# Patient Record
Sex: Male | Born: 1940
Health system: Southern US, Community
[De-identification: ages and names within clinical notes are randomized; demographics above are authoritative.]

## PROBLEM LIST (undated history)

## (undated) DIAGNOSIS — E1129 Type 2 diabetes mellitus with other diabetic kidney complication: Secondary | ICD-10-CM

## (undated) DIAGNOSIS — G629 Polyneuropathy, unspecified: Secondary | ICD-10-CM

## (undated) DIAGNOSIS — N184 Chronic kidney disease, stage 4 (severe): Secondary | ICD-10-CM

## (undated) DIAGNOSIS — Z9989 Dependence on other enabling machines and devices: Secondary | ICD-10-CM

## (undated) DIAGNOSIS — IMO0002 Reserved for concepts with insufficient information to code with codable children: Secondary | ICD-10-CM

## (undated) DIAGNOSIS — R0600 Dyspnea, unspecified: Secondary | ICD-10-CM

## (undated) DIAGNOSIS — M1990 Unspecified osteoarthritis, unspecified site: Secondary | ICD-10-CM

## (undated) DIAGNOSIS — G4733 Obstructive sleep apnea (adult) (pediatric): Secondary | ICD-10-CM

## (undated) DIAGNOSIS — T8859XA Other complications of anesthesia, initial encounter: Secondary | ICD-10-CM

## (undated) DIAGNOSIS — E669 Obesity, unspecified: Secondary | ICD-10-CM

## (undated) DIAGNOSIS — K5900 Constipation, unspecified: Secondary | ICD-10-CM

## (undated) DIAGNOSIS — E039 Hypothyroidism, unspecified: Secondary | ICD-10-CM

## (undated) DIAGNOSIS — M797 Fibromyalgia: Secondary | ICD-10-CM

## (undated) DIAGNOSIS — I82409 Acute embolism and thrombosis of unspecified deep veins of unspecified lower extremity: Secondary | ICD-10-CM

## (undated) DIAGNOSIS — I10 Essential (primary) hypertension: Secondary | ICD-10-CM

## (undated) DIAGNOSIS — T4145XA Adverse effect of unspecified anesthetic, initial encounter: Secondary | ICD-10-CM

## (undated) DIAGNOSIS — E1165 Type 2 diabetes mellitus with hyperglycemia: Secondary | ICD-10-CM

## (undated) DIAGNOSIS — R5382 Chronic fatigue, unspecified: Secondary | ICD-10-CM

## (undated) HISTORY — PX: THYROIDECTOMY, PARTIAL: SHX18

## (undated) HISTORY — PX: LUMBAR FUSION: SHX111

## (undated) HISTORY — PX: ROTATOR CUFF REPAIR: SHX139

## (undated) HISTORY — PX: CERVICAL LAMINECTOMY: SHX94

---

## 2011-09-24 DIAGNOSIS — E039 Hypothyroidism, unspecified: Secondary | ICD-10-CM | POA: Diagnosis not present

## 2011-09-24 DIAGNOSIS — E1149 Type 2 diabetes mellitus with other diabetic neurological complication: Secondary | ICD-10-CM | POA: Diagnosis not present

## 2011-09-24 DIAGNOSIS — N184 Chronic kidney disease, stage 4 (severe): Secondary | ICD-10-CM | POA: Diagnosis not present

## 2011-09-24 DIAGNOSIS — Z9641 Presence of insulin pump (external) (internal): Secondary | ICD-10-CM | POA: Diagnosis not present

## 2011-10-24 DIAGNOSIS — N184 Chronic kidney disease, stage 4 (severe): Secondary | ICD-10-CM | POA: Diagnosis not present

## 2011-11-19 DIAGNOSIS — N189 Chronic kidney disease, unspecified: Secondary | ICD-10-CM | POA: Diagnosis not present

## 2011-11-19 DIAGNOSIS — Z5181 Encounter for therapeutic drug level monitoring: Secondary | ICD-10-CM | POA: Diagnosis not present

## 2011-11-19 DIAGNOSIS — E119 Type 2 diabetes mellitus without complications: Secondary | ICD-10-CM | POA: Diagnosis not present

## 2011-11-19 DIAGNOSIS — I10 Essential (primary) hypertension: Secondary | ICD-10-CM | POA: Diagnosis not present

## 2011-11-19 DIAGNOSIS — Z79899 Other long term (current) drug therapy: Secondary | ICD-10-CM | POA: Diagnosis not present

## 2011-12-08 DIAGNOSIS — G4733 Obstructive sleep apnea (adult) (pediatric): Secondary | ICD-10-CM | POA: Diagnosis not present

## 2011-12-24 DIAGNOSIS — I10 Essential (primary) hypertension: Secondary | ICD-10-CM | POA: Diagnosis not present

## 2011-12-24 DIAGNOSIS — IMO0001 Reserved for inherently not codable concepts without codable children: Secondary | ICD-10-CM | POA: Diagnosis not present

## 2011-12-24 DIAGNOSIS — E782 Mixed hyperlipidemia: Secondary | ICD-10-CM | POA: Diagnosis not present

## 2012-01-21 DIAGNOSIS — E1149 Type 2 diabetes mellitus with other diabetic neurological complication: Secondary | ICD-10-CM | POA: Diagnosis not present

## 2012-01-21 DIAGNOSIS — L608 Other nail disorders: Secondary | ICD-10-CM | POA: Diagnosis not present

## 2012-01-23 DIAGNOSIS — N184 Chronic kidney disease, stage 4 (severe): Secondary | ICD-10-CM | POA: Diagnosis not present

## 2012-01-23 DIAGNOSIS — I129 Hypertensive chronic kidney disease with stage 1 through stage 4 chronic kidney disease, or unspecified chronic kidney disease: Secondary | ICD-10-CM | POA: Diagnosis not present

## 2012-01-23 DIAGNOSIS — E119 Type 2 diabetes mellitus without complications: Secondary | ICD-10-CM | POA: Diagnosis not present

## 2012-03-11 DIAGNOSIS — G4733 Obstructive sleep apnea (adult) (pediatric): Secondary | ICD-10-CM | POA: Diagnosis not present

## 2012-03-11 DIAGNOSIS — I10 Essential (primary) hypertension: Secondary | ICD-10-CM | POA: Diagnosis not present

## 2012-03-11 DIAGNOSIS — N189 Chronic kidney disease, unspecified: Secondary | ICD-10-CM | POA: Diagnosis not present

## 2012-03-24 DIAGNOSIS — E1129 Type 2 diabetes mellitus with other diabetic kidney complication: Secondary | ICD-10-CM | POA: Diagnosis not present

## 2012-03-24 DIAGNOSIS — E1165 Type 2 diabetes mellitus with hyperglycemia: Secondary | ICD-10-CM | POA: Diagnosis not present

## 2012-03-24 DIAGNOSIS — Z9641 Presence of insulin pump (external) (internal): Secondary | ICD-10-CM | POA: Diagnosis not present

## 2012-03-24 DIAGNOSIS — E039 Hypothyroidism, unspecified: Secondary | ICD-10-CM | POA: Diagnosis not present

## 2012-03-24 DIAGNOSIS — N184 Chronic kidney disease, stage 4 (severe): Secondary | ICD-10-CM | POA: Diagnosis not present

## 2012-04-09 DIAGNOSIS — H40009 Preglaucoma, unspecified, unspecified eye: Secondary | ICD-10-CM | POA: Diagnosis not present

## 2012-04-15 DIAGNOSIS — M545 Low back pain, unspecified: Secondary | ICD-10-CM | POA: Diagnosis not present

## 2012-04-15 DIAGNOSIS — M79609 Pain in unspecified limb: Secondary | ICD-10-CM | POA: Diagnosis not present

## 2012-04-15 DIAGNOSIS — M5137 Other intervertebral disc degeneration, lumbosacral region: Secondary | ICD-10-CM | POA: Diagnosis not present

## 2012-04-15 DIAGNOSIS — M48061 Spinal stenosis, lumbar region without neurogenic claudication: Secondary | ICD-10-CM | POA: Diagnosis not present

## 2012-04-15 DIAGNOSIS — M47817 Spondylosis without myelopathy or radiculopathy, lumbosacral region: Secondary | ICD-10-CM | POA: Diagnosis not present

## 2012-05-26 DIAGNOSIS — M545 Low back pain, unspecified: Secondary | ICD-10-CM | POA: Diagnosis not present

## 2012-05-26 DIAGNOSIS — E119 Type 2 diabetes mellitus without complications: Secondary | ICD-10-CM | POA: Diagnosis not present

## 2012-05-26 DIAGNOSIS — N189 Chronic kidney disease, unspecified: Secondary | ICD-10-CM | POA: Diagnosis not present

## 2012-05-26 DIAGNOSIS — J309 Allergic rhinitis, unspecified: Secondary | ICD-10-CM | POA: Diagnosis not present

## 2012-06-24 DIAGNOSIS — E1129 Type 2 diabetes mellitus with other diabetic kidney complication: Secondary | ICD-10-CM | POA: Diagnosis not present

## 2012-06-24 DIAGNOSIS — E1165 Type 2 diabetes mellitus with hyperglycemia: Secondary | ICD-10-CM | POA: Diagnosis not present

## 2012-06-24 DIAGNOSIS — E039 Hypothyroidism, unspecified: Secondary | ICD-10-CM | POA: Diagnosis not present

## 2012-06-24 DIAGNOSIS — E782 Mixed hyperlipidemia: Secondary | ICD-10-CM | POA: Diagnosis not present

## 2012-06-24 DIAGNOSIS — I10 Essential (primary) hypertension: Secondary | ICD-10-CM | POA: Diagnosis not present

## 2012-06-30 DIAGNOSIS — N184 Chronic kidney disease, stage 4 (severe): Secondary | ICD-10-CM | POA: Diagnosis not present

## 2012-07-07 DIAGNOSIS — N184 Chronic kidney disease, stage 4 (severe): Secondary | ICD-10-CM | POA: Diagnosis not present

## 2012-07-28 DIAGNOSIS — E1149 Type 2 diabetes mellitus with other diabetic neurological complication: Secondary | ICD-10-CM | POA: Diagnosis not present

## 2012-07-28 DIAGNOSIS — L608 Other nail disorders: Secondary | ICD-10-CM | POA: Diagnosis not present

## 2012-08-05 DIAGNOSIS — Z23 Encounter for immunization: Secondary | ICD-10-CM | POA: Diagnosis not present

## 2012-08-20 DIAGNOSIS — N184 Chronic kidney disease, stage 4 (severe): Secondary | ICD-10-CM | POA: Diagnosis not present

## 2012-08-27 DIAGNOSIS — E119 Type 2 diabetes mellitus without complications: Secondary | ICD-10-CM | POA: Diagnosis not present

## 2012-08-27 DIAGNOSIS — N184 Chronic kidney disease, stage 4 (severe): Secondary | ICD-10-CM | POA: Diagnosis not present

## 2012-08-27 DIAGNOSIS — I129 Hypertensive chronic kidney disease with stage 1 through stage 4 chronic kidney disease, or unspecified chronic kidney disease: Secondary | ICD-10-CM | POA: Diagnosis not present

## 2012-09-08 DIAGNOSIS — Z23 Encounter for immunization: Secondary | ICD-10-CM | POA: Diagnosis not present

## 2012-09-25 DIAGNOSIS — E782 Mixed hyperlipidemia: Secondary | ICD-10-CM | POA: Diagnosis not present

## 2012-09-25 DIAGNOSIS — E15 Nondiabetic hypoglycemic coma: Secondary | ICD-10-CM | POA: Diagnosis not present

## 2012-09-25 DIAGNOSIS — E1129 Type 2 diabetes mellitus with other diabetic kidney complication: Secondary | ICD-10-CM | POA: Diagnosis not present

## 2012-09-25 DIAGNOSIS — E1149 Type 2 diabetes mellitus with other diabetic neurological complication: Secondary | ICD-10-CM | POA: Diagnosis not present

## 2012-09-25 DIAGNOSIS — N184 Chronic kidney disease, stage 4 (severe): Secondary | ICD-10-CM | POA: Diagnosis not present

## 2012-09-25 DIAGNOSIS — I10 Essential (primary) hypertension: Secondary | ICD-10-CM | POA: Diagnosis not present

## 2012-09-25 DIAGNOSIS — E1165 Type 2 diabetes mellitus with hyperglycemia: Secondary | ICD-10-CM | POA: Diagnosis not present

## 2012-09-25 DIAGNOSIS — E039 Hypothyroidism, unspecified: Secondary | ICD-10-CM | POA: Diagnosis not present

## 2012-10-10 DIAGNOSIS — H2510 Age-related nuclear cataract, unspecified eye: Secondary | ICD-10-CM | POA: Diagnosis not present

## 2012-10-10 DIAGNOSIS — H18599 Other hereditary corneal dystrophies, unspecified eye: Secondary | ICD-10-CM | POA: Diagnosis not present

## 2012-10-10 DIAGNOSIS — H40009 Preglaucoma, unspecified, unspecified eye: Secondary | ICD-10-CM | POA: Diagnosis not present

## 2012-10-10 DIAGNOSIS — E109 Type 1 diabetes mellitus without complications: Secondary | ICD-10-CM | POA: Diagnosis not present

## 2012-11-17 DIAGNOSIS — N184 Chronic kidney disease, stage 4 (severe): Secondary | ICD-10-CM | POA: Diagnosis not present

## 2012-11-25 DIAGNOSIS — G4700 Insomnia, unspecified: Secondary | ICD-10-CM | POA: Diagnosis not present

## 2012-11-25 DIAGNOSIS — M545 Low back pain, unspecified: Secondary | ICD-10-CM | POA: Diagnosis not present

## 2012-11-25 DIAGNOSIS — E119 Type 2 diabetes mellitus without complications: Secondary | ICD-10-CM | POA: Diagnosis not present

## 2012-11-26 DIAGNOSIS — I129 Hypertensive chronic kidney disease with stage 1 through stage 4 chronic kidney disease, or unspecified chronic kidney disease: Secondary | ICD-10-CM | POA: Diagnosis not present

## 2012-11-26 DIAGNOSIS — N184 Chronic kidney disease, stage 4 (severe): Secondary | ICD-10-CM | POA: Diagnosis not present

## 2012-11-26 DIAGNOSIS — E119 Type 2 diabetes mellitus without complications: Secondary | ICD-10-CM | POA: Diagnosis not present

## 2012-12-25 DIAGNOSIS — E1149 Type 2 diabetes mellitus with other diabetic neurological complication: Secondary | ICD-10-CM | POA: Diagnosis not present

## 2012-12-25 DIAGNOSIS — N184 Chronic kidney disease, stage 4 (severe): Secondary | ICD-10-CM | POA: Diagnosis not present

## 2012-12-25 DIAGNOSIS — E039 Hypothyroidism, unspecified: Secondary | ICD-10-CM | POA: Diagnosis not present

## 2013-01-26 DIAGNOSIS — E1149 Type 2 diabetes mellitus with other diabetic neurological complication: Secondary | ICD-10-CM | POA: Diagnosis not present

## 2013-01-26 DIAGNOSIS — L608 Other nail disorders: Secondary | ICD-10-CM | POA: Diagnosis not present

## 2013-02-02 DIAGNOSIS — Z23 Encounter for immunization: Secondary | ICD-10-CM | POA: Diagnosis not present

## 2013-03-04 DIAGNOSIS — N184 Chronic kidney disease, stage 4 (severe): Secondary | ICD-10-CM | POA: Diagnosis not present

## 2013-03-11 DIAGNOSIS — N184 Chronic kidney disease, stage 4 (severe): Secondary | ICD-10-CM | POA: Diagnosis not present

## 2013-03-27 DIAGNOSIS — N052 Unspecified nephritic syndrome with diffuse membranous glomerulonephritis: Secondary | ICD-10-CM | POA: Diagnosis not present

## 2013-03-27 DIAGNOSIS — E15 Nondiabetic hypoglycemic coma: Secondary | ICD-10-CM | POA: Diagnosis not present

## 2013-03-27 DIAGNOSIS — Z9641 Presence of insulin pump (external) (internal): Secondary | ICD-10-CM | POA: Diagnosis not present

## 2013-03-27 DIAGNOSIS — N183 Chronic kidney disease, stage 3 unspecified: Secondary | ICD-10-CM | POA: Diagnosis not present

## 2013-03-27 DIAGNOSIS — E1165 Type 2 diabetes mellitus with hyperglycemia: Secondary | ICD-10-CM | POA: Diagnosis not present

## 2013-03-27 DIAGNOSIS — E109 Type 1 diabetes mellitus without complications: Secondary | ICD-10-CM | POA: Diagnosis not present

## 2013-03-27 DIAGNOSIS — E1129 Type 2 diabetes mellitus with other diabetic kidney complication: Secondary | ICD-10-CM | POA: Diagnosis not present

## 2013-04-17 DIAGNOSIS — H2510 Age-related nuclear cataract, unspecified eye: Secondary | ICD-10-CM | POA: Diagnosis not present

## 2013-04-17 DIAGNOSIS — H18599 Other hereditary corneal dystrophies, unspecified eye: Secondary | ICD-10-CM | POA: Diagnosis not present

## 2013-04-17 DIAGNOSIS — E109 Type 1 diabetes mellitus without complications: Secondary | ICD-10-CM | POA: Diagnosis not present

## 2013-04-17 DIAGNOSIS — H40009 Preglaucoma, unspecified, unspecified eye: Secondary | ICD-10-CM | POA: Diagnosis not present

## 2013-05-14 DIAGNOSIS — N189 Chronic kidney disease, unspecified: Secondary | ICD-10-CM | POA: Diagnosis not present

## 2013-05-14 DIAGNOSIS — E119 Type 2 diabetes mellitus without complications: Secondary | ICD-10-CM | POA: Diagnosis not present

## 2013-05-14 DIAGNOSIS — M479 Spondylosis, unspecified: Secondary | ICD-10-CM | POA: Diagnosis not present

## 2013-05-14 DIAGNOSIS — G4733 Obstructive sleep apnea (adult) (pediatric): Secondary | ICD-10-CM | POA: Diagnosis not present

## 2013-05-28 DIAGNOSIS — R5383 Other fatigue: Secondary | ICD-10-CM | POA: Diagnosis not present

## 2013-05-28 DIAGNOSIS — M479 Spondylosis, unspecified: Secondary | ICD-10-CM | POA: Diagnosis not present

## 2013-05-28 DIAGNOSIS — G8929 Other chronic pain: Secondary | ICD-10-CM | POA: Diagnosis not present

## 2013-05-28 DIAGNOSIS — R5381 Other malaise: Secondary | ICD-10-CM | POA: Diagnosis not present

## 2013-07-02 DIAGNOSIS — E1165 Type 2 diabetes mellitus with hyperglycemia: Secondary | ICD-10-CM | POA: Diagnosis not present

## 2013-07-02 DIAGNOSIS — N184 Chronic kidney disease, stage 4 (severe): Secondary | ICD-10-CM | POA: Diagnosis not present

## 2013-07-02 DIAGNOSIS — E039 Hypothyroidism, unspecified: Secondary | ICD-10-CM | POA: Diagnosis not present

## 2013-07-02 DIAGNOSIS — Z9641 Presence of insulin pump (external) (internal): Secondary | ICD-10-CM | POA: Diagnosis not present

## 2013-07-02 DIAGNOSIS — E1129 Type 2 diabetes mellitus with other diabetic kidney complication: Secondary | ICD-10-CM | POA: Diagnosis not present

## 2013-07-14 DIAGNOSIS — G8929 Other chronic pain: Secondary | ICD-10-CM | POA: Diagnosis not present

## 2013-07-14 DIAGNOSIS — Z79899 Other long term (current) drug therapy: Secondary | ICD-10-CM | POA: Diagnosis not present

## 2013-07-14 DIAGNOSIS — Z23 Encounter for immunization: Secondary | ICD-10-CM | POA: Diagnosis not present

## 2013-08-10 DIAGNOSIS — L608 Other nail disorders: Secondary | ICD-10-CM | POA: Diagnosis not present

## 2013-08-10 DIAGNOSIS — E1149 Type 2 diabetes mellitus with other diabetic neurological complication: Secondary | ICD-10-CM | POA: Diagnosis not present

## 2013-08-24 DIAGNOSIS — M479 Spondylosis, unspecified: Secondary | ICD-10-CM | POA: Diagnosis not present

## 2013-08-24 DIAGNOSIS — G8929 Other chronic pain: Secondary | ICD-10-CM | POA: Diagnosis not present

## 2013-09-18 DIAGNOSIS — N184 Chronic kidney disease, stage 4 (severe): Secondary | ICD-10-CM | POA: Diagnosis not present

## 2013-09-23 DIAGNOSIS — N184 Chronic kidney disease, stage 4 (severe): Secondary | ICD-10-CM | POA: Diagnosis not present

## 2013-09-23 DIAGNOSIS — E119 Type 2 diabetes mellitus without complications: Secondary | ICD-10-CM | POA: Diagnosis not present

## 2013-09-23 DIAGNOSIS — I129 Hypertensive chronic kidney disease with stage 1 through stage 4 chronic kidney disease, or unspecified chronic kidney disease: Secondary | ICD-10-CM | POA: Diagnosis not present

## 2013-10-01 DIAGNOSIS — I10 Essential (primary) hypertension: Secondary | ICD-10-CM | POA: Diagnosis not present

## 2013-10-01 DIAGNOSIS — N184 Chronic kidney disease, stage 4 (severe): Secondary | ICD-10-CM | POA: Diagnosis not present

## 2013-10-01 DIAGNOSIS — E1165 Type 2 diabetes mellitus with hyperglycemia: Secondary | ICD-10-CM | POA: Diagnosis not present

## 2013-10-01 DIAGNOSIS — E039 Hypothyroidism, unspecified: Secondary | ICD-10-CM | POA: Diagnosis not present

## 2013-10-01 DIAGNOSIS — E782 Mixed hyperlipidemia: Secondary | ICD-10-CM | POA: Diagnosis not present

## 2013-10-01 DIAGNOSIS — Z9641 Presence of insulin pump (external) (internal): Secondary | ICD-10-CM | POA: Diagnosis not present

## 2013-10-01 DIAGNOSIS — E1129 Type 2 diabetes mellitus with other diabetic kidney complication: Secondary | ICD-10-CM | POA: Diagnosis not present

## 2013-10-29 DIAGNOSIS — E039 Hypothyroidism, unspecified: Secondary | ICD-10-CM | POA: Diagnosis not present

## 2013-10-29 DIAGNOSIS — N183 Chronic kidney disease, stage 3 unspecified: Secondary | ICD-10-CM | POA: Diagnosis not present

## 2013-10-29 DIAGNOSIS — M5030 Other cervical disc degeneration, unspecified cervical region: Secondary | ICD-10-CM | POA: Diagnosis not present

## 2013-10-29 DIAGNOSIS — R5382 Chronic fatigue, unspecified: Secondary | ICD-10-CM | POA: Diagnosis not present

## 2013-10-29 DIAGNOSIS — M51379 Other intervertebral disc degeneration, lumbosacral region without mention of lumbar back pain or lower extremity pain: Secondary | ICD-10-CM | POA: Diagnosis not present

## 2013-10-29 DIAGNOSIS — G4733 Obstructive sleep apnea (adult) (pediatric): Secondary | ICD-10-CM | POA: Diagnosis not present

## 2013-10-29 DIAGNOSIS — G9332 Myalgic encephalomyelitis/chronic fatigue syndrome: Secondary | ICD-10-CM | POA: Diagnosis not present

## 2013-10-29 DIAGNOSIS — M5137 Other intervertebral disc degeneration, lumbosacral region: Secondary | ICD-10-CM | POA: Diagnosis not present

## 2013-10-29 DIAGNOSIS — I10 Essential (primary) hypertension: Secondary | ICD-10-CM | POA: Diagnosis not present

## 2013-10-29 DIAGNOSIS — E1129 Type 2 diabetes mellitus with other diabetic kidney complication: Secondary | ICD-10-CM | POA: Diagnosis not present

## 2014-01-11 DIAGNOSIS — I10 Essential (primary) hypertension: Secondary | ICD-10-CM | POA: Diagnosis not present

## 2014-01-11 DIAGNOSIS — R5381 Other malaise: Secondary | ICD-10-CM | POA: Diagnosis not present

## 2014-01-11 DIAGNOSIS — E1129 Type 2 diabetes mellitus with other diabetic kidney complication: Secondary | ICD-10-CM | POA: Diagnosis not present

## 2014-01-11 DIAGNOSIS — E039 Hypothyroidism, unspecified: Secondary | ICD-10-CM | POA: Diagnosis not present

## 2014-01-11 DIAGNOSIS — E1165 Type 2 diabetes mellitus with hyperglycemia: Secondary | ICD-10-CM | POA: Diagnosis not present

## 2014-01-11 DIAGNOSIS — R5383 Other fatigue: Secondary | ICD-10-CM | POA: Diagnosis not present

## 2014-03-25 DIAGNOSIS — N184 Chronic kidney disease, stage 4 (severe): Secondary | ICD-10-CM | POA: Diagnosis not present

## 2014-03-29 DIAGNOSIS — E1149 Type 2 diabetes mellitus with other diabetic neurological complication: Secondary | ICD-10-CM | POA: Diagnosis not present

## 2014-03-29 DIAGNOSIS — L608 Other nail disorders: Secondary | ICD-10-CM | POA: Diagnosis not present

## 2014-03-31 DIAGNOSIS — N2581 Secondary hyperparathyroidism of renal origin: Secondary | ICD-10-CM | POA: Diagnosis not present

## 2014-03-31 DIAGNOSIS — N390 Urinary tract infection, site not specified: Secondary | ICD-10-CM | POA: Diagnosis not present

## 2014-03-31 DIAGNOSIS — N184 Chronic kidney disease, stage 4 (severe): Secondary | ICD-10-CM | POA: Diagnosis not present

## 2014-03-31 DIAGNOSIS — E119 Type 2 diabetes mellitus without complications: Secondary | ICD-10-CM | POA: Diagnosis not present

## 2014-03-31 DIAGNOSIS — I129 Hypertensive chronic kidney disease with stage 1 through stage 4 chronic kidney disease, or unspecified chronic kidney disease: Secondary | ICD-10-CM | POA: Diagnosis not present

## 2014-04-12 DIAGNOSIS — E1129 Type 2 diabetes mellitus with other diabetic kidney complication: Secondary | ICD-10-CM | POA: Diagnosis not present

## 2014-04-12 DIAGNOSIS — E1165 Type 2 diabetes mellitus with hyperglycemia: Secondary | ICD-10-CM | POA: Diagnosis not present

## 2014-04-12 DIAGNOSIS — E039 Hypothyroidism, unspecified: Secondary | ICD-10-CM | POA: Diagnosis not present

## 2014-04-12 DIAGNOSIS — I10 Essential (primary) hypertension: Secondary | ICD-10-CM | POA: Diagnosis not present

## 2014-04-13 ENCOUNTER — Inpatient Hospital Stay (HOSPITAL_COMMUNITY): Payer: Medicare Other

## 2014-04-13 ENCOUNTER — Inpatient Hospital Stay (HOSPITAL_COMMUNITY)
Admission: EM | Admit: 2014-04-13 | Discharge: 2014-04-16 | DRG: 176 | Disposition: A | Discharge status: Home / Self Care | Payer: Medicare Other | Attending: Internal Medicine | Admitting: Internal Medicine

## 2014-04-13 ENCOUNTER — Emergency Department (HOSPITAL_COMMUNITY): Payer: Medicare Other

## 2014-04-13 ENCOUNTER — Encounter (HOSPITAL_COMMUNITY): Payer: Self-pay | Admitting: Emergency Medicine

## 2014-04-13 DIAGNOSIS — R7989 Other specified abnormal findings of blood chemistry: Secondary | ICD-10-CM

## 2014-04-13 DIAGNOSIS — R0902 Hypoxemia: Secondary | ICD-10-CM

## 2014-04-13 DIAGNOSIS — N184 Chronic kidney disease, stage 4 (severe): Secondary | ICD-10-CM | POA: Diagnosis not present

## 2014-04-13 DIAGNOSIS — E1129 Type 2 diabetes mellitus with other diabetic kidney complication: Secondary | ICD-10-CM | POA: Diagnosis present

## 2014-04-13 DIAGNOSIS — I824Z9 Acute embolism and thrombosis of unspecified deep veins of unspecified distal lower extremity: Secondary | ICD-10-CM | POA: Diagnosis not present

## 2014-04-13 DIAGNOSIS — I369 Nonrheumatic tricuspid valve disorder, unspecified: Secondary | ICD-10-CM | POA: Diagnosis not present

## 2014-04-13 DIAGNOSIS — I129 Hypertensive chronic kidney disease with stage 1 through stage 4 chronic kidney disease, or unspecified chronic kidney disease: Secondary | ICD-10-CM | POA: Diagnosis present

## 2014-04-13 DIAGNOSIS — IMO0002 Reserved for concepts with insufficient information to code with codable children: Secondary | ICD-10-CM

## 2014-04-13 DIAGNOSIS — I2699 Other pulmonary embolism without acute cor pulmonale: Secondary | ICD-10-CM | POA: Diagnosis not present

## 2014-04-13 DIAGNOSIS — G4733 Obstructive sleep apnea (adult) (pediatric): Secondary | ICD-10-CM | POA: Diagnosis present

## 2014-04-13 DIAGNOSIS — Z794 Long term (current) use of insulin: Secondary | ICD-10-CM | POA: Diagnosis not present

## 2014-04-13 DIAGNOSIS — R0602 Shortness of breath: Secondary | ICD-10-CM

## 2014-04-13 DIAGNOSIS — Z87891 Personal history of nicotine dependence: Secondary | ICD-10-CM | POA: Diagnosis not present

## 2014-04-13 DIAGNOSIS — Z9989 Dependence on other enabling machines and devices: Secondary | ICD-10-CM

## 2014-04-13 DIAGNOSIS — E1165 Type 2 diabetes mellitus with hyperglycemia: Secondary | ICD-10-CM | POA: Diagnosis present

## 2014-04-13 DIAGNOSIS — N179 Acute kidney failure, unspecified: Secondary | ICD-10-CM

## 2014-04-13 DIAGNOSIS — R5381 Other malaise: Secondary | ICD-10-CM | POA: Diagnosis present

## 2014-04-13 DIAGNOSIS — E118 Type 2 diabetes mellitus with unspecified complications: Secondary | ICD-10-CM | POA: Diagnosis present

## 2014-04-13 DIAGNOSIS — R5383 Other fatigue: Secondary | ICD-10-CM

## 2014-04-13 DIAGNOSIS — I82441 Acute embolism and thrombosis of right tibial vein: Secondary | ICD-10-CM

## 2014-04-13 DIAGNOSIS — R791 Abnormal coagulation profile: Secondary | ICD-10-CM | POA: Diagnosis not present

## 2014-04-13 DIAGNOSIS — Z6841 Body Mass Index (BMI) 40.0 and over, adult: Secondary | ICD-10-CM

## 2014-04-13 DIAGNOSIS — R918 Other nonspecific abnormal finding of lung field: Secondary | ICD-10-CM | POA: Diagnosis not present

## 2014-04-13 DIAGNOSIS — N390 Urinary tract infection, site not specified: Secondary | ICD-10-CM | POA: Diagnosis not present

## 2014-04-13 DIAGNOSIS — I10 Essential (primary) hypertension: Secondary | ICD-10-CM | POA: Diagnosis present

## 2014-04-13 HISTORY — DX: Obesity, unspecified: E66.9

## 2014-04-13 HISTORY — DX: Type 2 diabetes mellitus with hyperglycemia: E11.65

## 2014-04-13 HISTORY — DX: Chronic fatigue, unspecified: R53.82

## 2014-04-13 HISTORY — DX: Type 2 diabetes mellitus with other diabetic kidney complication: E11.29

## 2014-04-13 HISTORY — DX: Obstructive sleep apnea (adult) (pediatric): G47.33

## 2014-04-13 HISTORY — DX: Essential (primary) hypertension: I10

## 2014-04-13 HISTORY — DX: Chronic kidney disease, stage 4 (severe): N18.4

## 2014-04-13 HISTORY — DX: Reserved for concepts with insufficient information to code with codable children: IMO0002

## 2014-04-13 HISTORY — DX: Dependence on other enabling machines and devices: Z99.89

## 2014-04-13 HISTORY — DX: Other pulmonary embolism without acute cor pulmonale: I26.99

## 2014-04-13 HISTORY — DX: Hypoxemia: R09.02

## 2014-04-13 HISTORY — DX: Acute embolism and thrombosis of right tibial vein: I82.441

## 2014-04-13 LAB — GLUCOSE, CAPILLARY
Glucose-Capillary: 195 mg/dL — ABNORMAL HIGH (ref 70–99)
Glucose-Capillary: 211 mg/dL — ABNORMAL HIGH (ref 70–99)

## 2014-04-13 LAB — PRO B NATRIURETIC PEPTIDE: Pro B Natriuretic peptide (BNP): 11284 pg/mL — ABNORMAL HIGH (ref 0–125)

## 2014-04-13 LAB — BASIC METABOLIC PANEL
Anion gap: 16 — ABNORMAL HIGH (ref 5–15)
BUN: 39 mg/dL — ABNORMAL HIGH (ref 6–23)
CO2: 21 mEq/L (ref 19–32)
Calcium: 9.3 mg/dL (ref 8.4–10.5)
Chloride: 104 mEq/L (ref 96–112)
Creatinine, Ser: 2.58 mg/dL — ABNORMAL HIGH (ref 0.50–1.35)
GFR calc Af Amer: 27 mL/min — ABNORMAL LOW (ref >90)
GFR calc non Af Amer: 23 mL/min — ABNORMAL LOW (ref >90)
Glucose, Bld: 275 mg/dL — ABNORMAL HIGH (ref 70–99)
Potassium: 4.9 mEq/L (ref 3.7–5.3)
Sodium: 141 mEq/L (ref 137–147)

## 2014-04-13 LAB — CBC
HCT: 46.3 % (ref 39.0–52.0)
Hemoglobin: 15.6 g/dL (ref 13.0–17.0)
MCH: 29.6 pg (ref 26.0–34.0)
MCHC: 33.7 g/dL (ref 30.0–36.0)
MCV: 87.9 fL (ref 78.0–100.0)
Platelets: 154 10*3/uL (ref 150–400)
RBC: 5.27 MIL/uL (ref 4.22–5.81)
RDW: 14.4 % (ref 11.5–15.5)
WBC: 14.7 10*3/uL — ABNORMAL HIGH (ref 4.0–10.5)

## 2014-04-13 LAB — I-STAT TROPONIN, ED: Troponin i, poc: 0.05 ng/mL (ref 0.00–0.08)

## 2014-04-13 LAB — D-DIMER, QUANTITATIVE: D-Dimer, Quant: 11.61 ug/mL-FEU — ABNORMAL HIGH (ref 0.00–0.48)

## 2014-04-13 MED ORDER — DONEPEZIL HCL 5 MG PO TABS
5.0000 mg | ORAL_TABLET | Freq: Every day | ORAL | Status: DC
  Filled 2014-04-13: qty 1

## 2014-04-13 MED ORDER — TECHNETIUM TC 99M DIETHYLENETRIAME-PENTAACETIC ACID: 40.0000 | Freq: Once | INTRAVENOUS | Status: AC | PRN

## 2014-04-13 MED ORDER — HEPARIN (PORCINE) IN NACL 100-0.45 UNIT/ML-% IJ SOLN
1800.0000 [IU]/h | INTRAMUSCULAR | Status: DC
  Administered 2014-04-13 – 2014-04-14 (×2): 1800 [IU]/h via INTRAVENOUS
  Filled 2014-04-13 (×4): qty 250

## 2014-04-13 MED ORDER — HYDROCODONE-ACETAMINOPHEN 5-325 MG PO TABS
1.0000 | ORAL_TABLET | Freq: Four times a day (QID) | ORAL | Status: DC
  Administered 2014-04-13 – 2014-04-16 (×11): 1 via ORAL
  Filled 2014-04-13 (×11): qty 1

## 2014-04-13 MED ORDER — AZELASTINE-FLUTICASONE 137-50 MCG/ACT NA SUSP: 1.0000 | Freq: Two times a day (BID) | NASAL | Status: DC

## 2014-04-13 MED ORDER — FUROSEMIDE 10 MG/ML IJ SOLN
40.0000 mg | Freq: Once | INTRAMUSCULAR | Status: AC
  Administered 2014-04-13: 40 mg via INTRAVENOUS
  Filled 2014-04-13: qty 4

## 2014-04-13 MED ORDER — ACETAMINOPHEN 325 MG PO TABS: 650.0000 mg | ORAL_TABLET | Freq: Four times a day (QID) | ORAL | Status: DC | PRN

## 2014-04-13 MED ORDER — INSULIN PUMP
Freq: Three times a day (TID) | SUBCUTANEOUS | Status: DC
  Administered 2014-04-14: 1 via SUBCUTANEOUS
  Administered 2014-04-14: 7.5 via SUBCUTANEOUS
  Administered 2014-04-14: 5 via SUBCUTANEOUS
  Administered 2014-04-15 (×4): via SUBCUTANEOUS
  Administered 2014-04-16: 6 via SUBCUTANEOUS
  Administered 2014-04-16: 03:00:00 via SUBCUTANEOUS
  Filled 2014-04-13: qty 1

## 2014-04-13 MED ORDER — LEVOTHYROXINE SODIUM 200 MCG PO TABS
200.0000 ug | ORAL_TABLET | Freq: Every day | ORAL | Status: DC
  Administered 2014-04-14 – 2014-04-16 (×3): 200 ug via ORAL
  Filled 2014-04-13 (×4): qty 1

## 2014-04-13 MED ORDER — CLONAZEPAM 0.5 MG PO TABS: 2.0000 mg | ORAL_TABLET | Freq: Every day | ORAL | Status: DC

## 2014-04-13 MED ORDER — AZELASTINE HCL 0.1 % NA SOLN
1.0000 | Freq: Two times a day (BID) | NASAL | Status: DC
  Administered 2014-04-13 – 2014-04-16 (×6): 1 via NASAL
  Filled 2014-04-13: qty 30

## 2014-04-13 MED ORDER — LISINOPRIL 10 MG PO TABS
10.0000 mg | ORAL_TABLET | Freq: Every day | ORAL | Status: DC
  Administered 2014-04-14: 10 mg via ORAL
  Filled 2014-04-13: qty 1

## 2014-04-13 MED ORDER — TECHNETIUM TO 99M ALBUMIN AGGREGATED
6.0000 | Freq: Once | INTRAVENOUS | Status: AC | PRN
  Administered 2014-04-13: 6 via INTRAVENOUS

## 2014-04-13 MED ORDER — ATENOLOL 50 MG PO TABS
50.0000 mg | ORAL_TABLET | Freq: Two times a day (BID) | ORAL | Status: DC
  Administered 2014-04-13 – 2014-04-16 (×5): 50 mg via ORAL
  Filled 2014-04-13 (×7): qty 1

## 2014-04-13 MED ORDER — HYDROCHLOROTHIAZIDE 25 MG PO TABS
25.0000 mg | ORAL_TABLET | Freq: Every day | ORAL | Status: DC
  Administered 2014-04-14: 25 mg via ORAL
  Filled 2014-04-13: qty 1

## 2014-04-13 MED ORDER — HEPARIN BOLUS VIA INFUSION
5000.0000 [IU] | Freq: Once | INTRAVENOUS | Status: AC
  Administered 2014-04-13: 5000 [IU] via INTRAVENOUS
  Filled 2014-04-13: qty 5000

## 2014-04-13 MED ORDER — FLUTICASONE PROPIONATE 50 MCG/ACT NA SUSP
1.0000 | Freq: Two times a day (BID) | NASAL | Status: DC
  Administered 2014-04-13 – 2014-04-16 (×6): 1 via NASAL
  Filled 2014-04-13: qty 16

## 2014-04-13 MED ORDER — ONDANSETRON HCL 4 MG/2ML IJ SOLN: 4.0000 mg | Freq: Four times a day (QID) | INTRAMUSCULAR | Status: DC | PRN

## 2014-04-13 MED ORDER — SODIUM CHLORIDE 0.9 % IJ SOLN
3.0000 mL | Freq: Two times a day (BID) | INTRAMUSCULAR | Status: DC
  Administered 2014-04-13 – 2014-04-16 (×6): 3 mL via INTRAVENOUS

## 2014-04-13 MED ORDER — FAMOTIDINE 40 MG PO TABS
40.0000 mg | ORAL_TABLET | Freq: Every day | ORAL | Status: DC
  Administered 2014-04-14 – 2014-04-16 (×3): 40 mg via ORAL
  Filled 2014-04-13 (×3): qty 1

## 2014-04-13 MED ORDER — ONDANSETRON HCL 4 MG PO TABS: 4.0000 mg | ORAL_TABLET | Freq: Four times a day (QID) | ORAL | Status: DC | PRN

## 2014-04-13 MED ORDER — ALBUTEROL SULFATE (2.5 MG/3ML) 0.083% IN NEBU: 2.5000 mg | INHALATION_SOLUTION | RESPIRATORY_TRACT | Status: DC | PRN

## 2014-04-13 MED ORDER — ACETAMINOPHEN 650 MG RE SUPP: 650.0000 mg | Freq: Four times a day (QID) | RECTAL | Status: DC | PRN

## 2014-04-13 NOTE — ED Notes (Signed)
Heparin verified with Lanice Schwab., RN. Admitting physician at bedside.

## 2014-04-13 NOTE — ED Notes (Signed)
Attempted report. Family sent up to unit with pt belongings.

## 2014-04-13 NOTE — H&P (Signed)
History and Physical  Daniel Ochoa [id number] DOB: 02-04-1941 DOA: 04/13/2014  Referring physician: Jamse Mead, PA-C PCP: Antony Blackbird, MD  Outpatient Specialists:  1. Endocrinologist: Dr. Francetta Found in [place]. 2. Nephrologist: ? Dr. Lemar Livings in [place].  Chief Complaint: SOB  HPI: Daniel Ochoa is a 73 y.o. male with h/o DM2 with renal complications/on insulin pump, HTN, OSA on nightly CPAP, morbid obesity, stage IV chronic kidney disease, presents to the ED with complaints of worsening dyspnea. He was in his usual state of health until a week and a half ago when he started noticing dyspnea on ambulating across the room and back. This has not been associated with chest pain, cough, fever, chills, palpitations, dizziness, lightheadedness or palpitations. He denies leg edema or asymmetric swelling or pain of his legs. Since then his symptoms have progressively gotten worse where he gets dyspneic after taking a couple of steps. His symptoms are relieved with rest. He denies orthopnea or PND. He used to be able to go to the mailbox and his garage without difficulty which is now significantly limited. He last traveled to [place] and back in April 2015. He states that he has gained about 10 pounds in the last 3-4 months due to dietary indiscretion. He was seen by his PCP aware oxygen saturation was apparently 88-89% on room air. Patient was sent to the ED for further evaluation. In the ED, glucose 275, BUN 39, creatinine 2.58 (patient and his wife indicate that his baseline creatinine is 2.54), WBC 14.7, pro BNP 11284, positive d-dimer, chest x-ray without acute cardio pulmonary disease. Bilateral lower extremity DVT is positive for DVT noted in right posterior tibial and popliteal veins. Due to high index of suspicion for pulmonary embolism, he has been started on IV heparin infusion pending VQ scan which has been ordered. Patient denies history of congestive heart failure or COPD.  Hospitalist admission requested.   Review of Systems: All systems reviewed and apart from history of presenting illness, are negative.  Past Medical History  Diagnosis Date  . Obesity   . DM (diabetes mellitus), type 2, uncontrolled, with renal complications   . Chronic fatigue   . Essential hypertension   . OSA on CPAP   . Chronic kidney disease, stage IV (severe)    Past Surgical History  Procedure Laterality Date  . Back surgery      X4  . Right shoulder surgery Right   . Insulin pump placement Right    Social History:  reports that he has quit smoking. He does not have any smokeless tobacco history on file. He reports that he does not drink alcohol or use illicit drugs. Married. Uses cane as needed.  Allergies  Allergen Reactions  . Sulfa Antibiotics Rash    Family History  Problem Relation Age of Onset  . Cancer Father    Multiple family members demised from cancer.  Prior to Admission medications   Medication Sig Start Date End Date Taking? Authorizing Provider  atenolol (TENORMIN) 50 MG tablet Take 50 mg by mouth 2 (two) times daily.  01/31/14  Yes Historical Provider, MD  clonazePAM (KLONOPIN) 2 MG tablet Take 2 mg by mouth daily.  02/18/14  Yes Historical Provider, MD  donepezil (ARICEPT) 5 MG tablet Take 5 mg by mouth daily.  01/13/14  Yes Historical Provider, MD  DYMISTA 137-50 MCG/ACT SUSP Place 1 spray into both nostrils 2 (two) times daily.  01/13/14  Yes Historical Provider, MD  famotidine (PEPCID) 40 MG tablet  Take 40 mg by mouth daily.  03/17/14  Yes Historical Provider, MD  hydrochlorothiazide (HYDRODIURIL) 25 MG tablet Take 25 mg by mouth daily.  02/26/14  Yes Historical Provider, MD  HYDROcodone-acetaminophen (NORCO/VICODIN) 5-325 MG per tablet Take 1 tablet by mouth every 6 (six) hours.  03/17/14  Yes Historical Provider, MD  Insulin Human (INSULIN PUMP) SOLN Inject 1 each into the skin continuous. Humulin R   Yes Historical Provider, MD  lisinopril  (PRINIVIL,ZESTRIL) 10 MG tablet Take 10 mg by mouth daily.  01/13/14  Yes Historical Provider, MD  SYNTHROID 200 MCG tablet Take 200 mcg by mouth daily before breakfast.  01/12/14  Yes Historical Provider, MD  ciprofloxacin (CIPRO) 500 MG tablet Take 500 mg by mouth 2 (two) times daily. 7 day course (7/17-7/23) 04/02/14   Historical Provider, MD   Physical Exam: Filed Vitals:   04/13/14 1452 04/13/14 1601 04/13/14 1601 04/13/14 1602  BP: 144/76 127/88 120/89   Pulse:      Temp: 97.6 F (36.4 C)     TempSrc: Oral     Resp: 23     Weight:    147.419 kg (325 lb)  SpO2: 91%      pulse: 77 per minute.   General exam: Moderately built and morbidly obese male patient, lying comfortably propped up on the gurney in no obvious distress.  Head, eyes and ENT: Nontraumatic and normocephalic. Pupils equally reacting to light and accommodation. Oral mucosa moist.  Neck: Supple. No JVD, carotid bruit or thyromegaly.  Lymphatics: No lymphadenopathy.  Respiratory system: Clear to auscultation. No increased work of breathing.  Cardiovascular system: S1 and S2 heard, RRR. No JVD, murmurs, gallops, clicks. Trace bilateral ankle edema.  Gastrointestinal system: Abdomen is obese/nondistended, soft and nontender. Normal bowel sounds heard. No organomegaly or masses appreciated.  Central nervous system: Alert and oriented. No focal neurological deficits.  Extremities: Symmetric 5 x 5 power. Peripheral pulses symmetrically felt. Insulin pump over right upper arm. Surgical scar over right shoulder.  Skin: No rashes or acute findings.  Musculoskeletal system: Negative exam.  Psychiatry: Pleasant and cooperative.   Labs on Admission:  Basic Metabolic Panel:  Recent Labs Lab 04/13/14 1233  NA 141  K 4.9  CL 104  CO2 21  GLUCOSE 275*  BUN 39*  CREATININE 2.58*  CALCIUM 9.3   Liver Function Tests: No results found for this basename: AST, ALT, ALKPHOS, BILITOT, PROT, ALBUMIN,  in the last 168  hours No results found for this basename: LIPASE, AMYLASE,  in the last 168 hours No results found for this basename: AMMONIA,  in the last 168 hours CBC:  Recent Labs Lab 04/13/14 1233  WBC 14.7*  HGB 15.6  HCT 46.3  MCV 87.9  PLT 154   Cardiac Enzymes: No results found for this basename: CKTOTAL, CKMB, CKMBINDEX, TROPONINI,  in the last 168 hours  BNP (last 3 results)  Recent Labs  04/13/14 1233  PROBNP 11284.0*   CBG: No results found for this basename: GLUCAP,  in the last 168 hours  Radiological Exams on Admission: Dg Chest 2 View  04/13/2014   CLINICAL DATA:  Shortness of breath  EXAM: CHEST  2 VIEW  COMPARISON:  None.  FINDINGS: The cardiac and mediastinal silhouettes are stable in size and contour, and remain within normal limits.  The lungs are mildly hypoinflated. No airspace consolidation, pleural effusion, or pulmonary edema is identified. There is no pneumothorax.  No acute osseous abnormality identified. Surgical clips overlie the region  of the right thyroid lobe.  IMPRESSION: No active cardiopulmonary disease.   Electronically Signed   By: Jeannine Boga M.D.   On: 04/13/2014 13:41    EKG: Independently reviewed. Normal sinus rhythm, normal axis. Diffuse mild ST depression, most prominent in inferior leads. No prior EKGs to compare.  Assessment/Plan Principal Problem:   PE (pulmonary embolism) Active Problems:   Hypoxia   DM (diabetes mellitus), type 2, uncontrolled, with renal complications   Essential hypertension   OSA on CPAP   Chronic kidney disease, stage IV (severe)   Acute deep vein thrombosis (DVT) of tibial vein of right lower extremity   1. Dyspnea/mild hypoxia/suspicious for pulmonary embolism: Right lower extremity positive for DVT. Admit to telemetry. Followup VQ scan. Continue IV heparin per pharmacy. Will obtain 2-D echo to evaluate LV and RV function-strain. Reevaluate in a.m. to decide choice of oral anticoagulants based on followup  BMP-Coumadin versus NOAC/Eliquis. Hemodynamically stable. Will need anticoagulation for at least 6 months. Other DD-acute diastolic CHF, OSA/OHS. Trial of Lasix 40 mg IV x1 2. Right lower extremity DVT: Gives history of recent long-distance travel in April. No personal or family history of VTE. Not physically very active. Management as per problem #1. 3. Uncontrolled type II DM/IDDM: Continue insulin pump. Diabetes coordinator consulted. If his CBGs continue to be persistently significantly elevated, patient may have to come off insulin pump and treat with maintenance subcutaneous insulin, mealtime NovoLog and SSI. Follows up with an endocrinologist in [place]. 4. Hypertension: Controlled. Continue home dose of atenolol and lisinopril. 5. OSA: Continue nightly CPAP. Counseled regarding weight loss and diet. 6. Stage IV chronic kidney disease: Possibly from diabetic nephropathy. Patient and spouse state that baseline creatinine is 2.54 and apparently has been trending down. Follows up with a nephrologist in [place]. 7. Morbid obesity  Code Status: Full  Family Communication: Discussed with spouse at bedside.  Disposition Plan: Home when medically stable.   Time spent: 56 minutes  HONGALGI,ANAND, MD, FACP, FHM. [facility] Pager [phone]  If 7PM-7AM, please contact night-coverage [email or web address] Password [facility] 04/13/2014, 5:08 PM

## 2014-04-13 NOTE — ED Notes (Signed)
Pt has insulin pump in place. Pt placed on 2l o2 related to spo2 88 on ra. Speaking in complete sentences at this time.

## 2014-04-13 NOTE — Progress Notes (Signed)
Bilateral lower extremity venous duplex completed.  Right:  DVT noted posterior tibial and popliteal veins.  No evidence of superficial thrombosis.  No Baker's cyst.  Left:  No evidence of DVT, superficial thrombosis, or Baker's cyst.  Technically difficult study due to the patient's body habitus.

## 2014-04-13 NOTE — Progress Notes (Signed)
ANTICOAGULATION CONSULT NOTE - Initial Consult  Pharmacy Consult for heparin Indication: r/o PE, DVT  Allergies  Allergen Reactions  . Sulfa Antibiotics Rash    Patient Measurements: Weight: 325 lb (147.419 kg) Heparin Dosing Weight: 114kg  Vital Signs: Temp: 97.6 F (36.4 C) (07/28 1452) Temp src: Oral (07/28 1452) BP: 120/89 mmHg (07/28 1601) Pulse Rate: 92 (07/28 1430)  Labs:  Recent Labs  04/13/14 1233  HGB 15.6  HCT 46.3  PLT 154  CREATININE 2.58*    CrCl is unknown because there is no height on file for the current visit.   Medical History: Past Medical History  Diagnosis Date  . Obesity   . Diabetes mellitus without complication   . Chronic fatigue     Medications:  Infusions:  . heparin    . heparin      Assessment: 67 yom presented to the ED with SOB. D-dimer is elevated and found to have a DVT in RLE. Planning VQ scan for r/o PE. He is not on any anticoagulation PTA. Baseline H/H and plts are WNL. Pts states height/weight = 73in/325lbs.   Goal of Therapy:  Heparin level 0.3-0.7 units/ml Monitor platelets by anticoagulation protocol: Yes   Plan:  1. Heparin bolus 5000 units IV x 1 2. Heparin gtt 1800 units/hr 3. Check an 8 hour heparin level 4. Daily heparin level and CBC 5. F/u plans for oral AC  Daniel Ochoa, Rande Lawman 04/13/2014,4:15 PM

## 2014-04-13 NOTE — Progress Notes (Signed)
Pt arrived to room 3e18.  Pt VS stable, tele placed, pt SOB with minimal exertion.  Pt positive DVT in right leg, pt kept in bed.  Pt has no complaints, will continue to monitor.

## 2014-04-13 NOTE — ED Notes (Signed)
Pt reports having sob x 1 week, gets worse with exertion. Denies swelling to extremities. Denies cp. Pt thinks its a reaction to his meds. Airway intact, ekg done at triage.

## 2014-04-13 NOTE — ED Provider Notes (Signed)
CSN: [id number]     Arrival date & time 04/13/14  1200 History   First MD Initiated Contact with Patient 04/13/14 1342     Chief Complaint  Patient presents with  . Shortness of Breath     (Consider location/radiation/quality/duration/timing/severity/associated sxs/prior Treatment) The history is provided by the patient. No language interpreter was used.  Daniel Ochoa is a 73 y/o M with PMHx of DM, chronic fatigue and obesity presenting to the ED with increased shortness of breath that has been ongoing for the past week with worsening symptoms over the past couple of days. Patient reported that at first the shortness of breath occurred with exacerbation - reported that now the shortness of breath happens more quickly with motion and has been occuring while at rest. Patient reported that he has been diaphoretic more so with activity - going to get his coffee and bring it back into the living room - reported that when he does this once simple task he has to sit down and catch his breath for a couple of minutes. Patient reported that he was seen and assessed by his attending physician earlier today and recommended to come into the ED today to be further assessed. Patient reported that while in the office his pulse ox was approximately 88-89% on room air - denied use of oxygen therapy. Patient reported that he has noticed a 10 pound weight gain over the course of less than 3 months. Stated that he uses a CPAP at night secondary to sleep apnea. Denied chest pain, weakness, numbness, tingling, leg swelling, wheezing, abdominal pain, nausea, vomiting, diarrhea, fever, chills, cough, nasal congestion, long traveling, hemoptysis. PCP Dr. Chapman Fitch  Past Medical History  Diagnosis Date  . Obesity   . DM (diabetes mellitus), type 2, uncontrolled, with renal complications   . Chronic fatigue   . Essential hypertension   . OSA on CPAP   . Chronic kidney disease, stage IV (severe)    Past Surgical History   Procedure Laterality Date  . Back surgery      X4  . Right shoulder surgery Right   . Insulin pump placement Right    Family History  Problem Relation Age of Onset  . Cancer Father    History  Substance Use Topics  . Smoking status: Former Research scientist (life sciences)  . Smokeless tobacco: Not on file  . Alcohol Use: No    Review of Systems  Constitutional: Positive for diaphoresis. Negative for fever and chills.  HENT: Negative for congestion.   Respiratory: Positive for shortness of breath. Negative for cough, chest tightness and wheezing.   Cardiovascular: Negative for chest pain and leg swelling.  Gastrointestinal: Negative for nausea, vomiting, abdominal pain and diarrhea.  Neurological: Negative for dizziness, weakness and numbness.      Allergies  Sulfa antibiotics  Home Medications   Prior to Admission medications   Medication Sig Start Date End Date Taking? Authorizing Provider  atenolol (TENORMIN) 50 MG tablet Take 50 mg by mouth 2 (two) times daily.  01/31/14  Yes Historical Provider, MD  clonazePAM (KLONOPIN) 2 MG tablet Take 2 mg by mouth daily.  02/18/14  Yes Historical Provider, MD  donepezil (ARICEPT) 5 MG tablet Take 5 mg by mouth daily.  01/13/14  Yes Historical Provider, MD  DYMISTA 137-50 MCG/ACT SUSP Place 1 spray into both nostrils 2 (two) times daily.  01/13/14  Yes Historical Provider, MD  famotidine (PEPCID) 40 MG tablet Take 40 mg by mouth daily.  03/17/14  Yes Historical  Provider, MD  hydrochlorothiazide (HYDRODIURIL) 25 MG tablet Take 25 mg by mouth daily.  02/26/14  Yes Historical Provider, MD  HYDROcodone-acetaminophen (NORCO/VICODIN) 5-325 MG per tablet Take 1 tablet by mouth every 6 (six) hours.  03/17/14  Yes Historical Provider, MD  Insulin Human (INSULIN PUMP) SOLN Inject 1 each into the skin continuous. Humulin R   Yes Historical Provider, MD  lisinopril (PRINIVIL,ZESTRIL) 10 MG tablet Take 10 mg by mouth daily.  01/13/14  Yes Historical Provider, MD  SYNTHROID 200  MCG tablet Take 200 mcg by mouth daily before breakfast.  01/12/14  Yes Historical Provider, MD  ciprofloxacin (CIPRO) 500 MG tablet Take 500 mg by mouth 2 (two) times daily. 7 day course (7/17-7/23) 04/02/14   Historical Provider, MD   BP 120/89  Pulse 77  Temp(Src) 97.6 F (36.4 C) (Oral)  Resp 16  Wt 325 lb (147.419 kg)  SpO2 93% Physical Exam  Nursing note and vitals reviewed. Constitutional: He is oriented to person, place, and time. He appears well-developed and well-nourished. No distress.  HENT:  Head: Normocephalic and atraumatic.  Mouth/Throat: Oropharynx is clear and moist. No oropharyngeal exudate.  Eyes: Conjunctivae and EOM are normal. Pupils are equal, round, and reactive to light. Right eye exhibits no discharge. Left eye exhibits no discharge.  Neck: Normal range of motion. Neck supple. No tracheal deviation present.  Cardiovascular: Normal rate, regular rhythm and normal heart sounds.   Cap refill < 3 seconds Negative swelling or pitting edema noted to the lower extremities bilaterally   Pulmonary/Chest: Effort normal. No respiratory distress. He has no wheezes. He has no rales. He exhibits no tenderness.  Patient is able to speak in full sentences without difficulty Negative use of accessory muscles Negative stridor Decreased breath sounds bilaterally to upper and lower loops-good lung expansion noted  Abdominal: Soft. Bowel sounds are normal. He exhibits no distension. There is no tenderness. There is no rebound and no guarding.  Negative abdominal distention Bowel sounds normoactive in all 4 quadrants Abdomen soft upon palpation Negative fluid wave  Musculoskeletal: Normal range of motion.  Full ROM to upper and lower extremities without difficulty noted, negative ataxia noted.  Lymphadenopathy:    He has no cervical adenopathy.  Neurological: He is alert and oriented to person, place, and time. No cranial nerve deficit. He exhibits normal muscle tone.  Coordination normal.  Skin: Skin is warm and dry. No rash noted. He is not diaphoretic. No erythema.  Psychiatric: He has a normal mood and affect. His behavior is normal. Thought content normal.    ED Course  Procedures (including critical care time)  4:11 PM This provider spoke with Dr. Algis Liming, [facility] - discussed case, history, labs, imaging in great detail. Patient to be admitted to Telemetry as in patient. Dr. Lavella Lemons agreed to Heparin to be started on patient. Recommended VQ scan to be performed.   Results for orders placed during the hospital encounter of 04/13/14  CBC      Result Value Ref Range   WBC 14.7 (*) 4.0 - 10.5 K/uL   RBC 5.27  4.22 - 5.81 MIL/uL   Hemoglobin 15.6  13.0 - 17.0 g/dL   HCT 46.3  39.0 - 52.0 %   MCV 87.9  78.0 - 100.0 fL   MCH 29.6  26.0 - 34.0 pg   MCHC 33.7  30.0 - 36.0 g/dL   RDW 14.4  11.5 - 15.5 %   Platelets 154  150 - 400 K/uL  BASIC  METABOLIC PANEL      Result Value Ref Range   Sodium 141  137 - 147 mEq/L   Potassium 4.9  3.7 - 5.3 mEq/L   Chloride 104  96 - 112 mEq/L   CO2 21  19 - 32 mEq/L   Glucose, Bld 275 (*) 70 - 99 mg/dL   BUN 39 (*) 6 - 23 mg/dL   Creatinine, Ser 2.58 (*) 0.50 - 1.35 mg/dL   Calcium 9.3  8.4 - 10.5 mg/dL   GFR calc non Af Amer 23 (*) >90 mL/min   GFR calc Af Amer 27 (*) >90 mL/min   Anion gap 16 (*) 5 - 15  PRO B NATRIURETIC PEPTIDE      Result Value Ref Range   Pro B Natriuretic peptide (BNP) 11284.0 (*) 0 - 125 pg/mL  D-DIMER, QUANTITATIVE      Result Value Ref Range   D-Dimer, Quant 11.61 (*) 0.00 - 0.48 ug/mL-FEU  I-STAT TROPOININ, ED      Result Value Ref Range   Troponin i, poc 0.05  0.00 - 0.08 ng/mL   Comment 3             Labs Review Labs Reviewed  CBC - Abnormal; Notable for the following:    WBC 14.7 (*)    All other components within normal limits  BASIC METABOLIC PANEL - Abnormal; Notable for the following:    Glucose, Bld 275 (*)    BUN 39 (*)    Creatinine, Ser 2.58 (*)     GFR calc non Af Amer 23 (*)    GFR calc Af Amer 27 (*)    Anion gap 16 (*)    All other components within normal limits  PRO B NATRIURETIC PEPTIDE - Abnormal; Notable for the following:    Pro B Natriuretic peptide (BNP) 11284.0 (*)    All other components within normal limits  D-DIMER, QUANTITATIVE - Abnormal; Notable for the following:    D-Dimer, Quant 11.61 (*)    All other components within normal limits  URINALYSIS, ROUTINE W REFLEX MICROSCOPIC  HEPARIN LEVEL (UNFRACTIONATED)  CBC  I-STAT TROPOININ, ED    Imaging Review Dg Chest 2 View  04/13/2014   CLINICAL DATA:  Shortness of breath  EXAM: CHEST  2 VIEW  COMPARISON:  None.  FINDINGS: The cardiac and mediastinal silhouettes are stable in size and contour, and remain within normal limits.  The lungs are mildly hypoinflated. No airspace consolidation, pleural effusion, or pulmonary edema is identified. There is no pneumothorax.  No acute osseous abnormality identified. Surgical clips overlie the region of the right thyroid lobe.  IMPRESSION: No active cardiopulmonary disease.   Electronically Signed   By: Jeannine Boga M.D.   On: 04/13/2014 13:41     EKG Interpretation   Date/Time:  Tuesday April 13 2014 12:07:14 EDT Ventricular Rate:  96 PR Interval:  168 QRS Duration: 90 QT Interval:  378 QTC Calculation: 477 R Axis:   12 Text Interpretation:  Normal sinus rhythm Nonspecific ST abnormality  Abnormal ECG No significant change was found Confirmed by CAMPOS  MD,  Lennette Bihari ([id number]) on 04/13/2014 2:41:17 PM      MDM   Final diagnoses:  Shortness of breath  Elevated d-dimer  Hypoxia  Acute renal failure, unspecified acute renal failure type   Medications  heparin ADULT infusion 100 units/mL (25000 units/250 mL) (1,800 Units/hr Intravenous New Bag/Given 04/13/14 1631)  insulin pump (not administered)  heparin bolus via infusion 5,000 Units (  0 Units Intravenous Stopped 04/13/14 1704)    Filed Vitals:   04/13/14  1600 04/13/14 1601 04/13/14 1601 04/13/14 1602  BP: 120/89 127/88 120/89   Pulse: 77     Temp:      TempSrc:      Resp: 16     Weight:    325 lb (147.419 kg)  SpO2: 93%      EKG noted normal sinus rhythm with a heart rate of 96 beats per minute-nonspecific ST abnormalities noted-no significant change was found. Troponin negative elevation. D-dimer 11.61. BMP noted elevation of 11,284.0. CBC noted mildly elevated white blood cell count of 14.7. BMP noted elevated BUN of 39, creatinine 2.58-appears to be in acute renal failure. Glucose of 275 with an anion gap of 16.0 mEq per liter. X-ray negative for acute cardiopulmonary disease - negative widened mediastinum. Doppler of bilateral lower extremities identified blood clot x 2 of the right lower extremity - acute posterior tibial and popliteal veins.  Discussed case with attending physician who recommended patient to be started on Heparin and for patient to be admitted to Medicine secondary to high suspicion of PE. Discussed case with attending physician, Dr. Marylene Buerger - doubt aortic dissection. Patient started on IV heparin as per pharmacy consult while in the ED setting. Patient placed on oxygen therapy while in the ED setting, 2 L/min - pulse ox 91%.  Blood pressures on bilateral arms equal and no widened mediastinum on chest xray - doubt dissection. High suspicion of PE secondary to elevated d-dimer, hypoxia, and acute DVT of the RLE in the popliteal and tibial regions. Patient placed on oxygen therapy. Started on IV heparin in the ED setting. VQ scan ordered. Elevated BUN and Creatinine - Creatinine elevated at 2.58 - cannot rule out possible beginnings of acute renal failure no creatinine to compare to. BNP elevated, can be possible beginnings of CHF, patient will most likely need echo to be performed. Elevated BNP can be due to elevated creatinine. Patient to be admitted to the hospital to Telemetry services. Patient agreed to plan of admission.  Patient stable for transfer.   Jamse Mead, PA-C 04/13/14 1750

## 2014-04-13 NOTE — Progress Notes (Signed)
Pt does not wish to wear CPAP/Bipap tonight. Wife and pt stated he has one in the car but he feels he is okay tonight without one. RT advised that pt wear CPAP but pt still decided that he did not want to wear tonight. Pt encouraged to call RT if pt changes mind and needs a hospital machine. No distress noted at this time.

## 2014-04-14 DIAGNOSIS — I369 Nonrheumatic tricuspid valve disorder, unspecified: Secondary | ICD-10-CM

## 2014-04-14 DIAGNOSIS — R0602 Shortness of breath: Secondary | ICD-10-CM

## 2014-04-14 LAB — GLUCOSE, CAPILLARY
Glucose-Capillary: 103 mg/dL — ABNORMAL HIGH (ref 70–99)
Glucose-Capillary: 99 mg/dL (ref 70–99)
Glucose-Capillary: 102 mg/dL — ABNORMAL HIGH (ref 70–99)
Glucose-Capillary: 54 mg/dL — ABNORMAL LOW (ref 70–99)
Glucose-Capillary: 71 mg/dL (ref 70–99)
Glucose-Capillary: 94 mg/dL (ref 70–99)

## 2014-04-14 LAB — PROTIME-INR
INR: 1.13 (ref 0.00–1.49)
Prothrombin Time: 14.5 seconds (ref 11.6–15.2)

## 2014-04-14 LAB — URINALYSIS, ROUTINE W REFLEX MICROSCOPIC
Bilirubin Urine: NEGATIVE
Glucose, UA: NEGATIVE mg/dL
Hgb urine dipstick: NEGATIVE
Ketones, ur: NEGATIVE mg/dL
Leukocytes, UA: NEGATIVE
Nitrite: NEGATIVE
Protein, ur: 100 mg/dL — AB
Specific Gravity, Urine: 1.018 (ref 1.005–1.030)
Urobilinogen, UA: 0.2 mg/dL (ref 0.0–1.0)
pH: 5 (ref 5.0–8.0)

## 2014-04-14 LAB — BASIC METABOLIC PANEL
Anion gap: 20 — ABNORMAL HIGH (ref 5–15)
BUN: 43 mg/dL — ABNORMAL HIGH (ref 6–23)
CO2: 20 mEq/L (ref 19–32)
Calcium: 9.1 mg/dL (ref 8.4–10.5)
Chloride: 104 mEq/L (ref 96–112)
Creatinine, Ser: 2.94 mg/dL — ABNORMAL HIGH (ref 0.50–1.35)
GFR calc Af Amer: 23 mL/min — ABNORMAL LOW (ref >90)
GFR calc non Af Amer: 20 mL/min — ABNORMAL LOW (ref >90)
Glucose, Bld: 110 mg/dL — ABNORMAL HIGH (ref 70–99)
Potassium: 4.5 mEq/L (ref 3.7–5.3)
Sodium: 144 mEq/L (ref 137–147)

## 2014-04-14 LAB — CBC
HCT: 44.8 % (ref 39.0–52.0)
Hemoglobin: 15 g/dL (ref 13.0–17.0)
MCH: 29.6 pg (ref 26.0–34.0)
MCHC: 33.5 g/dL (ref 30.0–36.0)
MCV: 88.4 fL (ref 78.0–100.0)
Platelets: 182 10*3/uL (ref 150–400)
RBC: 5.07 MIL/uL (ref 4.22–5.81)
RDW: 14.6 % (ref 11.5–15.5)
WBC: 14.8 10*3/uL — ABNORMAL HIGH (ref 4.0–10.5)

## 2014-04-14 LAB — HEPARIN LEVEL (UNFRACTIONATED)
Heparin Unfractionated: 0.38 IU/mL (ref 0.30–0.70)
Heparin Unfractionated: 0.43 IU/mL (ref 0.30–0.70)

## 2014-04-14 LAB — HEMOGLOBIN A1C
Hgb A1c MFr Bld: 7.1 % — ABNORMAL HIGH (ref <5.7)
Mean Plasma Glucose: 157 mg/dL — ABNORMAL HIGH (ref <117)

## 2014-04-14 LAB — TSH: TSH: 2.14 u[IU]/mL (ref 0.350–4.500)

## 2014-04-14 LAB — URINE MICROSCOPIC-ADD ON

## 2014-04-14 MED ORDER — CLONAZEPAM 0.5 MG PO TABS
2.0000 mg | ORAL_TABLET | Freq: Every day | ORAL | Status: DC
  Administered 2014-04-14 – 2014-04-15 (×2): 2 mg via ORAL
  Filled 2014-04-14 (×2): qty 4

## 2014-04-14 MED ORDER — DONEPEZIL HCL 5 MG PO TABS
5.0000 mg | ORAL_TABLET | Freq: Every day | ORAL | Status: DC
  Administered 2014-04-14 – 2014-04-15 (×2): 5 mg via ORAL
  Filled 2014-04-14 (×3): qty 1

## 2014-04-14 MED ORDER — WARFARIN SODIUM 7.5 MG PO TABS
7.5000 mg | ORAL_TABLET | Freq: Once | ORAL | Status: AC
  Administered 2014-04-14: 7.5 mg via ORAL
  Filled 2014-04-14: qty 1

## 2014-04-14 MED ORDER — SODIUM CHLORIDE 0.9 % IV SOLN
INTRAVENOUS | Status: AC
  Administered 2014-04-14 – 2014-04-15 (×2): via INTRAVENOUS

## 2014-04-14 MED ORDER — COUMADIN BOOK
Freq: Once | Status: AC
  Administered 2014-04-14: 1
  Filled 2014-04-14: qty 1

## 2014-04-14 MED ORDER — WARFARIN VIDEO: Freq: Once | Status: DC

## 2014-04-14 MED ORDER — WARFARIN - PHARMACIST DOSING INPATIENT: Freq: Every day | Status: DC

## 2014-04-14 MED ORDER — ENOXAPARIN SODIUM 150 MG/ML ~~LOC~~ SOLN
140.0000 mg | Freq: Two times a day (BID) | SUBCUTANEOUS | Status: DC
  Administered 2014-04-14 – 2014-04-16 (×5): 140 mg via SUBCUTANEOUS
  Filled 2014-04-14 (×6): qty 1

## 2014-04-14 NOTE — Progress Notes (Signed)
Inpatient Diabetes Program Recommendations  AACE/ADA: New Consensus Statement on Inpatient Glycemic Control (2013)  Target Ranges:  Prepandial:   less than 140 mg/dL      Peak postprandial:   less than 180 mg/dL (1-2 hours)      Critically ill patients:  140 - 180 mg/dL   Reason for Visit: Diabetes Consult  Diabetes history: DM2 Outpatient Diabetes medications: Insulin Pump (OmniPod) Current orders for Inpatient glycemic control: Insulin Pump   73 y.o. male with h/o DM2 with renal complications/on insulin pump, HTN, OSA on nightly CPAP, morbid obesity, stage IV chronic kidney disease, presents to the ED with complaints of worsening dyspnea, positive for PE. Has Omnipod insulin pump for past 4 1/2 years. Recently moved to [place] from [place].   Pump settings:  Basal - 12 MN - 0800 - 0.35 units 0800 - 12 noon - .045 units 12 noon - 12 MN - 0.4 units  CF - 60 CHO ratio: 1:8  Blood sugars doing well. Will follow while inpatient. Documentation for Insulin Pump in DocFlowsheets.  To f/u with endo in [place]. Thank you. Lorenda Peck, RD, LDN, CDE Inpatient Diabetes Coordinator [phone]

## 2014-04-14 NOTE — Progress Notes (Signed)
[facility] PROGRESS NOTE  Daniel Ochoa [id number] DOB: [birth year] DOA: 04/13/2014  PCP: Antony Blackbird, MD  Brief HPI: 73yo male presented with shortness of breath. Was noted to be mildly hypoxic. Was found to have LE DVT and VQ scan was intermediate probability for PE.  Past medical history:  Past Medical History  Diagnosis Date  . Obesity   . DM (diabetes mellitus), type 2, uncontrolled, with renal complications   . Chronic fatigue   . Essential hypertension   . OSA on CPAP   . Chronic kidney disease, stage IV (severe)    Consultants:  None  Procedures:  LE Venous Dopplers: Right LE Acute DVT  2D ECHO: Pending  Antibiotics: None  Subjective: Patient states breathing is better. Denies chest pain. Hasn't ambulated yet.  Objective: Vital Signs  Filed Vitals:   04/13/14 2113 04/14/14 0204 04/14/14 0536 04/14/14 0949  BP: 113/71 125/66 119/65 122/91  Pulse: 72 62 59 53  Temp: 97.6 F (36.4 C) 97.6 F (36.4 C) 97.3 F (36.3 C)   TempSrc: Oral Oral Oral   Resp: 18 18 18    Height:      Weight:   142.475 kg (314 lb 1.6 oz)   SpO2: 97% 97% 97%     Intake/Output Summary (Last 24 hours) at 04/14/14 1143 Last data filed at 04/14/14 0834  Gross per 24 hour  Intake    240 ml  Output    600 ml  Net   -360 ml   Filed Weights   04/13/14 1602 04/13/14 1815 04/14/14 0536  Weight: 147.419 kg (325 lb) 141.295 kg (311 lb 8 oz) 142.475 kg (314 lb 1.6 oz)   General appearance: alert, cooperative, no distress and moderately obese Head: Normocephalic, without obvious abnormality, atraumatic Neck: no adenopathy, no carotid bruit, no JVD, supple, symmetrical, trachea midline and thyroid not enlarged, symmetric, no tenderness/mass/nodules Resp: clear to auscultation bilaterally Cardio: regular rate and rhythm, S1, S2 normal, no murmur, click, rub or gallop GI: soft, non-tender; bowel sounds normal; no masses,  no organomegaly Neurologic: No focal deficits  Lab  Results:  Basic Metabolic Panel:  Recent Labs Lab 04/13/14 1233 04/14/14 0115  NA 141 144  K 4.9 4.5  CL 104 104  CO2 21 20  GLUCOSE 275* 110*  BUN 39* 43*  CREATININE 2.58* 2.94*  CALCIUM 9.3 9.1   CBC:  Recent Labs Lab 04/13/14 1233 04/14/14 0115  WBC 14.7* 14.8*  HGB 15.6 15.0  HCT 46.3 44.8  MCV 87.9 88.4  PLT 154 182   BNP (last 3 results)  Recent Labs  04/13/14 1233  PROBNP 11284.0*   CBG:  Recent Labs Lab 04/13/14 1828 04/13/14 2100 04/14/14 0201 04/14/14 0655  GLUCAP 195* 211* 103* 99     Studies/Results: Dg Chest 2 View  04/13/2014   CLINICAL DATA:  Shortness of breath  EXAM: CHEST  2 VIEW  COMPARISON:  None.  FINDINGS: The cardiac and mediastinal silhouettes are stable in size and contour, and remain within normal limits.  The lungs are mildly hypoinflated. No airspace consolidation, pleural effusion, or pulmonary edema is identified. There is no pneumothorax.  No acute osseous abnormality identified. Surgical clips overlie the region of the right thyroid lobe.  IMPRESSION: No active cardiopulmonary disease.   Electronically Signed   By: Jeannine Boga M.D.   On: 04/13/2014 13:41   Nm Pulmonary Perf And Vent  04/13/2014   CLINICAL DATA:  Evaluate for pulmonary embolism.  EXAM: NUCLEAR MEDICINE VENTILATION -  PERFUSION LUNG SCAN  TECHNIQUE: Ventilation images were obtained in multiple projections using inhaled aerosol technetium 99 M DTPA. Perfusion images were obtained in multiple projections after intravenous injection of Tc-42m MAA.  RADIOPHARMACEUTICALS:  Forty mCi Tc-60m DTPA aerosol and 6 mCi Tc-26m MAA  COMPARISON:  Chest radiograph 04/13/2014  FINDINGS: Ventilation: No focal ventilation defect.  Perfusion: Single moderate perfusion defect within the left upper lung. Possible small defect within the right upper lung. No additional defects are identified.  IMPRESSION: Intermediate for pulmonary embolism.  Critical Value/emergent results were  called by telephone at the time of interpretation on 04/13/2014 at 6:21 pm to Dr. Algis Liming, who verbally acknowledged these results.   Electronically Signed   By: Lovey Newcomer M.D.   On: 04/13/2014 18:25    Medications:  Scheduled: . atenolol  50 mg Oral BID  . azelastine  1 spray Each Nare BID  . clonazePAM  2 mg Oral QHS  . donepezil  5 mg Oral QHS  . enoxaparin (LOVENOX) injection  140 mg Subcutaneous Q12H  . famotidine  40 mg Oral Daily  . fluticasone  1 spray Each Nare BID  . HYDROcodone-acetaminophen  1 tablet Oral QID  . insulin pump   Subcutaneous TID AC, HS, 0200  . levothyroxine  200 mcg Oral QAC breakfast  . sodium chloride  3 mL Intravenous Q12H  . warfarin  7.5 mg Oral ONCE-1800  . warfarin   Does not apply Once  . Warfarin - Pharmacist Dosing Inpatient   Does not apply q1800   Continuous: . sodium chloride     [id number], acetaminophen, albuterol, ondansetron (ZOFRAN) IV, ondansetron  Assessment/Plan:  Principal Problem:   PE (pulmonary embolism) Active Problems:   Hypoxia   DM (diabetes mellitus), type 2, uncontrolled, with renal complications   Essential hypertension   OSA on CPAP   Chronic kidney disease, stage IV (severe)   Acute deep vein thrombosis (DVT) of tibial vein of right lower extremity   Pulmonary embolism    Dyspnea Likely due to Acute PE Considering DVT his presentation is most likely due to acute PE. Change to Lovenox. Start warfarin. Recent long distance travel likely contributed. Also patient not very active. ECHO report is pending.  Right lower extremity positive for DVT Same as above. Change to Lovenox/Warfarin.   Uncontrolled type II DM Continue insulin pump. Diabetes coordinator consulted. Follows up with an endocrinologist in [place].   Hypertension Controlled. Continue home dose of atenolol and lisinopril.   OSA  Continue nightly CPAP. Counseled regarding weight loss and diet.   Stage IV chronic kidney  disease Possibly from diabetic nephropathy. Patient and spouse state that baseline creatinine is 2.54. Creatinine noted to be up today. Probably due to Lasix given yesterday. Will hydrate and re-evaluate in AM. Follows up with a nephrologist in [place].    Code Status: Full Code  DVT Prophylaxis: On full anticoagulation    Family Communication: Discussed with patient and his wife  Disposition Plan: Possible DC in AM.    LOS: 1 day   [facility] Pager [phone] 04/14/2014, 11:43 AM  If 8PM-8AM, please contact night-coverage at [email or web address], password [email or web address]   Disclaimer: This note was dictated with voice recognition software. Similar sounding words can inadvertently be transcribed and may not be corrected upon review.

## 2014-04-14 NOTE — Progress Notes (Signed)
Pt refused BIPAP. RT will continue to monitor.  

## 2014-04-14 NOTE — Progress Notes (Signed)
[place] for heparin Indication: DVT  Allergies  Allergen Reactions  . Sulfa Antibiotics Rash    Patient Measurements: Height: 6\' 1"  (185.4 cm) Weight: 311 lb 8 oz (141.295 kg) IBW/kg (Calculated) : 79.9 Heparin Dosing Weight: 114kg  Vital Signs: Temp: 97.6 F (36.4 C) (07/29 0204) Temp src: Oral (07/29 0204) BP: 125/66 mmHg (07/29 0204) Pulse Rate: 62 (07/29 0204)  Labs:  Recent Labs  04/13/14 1233 04/14/14 0115  HGB 15.6 15.0  HCT 46.3 44.8  PLT 154 182  HEPARINUNFRC  --  0.38  CREATININE 2.58* 2.94*    Estimated Creatinine Clearance: 34.1 ml/min (by C-G formula based on Cr of 2.94).  Assessment: 73 yo male with DVT for heparin  Goal of Therapy:  Heparin level 0.3-0.7 units/ml Monitor platelets by anticoagulation protocol: Yes   Plan:  Continue Heparin at current rate  Check heparin level in 6 hours to verify   Caryl Pina 04/14/2014,3:00 AM

## 2014-04-14 NOTE — Progress Notes (Signed)
ANTICOAGULATION CONSULT NOTE - Initial Consult  Pharmacy Consult for Lovenox/Coumadin Indication: pulmonary embolus, DVT  Allergies  Allergen Reactions  . Sulfa Antibiotics Rash    Patient Measurements: Height: 6\' 1"  (185.4 cm) Weight: 314 lb 1.6 oz (142.475 kg) (BEDSCALE) IBW/kg (Calculated) : 79.9 Heparin Dosing Weight:    Vital Signs: Temp: 97.3 F (36.3 C) (07/29 0536) Temp src: Oral (07/29 0536) BP: 122/91 mmHg (07/29 0949) Pulse Rate: 53 (07/29 0949)  Labs:  Recent Labs  04/13/14 1233 04/14/14 0115 04/14/14 1000  HGB 15.6 15.0  --   HCT 46.3 44.8  --   PLT 154 182  --   HEPARINUNFRC  --  0.38 0.43  CREATININE 2.58* 2.94*  --     Estimated Creatinine Clearance: 34.2 ml/min (by C-G formula based on Cr of 2.94).   Medical History: Past Medical History  Diagnosis Date  . Obesity   . DM (diabetes mellitus), type 2, uncontrolled, with renal complications   . Chronic fatigue   . Essential hypertension   . OSA on CPAP   . Chronic kidney disease, stage IV (severe)     Admit Complaint: Dyspnea 73 y/o M presented 7/28 with worsening SOB and elevated ddimer 11.61. Last long trip to [place] in 4/15.   Anticoagulation: Heparin>>LMWH/Coumadin for DVT/PE. +DVT on dopplers, and VQ schan intermediate. Patient to be transitioned from heparin to LMWH/Coumadin in anticipation of discharge. CBC stable.  Cardiovascular: HTN. 122/91, HR 53 on atenolol,  Endocrinology: Synthroid, DM on home insulin pump, CBG 99-211 Gastrointestinal / Nutrition: morbid obesity Body mass index is 41.45 kg/(m^2). Neurology: Aricept, Klonopin Nephrology: CKD4, CrCl 34 Pulmonary: OSA, 97% on 2L Hematology / Oncology: CBC WNL PTA Medication Issues: HCTZ, lisinopril Best Practices: anticoag, Pepcid   Goal of Therapy:  Anti-Xa level 0.6-1 units/ml 4hrs after LMWH dose given Monitor platelets by anticoagulation protocol: Yes   Plan:  Stop IV heparin In 1 hr start Lovenox 140mg  SQ  BID Coumadin 7.5mg  po x 1 tonight. Coumadin educational book/video CBC every 72h while on LMWH, Daily INR   Myeasha Ballowe S. Alford Highland, PharmD, BCPS Clinical Staff Pharmacist Pager [phone]  [place], [place] 04/14/2014,11:42 AM

## 2014-04-14 NOTE — Progress Notes (Signed)
Echocardiogram 2D Echocardiogram has been performed.  Daniel Ochoa 04/14/2014, 8:53 AM

## 2014-04-14 NOTE — Progress Notes (Signed)
pts heparin gtt dc'd, pt on lovenox and coumadin, education given, pt stable

## 2014-04-14 NOTE — Care Management Note (Addendum)
  Page 2 of 2   04/16/2014     11:55:19 AM CARE MANAGEMENT NOTE 04/16/2014  Patient:  Daniel Ochoa,Daniel Ochoa   Account Number:  [id number]  Date Initiated:  04/14/2014  Documentation initiated by:  Mariann Laster  Subjective/Objective Assessment:   Hypoxia and PE     Action/Plan:   CM to follow for disposition needs   Anticipated DC Date:  04/17/2014   Anticipated DC Plan:  [place]  CM consult      PAC Choice  DURABLE MEDICAL EQUIPMENT   Choice offered to / List presented to:  C-1 Patient   DME arranged  [facility]      DME agency  [place].        Status of service:  Completed, signed off Medicare Important Message given?  YES (If response is "NO", the following Medicare IM given date fields will be blank) Date Medicare IM given:  04/16/2014 Medicare IM given by:  Britley Gashi Date Additional Medicare IM given:   Additional Medicare IM given by:    Discharge Disposition:  HOME/SELF CARE  Per UR Regulation:  Reviewed for med. necessity/level of care/duration of stay  If discussed at Long Length of Stay Meetings, dates discussed:    Comments:  Jakobie Henslee RN, BSN, MSHL, CCM  Nurse - Case Manager,  ([facility])  [phone]  04/16/2014 Initial IM 04/13/14 given by admissions ADM:  Hypoxia and PE Social:  From home with wife.  Moved to [place] from [place], [place] and has been transitioning all medical care to [place]. Home DME:  Kasandra Knudsen, walker Dispositon Plan: Home / [place] at this time. Home with home Lovenox; Nurse working with patient and wife on teaching injections. PCP f/u post discharge and PCP will make referral to Nephrologist in local area. Home Oxygen:  O2 sats RA 88% but no qualifying diagnosis. CM notified O2/DME provider who will discuss options for cost coverage for O2. Home / Self Care at this time.

## 2014-04-15 DIAGNOSIS — I2699 Other pulmonary embolism without acute cor pulmonale: Secondary | ICD-10-CM

## 2014-04-15 HISTORY — DX: Other pulmonary embolism without acute cor pulmonale: I26.99

## 2014-04-15 LAB — BASIC METABOLIC PANEL
Anion gap: 20 — ABNORMAL HIGH (ref 5–15)
Anion gap: 16 — ABNORMAL HIGH (ref 5–15)
BUN: 52 mg/dL — ABNORMAL HIGH (ref 6–23)
BUN: 55 mg/dL — ABNORMAL HIGH (ref 6–23)
CO2: 16 mEq/L — ABNORMAL LOW (ref 19–32)
CO2: 25 mEq/L (ref 19–32)
Calcium: 8.4 mg/dL (ref 8.4–10.5)
Calcium: 8.5 mg/dL (ref 8.4–10.5)
Chloride: 104 mEq/L (ref 96–112)
Chloride: 103 mEq/L (ref 96–112)
Creatinine, Ser: 2.86 mg/dL — ABNORMAL HIGH (ref 0.50–1.35)
Creatinine, Ser: 2.88 mg/dL — ABNORMAL HIGH (ref 0.50–1.35)
GFR calc Af Amer: 24 mL/min — ABNORMAL LOW (ref >90)
GFR calc Af Amer: 24 mL/min — ABNORMAL LOW (ref >90)
GFR calc non Af Amer: 21 mL/min — ABNORMAL LOW (ref >90)
GFR calc non Af Amer: 21 mL/min — ABNORMAL LOW (ref >90)
Glucose, Bld: 124 mg/dL — ABNORMAL HIGH (ref 70–99)
Glucose, Bld: 70 mg/dL (ref 70–99)
Potassium: 5.6 mEq/L — ABNORMAL HIGH (ref 3.7–5.3)
Potassium: 4.4 mEq/L (ref 3.7–5.3)
Sodium: 140 mEq/L (ref 137–147)
Sodium: 144 mEq/L (ref 137–147)

## 2014-04-15 LAB — PROTIME-INR
INR: 1.2 (ref 0.00–1.49)
Prothrombin Time: 15.2 seconds (ref 11.6–15.2)

## 2014-04-15 LAB — GLUCOSE, CAPILLARY
Glucose-Capillary: 116 mg/dL — ABNORMAL HIGH (ref 70–99)
Glucose-Capillary: 143 mg/dL — ABNORMAL HIGH (ref 70–99)
Glucose-Capillary: 102 mg/dL — ABNORMAL HIGH (ref 70–99)

## 2014-04-15 MED ORDER — SODIUM BICARBONATE 650 MG PO TABS
650.0000 mg | ORAL_TABLET | Freq: Three times a day (TID) | ORAL | Status: DC
  Administered 2014-04-15 – 2014-04-16 (×3): 650 mg via ORAL
  Filled 2014-04-15 (×5): qty 1

## 2014-04-15 MED ORDER — WARFARIN SODIUM 7.5 MG PO TABS
7.5000 mg | ORAL_TABLET | Freq: Once | ORAL | Status: AC
  Administered 2014-04-15: 7.5 mg via ORAL
  Filled 2014-04-15: qty 1

## 2014-04-15 MED ORDER — SODIUM BICARBONATE 8.4 % IV SOLN
50.0000 meq | Freq: Once | INTRAVENOUS | Status: DC
  Filled 2014-04-15: qty 50

## 2014-04-15 MED ORDER — SODIUM POLYSTYRENE SULFONATE 15 GM/60ML PO SUSP
15.0000 g | ORAL | Status: AC
  Administered 2014-04-15: 15 g via ORAL
  Filled 2014-04-15: qty 60

## 2014-04-15 MED ORDER — SODIUM POLYSTYRENE SULFONATE 15 GM/60ML PO SUSP
15.0000 g | Freq: Once | ORAL | Status: DC
  Filled 2014-04-15: qty 60

## 2014-04-15 NOTE — Progress Notes (Signed)
[facility] PROGRESS NOTE  Daniel Ochoa [id number] DOB: 1941-07-26 DOA: 04/13/2014  PCP: Antony Blackbird, MD  Brief HPI: 73yo male presented with shortness of breath. Was noted to be mildly hypoxic. Was found to have LE DVT and VQ scan was intermediate probability for PE.  Past medical history:  Past Medical History  Diagnosis Date  . Obesity   . DM (diabetes mellitus), type 2, uncontrolled, with renal complications   . Chronic fatigue   . Essential hypertension   . OSA on CPAP   . Chronic kidney disease, stage IV (severe)    Consultants:  None  Procedures:  LE Venous Dopplers: Right LE Acute DVT  2D ECHO: Study Conclusions - Left ventricle: The cavity size was normal. Wall thickness was increased in a pattern of moderate LVH. Systolic function was normal. The estimated ejection fraction was in the range of 55% to 60%. Wall motion was normal; there were no regional wall motion abnormalities. Doppler parameters are consistent with abnormal left ventricular relaxation (grade 1 diastolic dysfunction). - Tricuspid valve: There was moderate regurgitation. - Pulmonary arteries: PA peak pressure: 89 mm Hg (S).  Antibiotics: None  Subjective: Patient got short of breath overnight and some this morning. Mainly with exertion. Now better. Denies chest pain.   Objective: Vital Signs  Filed Vitals:   04/14/14 1425 04/14/14 1804 04/14/14 2027 04/14/14 2237  BP: 100/58 118/71 110/67 116/56  Pulse:  62 57 53  Temp:  97.3 F (36.3 C) 97.4 F (36.3 C)   TempSrc:  Axillary Oral   Resp:  18 17   Height:      Weight:      SpO2:  90% 99%     Intake/Output Summary (Last 24 hours) at 04/15/14 1115 Last data filed at 04/14/14 2202  Gross per 24 hour  Intake    540 ml  Output    750 ml  Net   -210 ml   Filed Weights   04/13/14 1602 04/13/14 1815 04/14/14 0536  Weight: 147.419 kg (325 lb) 141.295 kg (311 lb 8 oz) 142.475 kg (314 lb 1.6 oz)   General appearance: alert,  cooperative, no distress and moderately obese Resp: clear to auscultation bilaterally Cardio: regular rate and rhythm, S1, S2 normal, no murmur, click, rub or gallop. No edema. GI: soft, non-tender; bowel sounds normal; no masses,  no organomegaly Neurologic: No focal deficits  Lab Results:  Basic Metabolic Panel:  Recent Labs Lab 04/13/14 1233 04/14/14 0115 04/15/14 0605  NA 141 144 140  K 4.9 4.5 5.6*  CL 104 104 104  CO2 21 20 16*  GLUCOSE 275* 110* 124*  BUN 39* 43* 52*  CREATININE 2.58* 2.94* 2.86*  CALCIUM 9.3 9.1 8.4   CBC:  Recent Labs Lab 04/13/14 1233 04/14/14 0115  WBC 14.7* 14.8*  HGB 15.6 15.0  HCT 46.3 44.8  MCV 87.9 88.4  PLT 154 182   BNP (last 3 results)  Recent Labs  04/13/14 1233  PROBNP 11284.0*   CBG:  Recent Labs Lab 04/14/14 2155 04/14/14 2217 04/14/14 2322 04/15/14 0204 04/15/14 1057  GLUCAP 54* 71 94 116* 143*     Studies/Results: Dg Chest 2 View  04/13/2014   CLINICAL DATA:  Shortness of breath  EXAM: CHEST  2 VIEW  COMPARISON:  None.  FINDINGS: The cardiac and mediastinal silhouettes are stable in size and contour, and remain within normal limits.  The lungs are mildly hypoinflated. No airspace consolidation, pleural effusion, or pulmonary edema is identified.  There is no pneumothorax.  No acute osseous abnormality identified. Surgical clips overlie the region of the right thyroid lobe.  IMPRESSION: No active cardiopulmonary disease.   Electronically Signed   By: Jeannine Boga M.D.   On: 04/13/2014 13:41   Nm Pulmonary Perf And Vent  04/13/2014   CLINICAL DATA:  Evaluate for pulmonary embolism.  EXAM: NUCLEAR MEDICINE VENTILATION - PERFUSION LUNG SCAN  TECHNIQUE: Ventilation images were obtained in multiple projections using inhaled aerosol technetium 99 M DTPA. Perfusion images were obtained in multiple projections after intravenous injection of Tc-70m MAA.  RADIOPHARMACEUTICALS:  Forty mCi Tc-79m DTPA aerosol and 6 mCi  Tc-33m MAA  COMPARISON:  Chest radiograph 04/13/2014  FINDINGS: Ventilation: No focal ventilation defect.  Perfusion: Single moderate perfusion defect within the left upper lung. Possible small defect within the right upper lung. No additional defects are identified.  IMPRESSION: Intermediate for pulmonary embolism.  Critical Value/emergent results were called by telephone at the time of interpretation on 04/13/2014 at 6:21 pm to Dr. Algis Liming, who verbally acknowledged these results.   Electronically Signed   By: Lovey Newcomer M.D.   On: 04/13/2014 18:25    Medications:  Scheduled: . atenolol  50 mg Oral BID  . azelastine  1 spray Each Nare BID  . clonazePAM  2 mg Oral QHS  . donepezil  5 mg Oral QHS  . enoxaparin (LOVENOX) injection  140 mg Subcutaneous Q12H  . famotidine  40 mg Oral Daily  . fluticasone  1 spray Each Nare BID  . HYDROcodone-acetaminophen  1 tablet Oral QID  . insulin pump   Subcutaneous TID AC, HS, 0200  . levothyroxine  200 mcg Oral QAC breakfast  . sodium bicarbonate  50 mEq Intravenous Once  . sodium bicarbonate  650 mg Oral TID  . sodium chloride  3 mL Intravenous Q12H  . sodium polystyrene  15 g Oral Once  . warfarin  7.5 mg Oral ONCE-1800  . warfarin   Does not apply Once  . Warfarin - Pharmacist Dosing Inpatient   Does not apply q1800   Continuous: . sodium chloride 75 mL/hr at 04/15/14 0401   [id number], acetaminophen, albuterol, ondansetron (ZOFRAN) IV, ondansetron  Assessment/Plan:  Principal Problem:   PE (pulmonary embolism) Active Problems:   Hypoxia   DM (diabetes mellitus), type 2, uncontrolled, with renal complications   Essential hypertension   OSA on CPAP   Chronic kidney disease, stage IV (severe)   Acute deep vein thrombosis (DVT) of tibial vein of right lower extremity   Pulmonary embolism    Dyspnea Likely due to Acute PE Considering DVT his presentation is most likely due to acute PE. Continue Lovenox/warfarin bridge. Recent  long distance travel likely contributed. Also patient not very active. ECHO report reviewed. Check RA sats with ambulation. May need home O2. PT/OT consult as well.  Right lower extremity positive for DVT Same as above. On Lovenox/Warfarin.   Uncontrolled type II DM Hypoglycemic episode overnight. Ok this AM. Continue insulin pump. Diabetes coordinator consulted. Follows up with an endocrinologist in [place].   Hypertension Controlled. Continue home dose of atenolol and lisinopril.   OSA  Continue nightly CPAP. Counseled regarding weight loss and diet.   Stage IV chronic kidney disease Possibly from diabetic nephropathy. Patient and spouse state that baseline creatinine is 2.54. Creatinine is better now. Bicarb noted to be low as well with hyperkalemia. Though sample could be hemolysed. Will give kayexalate. Will give Sod Bicarb. Elevation in creatinine probably due to  Lasix given on admission. Follows up with a nephrologist in [place].   Code Status: Full Code  DVT Prophylaxis: On full anticoagulation    Family Communication: Discussed with patient and his wife  Disposition Plan: Anticipate discharge in AM.    LOS: 2 days   [facility] Pager [phone] 04/15/2014, 11:15 AM  If 8PM-8AM, please contact night-coverage at [email or web address], password [email or web address]   Disclaimer: This note was dictated with voice recognition software. Similar sounding words can inadvertently be transcribed and may not be corrected upon review.

## 2014-04-15 NOTE — Evaluation (Signed)
Physical Therapy Evaluation Patient Details Name: Daniel Ochoa MRN: [id number] DOB: 05/01/1941 Today's Date: 04/15/2014   History of Present Illness  Per MD, 73yo male presented with shortness of breath. Was noted to be mildly hypoxic. Was found to have LE DVT and VQ scan was intermediate probability for PE  Clinical Impression  Patient demonstrates deficits in mobility as indicated below. Patient will need continued skilled PT to address deficits and maximize function. Will see as indicated and progress activity as tolerated. Educated patient and spouse on energy conservation techniques and breathing techniques for improvement of oxygenation levels with activity.    Follow Up Recommendations Home health PT;Supervision/Assistance - 24 hour (pending progress)    Equipment Recommendations  None recommended by PT    Recommendations for Other Services       Precautions / Restrictions Precautions Precautions: Fall Restrictions Weight Bearing Restrictions: No      Mobility  Bed Mobility Overal bed mobility: Modified Independent             General bed mobility comments: increased time to perform, decreased endurance SOB while coming to EOB, patient educated on techniques for mobility to decreased UE use and limit fatigue with transitional movements  Transfers Overall transfer level: Modified independent Equipment used: Rolling walker (2 wheeled)             General transfer comment: VCs for safe hand placement  Ambulation/Gait Ambulation/Gait assistance: Min guard;Min assist Ambulation Distance (Feet): 50 Feet Assistive device:  (use of hand rail on right) Gait Pattern/deviations: Step-through pattern;Decreased stride length;Antalgic;Trendelenburg;Trunk flexed Gait velocity: decreased Gait velocity interpretation: <1.8 ft/sec, indicative of risk for recurrent falls General Gait Details: significantly SOB with increased WOB and patient reporting hot flash with flush  skin tone.  Patient stoped for rest breaks x4 during short distance ambulation, SpO2 monitored, desaturated on room air to 88% with sympotms, when returning to room, supplemental oxygen reapplied took 2 minutes 40 seconds to rebound above 90% and total of 4 mins to rebound >96%  Stairs            Wheelchair Mobility    Modified Rankin (Stroke Patients Only)       Balance                                             Pertinent Vitals/Pain SpO2 desaturated with minimal ambulation to 88% with extended time to rebound. 3 liters supplemental oxygen reapplied took 2 minutes 40 seconds to rebound above 90% and total of 4 mins to rebound >96%    Home Living Family/patient expects to be discharged to:: Private residence Living Arrangements: Spouse/significant other Available Help at Discharge: Family Type of Home: House Home Access: Level entry     Home Layout: One level Home Equipment: Environmental consultant - 2 wheels;Cane - single point      Prior Function Level of Independence: Independent with assistive device(s)               Hand Dominance   Dominant Hand: Right    Extremity/Trunk Assessment   Upper Extremity Assessment: Generalized weakness           Lower Extremity Assessment: RLE deficits/detail RLE Deficits / Details: painful RLE history of thrombis    Cervical / Trunk Assessment:  (increased body habitus)  Communication   Communication: HOH (wears glasses all the time)  Cognition Arousal/Alertness: Awake/alert Behavior  During Therapy: WFL for tasks assessed/performed Overall Cognitive Status: Within Functional Limits for tasks assessed                      General Comments General comments (skin integrity, edema, etc.): spoke with patient and spouse at length regarding energy conservation techniques, pursed lip breathing as limited UE use. Addressed concerns for mobility and expectations for increased mobility.     Exercises         Assessment/Plan    PT Assessment Patient needs continued PT services  PT Diagnosis Difficulty walking;Abnormality of gait;Generalized weakness   PT Problem List Decreased strength;Decreased activity tolerance;Decreased balance;Decreased mobility;Cardiopulmonary status limiting activity  PT Treatment Interventions DME instruction;Gait training;Functional mobility training;Therapeutic activities;Therapeutic exercise;Balance training;Patient/family education   PT Goals (Current goals can be found in the Care Plan section) Acute Rehab PT Goals Patient Stated Goal: to be able to breathe PT Goal Formulation: With patient Time For Goal Achievement: 04/29/14 Potential to Achieve Goals: Good    Frequency Min 3X/week   Barriers to discharge        Co-evaluation               End of Session Equipment Utilized During Treatment: Gait belt;Oxygen Activity Tolerance: Patient limited by fatigue;Other (comment) (decreased SpO2) Patient left: in bed;with call bell/phone within reach;with family/visitor present Nurse Communication: Mobility status         Time: [id number] PT Time Calculation (min): 25 min   Charges:   PT Evaluation $Initial PT Evaluation Tier I: 1 Procedure PT Treatments $Gait Training: 8-22 mins $Self Care/Home Management: 8-22   PT G CodesDuncan Dull 04/15/2014, 3:20 PM Alben Deeds, [place] DPT  [phone]

## 2014-04-15 NOTE — Progress Notes (Signed)
ANTICOAGULATION CONSULT NOTE - Follow Up Consult  Pharmacy Consult for Warfarin Indication: DVT  Allergies  Allergen Reactions  . Sulfa Antibiotics Rash    Patient Measurements: Height: 6\' 1"  (185.4 cm) Weight: 314 lb 1.6 oz (142.475 kg) (BEDSCALE) IBW/kg (Calculated) : 79.9  Vital Signs: Temp: 97.4 F (36.3 C) (07/29 2027) Temp src: Oral (07/29 2027) BP: 116/56 mmHg (07/29 2237) Pulse Rate: 53 (07/29 2237)  Labs:  Recent Labs  04/13/14 1233 04/14/14 0115 04/14/14 1000 04/15/14 0605  HGB 15.6 15.0  --   --   HCT 46.3 44.8  --   --   PLT 154 182  --   --   LABPROT  --   --  14.5 15.2  INR  --   --  1.13 1.20  HEPARINUNFRC  --  0.38 0.43  --   CREATININE 2.58* 2.94*  --  2.86*    Estimated Creatinine Clearance: 35.2 ml/min (by C-G formula based on Cr of 2.86).    Assessment: 23 yom being bridged to warfarin with Lovenox for DVT.  Patient is currently receiving Lovenox 140 mg SQ BID and received 1 dose of warfarin 7.5 mg.  Today his INR is subtherapeutic at 1.20 but is trending up from his baseline 1.13. Expect overlap of warfarin and Lovenox for at least 5 days and until INR > 2.  Goal of Therapy:  INR 2-3 Anti-Xa level 0.6-1 units/ml 4hrs after LMWH dose given Monitor platelets by anticoagulation protocol: Yes   Plan:  1) Continue Lovenox 140mg  SQ BID 2) Give Coumadin 7.5mg  po x 1 tonight. 3) CBC every 72h while on LMWH, Daily INR  Theron Arista, PharmD Clinical Pharmacist - Resident Pager: [phone] 7/30/20158:21 AM

## 2014-04-15 NOTE — Progress Notes (Signed)
SATURATION QUALIFICATIONS: (This note is used to comply with regulatory documentation for home oxygen)  Patient Saturations on Room Air at Rest = 95%  Patient Saturations on Room Air while Ambulating = 88%  Patient Saturations on 3 Liters of oxygen while Ambulating = 92%  Please briefly explain why patient needs home oxygen:desaturated on room air to 88% with sympotms, when returning to room, supplemental oxygen 3 liters reapplied took 2 minutes 40 seconds to rebound above 90% and total of 4 mins to rebound >96%   Alben Deeds, PT DPT  [phone]

## 2014-04-15 NOTE — Progress Notes (Signed)
RN walked with Pt into hallway while monitoring SATs. Pt baseline SAT was 97 with 2L of O2. When nasal canula removed, Pt dSat to 92. When Pt began to stand and walk SATs rose to 96-100. SAT maintained at 100 for about 35ft. Pt became short of breath while walking and started to walk back to the room. Pt SATs were 93 by the time he enter the room and 92 once he sat back down on his bed. O2 applied.

## 2014-04-16 LAB — PROTIME-INR
INR: 1.26 (ref 0.00–1.49)
Prothrombin Time: 15.8 seconds — ABNORMAL HIGH (ref 11.6–15.2)

## 2014-04-16 LAB — BASIC METABOLIC PANEL
Anion gap: 14 (ref 5–15)
BUN: 61 mg/dL — ABNORMAL HIGH (ref 6–23)
CO2: 20 mEq/L (ref 19–32)
Calcium: 7.7 mg/dL — ABNORMAL LOW (ref 8.4–10.5)
Chloride: 106 mEq/L (ref 96–112)
Creatinine, Ser: 2.88 mg/dL — ABNORMAL HIGH (ref 0.50–1.35)
GFR calc Af Amer: 24 mL/min — ABNORMAL LOW (ref >90)
GFR calc non Af Amer: 21 mL/min — ABNORMAL LOW (ref >90)
Glucose, Bld: 89 mg/dL (ref 70–99)
Potassium: 4.3 mEq/L (ref 3.7–5.3)
Sodium: 140 mEq/L (ref 137–147)

## 2014-04-16 LAB — GLUCOSE, CAPILLARY
Glucose-Capillary: 99 mg/dL (ref 70–99)
Glucose-Capillary: 88 mg/dL (ref 70–99)
Glucose-Capillary: 119 mg/dL — ABNORMAL HIGH (ref 70–99)

## 2014-04-16 MED ORDER — ENOXAPARIN SODIUM 150 MG/ML ~~LOC~~ SOLN: 140.0000 mg | Freq: Two times a day (BID) | SUBCUTANEOUS | Status: DC

## 2014-04-16 MED ORDER — WARFARIN SODIUM 10 MG PO TABS
10.0000 mg | ORAL_TABLET | Freq: Once | ORAL | Status: DC
  Filled 2014-04-16: qty 1

## 2014-04-16 MED ORDER — WARFARIN SODIUM 2.5 MG PO TABS: ORAL_TABLET | ORAL | Status: DC

## 2014-04-16 MED ORDER — SODIUM BICARBONATE 650 MG PO TABS: 650.0000 mg | ORAL_TABLET | Freq: Two times a day (BID) | ORAL | Status: DC

## 2014-04-16 NOTE — Progress Notes (Signed)
Pt  States that he does not currently have a kidney doctor. Secretary unable to make appointment for follow up appointment due to patient no longer having previous doctor.  Paged and spoke with Dr. Maryland Pink to see if he wanted to refer him to a kidney doctor, and Maryland Pink stated that him primary physician will take care of that.

## 2014-04-16 NOTE — Progress Notes (Signed)
Discharge teaching completed with patient and wife. Oxygen was ordered for patient to take home, and has arrived. Understanding verbalized.

## 2014-04-16 NOTE — Discharge Summary (Signed)
[facility]  Physician Discharge Summary   Patient ID: Daniel Ochoa MRN: [id number] DOB/AGE: Dec 03, 1940 73 y.o.  Admit date: 04/13/2014 Discharge date: 04/16/2014  PCP: Antony Blackbird, MD  DISCHARGE DIAGNOSES:  Principal Problem:   Acute pulmonary embolism Active Problems:   Hypoxia   DM (diabetes mellitus), type 2, uncontrolled, with renal complications   Essential hypertension   OSA on CPAP   Chronic kidney disease, stage IV (severe)   Acute deep vein thrombosis (DVT) of tibial vein of right lower extremity   Pulmonary embolism   RECOMMENDATIONS FOR OUTPATIENT FOLLOW UP: 1. Needs referral to GI. See below 2. Needs PT/INR on 8/3 3. Needs lovenox bridge with warfarin for 5 days and with one day overlap after INR is therapeutic. Today is day 3 of lovenox.  4. Might benefit from referral to nephrology as well. 5. Being sent home with Home health and home Oxygen  DISCHARGE CONDITION: fair  Diet recommendation: Mod Carb  Filed Weights   04/13/14 1815 04/14/14 0536 04/16/14 0557  Weight: 141.295 kg (311 lb 8 oz) 142.475 kg (314 lb 1.6 oz) 143.836 kg (317 lb 1.6 oz)    INITIAL HISTORY: 73yo male presented with shortness of breath. Was noted to be mildly hypoxic. Was found to have LE DVT and VQ scan was intermediate probability for PE.  Consultations:  None  Procedures:  LE Venous Dopplers: Right LE Acute DVT   2D ECHO: Study Conclusions - Left ventricle: The cavity size was normal. Wall thickness was increased in a pattern of moderate LVH. Systolic function was normal. The estimated ejection fraction was in the range of 55% to 60%. Wall motion was normal; there were no regional wall motion abnormalities. Doppler parameters are consistent with abnormal left ventricular relaxation (grade 1 diastolic dysfunction). - Tricuspid valve: There was moderate regurgitation. - Pulmonary arteries: PA peak pressure: 89 mm Hg (S).   HOSPITAL COURSE:   Dyspnea Likely due to  Acute PE  Considering presence of acute DVT, his presentation is most likely due to acute PE. VQ scan was intermediate probability. Continue Lovenox/warfarin bridge. Recent long distance travel likely contributed. Also patient not very active. ECHO report reviewed. Patient hypoxic with exertion. Will need home oxygen.   Right lower extremity positive for DVT  Same as above. On Lovenox/Warfarin. PCP to follow as OP. INR on 8/3. Wife has made appointment.  Uncontrolled type II DM  On insulin pump which should be continued. Follows up with an endocrinologist in [place].   Hypertension  Stable. Continue home dose of atenolol and lisinopril.   OSA  Continue nightly CPAP. Counseled regarding weight loss and diet.   Stage IV chronic kidney disease  Possibly from diabetic nephropathy. Patient and spouse state that baseline creatinine is 2.54. Creatinine is better now. Bicarb was noted to be low as well with hyperkalemia on 7/30. Though sample could be hemolysed. He was given kayexalate and Sod Bicarb. Labs much better today. Needs OP follow up for same. Apparently no longer sees a nephrologist. PCP will need to arrange.  Blood on Tissue Paper When he wiped himself after a BM yesterday small amount of blood was noted. Stool was yellow. No black stool. No further recurrence. He has never had a colonoscopy. I discussed this situation with Dr. Leroy Kennedy with GI considering he will be on anticoagulation. He recommends OP follow up with PCP and consider referral to GI at that time. No indication for inpatient colonoscopy. Patient has been told to watch for significant bleeding and  if it occurs, to seek attention immediately. Wife and patient agree with this plan.  Patient is stable for discharge.   PERTINENT LABS:  The results of significant diagnostics from this hospitalization (including imaging, microbiology, ancillary and laboratory) are listed below for reference.     Labs: Basic Metabolic  Panel:  Recent Labs Lab 04/13/14 1233 04/14/14 0115 04/15/14 0605 04/15/14 1640 04/16/14 0355  NA 141 144 140 144 140  K 4.9 4.5 5.6* 4.4 4.3  CL 104 104 104 103 106  CO2 21 20 16* 25 20  GLUCOSE 275* 110* 124* 70 89  BUN 39* 43* 52* 55* 61*  CREATININE 2.58* 2.94* 2.86* 2.88* 2.88*  CALCIUM 9.3 9.1 8.4 8.5 7.7*   CBC:  Recent Labs Lab 04/13/14 1233 04/14/14 0115  WBC 14.7* 14.8*  HGB 15.6 15.0  HCT 46.3 44.8  MCV 87.9 88.4  PLT 154 182   BNP: BNP (last 3 results)  Recent Labs  04/13/14 1233  PROBNP 11284.0*   CBG:  Recent Labs Lab 04/15/14 0204 04/15/14 1057 04/15/14 1600 04/15/14 2156 04/16/14 0621  GLUCAP 116* 143* 102* 99 88     IMAGING STUDIES Dg Chest 2 View  04/13/2014   CLINICAL DATA:  Shortness of breath  EXAM: CHEST  2 VIEW  COMPARISON:  None.  FINDINGS: The cardiac and mediastinal silhouettes are stable in size and contour, and remain within normal limits.  The lungs are mildly hypoinflated. No airspace consolidation, pleural effusion, or pulmonary edema is identified. There is no pneumothorax.  No acute osseous abnormality identified. Surgical clips overlie the region of the right thyroid lobe.  IMPRESSION: No active cardiopulmonary disease.   Electronically Signed   By: Jeannine Boga M.D.   On: 04/13/2014 13:41   Nm Pulmonary Perf And Vent  04/13/2014   CLINICAL DATA:  Evaluate for pulmonary embolism.  EXAM: NUCLEAR MEDICINE VENTILATION - PERFUSION LUNG SCAN  TECHNIQUE: Ventilation images were obtained in multiple projections using inhaled aerosol technetium 99 M DTPA. Perfusion images were obtained in multiple projections after intravenous injection of Tc-35m MAA.  RADIOPHARMACEUTICALS:  Forty mCi Tc-26m DTPA aerosol and 6 mCi Tc-21m MAA  COMPARISON:  Chest radiograph 04/13/2014  FINDINGS: Ventilation: No focal ventilation defect.  Perfusion: Single moderate perfusion defect within the left upper lung. Possible small defect within the right  upper lung. No additional defects are identified.  IMPRESSION: Intermediate for pulmonary embolism.  Critical Value/emergent results were called by telephone at the time of interpretation on 04/13/2014 at 6:21 pm to Dr. Algis Liming, who verbally acknowledged these results.   Electronically Signed   By: Lovey Newcomer M.D.   On: 04/13/2014 18:25    DISCHARGE EXAMINATION: Filed Vitals:   04/14/14 2237 04/15/14 1636 04/15/14 2100 04/16/14 0557  BP: 116/56 124/58 112/57 119/63  Pulse: 53 57 58 60  Temp:  97.5 F (36.4 C) 97.4 F (36.3 C) 97.4 F (36.3 C)  TempSrc:  Oral Oral Oral  Resp:  18 18 18   Height:      Weight:    143.836 kg (317 lb 1.6 oz)  SpO2:  98% 97% 95%   General appearance: alert, cooperative, appears stated age, no distress and morbidly obese Resp: decreased air entry at bases. no crackles. no wheezing. Cardio: regular rate and rhythm, S1, S2 normal, no murmur, click, rub or gallop GI: soft, non-tender; bowel sounds normal; no masses,  no organomegaly Neurologic: No focal deficits  DISPOSITION: Home with home health  Discharge Instructions   Call MD  for:  difficulty breathing, headache or visual disturbances    Complete by:  As directed      Call MD for:  persistant dizziness or light-headedness    Complete by:  As directed      Call MD for:  persistant nausea and vomiting    Complete by:  As directed      Call MD for:  severe uncontrolled pain    Complete by:  As directed      Call MD for:    Complete by:  As directed   Significant bleeding     Diet Carb Modified    Complete by:  As directed      Discharge instructions    Complete by:  As directed   Follow up with you PCP on 8/3 as we discussed. Have your INR checked to adjust warfarin dose. You need a referral to a GI doctor for colonoscopy at some point. If you see large amounts of blood in your stool, this may need to happen earlier. Please discuss this with your PCP. Watch for significant bleeding or signs of  bleeding as instructed.     Increase activity slowly    Complete by:  As directed            ALLERGIES:  Allergies  Allergen Reactions  . Sulfa Antibiotics Rash    Current Discharge Medication List    START taking these medications   Details  enoxaparin (LOVENOX) 150 MG/ML injection Inject 0.93 mLs (140 mg total) into the skin every 12 (twelve) hours. For 4 more days. Qty: 8 Syringe, Refills: 0    sodium bicarbonate 650 MG tablet Take 1 tablet (650 mg total) by mouth 2 (two) times daily. Qty: 60 tablet, Refills: 1    warfarin (COUMADIN) 2.5 MG tablet Take 10mg  (4 tablets) on 7/31 and 8/1. Then take 7.5mg  (3 tablets) on 8/2 and then as per your doctor's instructions on 8/3. Qty: 60 tablet, Refills: 2      CONTINUE these medications which have NOT CHANGED   Details  atenolol (TENORMIN) 50 MG tablet Take 50 mg by mouth 2 (two) times daily.     clonazePAM (KLONOPIN) 2 MG tablet Take 2 mg by mouth daily.     donepezil (ARICEPT) 5 MG tablet Take 5 mg by mouth daily.     DYMISTA 137-50 MCG/ACT SUSP Place 1 spray into both nostrils 2 (two) times daily.     famotidine (PEPCID) 40 MG tablet Take 40 mg by mouth daily.     hydrochlorothiazide (HYDRODIURIL) 25 MG tablet Take 25 mg by mouth daily.     HYDROcodone-acetaminophen (NORCO/VICODIN) 5-325 MG per tablet Take 1 tablet by mouth every 6 (six) hours.     Insulin Human (INSULIN PUMP) SOLN Inject 1 each into the skin continuous. Humulin R    lisinopril (PRINIVIL,ZESTRIL) 10 MG tablet Take 10 mg by mouth daily.     SYNTHROID 200 MCG tablet Take 200 mcg by mouth daily before breakfast.       STOP taking these medications     ciprofloxacin (CIPRO) 500 MG tablet        Follow-up Information   Follow up with FULP, CAMMIE, MD. (on 04/19/14 as scheduled for post hospitalization follow up and PT/INR check. Also discuss referral to GI doctor.)    Specialty:  Family Medicine   Contact information:   [id number] [place] [id number]       Follow up with Kidney Doctor. (follow  up with your kidney doctor in 2 weeks)       TOTAL DISCHARGE TIME: 35 mins  [facility]  [facility] Pager [phone]  04/16/2014, 10:34 AM  Disclaimer: This note was dictated with voice recognition software. Similar sounding words can inadvertently be transcribed and may not be corrected upon review.

## 2014-04-16 NOTE — Progress Notes (Signed)
SATURATION QUALIFICATIONS: (This note is used to comply with regulatory documentation for home oxygen)  Patient Saturations on Room Air at Rest = 93%  Patient Saturations on Room Air while Ambulating = 84%  Patient Saturations on 3.5 Liters of oxygen while Ambulating = 96%  Please briefly explain why patient needs home oxygen:

## 2014-04-16 NOTE — Progress Notes (Signed)
ANTICOAGULATION CONSULT NOTE - Follow Up Consult  Pharmacy Consult for warfarin Indication: DVT  Allergies  Allergen Reactions  . Sulfa Antibiotics Rash    Patient Measurements: Height: 6\' 1"  (185.4 cm) Weight: 317 lb 1.6 oz (143.836 kg) (scale c) IBW/kg (Calculated) : 79.9  Vital Signs: Temp: 97.4 F (36.3 C) (07/31 0557) Temp src: Oral (07/31 0557) BP: 119/63 mmHg (07/31 0557) Pulse Rate: 60 (07/31 0557)  Labs:  Recent Labs  04/13/14 1233 04/14/14 0115 04/14/14 1000 04/15/14 0605 04/15/14 1640 04/16/14 0355  HGB 15.6 15.0  --   --   --   --   HCT 46.3 44.8  --   --   --   --   PLT 154 182  --   --   --   --   LABPROT  --   --  14.5 15.2  --  15.8*  INR  --   --  1.13 1.20  --  1.26  HEPARINUNFRC  --  0.38 0.43  --   --   --   CREATININE 2.58* 2.94*  --  2.86* 2.88* 2.88*    Estimated Creatinine Clearance: 35.1 ml/min (by C-G formula based on Cr of 2.88).   Assessment: 72 yom being bridged to warfarin with Lovenox for DVT(Day 3). Patient is currently receiving Lovenox 140 mg SQ BID and received warfarin 7.5 mg X 2 days. His INR continues to remain subtherapeutic at 1.13>>1.20>>1.26.  Since his INR does not appear to be trending up significantly, will give 10 mg of warfarin once tonight. Expect overlap of warfarin and Lovenox for at least 5 days and until INR > 2.  Goal of Therapy:  INR 2-3    Plan:  1) Continue Lovenox 140mg  SQ BID 2) Coumadin 10 mg po x 1 tonight. 3) CBC every 72h while on LMWH, Daily INR  Theron Arista, PharmD Clinical Pharmacist - Resident Pager: [phone] 7/31/20159:34 AM

## 2014-04-16 NOTE — Discharge Instructions (Signed)
Information on my medicine - Coumadin   (Warfarin)  This medication education was reviewed with me or my healthcare representative as part of my discharge preparation.  The pharmacist that spoke with me during my hospital stay was:  McCallister, Maud Deed, [facility]  Why was Coumadin prescribed for you? Coumadin was prescribed for you because you have a blood clot or a medical condition that can cause an increased risk of forming blood clots. Blood clots can cause serious health problems by blocking the flow of blood to the heart, lung, or brain. Coumadin can prevent harmful blood clots from forming. As a reminder your indication for Coumadin is:   Deep Vein Thrombosis Treatment  What test will check on my response to Coumadin? While on Coumadin (warfarin) you will need to have an INR test regularly to ensure that your dose is keeping you in the desired range. The INR (international normalized ratio) number is calculated from the result of the laboratory test called prothrombin time (PT).  If an INR APPOINTMENT HAS NOT ALREADY BEEN MADE FOR YOU please schedule an appointment to have this lab work done by your health care provider within 7 days. Your INR goal is usually a number between:  2 to 3 or your provider may give you a more narrow range like 2-2.5.  Ask your health care provider during an office visit what your goal INR is.  What  do you need to  know  About  COUMADIN? Take Coumadin (warfarin) exactly as prescribed by your healthcare provider about the same time each day.  DO NOT stop taking without talking to the doctor who prescribed the medication.  Stopping without other blood clot prevention medication to take the place of Coumadin may increase your risk of developing a new clot or stroke.  Get refills before you run out.  What do you do if you miss a dose? If you miss a dose, take it as soon as you remember on the same day then continue your regularly scheduled regimen the next day.  Do not  take two doses of Coumadin at the same time.  Important Safety Information A possible side effect of Coumadin (Warfarin) is an increased risk of bleeding. You should call your healthcare provider right away if you experience any of the following:   Bleeding from an injury or your nose that does not stop.   Unusual colored urine (red or dark brown) or unusual colored stools (red or black).   Unusual bruising for unknown reasons.   A serious fall or if you hit your head (even if there is no bleeding).  Some foods or medicines interact with Coumadin (warfarin) and might alter your response to warfarin. To help avoid this:   Eat a balanced diet, maintaining a consistent amount of Vitamin K.   Notify your provider about major diet changes you plan to make.   Avoid alcohol or limit your intake to 1 drink for women and 2 drinks for men per day. (1 drink is 5 oz. wine, 12 oz. beer, or 1.5 oz. liquor.)  Make sure that ANY health care provider who prescribes medication for you knows that you are taking Coumadin (warfarin).  Also make sure the healthcare provider who is monitoring your Coumadin knows when you have started a new medication including herbals and non-prescription products.  Coumadin (Warfarin)  Major Drug Interactions  Increased Warfarin Effect Decreased Warfarin Effect  Alcohol (large quantities) Antibiotics (esp. Septra/Bactrim, Flagyl, Cipro) Amiodarone (Cordarone) Aspirin (ASA) Cimetidine (  Tagamet) Megestrol (Megace) NSAIDs (ibuprofen, naproxen, etc.) Piroxicam (Feldene) Propafenone (Rythmol SR) Propranolol (Inderal) Isoniazid (INH) Posaconazole (Noxafil) Barbiturates (Phenobarbital) Carbamazepine (Tegretol) Chlordiazepoxide (Librium) Cholestyramine (Questran) Griseofulvin Oral Contraceptives Rifampin Sucralfate (Carafate) Vitamin K   Coumadin (Warfarin) Major Herbal Interactions  Increased Warfarin Effect Decreased Warfarin Effect  Garlic Ginseng Ginkgo biloba  Coenzyme Q10 Green tea St. Johns wort    Coumadin (Warfarin) FOOD Interactions  Eat a consistent number of servings per week of foods HIGH in Vitamin K (1 serving =  cup)  Collards (cooked, or boiled & drained) Kale (cooked, or boiled & drained) Mustard greens (cooked, or boiled & drained) Parsley *serving size only =  cup Spinach (cooked, or boiled & drained) Swiss chard (cooked, or boiled & drained) Turnip greens (cooked, or boiled & drained)  Eat a consistent number of servings per week of foods MEDIUM-HIGH in Vitamin K (1 serving = 1 cup)  Asparagus (cooked, or boiled & drained) Broccoli (cooked, boiled & drained, or raw & chopped) Brussel sprouts (cooked, or boiled & drained) *serving size only =  cup Lettuce, raw (green leaf, endive, romaine) Spinach, raw Turnip greens, raw & chopped   These websites have more information on Coumadin (warfarin):  FailFactory.se; VeganReport.com.au;  Pulmonary Embolism A pulmonary (lung) embolism (PE) is a blood clot that has traveled to the lung and results in a blockage of blood flow in the affected lung. Most clots come from deep veins in the legs or pelvis. PE is a dangerous and potentially life-threatening condition that can be treated if identified. CAUSES Blood clots form in a vein for different reasons. Usually several things cause blood clots. They include:  The flow of blood slows down.  The inside of the vein is damaged in some way.  The person has a condition that makes the blood clot more easily. RISK FACTORS Some people are more likely than others to develop PE. Risk factors include:   Smoking.  Being overweight (obese).  Sitting or lying still for a long time. This includes long-distance travel, paralysis, or recovery from an illness or surgery. Other factors that increase risk are:   Older age, especially over 12 years of age.  Having a family history of blood clots or if you have already had  a blood clot.  Having major or lengthy surgery. This is especially true for surgery on the hip, knee, or belly (abdomen). Hip surgery is particularly high risk.  Having a long, thin tube (catheter) placed inside a vein during a medical procedure.  Breaking a hip or leg.  Having cancer or cancer treatment.  Medicines containing the male hormone estrogen. This includes birth control pills and hormone replacement therapy.  Other circulation or heart problems.  Pregnancy and childbirth.  Hormone changes make the blood clot more easily during pregnancy.  The fetus puts pressure on the veins of the pelvis.  There is a risk of injury to veins during delivery or a caesarean delivery. The risk is highest just after childbirth.  PREVENTION   Exercise the legs regularly. Take a brisk 30 minute walk every day.  Maintain a weight that is appropriate for your height.  Avoid sitting or lying in bed for long periods of time without moving your legs.  Women, particularly those over the age of 19 years, should consider the risks and benefits of taking estrogen medicines, including birth control pills.  Do not smoke, especially if you take estrogen medicines.  Long-distance travel can increase your risk. You should exercise your  legs by walking or pumping the muscles every hour.  Many of the risk factors above relate to situations that exist with hospitalization, either for illness, injury, or elective surgery. Prevention may include medical and nonmedical measures.   Your health care provider will assess you for the need for venous thromboembolism prevention when you are admitted to the hospital. If you are having surgery, your surgeon will assess you the day of or day after surgery.  SYMPTOMS  The symptoms of a PE usually start suddenly and include:  Shortness of breath.  Coughing.  Coughing up blood or blood-tinged mucus.  Chest pain. Pain is often worse with deep  breaths.  Rapid heartbeat. DIAGNOSIS  If a PE is suspected, your health care provider will take a medical history and perform a physical exam. Other tests that may be required include:  Blood tests, such as studies of the clotting properties of your blood.  Imaging tests, such as ultrasound, CT, MRI, and other tests to see if you have clots in your legs or lungs.  An electrocardiogram. This can look for heart strain from blood clots in the lungs. TREATMENT   The most common treatment for a PE is blood thinning (anticoagulant) medicine, which reduces the blood's tendency to clot. Anticoagulants can stop new blood clots from forming and old clots from growing. They cannot dissolve existing clots. Your body does this by itself over time. Anticoagulants can be given by mouth, through an intravenous (IV) tube, or by injection. Your health care provider will determine the best program for you.  Less commonly, clot-dissolving medicines (thrombolytics) are used to dissolve a PE. They carry a high risk of bleeding, so they are used mainly in severe cases.  Very rarely, a blood clot in the leg needs to be removed surgically.  If you are unable to take anticoagulants, your health care provider may arrange for you to have a filter placed in a main vein in your abdomen. This filter prevents clots from traveling to your lungs. HOME CARE INSTRUCTIONS   Take all medicines as directed by your health care provider.  Learn as much as you can about DVT.  Wear a medical alert bracelet or carry a medical alert card.  Ask your health care provider how soon you can go back to normal activities. It is important to stay active to prevent blood clots. If you are on anticoagulant medicine, avoid contact sports.  It is very important to exercise. This is especially important while traveling, sitting, or standing for long periods of time. Exercise your legs by walking or by tightening and relaxing your leg muscles  regularly. Take frequent walks.  You may need to wear compression stockings. These are tight elastic stockings that apply pressure to the lower legs. This pressure can help keep the blood in the legs from clotting. Taking Warfarin Warfarin is a daily medicine that is taken by mouth. Your health care provider will advise you on the length of treatment (usually 3-6 months, sometimes lifelong). If you take warfarin:  Understand how to take warfarin and foods that can affect how warfarin works in Veterinary surgeon.  Too much and too little warfarin are both dangerous. Too much warfarin increases the risk of bleeding. Too little warfarin continues to allow the risk for blood clots. Warfarin and Regular Blood Testing While taking warfarin, you will need to have regular blood tests to measure your blood clotting time. These blood tests usually include both the prothrombin time (PT) and international  normalized ratio (INR) tests. The PT and INR results allow your health care provider to adjust your dose of warfarin. It is very important that you have your PT and INR tested as often as directed by your health care provider.  Warfarin and Your Diet Avoid major changes in your diet, or notify your health care provider before changing your diet. Arrange a visit with a registered dietitian to answer your questions. Many foods, especially foods high in vitamin K, can interfere with warfarin and affect the PT and INR results. You should eat a consistent amount of foods high in vitamin K. Foods high in vitamin K include:   Spinach, kale, broccoli, cabbage, collard and turnip greens, Brussels sprouts, peas, cauliflower, seaweed, and parsley.  Beef and pork liver.  Green tea.  Soybean oil. Warfarin with Other Medicines Many medicines can interfere with warfarin and affect the PT and INR results. You must:  Tell your health care provider about any and all medicines, vitamins, and supplements you take, including aspirin  and other over-the-counter anti-inflammatory medicines. Be especially cautious with aspirin and anti-inflammatory medicines. Ask your health care provider before taking these.  Do not take or discontinue any prescribed or over-the-counter medicine except on the advice of your health care provider or pharmacist. Warfarin Side Effects Warfarin can have side effects, such as easy bruising and difficulty stopping bleeding. Ask your health care provider or pharmacist about other side effects of warfarin. You will need to:  Hold pressure over cuts for longer than usual.  Notify your dentist and other health care providers that you are taking warfarin before you undergo any procedures where bleeding may occur. Warfarin with Alcohol and Tobacco   Drinking alcohol frequently can increase the effect of warfarin, leading to excess bleeding. It is best to avoid alcoholic drinks or consume only very small amounts while taking warfarin. Notify your health care provider if you change your alcohol intake.  Do not use any tobacco products including cigarettes, chewing tobacco, or electronic cigarettes. If you smoke, quit. Ask your health care provider for help with quitting smoking. Alternative Medicines to Warfarin: Factor Xa Inhibitor Medicines  These blood thinning medicines are taken by mouth, usually for several weeks or longer. It is important to take the medicine every single day, at the same time each day.  There are no regular blood tests required when using these medicines.  There are fewer food and drug interactions than with warfarin.  The side effects of this class of medicine is similar to that of warfarin, including excessive bruising or bleeding. Ask your health care provider or pharmacist about other potential side effects. SEEK MEDICAL CARE IF:   You notice a rapid heartbeat.  You feel weaker or more tired than usual.  You feel faint.  You notice increased bruising.  Your symptoms  are not getting better in the time expected.  You are having side effects of medicine. SEEK IMMEDIATE MEDICAL CARE IF:   You have chest pain.  You have trouble breathing.  You have new or increased swelling or pain in one leg.  You cough up blood.  You notice blood in vomit, in a bowel movement, or in urine.  You have a fever. Symptoms of PE may represent a serious problem that is an emergency. Do not wait to see if the symptoms will go away. Get medical help right away. Call your local emergency services (911 in the [place]). Do not drive yourself to the hospital. Document Released:  08/31/2000 Document Revised: 01/18/2014 Document Reviewed: 09/14/2013 [facility] Patient Information 2015 [place], [place]. This information is not intended to replace advice given to you by your health care provider. Make sure you discuss any questions you have with your health care provider.   Deep Vein Thrombosis A deep vein thrombosis (DVT) is a blood clot that develops in the deep, larger veins of the leg, arm, or pelvis. These are more dangerous than clots that might form in veins near the surface of the body. A DVT can lead to serious and even life-threatening complications if the clot breaks off and travels in the bloodstream to the lungs.  A DVT can damage the valves in your leg veins so that instead of flowing upward, the blood pools in the lower leg. This is called post-thrombotic syndrome, and it can result in pain, swelling, discoloration, and sores on the leg. CAUSES Usually, several things contribute to the formation of blood clots. Contributing factors include:  The flow of blood slows down.  The inside of the vein is damaged in some way.  You have a condition that makes blood clot more easily. RISK FACTORS Some people are more likely than others to develop blood clots. Risk factors include:   Smoking.  Being overweight (obese).  Sitting or lying still for a long time. This  includes long-distance travel, paralysis, or recovery from an illness or surgery. Other factors that increase risk are:   Older age, especially over 55 years of age.  Having a family history of blood clots or if you have already had a blot clot.  Having major or lengthy surgery. This is especially true for surgery on the hip, knee, or belly (abdomen). Hip surgery is particularly high risk.  Having a long, thin tube (catheter) placed inside a vein during a medical procedure.  Breaking a hip or leg.  Having cancer or cancer treatment.  Pregnancy and childbirth.  Hormone changes make the blood clot more easily during pregnancy.  The fetus puts pressure on the veins of the pelvis.  There is a risk of injury to veins during delivery or a caesarean delivery. The risk is highest just after childbirth.  Medicines containing the male hormone estrogen. This includes birth control pills and hormone replacement therapy.  Other circulation or heart problems.  SIGNS AND SYMPTOMS When a clot forms, it can either partially or totally block the blood flow in that vein. Symptoms of a DVT can include:  Swelling of the leg or arm, especially if one side is much worse.  Warmth and redness of the leg or arm, especially if one side is much worse.  Pain in an arm or leg. If the clot is in the leg, symptoms may be more noticeable or worse when standing or walking. The symptoms of a DVT that has traveled to the lungs (pulmonary embolism, PE) usually start suddenly and include:  Shortness of breath.  Coughing.  Coughing up blood or blood-tinged mucus.  Chest pain. The chest pain is often worse with deep breaths.  Rapid heartbeat. Anyone with these symptoms should get emergency medical treatment right away. Do not wait to see if the symptoms will go away. Call your local emergency services (911 in the [place] if you have these symptoms. Do not drive yourself to the hospital. DIAGNOSIS If a DVT is  suspected, your health care provider will take a full medical history and perform a physical exam. Tests that also may be required include:  Blood tests, including studies  of the clotting properties of the blood.  Ultrasound to see if you have clots in your legs or lungs.  X-rays to show the flow of blood when dye is injected into the veins (venogram).  Studies of your lungs if you have any chest symptoms. PREVENTION  Exercise the legs regularly. Take a brisk 30-minute walk every day.  Maintain a weight that is appropriate for your height.  Avoid sitting or lying in bed for long periods of time without moving your legs.  Women, particularly those over the age of 60 years, should consider the risks and benefits of taking estrogen medicines, including birth control pills.  Do not smoke, especially if you take estrogen medicines.  Long-distance travel can increase your risk of DVT. You should exercise your legs by walking or pumping the muscles every hour.  Many of the risk factors above relate to situations that exist with hospitalization, either for illness, injury, or elective surgery. Prevention may include medical and nonmedical measures.  Your health care provider will assess you for the need for venous thromboembolism prevention when you are admitted to the hospital. If you are having surgery, your surgeon will assess you the day of or day after surgery. TREATMENT Once identified, a DVT can be treated. It can also be prevented in some circumstances. Once you have had a DVT, you may be at increased risk for a DVT in the future. The most common treatment for DVT is blood-thinning (anticoagulant) medicine, which reduces the blood's tendency to clot. Anticoagulants can stop new blood clots from forming and stop old clots from growing. They cannot dissolve existing clots. Your body does this by itself over time. Anticoagulants can be given by mouth, through an IV tube, or by injection. Your  health care provider will determine the best program for you. Other medicines or treatments that may be used are:  Heparin or related medicines (low molecular weight heparin) are often the first treatment for a blood clot. They act quickly. However, they cannot be taken orally and must be given either in shot form or by IV tube.  Heparin can cause a fall in a component of blood that stops bleeding and forms blood clots (platelets). You will be monitored with blood tests to be sure this does not occur.  Warfarin is an anticoagulant that can be swallowed. It takes a few days to start working, so usually heparin or related medicines are used in combination. Once warfarin is working, heparin is usually stopped.  Factor Xa inhibitor medicines, such as rivaroxaban and apixaban, also reduce blood clotting. These medicines are taken orally and can often be used without heparin or related medicines.  Less commonly, clot dissolving drugs (thrombolytics) are used to dissolve a DVT. They carry a high risk of bleeding, so they are used mainly in severe cases where your life or a part of your body is threatened.  Very rarely, a blood clot in the leg needs to be removed surgically.  If you are unable to take anticoagulants, your health care provider may arrange for you to have a filter placed in a main vein in your abdomen. This filter prevents clots from traveling to your lungs. HOME CARE INSTRUCTIONS  Take all medicines as directed by your health care provider.  Learn as much as you can about DVT.  Wear a medical alert bracelet or carry a medical alert card.  Ask your health care provider how soon you can go back to normal activities. It is important  to stay active to prevent blood clots. If you are on anticoagulant medicine, avoid contact sports.  It is very important to exercise. This is especially important while traveling, sitting, or standing for long periods of time. Exercise your legs by walking  or by tightening and relaxing your leg muscles regularly. Take frequent walks.  You may need to wear compression stockings. These are tight elastic stockings that apply pressure to the lower legs. This pressure can help keep the blood in the legs from clotting. Taking Warfarin Warfarin is a daily medicine that is taken by mouth. Your health care provider will advise you on the length of treatment (usually 3-6 months, sometimes lifelong). If you take warfarin:  Understand how to take warfarin and foods that can affect how warfarin works in Veterinary surgeon.  Too much and too little warfarin are both dangerous. Too much warfarin increases the risk of bleeding. Too little warfarin continues to allow the risk for blood clots. Warfarin and Regular Blood Testing While taking warfarin, you will need to have regular blood tests to measure your blood clotting time. These blood tests usually include both the prothrombin time (PT) and international normalized ratio (INR) tests. The PT and INR results allow your health care provider to adjust your dose of warfarin. It is very important that you have your PT and INR tested as often as directed by your health care provider.  Warfarin and Your Diet Avoid major changes in your diet, or notify your health care provider before changing your diet. Arrange a visit with a registered dietitian to answer your questions. Many foods, especially foods high in vitamin K, can interfere with warfarin and affect the PT and INR results. You should eat a consistent amount of foods high in vitamin K. Foods high in vitamin K include:   Spinach, kale, broccoli, cabbage, collard and turnip greens, Brussels sprouts, peas, cauliflower, seaweed, and parsley.  Beef and pork liver.  Green tea.  Soybean oil. Warfarin with Other Medicines Many medicines can interfere with warfarin and affect the PT and INR results. You must:  Tell your health care provider about any and all medicines,  vitamins, and supplements you take, including aspirin and other over-the-counter anti-inflammatory medicines. Be especially cautious with aspirin and anti-inflammatory medicines. Ask your health care provider before taking these.  Do not take or discontinue any prescribed or over-the-counter medicine except on the advice of your health care provider or pharmacist. Warfarin Side Effects Warfarin can have side effects, such as easy bruising and difficulty stopping bleeding. Ask your health care provider or pharmacist about other side effects of warfarin. You will need to:  Hold pressure over cuts for longer than usual.  Notify your dentist and other health care providers that you are taking warfarin before you undergo any procedures where bleeding may occur. Warfarin with Alcohol and Tobacco   Drinking alcohol frequently can increase the effect of warfarin, leading to excess bleeding. It is best to avoid alcoholic drinks or to consume only very small amounts while taking warfarin. Notify your health care provider if you change your alcohol intake.   Do not use any tobacco products including cigarettes, chewing tobacco, or electronic cigarettes. If you smoke, quit. Ask your health care provider for help with quitting smoking. Alternative Medicines to Warfarin: Factor Xa Inhibitor Medicines  These blood-thinning medicines are taken by mouth, usually for several weeks or longer. It is important to take the medicine every single day at the same time each day.  There are no regular blood tests required when using these medicines.  There are fewer food and drug interactions than with warfarin.  The side effects of this class of medicine are similar to those of warfarin, including excessive bruising or bleeding. Ask your health care provider or pharmacist about other potential side effects. SEEK MEDICAL CARE IF:  You notice a rapid heartbeat.  You feel weaker or more tired than usual.  You feel  faint.  You notice increased bruising.  You feel your symptoms are not getting better in the time expected.  You believe you are having side effects of medicine. SEEK IMMEDIATE MEDICAL CARE IF:  You have chest pain.  You have trouble breathing.  You have new or increased swelling or pain in one leg.  You cough up blood.  You notice blood in vomit, in a bowel movement, or in urine. MAKE SURE YOU:  Understand these instructions.  Will watch your condition.  Will get help right away if you are not doing well or get worse. Document Released: 09/03/2005 Document Revised: 01/18/2014 Document Reviewed: 05/11/2013 [facility] Patient Information 2015 [place], [place]. This information is not intended to replace advice given to you by your health care provider. Make sure you discuss any questions you have with your health care provider.

## 2014-04-16 NOTE — Progress Notes (Signed)
Occupational Therapy Evaluation Patient Details Name: Daniel Ochoa MRN: [id number] DOB: 07-28-1941 Today's Date: 04/16/2014    History of Present Illness Daniel Ochoa is a 73 y.o. Male admitted 04/13/14 with shortness of breath. Pt was found to be midly hypoxic upon admission and presented with LE DVT. VQ scan was intermeidate probability for PE. Pt has demonstrated desat to mid 80's during ambulation on room air.    Clinical Impression   PTA pt lived at home with his wife and was independent with use of SPC for ADLs and functional mobility. Pt also reports that he has a reacher and sock aid from previous back surgery, which he uses. Pt moves well, however easily becomes fatigued and SOB with wheezing noticed during mobility. Educated pt on proper use of [place] and breathing through nose deeply and slowly for increased SPO2. Educated pt on fall prevention and ways to increase safety with ADLs through energy conservation, including sitting for dressing and bathing and taking rest breaks around the house. Pt would benefit from [facility] for environmental modifications and increased endurance. No further acute OT needs.     Follow Up Recommendations  Home health OT;Supervision/Assistance - 24 hour    Equipment Recommendations  Other (comment) (TBD by HHOT)       Precautions / Restrictions Precautions Precautions: Fall Restrictions Weight Bearing Restrictions: No      Mobility Bed Mobility Overal bed mobility: Modified Independent             General bed mobility comments: Pt demonstrated his technique that he uses at home with [facility] elevated and no use of bed rails. Pt rcoked in bed before being able to move legs off bed and support trunk and became SOB with slight wheezing noticed. Pt encouraged to breathe deeply through nose. Sitting EOB following supine>sit SPO2 was 91% and returned to 96% following deep breathing through his nose.   Transfers Overall transfer level: Modified  independent Equipment used: None             General transfer comment: Pt able to sit<>stand from EOB x5 to increase endurance and practice deep breathing. Pt able to scoot to EOB and push up to stand without VC's.     Balance Overall balance assessment: No apparent balance deficits (not formally assessed)                                          ADL Overall ADL's : Needs assistance/impaired Eating/Feeding: Independent;Sitting   Grooming: Standing;Supervision/safety   Upper Body Bathing: Sitting;Modified independent   Lower Body Bathing: Supervison/ safety;Sit to/from stand   Upper Body Dressing : Modified independent;Sitting   Lower Body Dressing: Supervision/safety;Sit to/from stand   Toilet Transfer: Modified Independent;Ambulation [facility])   Toileting- Clothing Manipulation and Hygiene: Modified independent;Sit to/from stand   Tub/ Banker: Walk-in shower;Modified independent;Ambulation [facility])   Functional mobility during ADLs: Modified independent (SPC) General ADL Comments: Educated pt and wife on energy conservation techniques and fall prevention strategies. Discussed sitting EOB following bed mobility for deep breathing exercises prior to ambulation as pt SPO2 decreased following bed mobility. Pt moves well and uses SPC. Pt reports that he has a sock aid and reacher to assist with LB ADLs, although he typically reaches down to his feet for bathing and dressing. Encouraged pt to use AE for increased safety. Reinforced pt to breathe deeply through his nose during movement to  increase flow from [place] rather than through his mouth.      Vision  Pt wears glasses at all times.  Pt reports no change from baseline.  No apparent visual deficits.                Perception Perception Perception Tested?: No   Praxis Praxis Praxis tested?: Within functional limits    Pertinent Vitals/Pain No c/o pain. Following bed mobility SPO2 dropped to 90%  with [place] on 3.5 L oxygen. Returned to 96% following deep breathing sitting EOB.       Hand Dominance Right   Extremity/Trunk Assessment Upper Extremity Assessment Upper Extremity Assessment: Generalized weakness;RUE deficits/detail RUE Deficits / Details: premorbid: pt has hx of injured rotator cuff and has AROM to about 90 degrees (full PROM).    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)   Communication Communication Communication: HOH   Cognition Arousal/Alertness: Awake/alert Behavior During Therapy: WFL for tasks assessed/performed Overall Cognitive Status: Within Functional Limits for tasks assessed                                Home Living Family/patient expects to be discharged to:: Private residence Living Arrangements: Spouse/significant other Available Help at Discharge: Family;Available 24 hours/day (wife has back problems) Type of Home: House Home Access: Level entry     Home Layout: One level     Bathroom Shower/Tub: Walk-in shower;Door   [employer] Toilet: Standard     Home Equipment: Environmental consultant - 2 wheels;Cane - single point;Shower seat - built in;Grab bars - tub/shower;Hand held shower head          Prior Functioning/Environment Level of Independence: Independent with assistive device(s)        Comments: pt used SPC for safety with ambulation.                               End of Session Equipment Utilized During Treatment: Gait belt  Activity Tolerance: Patient tolerated treatment well Patient left: with nursing/sitter in room;with family/visitor present;with call bell/phone within reach;Other (comment) (sitting EOB)   Time: [id number] OT Time Calculation (min): 35 min Charges:  OT General Charges $OT Visit: 1 Procedure OT Evaluation $Initial OT Evaluation  Tier I: 1 Procedure OT Treatments $Self Care/Home Management : 8-22 mins $Therapeutic Activity: 8-22 mins  Juluis Rainier [id number] 04/16/2014, 11:12 AM

## 2014-04-19 DIAGNOSIS — I2699 Other pulmonary embolism without acute cor pulmonale: Secondary | ICD-10-CM | POA: Diagnosis not present

## 2014-04-19 DIAGNOSIS — Z7901 Long term (current) use of anticoagulants: Secondary | ICD-10-CM | POA: Diagnosis not present

## 2014-04-19 NOTE — ED Provider Notes (Signed)
Medical screening examination/treatment/procedure(s) were conducted as a shared visit with non-physician practitioner(s) and myself.  I personally evaluated the patient during the encounter.   EKG Interpretation   Date/Time:  Tuesday April 13 2014 12:07:14 EDT Ventricular Rate:  96 PR Interval:  168 QRS Duration: 90 QT Interval:  378 QTC Calculation: 477 R Axis:   12 Text Interpretation:  Normal sinus rhythm Nonspecific ST abnormality  Abnormal ECG No significant change was found Confirmed by Ravynn Hogate  MD,  Lennette Bihari ([id number]) on 04/13/2014 2:41:17 PM      dvt with likely PE given clinical hx. Unable to perform CTA secondary to renal insufficiency. Initiate heparin. VQ scan pending  CRITICAL CARE Performed by: Hoy Morn Total critical care time: 32 Critical care time was exclusive of separately billable procedures and treating other patients. Critical care was necessary to treat or prevent imminent or life-threatening deterioration. Critical care was time spent personally by me on the following activities: development of treatment plan with patient and/or surrogate as well as nursing, discussions with consultants, evaluation of patient's response to treatment, examination of patient, obtaining history from patient or surrogate, ordering and performing treatments and interventions, ordering and review of laboratory studies, ordering and review of radiographic studies, pulse oximetry and re-evaluation of patient's condition.  1. Shortness of breath   2. Elevated d-dimer   3. Hypoxia   4. Acute renal failure, unspecified acute renal failure type   5. PE (pulmonary embolism)   6. Acute deep vein thrombosis (DVT) of tibial vein of right lower extremity   7. Chronic kidney disease, stage IV (severe)   8. DM (diabetes mellitus), type 2, uncontrolled, with renal complications   9. Acute pulmonary embolism       Hoy Morn, MD 04/19/14 2312

## 2014-04-23 DIAGNOSIS — I2699 Other pulmonary embolism without acute cor pulmonale: Secondary | ICD-10-CM | POA: Diagnosis not present

## 2014-04-23 DIAGNOSIS — Z7901 Long term (current) use of anticoagulants: Secondary | ICD-10-CM | POA: Diagnosis not present

## 2014-04-27 DIAGNOSIS — T81718A Complication of other artery following a procedure, not elsewhere classified, initial encounter: Secondary | ICD-10-CM | POA: Diagnosis not present

## 2014-04-27 DIAGNOSIS — Z7901 Long term (current) use of anticoagulants: Secondary | ICD-10-CM | POA: Diagnosis not present

## 2014-04-27 DIAGNOSIS — I2699 Other pulmonary embolism without acute cor pulmonale: Secondary | ICD-10-CM | POA: Diagnosis not present

## 2014-04-29 DIAGNOSIS — S60229A Contusion of unspecified hand, initial encounter: Secondary | ICD-10-CM | POA: Diagnosis not present

## 2014-04-29 DIAGNOSIS — T148XXA Other injury of unspecified body region, initial encounter: Secondary | ICD-10-CM | POA: Diagnosis not present

## 2014-04-29 DIAGNOSIS — S51809A Unspecified open wound of unspecified forearm, initial encounter: Secondary | ICD-10-CM | POA: Diagnosis not present

## 2014-05-04 DIAGNOSIS — E1129 Type 2 diabetes mellitus with other diabetic kidney complication: Secondary | ICD-10-CM | POA: Diagnosis not present

## 2014-05-04 DIAGNOSIS — I2699 Other pulmonary embolism without acute cor pulmonale: Secondary | ICD-10-CM | POA: Diagnosis not present

## 2014-05-04 DIAGNOSIS — R0902 Hypoxemia: Secondary | ICD-10-CM | POA: Diagnosis not present

## 2014-05-04 DIAGNOSIS — G4733 Obstructive sleep apnea (adult) (pediatric): Secondary | ICD-10-CM | POA: Diagnosis not present

## 2014-05-04 DIAGNOSIS — N184 Chronic kidney disease, stage 4 (severe): Secondary | ICD-10-CM | POA: Diagnosis not present

## 2014-05-04 DIAGNOSIS — Z7901 Long term (current) use of anticoagulants: Secondary | ICD-10-CM | POA: Diagnosis not present

## 2014-05-04 DIAGNOSIS — I82409 Acute embolism and thrombosis of unspecified deep veins of unspecified lower extremity: Secondary | ICD-10-CM | POA: Diagnosis not present

## 2014-05-04 DIAGNOSIS — I519 Heart disease, unspecified: Secondary | ICD-10-CM | POA: Diagnosis not present

## 2014-05-05 DIAGNOSIS — E1142 Type 2 diabetes mellitus with diabetic polyneuropathy: Secondary | ICD-10-CM | POA: Diagnosis not present

## 2014-05-05 DIAGNOSIS — E039 Hypothyroidism, unspecified: Secondary | ICD-10-CM | POA: Diagnosis not present

## 2014-05-05 DIAGNOSIS — I2699 Other pulmonary embolism without acute cor pulmonale: Secondary | ICD-10-CM | POA: Diagnosis not present

## 2014-05-05 DIAGNOSIS — I10 Essential (primary) hypertension: Secondary | ICD-10-CM | POA: Diagnosis not present

## 2014-05-05 DIAGNOSIS — E1149 Type 2 diabetes mellitus with other diabetic neurological complication: Secondary | ICD-10-CM | POA: Diagnosis not present

## 2014-05-17 ENCOUNTER — Other Ambulatory Visit: Payer: Self-pay | Admitting: Family Medicine

## 2014-05-17 ENCOUNTER — Ambulatory Visit
Admission: RE | Admit: 2014-05-17 | Discharge: 2014-05-17 | Disposition: A | Discharge status: Home / Self Care | Payer: Medicare Other | Source: Ambulatory Visit | Attending: Family Medicine | Admitting: Family Medicine

## 2014-05-17 DIAGNOSIS — M545 Low back pain, unspecified: Secondary | ICD-10-CM | POA: Diagnosis not present

## 2014-05-17 DIAGNOSIS — M549 Dorsalgia, unspecified: Secondary | ICD-10-CM

## 2014-05-17 DIAGNOSIS — IMO0002 Reserved for concepts with insufficient information to code with codable children: Secondary | ICD-10-CM | POA: Diagnosis not present

## 2014-05-17 DIAGNOSIS — M546 Pain in thoracic spine: Secondary | ICD-10-CM | POA: Diagnosis not present

## 2014-05-21 DIAGNOSIS — S22009A Unspecified fracture of unspecified thoracic vertebra, initial encounter for closed fracture: Secondary | ICD-10-CM | POA: Diagnosis not present

## 2014-05-25 DIAGNOSIS — I2699 Other pulmonary embolism without acute cor pulmonale: Secondary | ICD-10-CM | POA: Diagnosis not present

## 2014-05-25 DIAGNOSIS — Z7901 Long term (current) use of anticoagulants: Secondary | ICD-10-CM | POA: Diagnosis not present

## 2014-06-08 DIAGNOSIS — B354 Tinea corporis: Secondary | ICD-10-CM | POA: Diagnosis not present

## 2014-06-08 DIAGNOSIS — I2699 Other pulmonary embolism without acute cor pulmonale: Secondary | ICD-10-CM | POA: Diagnosis not present

## 2014-06-08 DIAGNOSIS — I82409 Acute embolism and thrombosis of unspecified deep veins of unspecified lower extremity: Secondary | ICD-10-CM | POA: Diagnosis not present

## 2014-06-08 DIAGNOSIS — E1129 Type 2 diabetes mellitus with other diabetic kidney complication: Secondary | ICD-10-CM | POA: Diagnosis not present

## 2014-06-08 DIAGNOSIS — Z7901 Long term (current) use of anticoagulants: Secondary | ICD-10-CM | POA: Diagnosis not present

## 2014-06-08 DIAGNOSIS — M545 Low back pain, unspecified: Secondary | ICD-10-CM | POA: Diagnosis not present

## 2014-06-08 DIAGNOSIS — IMO0002 Reserved for concepts with insufficient information to code with codable children: Secondary | ICD-10-CM | POA: Diagnosis not present

## 2014-06-08 DIAGNOSIS — I519 Heart disease, unspecified: Secondary | ICD-10-CM | POA: Diagnosis not present

## 2014-06-21 DIAGNOSIS — I272 Other secondary pulmonary hypertension: Secondary | ICD-10-CM | POA: Diagnosis not present

## 2014-06-21 DIAGNOSIS — I2699 Other pulmonary embolism without acute cor pulmonale: Secondary | ICD-10-CM | POA: Diagnosis not present

## 2014-06-21 DIAGNOSIS — Z86711 Personal history of pulmonary embolism: Secondary | ICD-10-CM | POA: Diagnosis not present

## 2014-06-21 DIAGNOSIS — R0609 Other forms of dyspnea: Secondary | ICD-10-CM | POA: Diagnosis not present

## 2014-06-22 DIAGNOSIS — N184 Chronic kidney disease, stage 4 (severe): Secondary | ICD-10-CM | POA: Diagnosis not present

## 2014-06-22 DIAGNOSIS — Z7901 Long term (current) use of anticoagulants: Secondary | ICD-10-CM | POA: Diagnosis not present

## 2014-06-22 DIAGNOSIS — I2699 Other pulmonary embolism without acute cor pulmonale: Secondary | ICD-10-CM | POA: Diagnosis not present

## 2014-06-24 DIAGNOSIS — S22080D Wedge compression fracture of T11-T12 vertebra, subsequent encounter for fracture with routine healing: Secondary | ICD-10-CM | POA: Diagnosis not present

## 2014-06-29 DIAGNOSIS — Z5181 Encounter for therapeutic drug level monitoring: Secondary | ICD-10-CM | POA: Diagnosis not present

## 2014-06-29 DIAGNOSIS — I2699 Other pulmonary embolism without acute cor pulmonale: Secondary | ICD-10-CM | POA: Diagnosis not present

## 2014-07-08 DIAGNOSIS — I129 Hypertensive chronic kidney disease with stage 1 through stage 4 chronic kidney disease, or unspecified chronic kidney disease: Secondary | ICD-10-CM | POA: Diagnosis not present

## 2014-07-08 DIAGNOSIS — E1122 Type 2 diabetes mellitus with diabetic chronic kidney disease: Secondary | ICD-10-CM | POA: Diagnosis not present

## 2014-07-08 DIAGNOSIS — Z5181 Encounter for therapeutic drug level monitoring: Secondary | ICD-10-CM | POA: Diagnosis not present

## 2014-07-08 DIAGNOSIS — N184 Chronic kidney disease, stage 4 (severe): Secondary | ICD-10-CM | POA: Diagnosis not present

## 2014-07-08 DIAGNOSIS — I82409 Acute embolism and thrombosis of unspecified deep veins of unspecified lower extremity: Secondary | ICD-10-CM | POA: Diagnosis not present

## 2014-07-15 ENCOUNTER — Telehealth: Payer: Self-pay | Admitting: Internal Medicine

## 2014-07-15 NOTE — Telephone Encounter (Signed)
Error.  Wrong pt.  Satira Anis

## 2014-07-22 DIAGNOSIS — Z86711 Personal history of pulmonary embolism: Secondary | ICD-10-CM | POA: Diagnosis not present

## 2014-07-22 DIAGNOSIS — I82409 Acute embolism and thrombosis of unspecified deep veins of unspecified lower extremity: Secondary | ICD-10-CM | POA: Diagnosis not present

## 2014-07-22 DIAGNOSIS — I272 Other secondary pulmonary hypertension: Secondary | ICD-10-CM | POA: Diagnosis not present

## 2014-07-22 DIAGNOSIS — Z7901 Long term (current) use of anticoagulants: Secondary | ICD-10-CM | POA: Diagnosis not present

## 2014-07-23 ENCOUNTER — Ambulatory Visit (INDEPENDENT_AMBULATORY_CARE_PROVIDER_SITE_OTHER): Payer: Medicare Other | Admitting: Internal Medicine

## 2014-07-23 ENCOUNTER — Encounter: Payer: Self-pay | Admitting: Internal Medicine

## 2014-07-23 VITALS — BP 138/58 | HR 62 | Ht 69.0 in | Wt 322.8 lb

## 2014-07-23 DIAGNOSIS — R0600 Dyspnea, unspecified: Secondary | ICD-10-CM | POA: Insufficient documentation

## 2014-07-23 DIAGNOSIS — I10 Essential (primary) hypertension: Secondary | ICD-10-CM

## 2014-07-23 DIAGNOSIS — I2699 Other pulmonary embolism without acute cor pulmonale: Secondary | ICD-10-CM | POA: Diagnosis not present

## 2014-07-23 MED ORDER — IRBESARTAN 75 MG PO TABS: 75.0000 mg | ORAL_TABLET | Freq: Every day | ORAL | Status: DC

## 2014-07-23 NOTE — Progress Notes (Signed)
   Subjective:    Patient ID: Daniel Ochoa, male    DOB: [birth year],   MRN: [id number]  HPI  12 yowm quit smoking 1993 s resp problems around 220 then gradual onset doe much worse since July 2015 so referred by Dr Antony Blackbird to pulmonary clinic 07/23/2014 followeing clinical dx of PE in July 2015    07/23/2014 1st [facility] office visit/ W[place]   Chief Complaint  Patient presents with  . Advice Only    Referred by Dr. Antony Blackbird; DOE  chronic doe mailbox and sit down = baseline  Can't do [employer] as easily as used to, pace has slowed but can still get through it s sitting down due to sob  Onset was insidious, gradually worse x years s pattern of exacerbations or noct wheezing  Not clearly worse with PE which caused more of a sensation of presyncope and weakness   No obvious day to day or daytime variabilty or assoc cp or chest tightness, subjective wheeze overt sinus or hb symptoms. No unusual exp hx or h/o childhood pna/ asthma or knowledge of premature birth.  Sleeping ok without nocturnal  or early am exacerbation  of respiratory  c/o's or need for noct saba. Also denies any obvious fluctuation of symptoms with weather or environmental changes or other aggravating or alleviating factors except as outlined above   Does describe intermittent dry cough and choking sensation since on acei but "not that bad"  Current Medications, Allergies, Complete Past Medical History, Past Surgical History, Family History, and Social History were reviewed in [employer] record.               Review of Systems  Constitutional: Negative for fever and unexpected weight change.  HENT: Negative for congestion, dental problem, ear pain, nosebleeds, postnasal drip, rhinorrhea, sinus pressure, sneezing, sore throat and trouble swallowing.   Eyes: Negative for redness and itching.  Respiratory: Positive for cough and choking. Negative for chest tightness, shortness of breath  and wheezing.   Cardiovascular: Negative for palpitations.  Gastrointestinal: Negative for nausea and vomiting.  Genitourinary: Negative for dysuria.  Musculoskeletal: Negative for joint swelling.  Skin: Negative for rash.  Neurological: Negative for headaches.  Hematological: Does not bruise/bleed easily.  Psychiatric/Behavioral: Negative for dysphoric mood. The patient is not nervous/anxious.        Objective:   Physical Exam  Obese wm with mild psuedowheeze  Wt Readings from Last 3 Encounters:  07/23/14 322 lb 12.8 oz (146.421 kg)  04/16/14 317 lb 1.6 oz (143.836 kg)    Vital signs reviewed  HEENT: nl dentition, turbinates, and orophanx. Nl external ear canals without cough reflex   NECK :  without JVD/Nodes/TM/ nl carotid upstrokes bilaterally   LUNGS: no acc muscle use, clear to A and P bilaterally without cough on insp or exp maneuvers   CV:  RRR  no s3 or murmur or increase in P2, no pitting  edema   ABD:  Obese/soft and nontender with nl excursion in the supine position. No bruits or organomegaly, bowel sounds nl  MS:  warm without deformities, calf tenderness, cyanosis or clubbing  SKIN: warm and dry without lesions    NEURO:  alert, approp, no deficits   cxr 04/13/14  No active cardiopulmonary disease.     Assessment & Plan:

## 2014-07-23 NOTE — Patient Instructions (Addendum)
Stop lisinopril   Avapro (ibesartan) 75 mg daily in place of lisinopril x 4 weeks to see if sense of choking/cough/ or breathing get better or not (only way to tell)  Please schedule a follow up office visit in 4 weeks, sooner if needed with pfts on return

## 2014-07-24 NOTE — Assessment & Plan Note (Signed)
VQ 04/13/14 intermediate assoc with R DVT > on coumadin since Echo 04/14/14 > PA peak pressure: 89 mm Hg (S)  No signs of CTEPAH though he is at risk > needs repeat v/q and echo at 6 months > will set this up after returns for pfts

## 2014-07-24 NOTE — Assessment & Plan Note (Addendum)
ACE inhibitors are problematic in  pts with airway complaints because  even experienced pulmonologists can't always distinguish ace effects from copd/asthma.  By themselves they don't actually cause a problem, much like oxygen can't by itself start a fire, but they certainly serve as a powerful catalyst or enhancer for any "fire"  or inflammatory process in the upper airway, be it caused by an ET  tube or more commonly reflux (especially in the obese or pts with known GERD or who are on biphoshonates).    In the era of ARB near equivalency until we have a better handle on the reversibility of the airway problem, it just makes sense to avoid ACEI  entirely in the short run  - only way there is to make the dx is a minimum or 4 weeks off.   Try avapro 75 mg short term to see what difference if any this makes

## 2014-07-24 NOTE — Assessment & Plan Note (Addendum)
-   07/23/2014  Walked RA  2 laps @ 185 ft each stopped due to  Fatigue, not sob with ok sats/ slow pace  Symptoms are markedly disproportionate to objective findings and not clear this is a lung problem but pt does appear to have difficult airway management issues.   Adherence is always the initial "prime suspect" and is a multilayered concern that requires a "trust but verify" approach in every patient - starting with knowing how to use medications, especially inhalers, correctly, keeping up with refills and understanding the fundamental difference between maintenance and prns vs those medications only taken for a very short course and then stopped and not refilled.   ACEi at the top of the usual list of suspects at least for the intermittent cough/ choke symptoms > see hbp  ? Acid (or non-acid) GERD > always difficult to exclude as up to 75% of pts in some series report no assoc GI/ Heartburn symptoms> rec continue just pepcid for now and increase to 24 h rx if not better in 4 weeks off acei  A  PE did not seem to really affect his chronic sob as fatigue > see sep a/p

## 2014-08-05 DIAGNOSIS — E1129 Type 2 diabetes mellitus with other diabetic kidney complication: Secondary | ICD-10-CM | POA: Diagnosis not present

## 2014-08-05 DIAGNOSIS — Z23 Encounter for immunization: Secondary | ICD-10-CM | POA: Diagnosis not present

## 2014-08-05 DIAGNOSIS — I2699 Other pulmonary embolism without acute cor pulmonale: Secondary | ICD-10-CM | POA: Diagnosis not present

## 2014-08-05 DIAGNOSIS — Z7901 Long term (current) use of anticoagulants: Secondary | ICD-10-CM | POA: Diagnosis not present

## 2014-08-23 ENCOUNTER — Other Ambulatory Visit: Payer: Self-pay | Admitting: Internal Medicine

## 2014-08-23 DIAGNOSIS — R0600 Dyspnea, unspecified: Secondary | ICD-10-CM

## 2014-08-24 ENCOUNTER — Ambulatory Visit (INDEPENDENT_AMBULATORY_CARE_PROVIDER_SITE_OTHER): Payer: Medicare Other | Admitting: Internal Medicine

## 2014-08-24 ENCOUNTER — Encounter: Payer: Self-pay | Admitting: Internal Medicine

## 2014-08-24 VITALS — BP 122/60 | HR 68 | Ht 69.0 in | Wt 317.0 lb

## 2014-08-24 DIAGNOSIS — I10 Essential (primary) hypertension: Secondary | ICD-10-CM | POA: Diagnosis not present

## 2014-08-24 DIAGNOSIS — R0600 Dyspnea, unspecified: Secondary | ICD-10-CM

## 2014-08-24 DIAGNOSIS — Z5181 Encounter for therapeutic drug level monitoring: Secondary | ICD-10-CM | POA: Diagnosis not present

## 2014-08-24 DIAGNOSIS — I482 Chronic atrial fibrillation: Secondary | ICD-10-CM | POA: Diagnosis not present

## 2014-08-24 LAB — PULMONARY FUNCTION TEST
DL/VA % pred: 109 %
DL/VA: 4.96 ml/min/mmHg/L
DLCO unc % pred: 70 %
DLCO unc: 21.87 ml/min/mmHg
FEF 25-75 Post: 2.23 L/sec
FEF 25-75 Pre: 1.73 L/sec
FEF2575-%Change-Post: 28 %
FEF2575-%Pred-Post: 98 %
FEF2575-%Pred-Pre: 76 %
FEV1-%Change-Post: 5 %
FEV1-%Pred-Post: 66 %
FEV1-%Pred-Pre: 62 %
FEV1-Post: 2.02 L
FEV1-Pre: 1.91 L
FEV1FVC-%Change-Post: 0 %
FEV1FVC-%Pred-Pre: 108 %
FEV6-%Change-Post: 6 %
FEV6-%Pred-Post: 64 %
FEV6-%Pred-Pre: 60 %
FEV6-Post: 2.55 L
FEV6-Pre: 2.4 L
FEV6FVC-%Change-Post: 0 %
FEV6FVC-%Pred-Post: 106 %
FEV6FVC-%Pred-Pre: 106 %
FVC-%Change-Post: 6 %
FVC-%Pred-Post: 61 %
FVC-%Pred-Pre: 57 %
FVC-Post: 2.55 L
FVC-Pre: 2.4 L
Post FEV1/FVC ratio: 79 %
Post FEV6/FVC ratio: 100 %
Pre FEV1/FVC ratio: 80 %
Pre FEV6/FVC Ratio: 100 %
RV % pred: 98 %
RV: 2.39 L
TLC % pred: 70 %
TLC: 4.8 L

## 2014-08-24 MED ORDER — IRBESARTAN 75 MG PO TABS: 75.0000 mg | ORAL_TABLET | Freq: Every day | ORAL | Status: DC

## 2014-08-24 NOTE — Patient Instructions (Signed)
You do not have copd and you never will  Weight control is simply a matter of calorie balance which needs to be tilted in your favor by eating less and exercising more.  To get the most out of exercise, you need to be continuously aware that you are short of breath, but never out of breath, for 30 minutes daily. As you improve, it will actually be easier for you to do the same amount of exercise  in  30 minutes so always push to the level where you are short of breath.  If this does not result in gradual weight reduction then I strongly recommend you see a nutritionist with a food diary x 2 weeks so that we can work out a negative calorie balance which is universally effective in steady weight loss programs.  Think of your calorie balance like you do your bank account where in this case you want the balance to go down so you must take in less calories than you burn up.  It's just that simple:  Hard to do, but easy to understand.  Good luck!    If you are satisfied with your treatment plan,  let your doctor know and he/she can either refill your medications or you can return here when your prescription runs out.     If in any way you are not 100% satisfied,  please tell us.  If 100% better, tell your friends!  Pulmonary follow up is as needed

## 2014-08-24 NOTE — Progress Notes (Signed)
Subjective:    Patient ID: Daniel Ochoa, male    DOB: 1941/08/19,   MRN: [id number]    Brief patient profile:  60 yowm quit smoking 1993 s resp problems around 220 then gradual onset doe much worse since July 2015 so referred by Dr Antony Blackbird to pulmonary clinic 07/23/2014 following clinical dx of PE in July 2015 with pfts s airflow obst 08/24/14 and symptoms improved off ACEi   07/23/2014 1st [facility] office visit/ Daniel Ochoa   Chief Complaint  Patient presents with  . Advice Only    Referred by Dr. Antony Blackbird; DOE  chronic doe mailbox and sit down = baseline  Can't do [employer] as easily as used to, pace has slowed but can still get through it s sitting down due to sob  Onset was insidious, gradually worse x years s pattern of exacerbations or noct wheezing  Not clearly worse with PE which caused more of a sensation of presyncope and weakness rec Stop lisinopril  Avapro (ibesartan) 75 mg daily in place of lisinopril x 4 weeks to see if sense of choking/cough/ or breathing get better or not (only way to tell)    08/24/2014 f/u ov/Daniel Ochoa re: uacs/ improved off acei / pfts s obstr Chief Complaint  Patient presents with  . Follow-up    PFT done today. Breathing is slightly better. No new co's today.    mailbox and back better, now doing [employer], no more choking at all    No obvious day to day or daytime variabilty or assoc chronic cough or cp or chest tightness, subjective wheeze overt sinus or hb symptoms. No unusual exp hx or h/o childhood pna/ asthma or knowledge of premature birth.  Sleeping ok without nocturnal  or early am exacerbation  of respiratory  c/o's or need for noct saba. Also denies any obvious fluctuation of symptoms with weather or environmental changes or other aggravating or alleviating factors except as outlined above   Current Medications, Allergies, Complete Past Medical History, Past Surgical History, Family History, and Social History were reviewed in  [employer] record.  ROS  The following are not active complaints unless bolded sore throat, dysphagia, dental problems, itching, sneezing,  nasal congestion or excess/ purulent secretions, ear ache,   fever, chills, sweats, unintended wt loss, pleuritic or exertional cp, hemoptysis,  orthopnea pnd or leg swelling, presyncope, palpitations, heartburn, abdominal pain, anorexia, nausea, vomiting, diarrhea  or change in bowel or urinary habits, change in stools or urine, dysuria,hematuria,  rash, arthralgias, visual complaints, headache, numbness weakness or ataxia or problems with walking or coordination,  change in mood/affect or memory.           Objective:   Physical Exam  Obese amb wm nad   Wt Readings from Last 3 Encounters:  08/24/14 317 lb (143.79 kg)  07/23/14 322 lb 12.8 oz (146.421 kg)  04/16/14 317 lb 1.6 oz (143.836 kg)    Vital signs reviewed    HEENT: nl dentition, turbinates, and orophanx. Nl external ear canals without cough reflex   NECK :  without JVD/Nodes/TM/ nl carotid upstrokes bilaterally   LUNGS: no acc muscle use, clear to A and P bilaterally without cough on insp or exp maneuvers   CV:  RRR  no s3 or murmur or increase in P2, no pitting  edema   ABD:  Obese/soft and nontender with nl excursion in the supine position. No bruits or organomegaly, bowel sounds nl  MS:  warm without  deformities, calf tenderness, cyanosis or clubbing  SKIN: warm and dry without lesions    NEURO:  alert, approp, no deficits   cxr 04/13/14  No active cardiopulmonary disease.     Assessment & Plan:   Outpatient Encounter Prescriptions as of 08/24/2014  Medication Sig  . atenolol (TENORMIN) 50 MG tablet Take 50 mg by mouth 2 (two) times daily.   . clonazePAM (KLONOPIN) 2 MG tablet Take 2 mg by mouth daily.   Marland Kitchen donepezil (ARICEPT) 5 MG tablet Take 5 mg by mouth daily.   Marland Kitchen DYMISTA 137-50 MCG/ACT SUSP Place 1 spray into both nostrils 2 (two) times  daily.   . famotidine (PEPCID) 40 MG tablet Take 40 mg by mouth at bedtime.   Marland Kitchen HYDROcodone-acetaminophen (NORCO/VICODIN) 5-325 MG per tablet Take 1 tablet by mouth every 6 (six) hours.   . Insulin Human (INSULIN PUMP) SOLN Inject 1 each into the skin continuous. Humulin R  . irbesartan (AVAPRO) 75 MG tablet Take 1 tablet (75 mg total) by mouth daily.  Marland Kitchen loratadine (CLARITIN) 10 MG tablet Take 10 mg by mouth daily.  Marland Kitchen SYNTHROID 200 MCG tablet Take 200 mcg by mouth daily before breakfast.   . warfarin (COUMADIN) 2.5 MG tablet Take 10mg  (4 tablets) on 7/31 and 8/1. Then take 7.5mg  (3 tablets) on 8/2 and then as per your doctor's instructions on 8/3.  . [DISCONTINUED] irbesartan (AVAPRO) 75 MG tablet Take 1 tablet (75 mg total) by mouth daily.

## 2014-08-24 NOTE — Progress Notes (Signed)
PFT done today. 

## 2014-08-25 NOTE — Assessment & Plan Note (Signed)
-   07/23/2014  Walked RA  2 laps @ 185 ft each stopped due to  Fatigue, not sob with ok sats/ slow pace - d/c lisinopril 07/23/2014 > improved 08/24/14 - 08/24/14 pfts s airflow obst,  VC 2.41 (57%) with ERV 13 c/w body habitus  I had an extended discussion with the patient reviewing all relevant studies completed to date and  lasting 15 to 20 minutes of a 25 minute visit on the following ongoing concerns:   No evidence at all for copd finidings on pfts typical of obesity Needs reconditioning with some form of ex his back and legs can tolerate Pulmonary f/u is prn

## 2014-08-25 NOTE — Assessment & Plan Note (Signed)
Off acei 07/24/14 > improved sob/ no choking 08/24/14   Adequate control on present rx, reviewed > no change in rx needed  > would avoid acei indefinitely, listed as allergy

## 2014-09-07 DIAGNOSIS — I482 Chronic atrial fibrillation: Secondary | ICD-10-CM | POA: Diagnosis not present

## 2014-09-07 DIAGNOSIS — Z0000 Encounter for general adult medical examination without abnormal findings: Secondary | ICD-10-CM | POA: Diagnosis not present

## 2014-09-07 DIAGNOSIS — Z7901 Long term (current) use of anticoagulants: Secondary | ICD-10-CM | POA: Diagnosis not present

## 2014-09-07 DIAGNOSIS — Z125 Encounter for screening for malignant neoplasm of prostate: Secondary | ICD-10-CM | POA: Diagnosis not present

## 2014-10-05 DIAGNOSIS — Z7901 Long term (current) use of anticoagulants: Secondary | ICD-10-CM | POA: Diagnosis not present

## 2014-10-15 DIAGNOSIS — H6123 Impacted cerumen, bilateral: Secondary | ICD-10-CM | POA: Diagnosis not present

## 2014-10-15 DIAGNOSIS — H903 Sensorineural hearing loss, bilateral: Secondary | ICD-10-CM | POA: Diagnosis not present

## 2014-10-20 DIAGNOSIS — I129 Hypertensive chronic kidney disease with stage 1 through stage 4 chronic kidney disease, or unspecified chronic kidney disease: Secondary | ICD-10-CM | POA: Diagnosis not present

## 2014-10-20 DIAGNOSIS — E1122 Type 2 diabetes mellitus with diabetic chronic kidney disease: Secondary | ICD-10-CM | POA: Diagnosis not present

## 2014-10-20 DIAGNOSIS — N184 Chronic kidney disease, stage 4 (severe): Secondary | ICD-10-CM | POA: Diagnosis not present

## 2014-11-04 DIAGNOSIS — E1140 Type 2 diabetes mellitus with diabetic neuropathy, unspecified: Secondary | ICD-10-CM | POA: Diagnosis not present

## 2014-11-04 DIAGNOSIS — E039 Hypothyroidism, unspecified: Secondary | ICD-10-CM | POA: Diagnosis not present

## 2014-11-04 DIAGNOSIS — E1129 Type 2 diabetes mellitus with other diabetic kidney complication: Secondary | ICD-10-CM | POA: Diagnosis not present

## 2014-11-04 DIAGNOSIS — I10 Essential (primary) hypertension: Secondary | ICD-10-CM | POA: Diagnosis not present

## 2014-11-04 DIAGNOSIS — E1165 Type 2 diabetes mellitus with hyperglycemia: Secondary | ICD-10-CM | POA: Diagnosis not present

## 2014-11-04 DIAGNOSIS — E890 Postprocedural hypothyroidism: Secondary | ICD-10-CM | POA: Diagnosis not present

## 2015-01-12 ENCOUNTER — Ambulatory Visit (INDEPENDENT_AMBULATORY_CARE_PROVIDER_SITE_OTHER): Payer: Medicare Other | Admitting: Podiatry

## 2015-01-12 ENCOUNTER — Encounter: Payer: Self-pay | Admitting: Podiatry

## 2015-01-12 DIAGNOSIS — E119 Type 2 diabetes mellitus without complications: Secondary | ICD-10-CM

## 2015-01-12 DIAGNOSIS — B351 Tinea unguium: Secondary | ICD-10-CM | POA: Diagnosis not present

## 2015-01-12 NOTE — Patient Instructions (Signed)
Diabetes and Foot Care Diabetes may cause you to have problems because of poor blood supply (circulation) to your feet and legs. This may cause the skin on your feet to become thinner, break easier, and heal more slowly. Your skin may become dry, and the skin may peel and crack. You may also have nerve damage in your legs and feet causing decreased feeling in them. You may not notice minor injuries to your feet that could lead to infections or more serious problems. Taking care of your feet is one of the most important things you can do for yourself.  HOME CARE INSTRUCTIONS  Wear shoes at all times, even in the house. Do not go barefoot. Bare feet are easily injured.  Check your feet daily for blisters, cuts, and redness. If you cannot see the bottom of your feet, use a mirror or ask someone for help.  Wash your feet with warm water (do not use hot water) and mild soap. Then pat your feet and the areas between your toes until they are completely dry. Do not soak your feet as this can dry your skin.  Apply a moisturizing lotion or petroleum jelly (that does not contain alcohol and is unscented) to the skin on your feet and to dry, brittle toenails. Do not apply lotion between your toes.  Trim your toenails straight across. Do not dig under them or around the cuticle. File the edges of your nails with an emery board or nail file.  Do not cut corns or calluses or try to remove them with medicine.  Wear clean socks or stockings every day. Make sure they are not too tight. Do not wear knee-high stockings since they may decrease blood flow to your legs.  Wear shoes that fit properly and have enough cushioning. To break in new shoes, wear them for just a few hours a day. This prevents you from injuring your feet. Always look in your shoes before you put them on to be sure there are no objects inside.  Do not cross your legs. This may decrease the blood flow to your feet.  If you find a minor scrape,  cut, or break in the skin on your feet, keep it and the skin around it clean and dry. These areas may be cleansed with mild soap and water. Do not cleanse the area with peroxide, alcohol, or iodine.  When you remove an adhesive bandage, be sure not to damage the skin around it.  If you have a wound, look at it several times a day to make sure it is healing.  Do not use heating pads or hot water bottles. They may burn your skin. If you have lost feeling in your feet or legs, you may not know it is happening until it is too late.  Make sure your health care provider performs a complete foot exam at least annually or more often if you have foot problems. Report any cuts, sores, or bruises to your health care provider immediately. SEEK MEDICAL CARE IF:   You have an injury that is not healing.  You have cuts or breaks in the skin.  You have an ingrown nail.  You notice redness on your legs or feet.  You feel burning or tingling in your legs or feet.  You have pain or cramps in your legs and feet.  Your legs or feet are numb.  Your feet always feel cold. SEEK IMMEDIATE MEDICAL CARE IF:   There is increasing redness,   swelling, or pain in or around a wound.  There is a red line that goes up your leg.  Pus is coming from a wound.  You develop a fever or as directed by your health care provider.  You notice a bad smell coming from an ulcer or wound. Document Released: 08/31/2000 Document Revised: 05/06/2013 Document Reviewed: 02/10/2013 [employer] Patient Information 2015 [employer]. This information is not intended to replace advice given to you by your health care provider. Make sure you discuss any questions you have with your health care provider.  

## 2015-01-12 NOTE — Progress Notes (Signed)
   Subjective:    Patient ID: Daniel Ochoa, male    DOB: 01/04/1941, 74 y.o.   MRN: [id number]  HPI  N-THICK, DISCOLORATION L-B/L TOENAILS D-LONG-TERM O-SLOWLY C-WORSE-LONGER A-PRESSURE T-NONE  ALSO, B/L BALL OF THE FOOT HAVE CALLUS.  Patient has had podiatric care proximally 6 months prior while he was living in [place]  He denies any history of foot ulceration or claudication  Review of Systems  HENT: Positive for hearing loss.   Eyes: Positive for visual disturbance.  Cardiovascular: Positive for leg swelling.  Skin: Positive for color change.  All other systems reviewed and are negative.      Objective:   Physical Exam  Orientated 3 patient presents with his wife was in the treatment room  Vascular: DP and PT pulses 2/4 bilaterally Pitting edema ankles bilaterally  Neurological: Sensation to 10 g monofilament wire intact 5/5 right and 4/5 left Vibratory sensation nonreactive left weekly reactive right Ankle reflexes equal and reactive bilaterally  Dermatological: The toenails are elongated, brittle, discolored 6-10 Plantar keratoses third MPJ bilaterally  Musculoskeletal: Hammertoe deformities 2-5 bilaterally There is no restriction ankle, subtalar, midtarsal joints bilaterally:       Assessment & Plan:   Assessment: Satisfactory vascular status Mild neuropathy bilaterally Mycotic toenails 6-10 Plantar keratoses 2  Plan: I review the results of the patient's examination today  Nails 10 and keratoses 2 were debrided without any bleeding  Reappoint 3 months

## 2015-01-31 DIAGNOSIS — K219 Gastro-esophageal reflux disease without esophagitis: Secondary | ICD-10-CM | POA: Diagnosis not present

## 2015-01-31 DIAGNOSIS — F411 Generalized anxiety disorder: Secondary | ICD-10-CM | POA: Diagnosis not present

## 2015-01-31 DIAGNOSIS — I10 Essential (primary) hypertension: Secondary | ICD-10-CM | POA: Diagnosis not present

## 2015-01-31 DIAGNOSIS — N183 Chronic kidney disease, stage 3 (moderate): Secondary | ICD-10-CM | POA: Diagnosis not present

## 2015-01-31 DIAGNOSIS — R51 Headache: Secondary | ICD-10-CM | POA: Diagnosis not present

## 2015-02-15 DIAGNOSIS — E1122 Type 2 diabetes mellitus with diabetic chronic kidney disease: Secondary | ICD-10-CM | POA: Diagnosis not present

## 2015-02-15 DIAGNOSIS — I10 Essential (primary) hypertension: Secondary | ICD-10-CM | POA: Diagnosis not present

## 2015-02-15 DIAGNOSIS — Z79899 Other long term (current) drug therapy: Secondary | ICD-10-CM | POA: Diagnosis not present

## 2015-03-03 DIAGNOSIS — E1122 Type 2 diabetes mellitus with diabetic chronic kidney disease: Secondary | ICD-10-CM | POA: Diagnosis not present

## 2015-03-03 DIAGNOSIS — I129 Hypertensive chronic kidney disease with stage 1 through stage 4 chronic kidney disease, or unspecified chronic kidney disease: Secondary | ICD-10-CM | POA: Diagnosis not present

## 2015-03-03 DIAGNOSIS — N184 Chronic kidney disease, stage 4 (severe): Secondary | ICD-10-CM | POA: Diagnosis not present

## 2015-04-04 DIAGNOSIS — E1165 Type 2 diabetes mellitus with hyperglycemia: Secondary | ICD-10-CM | POA: Diagnosis not present

## 2015-04-04 DIAGNOSIS — E1140 Type 2 diabetes mellitus with diabetic neuropathy, unspecified: Secondary | ICD-10-CM | POA: Diagnosis not present

## 2015-04-04 DIAGNOSIS — E785 Hyperlipidemia, unspecified: Secondary | ICD-10-CM | POA: Diagnosis not present

## 2015-04-04 DIAGNOSIS — I10 Essential (primary) hypertension: Secondary | ICD-10-CM | POA: Diagnosis not present

## 2015-04-04 DIAGNOSIS — E890 Postprocedural hypothyroidism: Secondary | ICD-10-CM | POA: Diagnosis not present

## 2015-04-04 DIAGNOSIS — E1129 Type 2 diabetes mellitus with other diabetic kidney complication: Secondary | ICD-10-CM | POA: Diagnosis not present

## 2015-04-04 DIAGNOSIS — E1169 Type 2 diabetes mellitus with other specified complication: Secondary | ICD-10-CM | POA: Diagnosis not present

## 2015-04-18 ENCOUNTER — Ambulatory Visit: Payer: Medicare Other | Admitting: Podiatry

## 2015-04-20 ENCOUNTER — Encounter: Payer: Self-pay | Admitting: Podiatry

## 2015-04-20 ENCOUNTER — Ambulatory Visit (INDEPENDENT_AMBULATORY_CARE_PROVIDER_SITE_OTHER): Payer: Medicare Other | Admitting: Podiatry

## 2015-04-20 DIAGNOSIS — E119 Type 2 diabetes mellitus without complications: Secondary | ICD-10-CM | POA: Diagnosis not present

## 2015-04-20 DIAGNOSIS — B351 Tinea unguium: Secondary | ICD-10-CM

## 2015-04-20 DIAGNOSIS — M79673 Pain in unspecified foot: Secondary | ICD-10-CM | POA: Diagnosis not present

## 2015-04-20 NOTE — Patient Instructions (Signed)
Diabetes and Foot Care Diabetes may cause you to have problems because of poor blood supply (circulation) to your feet and legs. This may cause the skin on your feet to become thinner, break easier, and heal more slowly. Your skin may become dry, and the skin may peel and crack. You may also have nerve damage in your legs and feet causing decreased feeling in them. You may not notice minor injuries to your feet that could lead to infections or more serious problems. Taking care of your feet is one of the most important things you can do for yourself.  HOME CARE INSTRUCTIONS  Wear shoes at all times, even in the house. Do not go barefoot. Bare feet are easily injured.  Check your feet daily for blisters, cuts, and redness. If you cannot see the bottom of your feet, use a mirror or ask someone for help.  Wash your feet with warm water (do not use hot water) and mild soap. Then pat your feet and the areas between your toes until they are completely dry. Do not soak your feet as this can dry your skin.  Apply a moisturizing lotion or petroleum jelly (that does not contain alcohol and is unscented) to the skin on your feet and to dry, brittle toenails. Do not apply lotion between your toes.  Trim your toenails straight across. Do not dig under them or around the cuticle. File the edges of your nails with an emery board or nail file.  Do not cut corns or calluses or try to remove them with medicine.  Wear clean socks or stockings every day. Make sure they are not too tight. Do not wear knee-high stockings since they may decrease blood flow to your legs.  Wear shoes that fit properly and have enough cushioning. To break in new shoes, wear them for just a few hours a day. This prevents you from injuring your feet. Always look in your shoes before you put them on to be sure there are no objects inside.  Do not cross your legs. This may decrease the blood flow to your feet.  If you find a minor scrape,  cut, or break in the skin on your feet, keep it and the skin around it clean and dry. These areas may be cleansed with mild soap and water. Do not cleanse the area with peroxide, alcohol, or iodine.  When you remove an adhesive bandage, be sure not to damage the skin around it.  If you have a wound, look at it several times a day to make sure it is healing.  Do not use heating pads or hot water bottles. They may burn your skin. If you have lost feeling in your feet or legs, you may not know it is happening until it is too late.  Make sure your health care provider performs a complete foot exam at least annually or more often if you have foot problems. Report any cuts, sores, or bruises to your health care provider immediately. SEEK MEDICAL CARE IF:   You have an injury that is not healing.  You have cuts or breaks in the skin.  You have an ingrown nail.  You notice redness on your legs or feet.  You feel burning or tingling in your legs or feet.  You have pain or cramps in your legs and feet.  Your legs or feet are numb.  Your feet always feel cold. SEEK IMMEDIATE MEDICAL CARE IF:   There is increasing redness,   swelling, or pain in or around a wound.  There is a red line that goes up your leg.  Pus is coming from a wound.  You develop a fever or as directed by your health care provider.  You notice a bad smell coming from an ulcer or wound. Document Released: 08/31/2000 Document Revised: 05/06/2013 Document Reviewed: 02/10/2013 [employer] Patient Information 2015 [employer]. This information is not intended to replace advice given to you by your health care provider. Make sure you discuss any questions you have with your health care provider.  

## 2015-04-21 NOTE — Progress Notes (Signed)
Patient ID: Daniel Ochoa, male   DOB: October 04, 1940, 74 y.o.   MRN: [id number]  Subjective: This patient presents for a scheduled visit complaining of painful toenails and requesting toenail debridement  Objective: The toenails are brittle, discolored, elongated, hypertrophic and tender to direct palpation 6-10  Assessment: Symptomatic onychomycoses 6-10 Diabetic satisfactory vascular status Diabetic without complications  Plan: Debridement of toenails mechanically electrically 10 without any bleeding  Reappoint 3 months

## 2015-06-30 DIAGNOSIS — E039 Hypothyroidism, unspecified: Secondary | ICD-10-CM | POA: Diagnosis not present

## 2015-06-30 DIAGNOSIS — Z23 Encounter for immunization: Secondary | ICD-10-CM | POA: Diagnosis not present

## 2015-06-30 DIAGNOSIS — G8929 Other chronic pain: Secondary | ICD-10-CM | POA: Diagnosis not present

## 2015-06-30 DIAGNOSIS — M159 Polyosteoarthritis, unspecified: Secondary | ICD-10-CM | POA: Diagnosis not present

## 2015-06-30 DIAGNOSIS — E1122 Type 2 diabetes mellitus with diabetic chronic kidney disease: Secondary | ICD-10-CM | POA: Diagnosis not present

## 2015-06-30 DIAGNOSIS — N184 Chronic kidney disease, stage 4 (severe): Secondary | ICD-10-CM | POA: Diagnosis not present

## 2015-06-30 DIAGNOSIS — G4733 Obstructive sleep apnea (adult) (pediatric): Secondary | ICD-10-CM | POA: Diagnosis not present

## 2015-06-30 DIAGNOSIS — I10 Essential (primary) hypertension: Secondary | ICD-10-CM | POA: Diagnosis not present

## 2015-07-05 DIAGNOSIS — E1122 Type 2 diabetes mellitus with diabetic chronic kidney disease: Secondary | ICD-10-CM | POA: Diagnosis not present

## 2015-07-05 DIAGNOSIS — I10 Essential (primary) hypertension: Secondary | ICD-10-CM | POA: Diagnosis not present

## 2015-07-05 DIAGNOSIS — E890 Postprocedural hypothyroidism: Secondary | ICD-10-CM | POA: Diagnosis not present

## 2015-07-05 DIAGNOSIS — E1169 Type 2 diabetes mellitus with other specified complication: Secondary | ICD-10-CM | POA: Diagnosis not present

## 2015-07-05 DIAGNOSIS — E1165 Type 2 diabetes mellitus with hyperglycemia: Secondary | ICD-10-CM | POA: Diagnosis not present

## 2015-07-05 DIAGNOSIS — N184 Chronic kidney disease, stage 4 (severe): Secondary | ICD-10-CM | POA: Diagnosis not present

## 2015-07-05 DIAGNOSIS — E785 Hyperlipidemia, unspecified: Secondary | ICD-10-CM | POA: Diagnosis not present

## 2015-07-05 DIAGNOSIS — E1140 Type 2 diabetes mellitus with diabetic neuropathy, unspecified: Secondary | ICD-10-CM | POA: Diagnosis not present

## 2015-07-05 DIAGNOSIS — Z794 Long term (current) use of insulin: Secondary | ICD-10-CM | POA: Diagnosis not present

## 2015-07-15 DIAGNOSIS — N184 Chronic kidney disease, stage 4 (severe): Secondary | ICD-10-CM | POA: Diagnosis not present

## 2015-07-15 DIAGNOSIS — E1122 Type 2 diabetes mellitus with diabetic chronic kidney disease: Secondary | ICD-10-CM | POA: Diagnosis not present

## 2015-07-15 DIAGNOSIS — I129 Hypertensive chronic kidney disease with stage 1 through stage 4 chronic kidney disease, or unspecified chronic kidney disease: Secondary | ICD-10-CM | POA: Diagnosis not present

## 2015-07-27 ENCOUNTER — Ambulatory Visit (INDEPENDENT_AMBULATORY_CARE_PROVIDER_SITE_OTHER): Payer: Medicare Other | Admitting: Podiatry

## 2015-07-27 ENCOUNTER — Encounter: Payer: Self-pay | Admitting: Podiatry

## 2015-07-27 DIAGNOSIS — B351 Tinea unguium: Secondary | ICD-10-CM

## 2015-07-27 DIAGNOSIS — E119 Type 2 diabetes mellitus without complications: Secondary | ICD-10-CM | POA: Diagnosis not present

## 2015-07-27 DIAGNOSIS — M79676 Pain in unspecified toe(s): Secondary | ICD-10-CM | POA: Diagnosis not present

## 2015-07-27 NOTE — Progress Notes (Signed)
Patient ID: Daniel Ochoa, male   DOB: April 04, 1941, 74 y.o.   MRN: [id number] Complaint:  Visit Type: Patient returns to my office for continued preventative foot care services. Complaint: Patient states" my nails have grown long and thick and become painful to walk and wear shoes" Patient has been diagnosed with DM with no foot complications. The patient presents for preventative foot care services. No changes to ROS  Podiatric Exam: Vascular: dorsalis pedis and posterior tibial pulses are not  palpable bilateral due to swelling both feet. Capillary return is immediate. Temperature gradient is diminished. Skin turgor WNL  Sensorium: Normal Semmes Weinstein monofilament test. Normal tactile sensation bilaterally. Nail Exam: Pt has thick disfigured discolored nails with subungual debris noted bilateral entire nail hallux through fifth toenails Ulcer Exam: There is no evidence of ulcer or pre-ulcerative changes or infection. Orthopedic Exam: Muscle tone and strength are WNL. No limitations in general ROM. No crepitus or effusions noted. Foot type and digits show no abnormalities. Bony prominences are unremarkable. Skin: No Porokeratosis. No infection or ulcers  Diagnosis:  Onychomycosis, , Pain in right toe, pain in left toes  Treatment & Plan Procedures and Treatment: Consent by patient was obtained for treatment procedures. The patient understood the discussion of treatment and procedures well. All questions were answered thoroughly reviewed. Debridement of mycotic and hypertrophic toenails, 1 through 5 bilateral and clearing of subungual debris. No ulceration, no infection noted.  Return Visit-Office Procedure: Patient instructed to return to the office for a follow up visit 3 months for continued evaluation and treatment.

## 2015-08-09 DIAGNOSIS — E119 Type 2 diabetes mellitus without complications: Secondary | ICD-10-CM | POA: Diagnosis not present

## 2015-08-09 DIAGNOSIS — I10 Essential (primary) hypertension: Secondary | ICD-10-CM | POA: Diagnosis not present

## 2015-08-09 DIAGNOSIS — H2513 Age-related nuclear cataract, bilateral: Secondary | ICD-10-CM | POA: Diagnosis not present

## 2015-08-09 DIAGNOSIS — N184 Chronic kidney disease, stage 4 (severe): Secondary | ICD-10-CM | POA: Diagnosis not present

## 2015-08-09 DIAGNOSIS — E890 Postprocedural hypothyroidism: Secondary | ICD-10-CM | POA: Diagnosis not present

## 2015-08-09 DIAGNOSIS — E1165 Type 2 diabetes mellitus with hyperglycemia: Secondary | ICD-10-CM | POA: Diagnosis not present

## 2015-10-18 DIAGNOSIS — H6123 Impacted cerumen, bilateral: Secondary | ICD-10-CM | POA: Diagnosis not present

## 2015-10-18 DIAGNOSIS — H903 Sensorineural hearing loss, bilateral: Secondary | ICD-10-CM | POA: Diagnosis not present

## 2015-10-26 ENCOUNTER — Encounter: Payer: Self-pay | Admitting: Podiatry

## 2015-10-26 ENCOUNTER — Ambulatory Visit (INDEPENDENT_AMBULATORY_CARE_PROVIDER_SITE_OTHER): Payer: Medicare Other | Admitting: Podiatry

## 2015-10-26 DIAGNOSIS — B351 Tinea unguium: Secondary | ICD-10-CM | POA: Diagnosis not present

## 2015-10-26 DIAGNOSIS — E1142 Type 2 diabetes mellitus with diabetic polyneuropathy: Secondary | ICD-10-CM

## 2015-10-26 DIAGNOSIS — M79676 Pain in unspecified toe(s): Secondary | ICD-10-CM

## 2015-10-26 HISTORY — DX: Type 2 diabetes mellitus with diabetic polyneuropathy: E11.42

## 2015-10-26 NOTE — Patient Instructions (Signed)
Diabetes and Foot Care Diabetes may cause you to have problems because of poor blood supply (circulation) to your feet and legs. This may cause the skin on your feet to become thinner, break easier, and heal more slowly. Your skin may become dry, and the skin may peel and crack. You may also have nerve damage in your legs and feet causing decreased feeling in them. You may not notice minor injuries to your feet that could lead to infections or more serious problems. Taking care of your feet is one of the most important things you can do for yourself.  HOME CARE INSTRUCTIONS  Wear shoes at all times, even in the house. Do not go barefoot. Bare feet are easily injured.  Check your feet daily for blisters, cuts, and redness. If you cannot see the bottom of your feet, use a mirror or ask someone for help.  Wash your feet with warm water (do not use hot water) and mild soap. Then pat your feet and the areas between your toes until they are completely dry. Do not soak your feet as this can dry your skin.  Apply a moisturizing lotion or petroleum jelly (that does not contain alcohol and is unscented) to the skin on your feet and to dry, brittle toenails. Do not apply lotion between your toes.  Trim your toenails straight across. Do not dig under them or around the cuticle. File the edges of your nails with an emery board or nail file.  Do not cut corns or calluses or try to remove them with medicine.  Wear clean socks or stockings every day. Make sure they are not too tight. Do not wear knee-high stockings since they may decrease blood flow to your legs.  Wear shoes that fit properly and have enough cushioning. To break in new shoes, wear them for just a few hours a day. This prevents you from injuring your feet. Always look in your shoes before you put them on to be sure there are no objects inside.  Do not cross your legs. This may decrease the blood flow to your feet.  If you find a minor scrape,  cut, or break in the skin on your feet, keep it and the skin around it clean and dry. These areas may be cleansed with mild soap and water. Do not cleanse the area with peroxide, alcohol, or iodine.  When you remove an adhesive bandage, be sure not to damage the skin around it.  If you have a wound, look at it several times a day to make sure it is healing.  Do not use heating pads or hot water bottles. They may burn your skin. If you have lost feeling in your feet or legs, you may not know it is happening until it is too late.  Make sure your health care provider performs a complete foot exam at least annually or more often if you have foot problems. Report any cuts, sores, or bruises to your health care provider immediately. SEEK MEDICAL CARE IF:   You have an injury that is not healing.  You have cuts or breaks in the skin.  You have an ingrown nail.  You notice redness on your legs or feet.  You feel burning or tingling in your legs or feet.  You have pain or cramps in your legs and feet.  Your legs or feet are numb.  Your feet always feel cold. SEEK IMMEDIATE MEDICAL CARE IF:   There is increasing redness,   swelling, or pain in or around a wound.  There is a red line that goes up your leg.  Pus is coming from a wound.  You develop a fever or as directed by your health care provider.  You notice a bad smell coming from an ulcer or wound.   This information is not intended to replace advice given to you by your health care provider. Make sure you discuss any questions you have with your health care provider.   Document Released: 08/31/2000 Document Revised: 05/06/2013 Document Reviewed: 02/10/2013 Elsevier Interactive Patient Education 2016 [employer]  

## 2015-10-27 NOTE — Progress Notes (Signed)
Patient ID: Daniel Ochoa, male   DOB: [birth year], 75 y.o.   MRN: [id number]  Subjective: This patient presents today again complaining of thickened and elongated toenails and requests toenail debridement.  Patient has a history of diabetes with neuropathy   Patient appears orientated 3 with wife present to treatment room Vascular: Peripheral pitting edema bilaterally DP and PT pulses 2/4 bilaterally Capillary reflex immediate bilaterally  Neurological: Sensation to monofilament wire intact 2/5 right and 3/5 left Vibratory sensation nonreactive bilaterally Ankle reflex reactive bilaterally  Dermatological: No open skin lesions bilaterally Minimal plantar callus sub-third MPJ bilaterally The toenails are elongated, brittle, deformed, discolored and tender to direct palpation 6-10  Musculoskeletal: HAV deformities bilaterally Hammertoe 2-5 bilaterally  Assessment: Peripheral edema bilaterally Vascular status appears adequate Diabetic peripheral neuropathy Mycotic toenails 6-10 Plantar calluses 2  Plan: Debridement toenails 6-10 mechanically and left without any bleeding No debridement needed for plantar calluses 2  Reappoint 3 months

## 2015-11-04 DIAGNOSIS — N184 Chronic kidney disease, stage 4 (severe): Secondary | ICD-10-CM | POA: Diagnosis not present

## 2015-11-04 DIAGNOSIS — E1122 Type 2 diabetes mellitus with diabetic chronic kidney disease: Secondary | ICD-10-CM | POA: Diagnosis not present

## 2015-11-04 DIAGNOSIS — I129 Hypertensive chronic kidney disease with stage 1 through stage 4 chronic kidney disease, or unspecified chronic kidney disease: Secondary | ICD-10-CM | POA: Diagnosis not present

## 2015-11-15 DIAGNOSIS — N184 Chronic kidney disease, stage 4 (severe): Secondary | ICD-10-CM | POA: Diagnosis not present

## 2015-11-15 DIAGNOSIS — I10 Essential (primary) hypertension: Secondary | ICD-10-CM | POA: Diagnosis not present

## 2015-11-15 DIAGNOSIS — E890 Postprocedural hypothyroidism: Secondary | ICD-10-CM | POA: Diagnosis not present

## 2015-11-15 DIAGNOSIS — E1165 Type 2 diabetes mellitus with hyperglycemia: Secondary | ICD-10-CM | POA: Diagnosis not present

## 2015-12-06 DIAGNOSIS — E1122 Type 2 diabetes mellitus with diabetic chronic kidney disease: Secondary | ICD-10-CM | POA: Diagnosis not present

## 2015-12-06 DIAGNOSIS — N184 Chronic kidney disease, stage 4 (severe): Secondary | ICD-10-CM | POA: Diagnosis not present

## 2015-12-06 DIAGNOSIS — Z6841 Body Mass Index (BMI) 40.0 and over, adult: Secondary | ICD-10-CM | POA: Diagnosis not present

## 2015-12-06 DIAGNOSIS — G4733 Obstructive sleep apnea (adult) (pediatric): Secondary | ICD-10-CM | POA: Diagnosis not present

## 2015-12-06 DIAGNOSIS — Z79899 Other long term (current) drug therapy: Secondary | ICD-10-CM | POA: Diagnosis not present

## 2015-12-06 DIAGNOSIS — M159 Polyosteoarthritis, unspecified: Secondary | ICD-10-CM | POA: Diagnosis not present

## 2015-12-06 DIAGNOSIS — I10 Essential (primary) hypertension: Secondary | ICD-10-CM | POA: Diagnosis not present

## 2015-12-06 DIAGNOSIS — E039 Hypothyroidism, unspecified: Secondary | ICD-10-CM | POA: Diagnosis not present

## 2015-12-06 DIAGNOSIS — G8929 Other chronic pain: Secondary | ICD-10-CM | POA: Diagnosis not present

## 2015-12-06 DIAGNOSIS — Z794 Long term (current) use of insulin: Secondary | ICD-10-CM | POA: Diagnosis not present

## 2016-02-01 ENCOUNTER — Encounter: Payer: Self-pay | Admitting: Podiatry

## 2016-02-01 ENCOUNTER — Ambulatory Visit (INDEPENDENT_AMBULATORY_CARE_PROVIDER_SITE_OTHER): Payer: Medicare Other | Admitting: Podiatry

## 2016-02-01 DIAGNOSIS — E1142 Type 2 diabetes mellitus with diabetic polyneuropathy: Secondary | ICD-10-CM

## 2016-02-01 DIAGNOSIS — M79676 Pain in unspecified toe(s): Secondary | ICD-10-CM | POA: Diagnosis not present

## 2016-02-01 DIAGNOSIS — B351 Tinea unguium: Secondary | ICD-10-CM

## 2016-02-01 NOTE — Patient Instructions (Signed)
Diabetes and Foot Care Diabetes may cause you to have problems because of poor blood supply (circulation) to your feet and legs. This may cause the skin on your feet to become thinner, break easier, and heal more slowly. Your skin may become dry, and the skin may peel and crack. You may also have nerve damage in your legs and feet causing decreased feeling in them. You may not notice minor injuries to your feet that could lead to infections or more serious problems. Taking care of your feet is one of the most important things you can do for yourself.  HOME CARE INSTRUCTIONS  Wear shoes at all times, even in the house. Do not go barefoot. Bare feet are easily injured.  Check your feet daily for blisters, cuts, and redness. If you cannot see the bottom of your feet, use a mirror or ask someone for help.  Wash your feet with warm water (do not use hot water) and mild soap. Then pat your feet and the areas between your toes until they are completely dry. Do not soak your feet as this can dry your skin.  Apply a moisturizing lotion or petroleum jelly (that does not contain alcohol and is unscented) to the skin on your feet and to dry, brittle toenails. Do not apply lotion between your toes.  Trim your toenails straight across. Do not dig under them or around the cuticle. File the edges of your nails with an emery board or nail file.  Do not cut corns or calluses or try to remove them with medicine.  Wear clean socks or stockings every day. Make sure they are not too tight. Do not wear knee-high stockings since they may decrease blood flow to your legs.  Wear shoes that fit properly and have enough cushioning. To break in new shoes, wear them for just a few hours a day. This prevents you from injuring your feet. Always look in your shoes before you put them on to be sure there are no objects inside.  Do not cross your legs. This may decrease the blood flow to your feet.  If you find a minor scrape,  cut, or break in the skin on your feet, keep it and the skin around it clean and dry. These areas may be cleansed with mild soap and water. Do not cleanse the area with peroxide, alcohol, or iodine.  When you remove an adhesive bandage, be sure not to damage the skin around it.  If you have a wound, look at it several times a day to make sure it is healing.  Do not use heating pads or hot water bottles. They may burn your skin. If you have lost feeling in your feet or legs, you may not know it is happening until it is too late.  Make sure your health care provider performs a complete foot exam at least annually or more often if you have foot problems. Report any cuts, sores, or bruises to your health care provider immediately. SEEK MEDICAL CARE IF:   You have an injury that is not healing.  You have cuts or breaks in the skin.  You have an ingrown nail.  You notice redness on your legs or feet.  You feel burning or tingling in your legs or feet.  You have pain or cramps in your legs and feet.  Your legs or feet are numb.  Your feet always feel cold. SEEK IMMEDIATE MEDICAL CARE IF:   There is increasing redness,   swelling, or pain in or around a wound.  There is a red line that goes up your leg.  Pus is coming from a wound.  You develop a fever or as directed by your health care provider.  You notice a bad smell coming from an ulcer or wound.   This information is not intended to replace advice given to you by your health care provider. Make sure you discuss any questions you have with your health care provider.   Document Released: 08/31/2000 Document Revised: 05/06/2013 Document Reviewed: 02/10/2013 Elsevier Interactive Patient Education 2016 [employer]  

## 2016-02-02 NOTE — Progress Notes (Signed)
Patient ID: Daniel Ochoa, male   DOB: 1941-02-17, 75 y.o.   MRN: [id number]  Subjective: This patient presents today again complaining of thickened and elongated toenails and requests toenail debridement. The patient's wife is present in the treatment room today Patient has a history of diabetes with neuropathy   Patient appears orientated 3 with wife present to treatment room Vascular: Peripheral pitting edema bilaterally DP and PT pulses 2/4 bilaterally Capillary reflex immediate bilaterally  Neurological: Sensation to monofilament wire intact 2/5 right and 3/5 left Vibratory sensation nonreactive bilaterally Ankle reflex reactive bilaterally  Dermatological: No open skin lesions bilaterally Minimal plantar callus  bilaterally The toenails are elongated, brittle, deformed, discolored and tender to direct palpation 6-10  Musculoskeletal: HAV deformities bilaterally Hammertoe 2-5 bilaterally  Assessment: Peripheral edema bilaterally Vascular status appears adequate Diabetic peripheral neuropathy Mycotic toenails 6-10 Plantar calluses 2  Plan: Debridement toenails 6-10 mechanically and left without any bleeding No debridement needed for plantar calluses 2

## 2016-02-15 DIAGNOSIS — E1165 Type 2 diabetes mellitus with hyperglycemia: Secondary | ICD-10-CM | POA: Diagnosis not present

## 2016-02-15 DIAGNOSIS — E890 Postprocedural hypothyroidism: Secondary | ICD-10-CM | POA: Diagnosis not present

## 2016-02-15 DIAGNOSIS — I10 Essential (primary) hypertension: Secondary | ICD-10-CM | POA: Diagnosis not present

## 2016-02-15 DIAGNOSIS — N184 Chronic kidney disease, stage 4 (severe): Secondary | ICD-10-CM | POA: Diagnosis not present

## 2016-02-22 DIAGNOSIS — E1165 Type 2 diabetes mellitus with hyperglycemia: Secondary | ICD-10-CM | POA: Diagnosis not present

## 2016-03-08 DIAGNOSIS — N184 Chronic kidney disease, stage 4 (severe): Secondary | ICD-10-CM | POA: Diagnosis not present

## 2016-03-08 DIAGNOSIS — E1122 Type 2 diabetes mellitus with diabetic chronic kidney disease: Secondary | ICD-10-CM | POA: Diagnosis not present

## 2016-03-08 DIAGNOSIS — I129 Hypertensive chronic kidney disease with stage 1 through stage 4 chronic kidney disease, or unspecified chronic kidney disease: Secondary | ICD-10-CM | POA: Diagnosis not present

## 2016-04-09 DIAGNOSIS — N184 Chronic kidney disease, stage 4 (severe): Secondary | ICD-10-CM | POA: Diagnosis not present

## 2016-05-09 ENCOUNTER — Ambulatory Visit (INDEPENDENT_AMBULATORY_CARE_PROVIDER_SITE_OTHER): Payer: Medicare Other | Admitting: Podiatry

## 2016-05-09 ENCOUNTER — Encounter: Payer: Self-pay | Admitting: Podiatry

## 2016-05-09 DIAGNOSIS — E1142 Type 2 diabetes mellitus with diabetic polyneuropathy: Secondary | ICD-10-CM | POA: Diagnosis not present

## 2016-05-09 DIAGNOSIS — M79676 Pain in unspecified toe(s): Secondary | ICD-10-CM | POA: Diagnosis not present

## 2016-05-09 DIAGNOSIS — B351 Tinea unguium: Secondary | ICD-10-CM

## 2016-05-09 NOTE — Patient Instructions (Signed)
Diabetes and Foot Care Diabetes may cause you to have problems because of poor blood supply (circulation) to your feet and legs. This may cause the skin on your feet to become thinner, break easier, and heal more slowly. Your skin may become dry, and the skin may peel and crack. You may also have nerve damage in your legs and feet causing decreased feeling in them. You may not notice minor injuries to your feet that could lead to infections or more serious problems. Taking care of your feet is one of the most important things you can do for yourself.  HOME CARE INSTRUCTIONS  Wear shoes at all times, even in the house. Do not go barefoot. Bare feet are easily injured.  Check your feet daily for blisters, cuts, and redness. If you cannot see the bottom of your feet, use a mirror or ask someone for help.  Wash your feet with warm water (do not use hot water) and mild soap. Then pat your feet and the areas between your toes until they are completely dry. Do not soak your feet as this can dry your skin.  Apply a moisturizing lotion or petroleum jelly (that does not contain alcohol and is unscented) to the skin on your feet and to dry, brittle toenails. Do not apply lotion between your toes.  Trim your toenails straight across. Do not dig under them or around the cuticle. File the edges of your nails with an emery board or nail file.  Do not cut corns or calluses or try to remove them with medicine.  Wear clean socks or stockings every day. Make sure they are not too tight. Do not wear knee-high stockings since they may decrease blood flow to your legs.  Wear shoes that fit properly and have enough cushioning. To break in new shoes, wear them for just a few hours a day. This prevents you from injuring your feet. Always look in your shoes before you put them on to be sure there are no objects inside.  Do not cross your legs. This may decrease the blood flow to your feet.  If you find a minor scrape,  cut, or break in the skin on your feet, keep it and the skin around it clean and dry. These areas may be cleansed with mild soap and water. Do not cleanse the area with peroxide, alcohol, or iodine.  When you remove an adhesive bandage, be sure not to damage the skin around it.  If you have a wound, look at it several times a day to make sure it is healing.  Do not use heating pads or hot water bottles. They may burn your skin. If you have lost feeling in your feet or legs, you may not know it is happening until it is too late.  Make sure your health care provider performs a complete foot exam at least annually or more often if you have foot problems. Report any cuts, sores, or bruises to your health care provider immediately. SEEK MEDICAL CARE IF:   You have an injury that is not healing.  You have cuts or breaks in the skin.  You have an ingrown nail.  You notice redness on your legs or feet.  You feel burning or tingling in your legs or feet.  You have pain or cramps in your legs and feet.  Your legs or feet are numb.  Your feet always feel cold. SEEK IMMEDIATE MEDICAL CARE IF:   There is increasing redness,   swelling, or pain in or around a wound.  There is a red line that goes up your leg.  Pus is coming from a wound.  You develop a fever or as directed by your health care provider.  You notice a bad smell coming from an ulcer or wound.   This information is not intended to replace advice given to you by your health care provider. Make sure you discuss any questions you have with your health care provider.   Document Released: 08/31/2000 Document Revised: 05/06/2013 Document Reviewed: 02/10/2013 Elsevier Interactive Patient Education 2016 [employer]  

## 2016-05-09 NOTE — Progress Notes (Signed)
Patient ID: Daniel Ochoa, male   DOB: [birth year], 75 y.o.   MRN: [id number]    Subjective: This patient presents today again complaining of thickened and elongated toenails and requests toenail debridement. The patient's wife is present in the treatment room today Patient has a history of diabetes with neuropathy   Patient appears orientated 3 with wife present to treatment room Vascular: Peripheral pitting edema bilaterally DP and PT pulses 2/4 bilaterally Capillary reflex immediate bilaterally  Neurological: Sensation to monofilament wire intact 2/5 right and 3/5 left Vibratory sensation nonreactive bilaterally Ankle reflex reactive bilaterally  Dermatological: No open skin lesions bilaterally Minimal plantar callus  bilaterally The toenails are elongated, brittle, deformed, discolored and tender to direct palpation 6-10  Musculoskeletal: HAV deformities bilaterally Hammertoe 2-5 bilaterally  Assessment: Peripheral edema bilaterally Vascular status appears adequate Diabetic peripheral neuropathy Mycotic toenails 6-10 Plantar calluses 2  Plan: Debridement toenails 6-10 mechanically and left without any bleeding No debridement needed for plantar calluses 2

## 2016-05-10 DIAGNOSIS — N184 Chronic kidney disease, stage 4 (severe): Secondary | ICD-10-CM | POA: Diagnosis not present

## 2016-05-16 DIAGNOSIS — E1165 Type 2 diabetes mellitus with hyperglycemia: Secondary | ICD-10-CM | POA: Diagnosis not present

## 2016-05-16 DIAGNOSIS — Z6841 Body Mass Index (BMI) 40.0 and over, adult: Secondary | ICD-10-CM | POA: Diagnosis not present

## 2016-05-16 DIAGNOSIS — Z9641 Presence of insulin pump (external) (internal): Secondary | ICD-10-CM | POA: Diagnosis not present

## 2016-05-16 DIAGNOSIS — I129 Hypertensive chronic kidney disease with stage 1 through stage 4 chronic kidney disease, or unspecified chronic kidney disease: Secondary | ICD-10-CM | POA: Diagnosis not present

## 2016-05-16 DIAGNOSIS — I10 Essential (primary) hypertension: Secondary | ICD-10-CM | POA: Diagnosis not present

## 2016-05-16 DIAGNOSIS — E1122 Type 2 diabetes mellitus with diabetic chronic kidney disease: Secondary | ICD-10-CM | POA: Diagnosis not present

## 2016-05-16 DIAGNOSIS — E890 Postprocedural hypothyroidism: Secondary | ICD-10-CM | POA: Diagnosis not present

## 2016-05-16 DIAGNOSIS — N184 Chronic kidney disease, stage 4 (severe): Secondary | ICD-10-CM | POA: Diagnosis not present

## 2016-06-04 DIAGNOSIS — D509 Iron deficiency anemia, unspecified: Secondary | ICD-10-CM | POA: Diagnosis not present

## 2016-06-04 DIAGNOSIS — N184 Chronic kidney disease, stage 4 (severe): Secondary | ICD-10-CM | POA: Diagnosis not present

## 2016-06-11 DIAGNOSIS — E1122 Type 2 diabetes mellitus with diabetic chronic kidney disease: Secondary | ICD-10-CM | POA: Diagnosis not present

## 2016-06-11 DIAGNOSIS — N184 Chronic kidney disease, stage 4 (severe): Secondary | ICD-10-CM | POA: Diagnosis not present

## 2016-06-11 DIAGNOSIS — Z6841 Body Mass Index (BMI) 40.0 and over, adult: Secondary | ICD-10-CM | POA: Diagnosis not present

## 2016-06-11 DIAGNOSIS — I129 Hypertensive chronic kidney disease with stage 1 through stage 4 chronic kidney disease, or unspecified chronic kidney disease: Secondary | ICD-10-CM | POA: Diagnosis not present

## 2016-07-30 DIAGNOSIS — G4733 Obstructive sleep apnea (adult) (pediatric): Secondary | ICD-10-CM | POA: Diagnosis not present

## 2016-07-30 DIAGNOSIS — I10 Essential (primary) hypertension: Secondary | ICD-10-CM | POA: Diagnosis not present

## 2016-07-30 DIAGNOSIS — E1122 Type 2 diabetes mellitus with diabetic chronic kidney disease: Secondary | ICD-10-CM | POA: Diagnosis not present

## 2016-07-30 DIAGNOSIS — E784 Other hyperlipidemia: Secondary | ICD-10-CM | POA: Diagnosis not present

## 2016-07-30 DIAGNOSIS — N184 Chronic kidney disease, stage 4 (severe): Secondary | ICD-10-CM | POA: Diagnosis not present

## 2016-07-30 DIAGNOSIS — E039 Hypothyroidism, unspecified: Secondary | ICD-10-CM | POA: Diagnosis not present

## 2016-07-30 DIAGNOSIS — I270 Primary pulmonary hypertension: Secondary | ICD-10-CM | POA: Diagnosis not present

## 2016-07-30 DIAGNOSIS — Z23 Encounter for immunization: Secondary | ICD-10-CM | POA: Diagnosis not present

## 2016-07-30 DIAGNOSIS — F411 Generalized anxiety disorder: Secondary | ICD-10-CM | POA: Diagnosis not present

## 2016-07-30 DIAGNOSIS — G8929 Other chronic pain: Secondary | ICD-10-CM | POA: Diagnosis not present

## 2016-08-08 ENCOUNTER — Ambulatory Visit (INDEPENDENT_AMBULATORY_CARE_PROVIDER_SITE_OTHER): Payer: Medicare Other | Admitting: Podiatry

## 2016-08-08 ENCOUNTER — Encounter: Payer: Self-pay | Admitting: Podiatry

## 2016-08-08 VITALS — BP 138/65 | HR 67 | Resp 18

## 2016-08-08 DIAGNOSIS — B351 Tinea unguium: Secondary | ICD-10-CM | POA: Diagnosis not present

## 2016-08-08 DIAGNOSIS — E1142 Type 2 diabetes mellitus with diabetic polyneuropathy: Secondary | ICD-10-CM | POA: Diagnosis not present

## 2016-08-08 DIAGNOSIS — M79676 Pain in unspecified toe(s): Secondary | ICD-10-CM | POA: Diagnosis not present

## 2016-08-08 NOTE — Patient Instructions (Signed)

## 2016-08-08 NOTE — Progress Notes (Signed)
Patient ID: Daniel Ochoa, male   DOB: 1940/11/03, 75 y.o.   MRN: [id number]    Subjective: This patient presents today again complaining of thickened and elongated toenails and requests toenail debridement. The patient's wife is present in the treatment room today Patient has a history of diabetes with neuropathy Patient is also requesting diabetic shoes  Patient appears orientated 3 with wife present to treatment room Vascular: Peripheral pitting edema bilaterally DP and PT pulses 2/4 bilaterally Capillary reflex immediate bilaterally  Neurological: Sensation to monofilament wire intact 2/5 right and 3/5 left Vibratory sensation nonreactive bilaterally Ankle reflex reactive bilaterally  Dermatological: No open skin lesions bilaterally Minimal plantar callus bilaterally The toenails are elongated, brittle, deformed, discolored and tender to direct palpation 6-10  Musculoskeletal: HAV deformities bilaterally Hammertoe 2-5 bilaterally  Assessment: Peripheral edema bilaterally Vascular status appears adequate Diabetic peripheral neuropathy Mycotic toenails 6-10 Plantar calluses 2  Plan: Debridement toenails 6-10 mechanically and left without any bleeding No debridement needed for plantar calluses 2 Obtain certification for diabetic shoes   Reappoint 3 months for skin a nail debridement

## 2016-08-13 DIAGNOSIS — E119 Type 2 diabetes mellitus without complications: Secondary | ICD-10-CM | POA: Diagnosis not present

## 2016-08-13 DIAGNOSIS — H524 Presbyopia: Secondary | ICD-10-CM | POA: Diagnosis not present

## 2016-08-16 DIAGNOSIS — E1165 Type 2 diabetes mellitus with hyperglycemia: Secondary | ICD-10-CM | POA: Diagnosis not present

## 2016-08-16 DIAGNOSIS — E890 Postprocedural hypothyroidism: Secondary | ICD-10-CM | POA: Diagnosis not present

## 2016-08-16 DIAGNOSIS — N184 Chronic kidney disease, stage 4 (severe): Secondary | ICD-10-CM | POA: Diagnosis not present

## 2016-08-16 DIAGNOSIS — I10 Essential (primary) hypertension: Secondary | ICD-10-CM | POA: Diagnosis not present

## 2016-09-05 DIAGNOSIS — D509 Iron deficiency anemia, unspecified: Secondary | ICD-10-CM | POA: Diagnosis not present

## 2016-09-05 DIAGNOSIS — N184 Chronic kidney disease, stage 4 (severe): Secondary | ICD-10-CM | POA: Diagnosis not present

## 2016-09-18 DIAGNOSIS — Z6841 Body Mass Index (BMI) 40.0 and over, adult: Secondary | ICD-10-CM | POA: Diagnosis not present

## 2016-09-18 DIAGNOSIS — I129 Hypertensive chronic kidney disease with stage 1 through stage 4 chronic kidney disease, or unspecified chronic kidney disease: Secondary | ICD-10-CM | POA: Diagnosis not present

## 2016-09-18 DIAGNOSIS — E1122 Type 2 diabetes mellitus with diabetic chronic kidney disease: Secondary | ICD-10-CM | POA: Diagnosis not present

## 2016-09-18 DIAGNOSIS — N184 Chronic kidney disease, stage 4 (severe): Secondary | ICD-10-CM | POA: Diagnosis not present

## 2016-10-11 DIAGNOSIS — G4733 Obstructive sleep apnea (adult) (pediatric): Secondary | ICD-10-CM | POA: Diagnosis not present

## 2016-11-07 ENCOUNTER — Ambulatory Visit (INDEPENDENT_AMBULATORY_CARE_PROVIDER_SITE_OTHER): Payer: Medicare Other | Admitting: Podiatry

## 2016-11-07 ENCOUNTER — Encounter: Payer: Self-pay | Admitting: Podiatry

## 2016-11-07 VITALS — BP 154/75 | HR 67 | Resp 18

## 2016-11-07 DIAGNOSIS — E1142 Type 2 diabetes mellitus with diabetic polyneuropathy: Secondary | ICD-10-CM | POA: Diagnosis not present

## 2016-11-07 DIAGNOSIS — B351 Tinea unguium: Secondary | ICD-10-CM

## 2016-11-07 DIAGNOSIS — M79676 Pain in unspecified toe(s): Secondary | ICD-10-CM | POA: Diagnosis not present

## 2016-11-07 NOTE — Progress Notes (Signed)
Patient ID: Daniel Ochoa, male   DOB: May 14, 1941, 76 y.o.   MRN: [id number]   Subjective: This patient presents today again complaining of thickened and elongated toenails and requests toenail debridement. The patient's wife is present in the treatment room today Patient has a history of diabetes with neuropathy Patient is also requesting diabetic shoes  Patient appears orientated 3 with wife present to treatment room Vascular: Peripheral pitting edema bilaterally DP and PT pulses 2/4 bilaterally Capillary reflex immediate bilaterally  Neurological: Sensation to monofilament wire intact 2/5 right and 3/5 left Vibratory sensation nonreactive bilaterally Ankle reflex reactive bilaterally  Dermatological: No open skin lesions bilaterally Minimal plantar callus bilaterally The toenails are elongated, brittle, deformed, discolored and tender to direct palpation 6-10  Musculoskeletal: HAV deformities bilaterally Hammertoe 2-5 bilaterally  Assessment: Peripheral edema bilaterally Vascular status appears adequate Diabetic peripheral neuropathy Mycotic toenails 6-10 Plantar calluses 2  Plan: Debridement toenails 6-10 mechanically and left without any bleeding No debridement needed for plantar calluses 2 Obtain certification for diabetic shoes pending   Reappoint 3 months for skin a nail debridement

## 2016-11-07 NOTE — Patient Instructions (Signed)

## 2016-11-10 DIAGNOSIS — G4733 Obstructive sleep apnea (adult) (pediatric): Secondary | ICD-10-CM | POA: Diagnosis not present

## 2016-11-12 DIAGNOSIS — G4733 Obstructive sleep apnea (adult) (pediatric): Secondary | ICD-10-CM | POA: Diagnosis not present

## 2016-11-12 DIAGNOSIS — N184 Chronic kidney disease, stage 4 (severe): Secondary | ICD-10-CM | POA: Diagnosis not present

## 2016-11-12 DIAGNOSIS — R0902 Hypoxemia: Secondary | ICD-10-CM | POA: Diagnosis not present

## 2016-11-12 DIAGNOSIS — I2721 Secondary pulmonary arterial hypertension: Secondary | ICD-10-CM | POA: Diagnosis not present

## 2016-11-12 DIAGNOSIS — E1122 Type 2 diabetes mellitus with diabetic chronic kidney disease: Secondary | ICD-10-CM | POA: Diagnosis not present

## 2016-11-12 DIAGNOSIS — G8929 Other chronic pain: Secondary | ICD-10-CM | POA: Diagnosis not present

## 2016-11-12 DIAGNOSIS — Z86711 Personal history of pulmonary embolism: Secondary | ICD-10-CM | POA: Diagnosis not present

## 2016-11-12 DIAGNOSIS — E039 Hypothyroidism, unspecified: Secondary | ICD-10-CM | POA: Diagnosis not present

## 2016-11-12 DIAGNOSIS — I10 Essential (primary) hypertension: Secondary | ICD-10-CM | POA: Diagnosis not present

## 2016-11-14 DIAGNOSIS — E1165 Type 2 diabetes mellitus with hyperglycemia: Secondary | ICD-10-CM | POA: Diagnosis not present

## 2016-11-14 DIAGNOSIS — H903 Sensorineural hearing loss, bilateral: Secondary | ICD-10-CM | POA: Diagnosis not present

## 2016-11-14 DIAGNOSIS — E890 Postprocedural hypothyroidism: Secondary | ICD-10-CM | POA: Diagnosis not present

## 2016-11-14 DIAGNOSIS — H838X3 Other specified diseases of inner ear, bilateral: Secondary | ICD-10-CM | POA: Diagnosis not present

## 2016-11-14 DIAGNOSIS — H6123 Impacted cerumen, bilateral: Secondary | ICD-10-CM | POA: Diagnosis not present

## 2016-11-14 DIAGNOSIS — Z9641 Presence of insulin pump (external) (internal): Secondary | ICD-10-CM | POA: Diagnosis not present

## 2016-11-14 DIAGNOSIS — I10 Essential (primary) hypertension: Secondary | ICD-10-CM | POA: Diagnosis not present

## 2016-11-14 DIAGNOSIS — N184 Chronic kidney disease, stage 4 (severe): Secondary | ICD-10-CM | POA: Diagnosis not present

## 2017-01-18 DIAGNOSIS — I10 Essential (primary) hypertension: Secondary | ICD-10-CM | POA: Diagnosis not present

## 2017-01-18 DIAGNOSIS — E039 Hypothyroidism, unspecified: Secondary | ICD-10-CM | POA: Diagnosis not present

## 2017-01-18 DIAGNOSIS — N184 Chronic kidney disease, stage 4 (severe): Secondary | ICD-10-CM | POA: Diagnosis not present

## 2017-01-18 DIAGNOSIS — E1122 Type 2 diabetes mellitus with diabetic chronic kidney disease: Secondary | ICD-10-CM | POA: Diagnosis not present

## 2017-01-18 DIAGNOSIS — I270 Primary pulmonary hypertension: Secondary | ICD-10-CM | POA: Diagnosis not present

## 2017-01-18 DIAGNOSIS — D631 Anemia in chronic kidney disease: Secondary | ICD-10-CM | POA: Diagnosis not present

## 2017-01-18 DIAGNOSIS — G4733 Obstructive sleep apnea (adult) (pediatric): Secondary | ICD-10-CM | POA: Diagnosis not present

## 2017-01-18 DIAGNOSIS — G894 Chronic pain syndrome: Secondary | ICD-10-CM | POA: Diagnosis not present

## 2017-01-18 DIAGNOSIS — E784 Other hyperlipidemia: Secondary | ICD-10-CM | POA: Diagnosis not present

## 2017-01-18 DIAGNOSIS — G8929 Other chronic pain: Secondary | ICD-10-CM | POA: Diagnosis not present

## 2017-01-18 DIAGNOSIS — I519 Heart disease, unspecified: Secondary | ICD-10-CM | POA: Diagnosis not present

## 2017-01-18 DIAGNOSIS — M159 Polyosteoarthritis, unspecified: Secondary | ICD-10-CM | POA: Diagnosis not present

## 2017-01-23 DIAGNOSIS — N184 Chronic kidney disease, stage 4 (severe): Secondary | ICD-10-CM | POA: Diagnosis not present

## 2017-01-23 DIAGNOSIS — I129 Hypertensive chronic kidney disease with stage 1 through stage 4 chronic kidney disease, or unspecified chronic kidney disease: Secondary | ICD-10-CM | POA: Diagnosis not present

## 2017-01-23 DIAGNOSIS — E1122 Type 2 diabetes mellitus with diabetic chronic kidney disease: Secondary | ICD-10-CM | POA: Diagnosis not present

## 2017-01-23 DIAGNOSIS — E669 Obesity, unspecified: Secondary | ICD-10-CM | POA: Diagnosis not present

## 2017-01-30 ENCOUNTER — Encounter: Payer: Self-pay | Admitting: Surgery

## 2017-01-30 ENCOUNTER — Other Ambulatory Visit: Payer: Self-pay

## 2017-01-30 ENCOUNTER — Other Ambulatory Visit: Payer: Self-pay | Admitting: Surgery

## 2017-01-30 DIAGNOSIS — N186 End stage renal disease: Secondary | ICD-10-CM

## 2017-02-06 ENCOUNTER — Ambulatory Visit (INDEPENDENT_AMBULATORY_CARE_PROVIDER_SITE_OTHER): Payer: Medicare Other | Admitting: Podiatry

## 2017-02-06 ENCOUNTER — Encounter: Payer: Self-pay | Admitting: Podiatry

## 2017-02-06 DIAGNOSIS — B351 Tinea unguium: Secondary | ICD-10-CM

## 2017-02-06 DIAGNOSIS — E1142 Type 2 diabetes mellitus with diabetic polyneuropathy: Secondary | ICD-10-CM

## 2017-02-06 NOTE — Progress Notes (Signed)
Patient ID: Daniel Ochoa, male   DOB: Jun 30, 1941, 76 y.o.   MRN: [id number]    Subjective: This patient presents today again complaining of thickened and elongated toenails and requests toenail debridement. The patient's wife is present in the treatment room today Patient has a history of diabetes with neuropathy Patient is also requesting diabetic shoes  Patient appears orientated 3 with wife present to treatment room Vascular: Peripheral pitting edema bilaterally DP and PT pulses 2/4 bilaterally Capillary reflex immediate bilaterally  Neurological: Sensation to monofilament wire intact 2/5 right and 3/5 left Vibratory sensation nonreactive bilaterally Ankle reflex reactive bilaterally  Dermatological: No open skin lesions bilaterally Minimal plantar callus bilaterally Atrophic skin with absent hair growth bilaterally The toenails are elongated, brittle, deformed, discolored and tender to direct palpation 6-10  Musculoskeletal: HAV deformities bilaterally Hammertoe 2-5 bilaterally  Assessment: Peripheral edema bilaterally Vascular status appears adequate Diabetic peripheral neuropathy Mycotic toenails 6-10   Plan: Debridement toenails 6-10 mechanically and left without any bleeding No debridement needed for plantar calluses 2 Obtain certification for diabetic shoes pending   Reappoint 3 months for skin a nail debridement

## 2017-02-06 NOTE — Patient Instructions (Signed)

## 2017-02-13 DIAGNOSIS — N184 Chronic kidney disease, stage 4 (severe): Secondary | ICD-10-CM | POA: Diagnosis not present

## 2017-02-13 DIAGNOSIS — E1165 Type 2 diabetes mellitus with hyperglycemia: Secondary | ICD-10-CM | POA: Diagnosis not present

## 2017-02-13 DIAGNOSIS — I10 Essential (primary) hypertension: Secondary | ICD-10-CM | POA: Diagnosis not present

## 2017-02-13 DIAGNOSIS — E890 Postprocedural hypothyroidism: Secondary | ICD-10-CM | POA: Diagnosis not present

## 2017-02-25 DIAGNOSIS — G4733 Obstructive sleep apnea (adult) (pediatric): Secondary | ICD-10-CM | POA: Diagnosis not present

## 2017-02-27 ENCOUNTER — Encounter (HOSPITAL_COMMUNITY): Payer: Medicare Other

## 2017-02-27 ENCOUNTER — Encounter: Payer: Medicare Other | Admitting: Surgery

## 2017-02-27 ENCOUNTER — Other Ambulatory Visit (HOSPITAL_COMMUNITY): Payer: Medicare Other

## 2017-03-27 ENCOUNTER — Encounter: Payer: Self-pay | Admitting: Surgery

## 2017-03-29 DIAGNOSIS — E039 Hypothyroidism, unspecified: Secondary | ICD-10-CM | POA: Diagnosis not present

## 2017-03-29 DIAGNOSIS — G4733 Obstructive sleep apnea (adult) (pediatric): Secondary | ICD-10-CM | POA: Diagnosis not present

## 2017-03-29 DIAGNOSIS — N184 Chronic kidney disease, stage 4 (severe): Secondary | ICD-10-CM | POA: Diagnosis not present

## 2017-03-29 DIAGNOSIS — K921 Melena: Secondary | ICD-10-CM | POA: Diagnosis not present

## 2017-03-29 DIAGNOSIS — E1142 Type 2 diabetes mellitus with diabetic polyneuropathy: Secondary | ICD-10-CM | POA: Diagnosis not present

## 2017-03-29 DIAGNOSIS — M797 Fibromyalgia: Secondary | ICD-10-CM | POA: Diagnosis not present

## 2017-03-29 DIAGNOSIS — M5136 Other intervertebral disc degeneration, lumbar region: Secondary | ICD-10-CM | POA: Diagnosis not present

## 2017-03-29 DIAGNOSIS — I2721 Secondary pulmonary arterial hypertension: Secondary | ICD-10-CM | POA: Diagnosis not present

## 2017-03-29 DIAGNOSIS — Z0000 Encounter for general adult medical examination without abnormal findings: Secondary | ICD-10-CM | POA: Diagnosis not present

## 2017-03-29 DIAGNOSIS — E11319 Type 2 diabetes mellitus with unspecified diabetic retinopathy without macular edema: Secondary | ICD-10-CM | POA: Diagnosis not present

## 2017-03-29 DIAGNOSIS — I519 Heart disease, unspecified: Secondary | ICD-10-CM | POA: Diagnosis not present

## 2017-03-29 DIAGNOSIS — E1122 Type 2 diabetes mellitus with diabetic chronic kidney disease: Secondary | ICD-10-CM | POA: Diagnosis not present

## 2017-04-08 ENCOUNTER — Ambulatory Visit (HOSPITAL_COMMUNITY)
Admission: RE | Admit: 2017-04-08 | Discharge: 2017-04-08 | Disposition: A | Discharge status: Home / Self Care | Payer: Medicare Other | Source: Ambulatory Visit | Attending: Surgery | Admitting: Surgery

## 2017-04-08 ENCOUNTER — Ambulatory Visit (INDEPENDENT_AMBULATORY_CARE_PROVIDER_SITE_OTHER)
Admission: RE | Admit: 2017-04-08 | Discharge: 2017-04-08 | Disposition: A | Discharge status: Home / Self Care | Payer: Medicare Other | Source: Ambulatory Visit | Attending: Surgery | Admitting: Surgery

## 2017-04-08 ENCOUNTER — Encounter: Payer: Self-pay | Admitting: Surgery

## 2017-04-08 ENCOUNTER — Ambulatory Visit (INDEPENDENT_AMBULATORY_CARE_PROVIDER_SITE_OTHER): Payer: Medicare Other | Admitting: Surgery

## 2017-04-08 VITALS — BP 139/67 | HR 60 | Temp 97.8°F | Resp 20 | Ht 69.0 in | Wt 308.0 lb

## 2017-04-08 DIAGNOSIS — N185 Chronic kidney disease, stage 5: Secondary | ICD-10-CM | POA: Diagnosis not present

## 2017-04-08 DIAGNOSIS — Z01818 Encounter for other preprocedural examination: Secondary | ICD-10-CM | POA: Diagnosis not present

## 2017-04-08 DIAGNOSIS — N184 Chronic kidney disease, stage 4 (severe): Secondary | ICD-10-CM | POA: Diagnosis not present

## 2017-04-08 DIAGNOSIS — N186 End stage renal disease: Secondary | ICD-10-CM | POA: Diagnosis not present

## 2017-04-08 DIAGNOSIS — I82611 Acute embolism and thrombosis of superficial veins of right upper extremity: Secondary | ICD-10-CM | POA: Insufficient documentation

## 2017-04-08 DIAGNOSIS — Z48812 Encounter for surgical aftercare following surgery on the circulatory system: Secondary | ICD-10-CM

## 2017-04-08 NOTE — Progress Notes (Signed)
Vascular and Vein Specialist of [facility]  Patient name: Daniel Ochoa MRN: [id number] DOB: May 12, 1941 Sex: male  REASON FOR VISIT: Permanent dialysis access, requesting provider: Dr. Moshe Cipro  HPI:    Daniel Ochoa is a 76 y.o. male, with CKD stage V who presents for permanent dialysis access. He is not yet on hemodialysis. The patient is right-handed. He's never had access procedures in the past. He has never required temporary dialysis.   The patient's past medical history includes diabetes mellitus managed on insulin, hypertension managed on multiple medications including beta blocker, calcium channel blocker, Lasix. He is on Synthroid for hypothyroidism. Denies any history of chest pain or cardiac issues. He is not on any blood thinners. Past medical history of PE previously on Coumadin.  PAST MEDICAL HISTORY:   Past Medical History:  Diagnosis Date  . Chronic fatigue   . Chronic kidney disease, stage IV (severe) ([place])   . DM (diabetes mellitus), type 2, uncontrolled, with renal complications ([place])   . Essential hypertension   . Obesity   . OSA on CPAP     Family History  Problem Relation Age of Onset  . Cancer Father     SOCIAL HISTORY:   Social History   Social History  . Marital status: Married    Spouse name: N/A  . Number of children: 1  . Years of education: N/A   Occupational History  . Not on file.   Social History Main Topics  . Smoking status: Former Research scientist (life sciences)  . Smokeless tobacco: Never Used  . Alcohol use No  . Drug use: No  . Sexual activity: Not on file   Other Topics Concern  . Not on file   Social History Narrative  . No narrative on file    Allergies  Allergen Reactions  . Ace Inhibitors Shortness Of Breath    choking  . Statins Other (See Comments)    Elevated CK level  . Latex Rash    Severe blistery rash  . Sudafed [Pseudoephedrine Hcl] Rash    Current Outpatient Prescriptions  Medication Sig Dispense Refill  . amLODipine  (NORVASC) 5 MG tablet     . atenolol (TENORMIN) 50 MG tablet Take 50 mg by mouth 2 (two) times daily.     . clonazePAM (KLONOPIN) 2 MG tablet Take 2 mg by mouth daily.     Marland Kitchen donepezil (ARICEPT) 5 MG tablet Take 5 mg by mouth daily.     Marland Kitchen DYMISTA 137-50 MCG/ACT SUSP Place 1 spray into both nostrils 2 (two) times daily.     . famotidine (PEPCID) 40 MG tablet Take 40 mg by mouth at bedtime.     Marland Kitchen FREESTYLE TEST STRIPS test strip   3  . furosemide (LASIX) 40 MG tablet     . HYDROcodone-acetaminophen (NORCO/VICODIN) 5-325 MG per tablet Take 1 tablet by mouth every 6 (six) hours.     . Insulin Human (INSULIN PUMP) SOLN Inject 1 each into the skin continuous. Humulin R    . irbesartan (AVAPRO) 150 MG tablet     . irbesartan (AVAPRO) 75 MG tablet Take 1 tablet (75 mg total) by mouth daily. 90 tablet 3  . loratadine (CLARITIN) 10 MG tablet Take 10 mg by mouth daily.    Marland Kitchen SYNTHROID 200 MCG tablet Take 200 mcg by mouth daily before breakfast.     . warfarin (COUMADIN) 2.5 MG tablet Take 10mg  (4 tablets) on 7/31 and 8/1. Then take 7.5mg  (3 tablets) on 8/2 and then  as per your doctor's instructions on 8/3. 60 tablet 2   No current facility-administered medications for this visit.     REVIEW OF SYSTEMS:   REVIEW OF SYSTEMS (negative unless checked):   Cardiac:  []  Chest pain or chest pressure? [x]  Shortness of breath upon activity? []  Shortness of breath when lying flat? []  Irregular heart rhythm?  Vascular:  []  Pain in calf, thigh, or hip brought on by walking? []  Pain in feet at night that wakes you up from your sleep? []  Blood clot in your veins? []  Leg swelling?  Pulmonary:  []  Oxygen at home? []  Productive cough? []  Wheezing?  Neurologic:  []  Sudden weakness in arms or legs? []  Sudden numbness in arms or legs? []  Sudden onset of difficult speaking or slurred speech? []  Temporary loss of vision in one eye? []  Problems with dizziness?  Gastrointestinal:  []  Blood in stool? []   Vomited blood?  Genitourinary:  []  Burning when urinating? []  Blood in urine?  Psychiatric:  []  Major depression  Hematologic:  []  Bleeding problems? []  Problems with blood clotting?  Dermatologic:  []  Rashes or ulcers?  Constitutional:  []  Fever or chills?  Ear/Nose/Throat:  []  Change in hearing? []  Nose bleeds? []  Sore throat?  Musculoskeletal:  []  Back pain? []  Joint pain? []  Muscle pain?  PHYSICAL EXAM:   Vitals:   04/08/17 1215  BP: 139/67  Pulse: 60  Resp: 20  Temp: 97.8 F (36.6 C)  SpO2: 98%  Weight: (!) 308 lb (139.7 kg)  Height: 5\' 9"  (1.753 m)    GENERAL: The patient is a well-nourished male, in no acute distress. The vital signs are documented above. CARDIAC: There is a regular rate and rhythm. No carotid bruits. VASCULAR: 2+ radial, and 2+ brachial pulses bilaterally. PULMONARY: There is good air exchange bilaterally without wheezing or rales. MUSCULOSKELETAL: There are no major deformities or cyanosis. NEUROLOGIC: No focal weakness or paresthesias are detected. SKIN: There are no ulcers or rashes noted. PSYCHIATRIC: The patient has a normal affect.  DATA:    Vein mapping 16/06/9603  Left cephalic vein is 5.40 cm at the wrist, 0.41 cm at mid forearm, 0.79 cm at antecubital fossa, greater than 0.3 cm throughout the upper arm  Upper extremity arterial duplex 04/08/2017  Triphasic waveforms in brachial, radial and ulnar arteries bilaterally  ASSESSMENT/PLAN:   CKD stage V  Right-handed. He is not yet on hemodialysis. He appears to have an adequate left cephalic vein for a left radiocephalic versus brachiocephalic fistula. The procedure, risks and benefits were discussed at length with the patient in he is willing to proceed. The patient is morbidly [place]. Therefore, did discuss the possibility of needing to superficialize his fistula in the future. He will be scheduled for left arm fistula on 04/18/2017 with Dr. Trula Slade.    Virgina Jock, PA-C [facility]ascular and Vein Specialists of [facility] MD: Trula Slade  I agree with the above.  I have seen and evaluated the patient.  I feel he is a good candidate for a left radiocephalic or possibly brachiocephalic fistula.  I discussed the risk of non-maturity and the risk of steal with the patient.  We also discussed the possibility of future interventions including elevation.  His procedure will be scheduled for Thursday, August 2.  [name]

## 2017-04-15 ENCOUNTER — Other Ambulatory Visit: Payer: Self-pay

## 2017-04-16 ENCOUNTER — Encounter (HOSPITAL_COMMUNITY): Payer: Self-pay | Admitting: *Deleted

## 2017-04-16 NOTE — Progress Notes (Signed)
Mr Raden denies chest pain, becomes short of breath with short walk is in the house.  Mr Schara has been disable since age 76 and sits in chair most of the time per his wife.  Patient has Type II diabetes and has an Insulin Pump.  Patient reports that CBG runs in the 110- 120 range. I instructed patient to decrease the basal rate by 20 % at midnight Wednesday night.  Patients endocrinologist is Dr Chalmers Cater, notes requested from her office.PCP was at [facility], but it is closing, patient will be seeing Dr Bevelyn Buckles. Patient thinks that he has had an EKG at [facility] in the past year.  We will request records. I instructed patient to check CBG when he awakens and every 2 hours until arrival, if CBG is less than 70 to take 4 glucose tablets, repeat CBG in 15 minutes then call pre op desk. Patient's wife took notes and repeated information.

## 2017-04-17 ENCOUNTER — Encounter (HOSPITAL_COMMUNITY): Payer: Self-pay | Admitting: Vascular Surgery

## 2017-04-17 NOTE — Progress Notes (Signed)
Anesthesia Chart Review: SAME DAY WORK-UP.  Patient is a 76 year old male scheduled for LUE AVF creation on 04/18/17 by Dr. Trula Slade.  History includes CKD stage V (not yet on hemodialysis), former smoker, HTN, DM2 (on insulin pump), neuropathy, fibromyalgia, chronic fatigue, arthritis, dyspnea, OSA (CPAP), obesity, partial thyroidectomy '78 (benign nodules), hypothyroidism, RLE DVT/PE 03/2014 (treated with Lovenox/warfarin, O2), cervical laminectomy, lumbar fusion. Echo from 03/2014 showed severe pulmonary hypertension, but done in the setting of DVT/probable acute PE.   - PCP is listed as Dr. Antony Blackbird, but [facility] office is reportedly closing and he will start seeing Dr. Bevelyn Buckles. - Endocrinologist is Dr. Jacelyn Pi. Per 02/13/17 note, A1c was 7.3%. - Nephrologist is Dr. Corliss Parish. - Pulmonologist is Dr. Christinia Gully, but PRN follow-up recommended after 08/24/14 visit.    Meds include amlodipine, atenolol, clonazepam, Aricept, Dymista, Pepcid, Lasix, Norco, Humulin R 500 Unit/ml (insulin pump), Synthroid, nystatin. Patient was previously instructed by PAT RN to reduced insulin pump basal rate by 20% while NPO. Since he is actually on a U500 insulin pump, I contacted Dr. Almetta Lovely office for clarification. Per staff, she recommended that patient reduce basal rate by 50% while NPO and keep at 50% until discharged home (if same day discharge planned). If being admitted then discontinue pump (would need alternate insulin administration) until seen by hospitalist or DM team for further instructions. If he required clear liquid diet then would also need to stay at 50% basal until taking solids. I instructed patient to reduce basal rate by 50% at MN once NPO. He verbalized understanding. I also notified Bethena Roys, DM educator that patient has a U500 insulin pump.  Echo 04/14/14 (in the setting of acute PE/intermediate risk PE VQ scan with + DVT): Study Conclusions - Left ventricle:  The cavity size was normal. Wall thickness was increased in a pattern of moderate LVH. Systolic function was normal. The estimated ejection fraction was in the range of 55% to 60%. Wall motion was normal; there were no regional wall motion abnormalities. Doppler parameters are consistent with abnormal left ventricular relaxation (grade 1 diastolic dysfunction). - Tricuspid valve: There was moderate regurgitation. - Pulmonary arteries: PA peak pressure: 89 mm Hg (S).  Patient is a same day work-up with worsening renal function and anticipated need for hemodialysis in the near future. He denied chest pain, but does have chronic SOB with walking even short distances. He denied any recent changes. He does not use supplemental oxygen and is compliant with CPAP. He denied any additional cardiac testing since his DVT/PE. He thinks he may have had an EKG within the past year at [facility]. If not received, or > 1 year ago then he will need an EKG on arrival. He is for labs on arrival. Definitive plan following anesthesiologist evaluation tomorrow.  George Hugh [facility] Short Stay Center/Anesthesiology Phone [phone] 04/17/2017 5:06 PM

## 2017-04-18 ENCOUNTER — Ambulatory Visit (HOSPITAL_COMMUNITY)
Admission: RE | Admit: 2017-04-18 | Discharge: 2017-04-18 | Disposition: A | Discharge status: Home / Self Care | Payer: Medicare Other | Source: Ambulatory Visit | Attending: Surgery | Admitting: Surgery

## 2017-04-18 ENCOUNTER — Ambulatory Visit (HOSPITAL_COMMUNITY): Payer: Medicare Other | Admitting: Vascular Surgery

## 2017-04-18 ENCOUNTER — Encounter (HOSPITAL_COMMUNITY)
Admission: RE | Disposition: A | Discharge status: Home / Self Care | Payer: Self-pay | Source: Ambulatory Visit | Attending: Surgery

## 2017-04-18 ENCOUNTER — Encounter (HOSPITAL_COMMUNITY): Payer: Self-pay | Admitting: Urology

## 2017-04-18 DIAGNOSIS — I120 Hypertensive chronic kidney disease with stage 5 chronic kidney disease or end stage renal disease: Secondary | ICD-10-CM | POA: Diagnosis not present

## 2017-04-18 DIAGNOSIS — I2782 Chronic pulmonary embolism: Secondary | ICD-10-CM | POA: Insufficient documentation

## 2017-04-18 DIAGNOSIS — G4733 Obstructive sleep apnea (adult) (pediatric): Secondary | ICD-10-CM | POA: Diagnosis not present

## 2017-04-18 DIAGNOSIS — Z7901 Long term (current) use of anticoagulants: Secondary | ICD-10-CM | POA: Diagnosis not present

## 2017-04-18 DIAGNOSIS — N185 Chronic kidney disease, stage 5: Secondary | ICD-10-CM | POA: Insufficient documentation

## 2017-04-18 DIAGNOSIS — E039 Hypothyroidism, unspecified: Secondary | ICD-10-CM | POA: Diagnosis not present

## 2017-04-18 DIAGNOSIS — Z91040 Latex allergy status: Secondary | ICD-10-CM | POA: Insufficient documentation

## 2017-04-18 DIAGNOSIS — Z79899 Other long term (current) drug therapy: Secondary | ICD-10-CM | POA: Diagnosis not present

## 2017-04-18 DIAGNOSIS — E669 Obesity, unspecified: Secondary | ICD-10-CM | POA: Diagnosis not present

## 2017-04-18 DIAGNOSIS — Z87891 Personal history of nicotine dependence: Secondary | ICD-10-CM | POA: Diagnosis not present

## 2017-04-18 DIAGNOSIS — E1122 Type 2 diabetes mellitus with diabetic chronic kidney disease: Secondary | ICD-10-CM | POA: Diagnosis not present

## 2017-04-18 DIAGNOSIS — R5382 Chronic fatigue, unspecified: Secondary | ICD-10-CM | POA: Diagnosis not present

## 2017-04-18 DIAGNOSIS — E1140 Type 2 diabetes mellitus with diabetic neuropathy, unspecified: Secondary | ICD-10-CM | POA: Diagnosis not present

## 2017-04-18 DIAGNOSIS — Z794 Long term (current) use of insulin: Secondary | ICD-10-CM | POA: Insufficient documentation

## 2017-04-18 DIAGNOSIS — N184 Chronic kidney disease, stage 4 (severe): Secondary | ICD-10-CM | POA: Diagnosis not present

## 2017-04-18 HISTORY — DX: Constipation, unspecified: K59.00

## 2017-04-18 HISTORY — DX: Unspecified osteoarthritis, unspecified site: M19.90

## 2017-04-18 HISTORY — DX: Acute embolism and thrombosis of unspecified deep veins of unspecified lower extremity: I82.409

## 2017-04-18 HISTORY — DX: Hypothyroidism, unspecified: E03.9

## 2017-04-18 HISTORY — DX: Dyspnea, unspecified: R06.00

## 2017-04-18 HISTORY — DX: Fibromyalgia: M79.7

## 2017-04-18 HISTORY — PX: AV FISTULA PLACEMENT: SHX1204

## 2017-04-18 HISTORY — DX: Polyneuropathy, unspecified: G62.9

## 2017-04-18 HISTORY — DX: Other complications of anesthesia, initial encounter: T88.59XA

## 2017-04-18 HISTORY — DX: Adverse effect of unspecified anesthetic, initial encounter: T41.45XA

## 2017-04-18 LAB — POCT I-STAT 4, (NA,K, GLUC, HGB,HCT)
Glucose, Bld: 168 mg/dL — ABNORMAL HIGH (ref 65–99)
HCT: 41 % (ref 39.0–52.0)
Hemoglobin: 13.9 g/dL (ref 13.0–17.0)
Potassium: 3.8 mmol/L (ref 3.5–5.1)
Sodium: 140 mmol/L (ref 135–145)

## 2017-04-18 LAB — GLUCOSE, CAPILLARY
Glucose-Capillary: 122 mg/dL — ABNORMAL HIGH (ref 65–99)
Glucose-Capillary: 184 mg/dL — ABNORMAL HIGH (ref 65–99)

## 2017-04-18 SURGERY — ARTERIOVENOUS (AV) FISTULA CREATION
Anesthesia: Monitor Anesthesia Care | Site: Arm Upper | Laterality: Left

## 2017-04-18 MED ORDER — LIDOCAINE-EPINEPHRINE (PF) 1 %-1:200000 IJ SOLN
INTRAMUSCULAR | Status: AC
  Filled 2017-04-18: qty 30

## 2017-04-18 MED ORDER — DEXMEDETOMIDINE HCL 200 MCG/2ML IV SOLN: INTRAVENOUS | Status: DC | PRN

## 2017-04-18 MED ORDER — 0.9 % SODIUM CHLORIDE (POUR BTL) OPTIME
TOPICAL | Status: DC | PRN
  Administered 2017-04-18: 1000 mL

## 2017-04-18 MED ORDER — LIDOCAINE HCL 1 % IJ SOLN
INTRAMUSCULAR | Status: DC | PRN
  Administered 2017-04-18: 40 mL

## 2017-04-18 MED ORDER — DEXMEDETOMIDINE HCL IN NACL 400 MCG/100ML IV SOLN
0.4000 ug/kg/h | INTRAVENOUS | Status: AC
  Administered 2017-04-18: .8 ug/kg/h via INTRAVENOUS
  Filled 2017-04-18: qty 100

## 2017-04-18 MED ORDER — OXYCODONE HCL 5 MG/5ML PO SOLN: 5.0000 mg | Freq: Once | ORAL | Status: DC | PRN

## 2017-04-18 MED ORDER — OXYCODONE HCL 5 MG PO TABS: 5.0000 mg | ORAL_TABLET | Freq: Once | ORAL | Status: DC | PRN

## 2017-04-18 MED ORDER — DEXMEDETOMIDINE HCL 200 MCG/2ML IV SOLN
INTRAVENOUS | Status: DC | PRN
  Administered 2017-04-18 (×2): 300 ug via INTRAVENOUS

## 2017-04-18 MED ORDER — SODIUM CHLORIDE 0.9 % IV SOLN
INTRAVENOUS | Status: DC | PRN
  Administered 2017-04-18: 500 mL

## 2017-04-18 MED ORDER — LIDOCAINE HCL (PF) 1 % IJ SOLN
INTRAMUSCULAR | Status: AC
  Filled 2017-04-18: qty 30

## 2017-04-18 MED ORDER — FENTANYL CITRATE (PF) 100 MCG/2ML IJ SOLN: 25.0000 ug | INTRAMUSCULAR | Status: DC | PRN

## 2017-04-18 MED ORDER — CHLORHEXIDINE GLUCONATE CLOTH 2 % EX PADS: 6.0000 | MEDICATED_PAD | Freq: Once | CUTANEOUS | Status: DC

## 2017-04-18 MED ORDER — PROPOFOL 10 MG/ML IV BOLUS
INTRAVENOUS | Status: AC
  Filled 2017-04-18: qty 40

## 2017-04-18 MED ORDER — FENTANYL CITRATE (PF) 100 MCG/2ML IJ SOLN
INTRAMUSCULAR | Status: DC | PRN
  Administered 2017-04-18 (×4): 25 ug via INTRAVENOUS

## 2017-04-18 MED ORDER — SODIUM CHLORIDE 0.9 % IV SOLN
INTRAVENOUS | Status: DC
  Administered 2017-04-18: 11:00:00 via INTRAVENOUS

## 2017-04-18 MED ORDER — FENTANYL CITRATE (PF) 250 MCG/5ML IJ SOLN
INTRAMUSCULAR | Status: AC
  Filled 2017-04-18: qty 5

## 2017-04-18 MED ORDER — ONDANSETRON HCL 4 MG/2ML IJ SOLN: 4.0000 mg | Freq: Once | INTRAMUSCULAR | Status: DC | PRN

## 2017-04-18 MED ORDER — DEXTROSE 5 % IV SOLN
1.5000 g | INTRAVENOUS | Status: AC
  Administered 2017-04-18: 1.5 g via INTRAVENOUS

## 2017-04-18 MED ORDER — HEPARIN SODIUM (PORCINE) 1000 UNIT/ML IJ SOLN
INTRAMUSCULAR | Status: DC | PRN
  Administered 2017-04-18: 10000 [IU] via INTRAVENOUS

## 2017-04-18 MED ORDER — DEXTROSE 5 % IV SOLN
INTRAVENOUS | Status: AC
  Filled 2017-04-18: qty 1.5

## 2017-04-18 MED ORDER — OXYCODONE-ACETAMINOPHEN 5-325 MG PO TABS: 1.0000 | ORAL_TABLET | Freq: Four times a day (QID) | ORAL | 0 refills | Status: DC | PRN

## 2017-04-18 MED ORDER — SODIUM CHLORIDE 0.9 % IV SOLN
INTRAVENOUS | Status: DC
  Administered 2017-04-18: 08:00:00 via INTRAVENOUS

## 2017-04-18 SURGICAL SUPPLY — 39 items
ARMBAND PINK RESTRICT EXTREMIT (MISCELLANEOUS) ×4 IMPLANT
BANDAGE ELASTIC 4 VELCRO ST LF (GAUZE/BANDAGES/DRESSINGS) ×2 IMPLANT
BNDG GAUZE ELAST 4 BULKY (GAUZE/BANDAGES/DRESSINGS) ×2 IMPLANT
CANISTER SUCT 3000ML PPV (MISCELLANEOUS) ×2 IMPLANT
CLIP TI WIDE RED SMALL 6 (CLIP) ×2 IMPLANT
CLIP VESOCCLUDE MED 6/CT (CLIP) ×2 IMPLANT
CLIP VESOCCLUDE SM WIDE 6/CT (CLIP) ×2 IMPLANT
COVER PROBE W GEL 5X96 (DRAPES) ×2 IMPLANT
DERMABOND ADVANCED (GAUZE/BANDAGES/DRESSINGS) ×1
DERMABOND ADVANCED .7 DNX12 (GAUZE/BANDAGES/DRESSINGS) ×1 IMPLANT
ELECT REM PT RETURN 9FT ADLT (ELECTROSURGICAL) ×2
ELECTRODE REM PT RTRN 9FT ADLT (ELECTROSURGICAL) ×1 IMPLANT
GAUZE SPONGE 4X4 16PLY XRAY LF (GAUZE/BANDAGES/DRESSINGS) ×2 IMPLANT
GLOVE BIOGEL PI IND STRL 7.0 (GLOVE) ×1 IMPLANT
GLOVE BIOGEL PI IND STRL 7.5 (GLOVE) ×1 IMPLANT
GLOVE BIOGEL PI IND STRL 8 (GLOVE) ×1 IMPLANT
GLOVE BIOGEL PI INDICATOR 7.0 (GLOVE) ×1
GLOVE BIOGEL PI INDICATOR 7.5 (GLOVE) ×1
GLOVE BIOGEL PI INDICATOR 8 (GLOVE) ×1
GLOVE SURG SS PI 6.5 STRL IVOR (GLOVE) ×4 IMPLANT
GLOVE SURG SS PI 7.0 STRL IVOR (GLOVE) ×2 IMPLANT
GLOVE SURG SS PI 7.5 STRL IVOR (GLOVE) ×6 IMPLANT
GOWN STRL REUS W/ TWL LRG LVL3 (GOWN DISPOSABLE) ×3 IMPLANT
GOWN STRL REUS W/ TWL XL LVL3 (GOWN DISPOSABLE) ×1 IMPLANT
GOWN STRL REUS W/TWL LRG LVL3 (GOWN DISPOSABLE) ×3
GOWN STRL REUS W/TWL XL LVL3 (GOWN DISPOSABLE) ×1
HEMOSTAT SNOW SURGICEL 2X4 (HEMOSTASIS) IMPLANT
KIT BASIN OR (CUSTOM PROCEDURE TRAY) ×2 IMPLANT
KIT ROOM TURNOVER OR (KITS) ×2 IMPLANT
NS IRRIG 1000ML POUR BTL (IV SOLUTION) ×2 IMPLANT
PACK CV ACCESS (CUSTOM PROCEDURE TRAY) ×2 IMPLANT
PAD ARMBOARD 7.5X6 YLW CONV (MISCELLANEOUS) ×4 IMPLANT
SUT PROLENE 6 0 CC (SUTURE) ×2 IMPLANT
SUT VIC AB 3-0 SH 27 (SUTURE) ×1
SUT VIC AB 3-0 SH 27X BRD (SUTURE) ×1 IMPLANT
SUT VIC AB 4-0 PS2 27 (SUTURE) ×2 IMPLANT
SUT VICRYL 4-0 PS2 18IN ABS (SUTURE) ×2 IMPLANT
UNDERPAD 30X30 (UNDERPADS AND DIAPERS) ×2 IMPLANT
WATER STERILE IRR 1000ML POUR (IV SOLUTION) ×2 IMPLANT

## 2017-04-18 NOTE — Anesthesia Preprocedure Evaluation (Signed)
Anesthesia Evaluation  Patient identified by MRN, date of birth, ID band Patient awake    Reviewed: Allergy & Precautions, NPO status , Patient's Chart, lab work & pertinent test results  Airway Mallampati: III  TM Distance: >3 FB Neck ROM: Full    Dental  (+) Edentulous Upper, Edentulous Lower, Dental Advisory Given   Pulmonary former smoker,    breath sounds clear to auscultation       Cardiovascular hypertension,  Rhythm:Regular Rate:Normal     Neuro/Psych    GI/Hepatic   Endo/Other  diabetes  Renal/GU      Musculoskeletal   Abdominal (+) + obese,   Peds  Hematology   Anesthesia Other Findings   Reproductive/Obstetrics                             Anesthesia Physical Anesthesia Plan  ASA: III  Anesthesia Plan: General   Post-op Pain Management:    Induction: Intravenous  PONV Risk Score and Plan: Ondansetron  Airway Management Planned: LMA  Additional Equipment:   Intra-op Plan:   Post-operative Plan:   Informed Consent: I have reviewed the patients History and Physical, chart, labs and discussed the procedure including the risks, benefits and alternatives for the proposed anesthesia with the patient or authorized representative who has indicated his/her understanding and acceptance.   Dental advisory given  Plan Discussed with: CRNA and Anesthesiologist  Anesthesia Plan Comments:         Anesthesia Quick Evaluation

## 2017-04-18 NOTE — Transfer of Care (Signed)
Immediate Anesthesia Transfer of Care Note  Patient: Daniel Ochoa  Procedure(s) Performed: Procedure(s): ARTERIOVENOUS (AV) FISTULA CREATION (Left)  Patient Location: PACU  Anesthesia Type:MAC  Level of Consciousness: awake, alert , oriented and sedated  Airway & Oxygen Therapy: Patient Spontanous Breathing and Patient connected to nasal cannula oxygen  Post-op Assessment: Report given to RN, Post -op Vital signs reviewed and stable and Patient moving all extremities  Post vital signs: Reviewed and stable  Last Vitals:  Vitals:   04/18/17 1253 04/18/17 1254  BP:  (!) 106/49  Pulse:  (!) 53  Resp:  16  Temp: (!) 36.1 C     Last Pain:  Vitals:   04/18/17 0709  TempSrc: Oral      Patients Stated Pain Goal: 3 (88/41/66 0630)  Complications: No apparent anesthesia complications

## 2017-04-18 NOTE — Anesthesia Postprocedure Evaluation (Signed)
Anesthesia Post Note  Patient: Daniel Ochoa  Procedure(s) Performed: Procedure(s) (LRB): ARTERIOVENOUS (AV) FISTULA CREATION (Left)     Patient location during evaluation: PACU Anesthesia Type: MAC Level of consciousness: awake, awake and alert and oriented Pain management: pain level controlled Vital Signs Assessment: post-procedure vital signs reviewed and stable Respiratory status: spontaneous breathing, nonlabored ventilation and respiratory function stable Cardiovascular status: blood pressure returned to baseline Anesthetic complications: no    Last Vitals:  Vitals:   04/18/17 1405 04/18/17 1410  BP:  (!) 125/50  Pulse: (!) 50 (!) 50  Resp: (!) 21 20  Temp: (!) 36.4 C 36.7 C    Last Pain:  Vitals:   04/18/17 0709  TempSrc: Oral                 Meghana Tullo COKER

## 2017-04-18 NOTE — Interval H&P Note (Signed)
History and Physical Interval Note:  04/18/2017 9:57 AM  Daniel Ochoa  has presented today for surgery, with the diagnosis of Chronic Kidney Disease  The various methods of treatment have been discussed with the patient and family. After consideration of risks, benefits and other options for treatment, the patient has consented to  Procedure(s): ARTERIOVENOUS (AV) FISTULA CREATION (Left) as a surgical intervention .  The patient's history has been reviewed, patient examined, no change in status, stable for surgery.  I have reviewed the patient's chart and labs.  Questions were answered to the patient's satisfaction.     [name]

## 2017-04-18 NOTE — Anesthesia Procedure Notes (Signed)
Date/Time: 04/18/2017 11:05 AM Performed by: Scheryl Darter Pre-anesthesia Checklist: Patient identified, Emergency Drugs available, Suction available, Patient being monitored and Timeout performed Oxygen Delivery Method: Simple face mask Placement Confirmation: positive ETCO2

## 2017-04-18 NOTE — H&P (View-Only) (Signed)
Vascular and Vein Specialist of [facility]  Patient name: Daniel Ochoa MRN: [id number] DOB: [birth year] Sex: male  REASON FOR VISIT: Permanent dialysis access, requesting provider: Dr. Moshe Cipro  HPI:    Daniel Ochoa is a 76 y.o. male, with CKD stage V who presents for permanent dialysis access. He is not yet on hemodialysis. The patient is right-handed. He's never had access procedures in the past. He has never required temporary dialysis.   The patient's past medical history includes diabetes mellitus managed on insulin, hypertension managed on multiple medications including beta blocker, calcium channel blocker, Lasix. He is on Synthroid for hypothyroidism. Denies any history of chest pain or cardiac issues. He is not on any blood thinners. Past medical history of PE previously on Coumadin.  PAST MEDICAL HISTORY:   Past Medical History:  Diagnosis Date  . Chronic fatigue   . Chronic kidney disease, stage IV (severe) ([place])   . DM (diabetes mellitus), type 2, uncontrolled, with renal complications ([place])   . Essential hypertension   . Obesity   . OSA on CPAP     Family History  Problem Relation Age of Onset  . Cancer Father     SOCIAL HISTORY:   Social History   Social History  . Marital status: Married    Spouse name: N/A  . Number of children: 1  . Years of education: N/A   Occupational History  . Not on file.   Social History Main Topics  . Smoking status: Former Research scientist (life sciences)  . Smokeless tobacco: Never Used  . Alcohol use No  . Drug use: No  . Sexual activity: Not on file   Other Topics Concern  . Not on file   Social History Narrative  . No narrative on file    Allergies  Allergen Reactions  . Ace Inhibitors Shortness Of Breath    choking  . Statins Other (See Comments)    Elevated CK level  . Latex Rash    Severe blistery rash  . Sudafed [Pseudoephedrine Hcl] Rash    Current Outpatient Prescriptions  Medication Sig Dispense Refill  . amLODipine  (NORVASC) 5 MG tablet     . atenolol (TENORMIN) 50 MG tablet Take 50 mg by mouth 2 (two) times daily.     . clonazePAM (KLONOPIN) 2 MG tablet Take 2 mg by mouth daily.     Marland Kitchen donepezil (ARICEPT) 5 MG tablet Take 5 mg by mouth daily.     Marland Kitchen DYMISTA 137-50 MCG/ACT SUSP Place 1 spray into both nostrils 2 (two) times daily.     . famotidine (PEPCID) 40 MG tablet Take 40 mg by mouth at bedtime.     Marland Kitchen FREESTYLE TEST STRIPS test strip   3  . furosemide (LASIX) 40 MG tablet     . HYDROcodone-acetaminophen (NORCO/VICODIN) 5-325 MG per tablet Take 1 tablet by mouth every 6 (six) hours.     . Insulin Human (INSULIN PUMP) SOLN Inject 1 each into the skin continuous. Humulin R    . irbesartan (AVAPRO) 150 MG tablet     . irbesartan (AVAPRO) 75 MG tablet Take 1 tablet (75 mg total) by mouth daily. 90 tablet 3  . loratadine (CLARITIN) 10 MG tablet Take 10 mg by mouth daily.    Marland Kitchen SYNTHROID 200 MCG tablet Take 200 mcg by mouth daily before breakfast.     . warfarin (COUMADIN) 2.5 MG tablet Take 10mg  (4 tablets) on 7/31 and 8/1. Then take 7.5mg  (3 tablets) on 8/2 and then  as per your doctor's instructions on 8/3. 60 tablet 2   No current facility-administered medications for this visit.     REVIEW OF SYSTEMS:   REVIEW OF SYSTEMS (negative unless checked):   Cardiac:  []  Chest pain or chest pressure? [x]  Shortness of breath upon activity? []  Shortness of breath when lying flat? []  Irregular heart rhythm?  Vascular:  []  Pain in calf, thigh, or hip brought on by walking? []  Pain in feet at night that wakes you up from your sleep? []  Blood clot in your veins? []  Leg swelling?  Pulmonary:  []  Oxygen at home? []  Productive cough? []  Wheezing?  Neurologic:  []  Sudden weakness in arms or legs? []  Sudden numbness in arms or legs? []  Sudden onset of difficult speaking or slurred speech? []  Temporary loss of vision in one eye? []  Problems with dizziness?  Gastrointestinal:  []  Blood in stool? []   Vomited blood?  Genitourinary:  []  Burning when urinating? []  Blood in urine?  Psychiatric:  []  Major depression  Hematologic:  []  Bleeding problems? []  Problems with blood clotting?  Dermatologic:  []  Rashes or ulcers?  Constitutional:  []  Fever or chills?  Ear/Nose/Throat:  []  Change in hearing? []  Nose bleeds? []  Sore throat?  Musculoskeletal:  []  Back pain? []  Joint pain? []  Muscle pain?  PHYSICAL EXAM:   Vitals:   04/08/17 1215  BP: 139/67  Pulse: 60  Resp: 20  Temp: 97.8 F (36.6 C)  SpO2: 98%  Weight: (!) 308 lb (139.7 kg)  Height: 5\' 9"  (1.753 m)    GENERAL: The patient is a well-nourished male, in no acute distress. The vital signs are documented above. CARDIAC: There is a regular rate and rhythm. No carotid bruits. VASCULAR: 2+ radial, and 2+ brachial pulses bilaterally. PULMONARY: There is good air exchange bilaterally without wheezing or rales. MUSCULOSKELETAL: There are no major deformities or cyanosis. NEUROLOGIC: No focal weakness or paresthesias are detected. SKIN: There are no ulcers or rashes noted. PSYCHIATRIC: The patient has a normal affect.  DATA:    Vein mapping 09/25/3233  Left cephalic vein is 5.73 cm at the wrist, 0.41 cm at mid forearm, 0.79 cm at antecubital fossa, greater than 0.3 cm throughout the upper arm  Upper extremity arterial duplex 04/08/2017  Triphasic waveforms in brachial, radial and ulnar arteries bilaterally  ASSESSMENT/PLAN:   CKD stage V  Right-handed. He is not yet on hemodialysis. He appears to have an adequate left cephalic vein for a left radiocephalic versus brachiocephalic fistula. The procedure, risks and benefits were discussed at length with the patient in he is willing to proceed. The patient is morbidly [place]. Therefore, did discuss the possibility of needing to superficialize his fistula in the future. He will be scheduled for left arm fistula on 04/18/2017 with Dr. Trula Slade.    Virgina Jock, PA-C [facility] MD: Trula Slade  I agree with the above.  I have seen and evaluated the patient.  I feel he is a good candidate for a left radiocephalic or possibly brachiocephalic fistula.  I discussed the risk of non-maturity and the risk of steal with the patient.  We also discussed the possibility of future interventions including elevation.  His procedure will be scheduled for Thursday, August 2.  [name]

## 2017-04-18 NOTE — Op Note (Signed)
    NAME: Daniel Ochoa    MRN: [id number] DOB: 1940/11/30    DATE OF OPERATION: 04/18/2017  PREOP DIAGNOSIS:    Stage IV chronic kidney disease  POSTOP DIAGNOSIS:    Same  PROCEDURE:    LEFT BRACHIOCEPHALIC AV FISTULA SUPERFICIALIZATION OF LEFT BRACHIALCEPHALIC AVF  SURGEON: Judeth Cornfield. Scot Dock, MD, FACS  ASSIST: Gerri Lins PA  ANESTHESIA: Local with sedation   EBL: Minimal  INDICATIONS:    Daniel Ochoa is a 76 y.o. male who presents for new access. The forearm cephalic vein was small and tortuous with multiple small branches. The upper arm cephalic vein looked much more reasonable in size although it was somewhat deep. This reason I elected to superficialize this.   FINDINGS:    4.5 mm upper arm cephalic vein  TECHNIQUE:    The patient was taken to the operating room and sedated by anesthesia. The left upper extremity was prepped and draped in usual sterile fashion. A transverse incision was made just above the antecubital level after the skin was anesthetized. The cephalic vein was dissected free down to below the antecubital level. It was ligated and divided and irrigated up nicely with heparinized saline. The brachial artery was dissected free beneath the fascia. Of note it was quite deep. The patient was heparinized. The brachial artery was clamped proximally and distally and a longitudinal arteriotomy was made. The vein was sewn end to side to the artery using continuous 6-0 Prolene suture. At the completion that was a good thrill in the fistula and a palpable radial pulse. Given that the vein was very deep I mobilized the adipose tissue overlying the vein and freely mobilized the vein through this incision as far as I could. I then made a separate incision after the skin was anesthetized in the mid upper arm. I continued this mobilization of the vein up to the mid upper arm. The adipose tissue overlying the vein was excised. Hemostasis was obtained in the  wound. The incision in the upper arm was closed with a 4-0 Vicryl. This was only closed in 1 layers of that the vein would be superficial. The antecubital incision was closed with a deep airfield Vicryl and the skin closed with 4-0 Vicryl. Dermabond was applied. The patient tolerated the procedure well and was transferred to the recovery room in stable condition. All needle and sponge counts were correct.  [name], MD, FACS [facility]  DATE OF DICTATION:   04/18/2017

## 2017-04-19 ENCOUNTER — Encounter (HOSPITAL_COMMUNITY): Payer: Self-pay | Admitting: Vascular Surgery

## 2017-04-19 ENCOUNTER — Telehealth: Payer: Self-pay | Admitting: Vascular Surgery

## 2017-04-19 NOTE — Telephone Encounter (Signed)
-----   Message from Mena Goes, RN sent at 04/18/2017  1:04 PM EDT ----- Regarding: 6 weeks w/ duplex   ----- Message ----- From: Ulyses Amor, PA-C Sent: 04/18/2017  12:49 PM To: Vvs Charge Pool  F/U with Dr,. Dickson in 6 weeks needs left av fistula duplex.  S/P left UE av fistula creation.

## 2017-04-19 NOTE — Telephone Encounter (Signed)
Sched lab 05/31/17 at 4:00 and MD 06/05/17 at 1:00. Spoke to pt's wife.

## 2017-04-23 DIAGNOSIS — Z1389 Encounter for screening for other disorder: Secondary | ICD-10-CM | POA: Diagnosis not present

## 2017-04-23 DIAGNOSIS — Z6841 Body Mass Index (BMI) 40.0 and over, adult: Secondary | ICD-10-CM | POA: Diagnosis not present

## 2017-04-23 DIAGNOSIS — M797 Fibromyalgia: Secondary | ICD-10-CM | POA: Diagnosis not present

## 2017-04-23 DIAGNOSIS — G4733 Obstructive sleep apnea (adult) (pediatric): Secondary | ICD-10-CM | POA: Diagnosis not present

## 2017-04-23 DIAGNOSIS — I872 Venous insufficiency (chronic) (peripheral): Secondary | ICD-10-CM | POA: Insufficient documentation

## 2017-04-23 DIAGNOSIS — N184 Chronic kidney disease, stage 4 (severe): Secondary | ICD-10-CM | POA: Diagnosis not present

## 2017-04-23 DIAGNOSIS — H359 Unspecified retinal disorder: Secondary | ICD-10-CM | POA: Insufficient documentation

## 2017-04-23 DIAGNOSIS — E039 Hypothyroidism, unspecified: Secondary | ICD-10-CM | POA: Insufficient documentation

## 2017-04-23 DIAGNOSIS — E038 Other specified hypothyroidism: Secondary | ICD-10-CM | POA: Diagnosis not present

## 2017-04-23 DIAGNOSIS — I10 Essential (primary) hypertension: Secondary | ICD-10-CM | POA: Diagnosis not present

## 2017-04-23 DIAGNOSIS — E1121 Type 2 diabetes mellitus with diabetic nephropathy: Secondary | ICD-10-CM | POA: Insufficient documentation

## 2017-04-23 DIAGNOSIS — M545 Low back pain: Secondary | ICD-10-CM | POA: Diagnosis not present

## 2017-04-23 DIAGNOSIS — E1129 Type 2 diabetes mellitus with other diabetic kidney complication: Secondary | ICD-10-CM | POA: Diagnosis not present

## 2017-04-23 DIAGNOSIS — M542 Cervicalgia: Secondary | ICD-10-CM | POA: Diagnosis not present

## 2017-04-23 DIAGNOSIS — F0280 Dementia in other diseases classified elsewhere without behavioral disturbance: Secondary | ICD-10-CM | POA: Insufficient documentation

## 2017-05-08 ENCOUNTER — Encounter: Payer: Self-pay | Admitting: Podiatry

## 2017-05-08 ENCOUNTER — Ambulatory Visit: Payer: Medicare Other | Admitting: Podiatry

## 2017-05-08 DIAGNOSIS — B351 Tinea unguium: Secondary | ICD-10-CM

## 2017-05-08 DIAGNOSIS — E1142 Type 2 diabetes mellitus with diabetic polyneuropathy: Secondary | ICD-10-CM

## 2017-05-08 NOTE — Progress Notes (Signed)
Patient ID: Daniel Ochoa, male   DOB: [birth year], 76 y.o.   MRN: [id number]

## 2017-05-08 NOTE — Patient Instructions (Signed)
Remove the Band-Aid in the fifth right toe and 1-3 days and apply topical antibiotic ointment and Band-Aid daily until a scab forms  Diabetes and Foot Care Diabetes may cause you to have problems because of poor blood supply (circulation) to your feet and legs. This may cause the skin on your feet to become thinner, break easier, and heal more slowly. Your skin may become dry, and the skin may peel and crack. You may also have nerve damage in your legs and feet causing decreased feeling in them. You may not notice minor injuries to your feet that could lead to infections or more serious problems. Taking care of your feet is one of the most important things you can do for yourself. Follow these instructions at home:  Wear shoes at all times, even in the house. Do not go barefoot. Bare feet are easily injured.  Check your feet daily for blisters, cuts, and redness. If you cannot see the bottom of your feet, use a mirror or ask someone for help.  Wash your feet with warm water (do not use hot water) and mild soap. Then pat your feet and the areas between your toes until they are completely dry. Do not soak your feet as this can dry your skin.  Apply a moisturizing lotion or petroleum jelly (that does not contain alcohol and is unscented) to the skin on your feet and to dry, brittle toenails. Do not apply lotion between your toes.  Trim your toenails straight across. Do not dig under them or around the cuticle. File the edges of your nails with an emery board or nail file.  Do not cut corns or calluses or try to remove them with medicine.  Wear clean socks or stockings every day. Make sure they are not too tight. Do not wear knee-high stockings since they may decrease blood flow to your legs.  Wear shoes that fit properly and have enough cushioning. To break in new shoes, wear them for just a few hours a day. This prevents you from injuring your feet. Always look in your shoes before you put them  on to be sure there are no objects inside.  Do not cross your legs. This may decrease the blood flow to your feet.  If you find a minor scrape, cut, or break in the skin on your feet, keep it and the skin around it clean and dry. These areas may be cleansed with mild soap and water. Do not cleanse the area with peroxide, alcohol, or iodine.  When you remove an adhesive bandage, be sure not to damage the skin around it.  If you have a wound, look at it several times a day to make sure it is healing.  Do not use heating pads or hot water bottles. They may burn your skin. If you have lost feeling in your feet or legs, you may not know it is happening until it is too late.  Make sure your health care provider performs a complete foot exam at least annually or more often if you have foot problems. Report any cuts, sores, or bruises to your health care provider immediately. Contact a health care provider if:  You have an injury that is not healing.  You have cuts or breaks in the skin.  You have an ingrown nail.  You notice redness on your legs or feet.  You feel burning or tingling in your legs or feet.  You have pain or cramps in your legs  and feet.  Your legs or feet are numb.  Your feet always feel cold. Get help right away if:  There is increasing redness, swelling, or pain in or around a wound.  There is a red line that goes up your leg.  Pus is coming from a wound.  You develop a fever or as directed by your health care provider.  You notice a bad smell coming from an ulcer or wound. This information is not intended to replace advice given to you by your health care provider. Make sure you discuss any questions you have with your health care provider. Document Released: 08/31/2000 Document Revised: 02/09/2016 Document Reviewed: 02/10/2013 Elsevier Interactive Patient Education  2017 [employer].

## 2017-05-08 NOTE — Progress Notes (Signed)
Patient ID: Daniel Ochoa, male   DOB: September 03, 1941, 76 y.o.   MRN: [id number]    Subjective: This patient presents today again complaining of thickened and elongated toenails and requests toenail debridement. The patient's wife is present in the treatment room today Patient has a history of diabetes with neuropathy Patient is also requesting diabetic shoes  Patient appears orientated 3 with wife present to treatment room Vascular: Peripheral pitting edema bilaterally DP and PT pulses 2/4 bilaterally Capillary reflex immediate bilaterally  Neurological: Sensation to monofilament wire intact 2/5 right and 3/5 left Vibratory sensation nonreactive bilaterally Ankle reflex reactive bilaterally  Dermatological: No open skin lesions bilaterally Minimal plantar callus bilaterally Atrophic skin with absent hair growth bilaterally The toenails are elongated, brittle, deformed, discolored and tender to direct palpation 6-10  Musculoskeletal: HAV deformities bilaterally Hammertoe 2-5 bilaterally  Assessment: Peripheral edema bilaterally Vascular status appears adequate Diabetic peripheral neuropathy Mycotic toenails 6-10   Plan: Debridement toenails 6-10 mechanically and left with slight bleeding treated with topical biotic ointment and Band-Aid N debridement plantar calluses 2 Obtain certification for diabetic shoes pending  Previously submitted and will resubmit Reappoint 3 months for skin a nail debridement

## 2017-05-14 DIAGNOSIS — D631 Anemia in chronic kidney disease: Secondary | ICD-10-CM | POA: Diagnosis not present

## 2017-05-14 DIAGNOSIS — N184 Chronic kidney disease, stage 4 (severe): Secondary | ICD-10-CM | POA: Diagnosis not present

## 2017-05-16 DIAGNOSIS — E1165 Type 2 diabetes mellitus with hyperglycemia: Secondary | ICD-10-CM | POA: Diagnosis not present

## 2017-05-16 DIAGNOSIS — I10 Essential (primary) hypertension: Secondary | ICD-10-CM | POA: Diagnosis not present

## 2017-05-16 DIAGNOSIS — E890 Postprocedural hypothyroidism: Secondary | ICD-10-CM | POA: Diagnosis not present

## 2017-05-16 DIAGNOSIS — N184 Chronic kidney disease, stage 4 (severe): Secondary | ICD-10-CM | POA: Diagnosis not present

## 2017-05-16 NOTE — Addendum Note (Signed)
Addended by: Lianne Cure A on: 05/16/2017 04:00 PM   Modules accepted: Orders

## 2017-05-17 DIAGNOSIS — Z6841 Body Mass Index (BMI) 40.0 and over, adult: Secondary | ICD-10-CM | POA: Diagnosis not present

## 2017-05-17 DIAGNOSIS — N184 Chronic kidney disease, stage 4 (severe): Secondary | ICD-10-CM | POA: Diagnosis not present

## 2017-05-17 DIAGNOSIS — I129 Hypertensive chronic kidney disease with stage 1 through stage 4 chronic kidney disease, or unspecified chronic kidney disease: Secondary | ICD-10-CM | POA: Diagnosis not present

## 2017-05-17 DIAGNOSIS — E1122 Type 2 diabetes mellitus with diabetic chronic kidney disease: Secondary | ICD-10-CM | POA: Diagnosis not present

## 2017-05-17 DIAGNOSIS — E669 Obesity, unspecified: Secondary | ICD-10-CM | POA: Diagnosis not present

## 2017-05-22 ENCOUNTER — Ambulatory Visit: Payer: Medicare Other

## 2017-05-31 ENCOUNTER — Ambulatory Visit (HOSPITAL_COMMUNITY)
Admission: RE | Admit: 2017-05-31 | Discharge: 2017-05-31 | Disposition: A | Discharge status: Home / Self Care | Payer: Medicare Other | Source: Ambulatory Visit | Attending: Vascular Surgery | Admitting: Vascular Surgery

## 2017-05-31 DIAGNOSIS — Z48812 Encounter for surgical aftercare following surgery on the circulatory system: Secondary | ICD-10-CM

## 2017-05-31 DIAGNOSIS — N184 Chronic kidney disease, stage 4 (severe): Secondary | ICD-10-CM | POA: Diagnosis present

## 2017-05-31 DIAGNOSIS — N185 Chronic kidney disease, stage 5: Secondary | ICD-10-CM | POA: Diagnosis present

## 2017-05-31 DIAGNOSIS — N2889 Other specified disorders of kidney and ureter: Secondary | ICD-10-CM

## 2017-06-05 ENCOUNTER — Encounter: Payer: Self-pay | Admitting: Vascular Surgery

## 2017-06-05 ENCOUNTER — Ambulatory Visit (INDEPENDENT_AMBULATORY_CARE_PROVIDER_SITE_OTHER): Payer: Self-pay | Admitting: Vascular Surgery

## 2017-06-05 VITALS — BP 134/53 | HR 65 | Temp 97.0°F | Resp 20 | Ht 69.0 in | Wt 306.0 lb

## 2017-06-05 DIAGNOSIS — Z48812 Encounter for surgical aftercare following surgery on the circulatory system: Secondary | ICD-10-CM

## 2017-06-05 DIAGNOSIS — N184 Chronic kidney disease, stage 4 (severe): Secondary | ICD-10-CM

## 2017-06-05 DIAGNOSIS — N2889 Other specified disorders of kidney and ureter: Secondary | ICD-10-CM

## 2017-06-05 NOTE — Progress Notes (Signed)
   Patient name: Daniel Ochoa MRN: [id number] DOB: Nov 13, 1940 Sex: male  REASON FOR VISIT:    Follow up after left AV fistula  HPI:   Daniel Ochoa is a pleasant 76 y.o. male with stage IV chronic kidney disease. He underwent placement of a left brachiocephalic AV fistula and also superficialization of the fistula on 04/18/17.  He comes in for a 6 week follow up visit.  He has no specific complaints. Specifically he denies pain or paresthesias in the left arm.  Current Outpatient Prescriptions  Medication Sig Dispense Refill  . amLODipine (NORVASC) 5 MG tablet Take 5 mg by mouth daily.     . Ascorbic Acid (VITAMIN C) 1000 MG tablet Take 1,000 mg by mouth daily.    Marland Kitchen atenolol (TENORMIN) 50 MG tablet Take 50 mg by mouth 2 (two) times daily.     . Calcium Carb-Cholecalciferol (CALCIUM 600 + D PO) Take 1 tablet by mouth 2 (two) times daily.    . clonazePAM (KLONOPIN) 1 MG tablet Take 1 mg by mouth at bedtime.     . donepezil (ARICEPT) 5 MG tablet Take 5 mg by mouth at bedtime.     . DYMISTA 137-50 MCG/ACT SUSP Place 1 spray into both nostrils 2 (two) times daily.     . famotidine (PEPCID) 40 MG tablet Take 40 mg by mouth at bedtime.     . furosemide (LASIX) 40 MG tablet Take 40 mg by mouth 3 (three) times a week.     Marland Kitchen HYDROcodone-acetaminophen (NORCO/VICODIN) 5-325 MG per tablet Take 1 tablet by mouth every 6 (six) hours as needed for moderate pain.     Marland Kitchen insulin regular human CONCENTRATED (HUMULIN R) 500 UNIT/ML injection Inject into the skin. Pt uses with Insulin pump    . nystatin (MYCOSTATIN) 500000 units TABS tablet Take 1 tablet by mouth daily as needed. Uses for heat rash    . oxyCODONE-acetaminophen (PERCOCET/ROXICET) 5-325 MG tablet Take 1 tablet by mouth every 6 (six) hours as needed. 15 tablet 0  . SYNTHROID 200 MCG tablet Take 200 mcg by mouth See admin instructions. Takes the 200 mcg daily except on Sundays, pt takes 300 mcg (or 1.5 tablets)     No current  facility-administered medications for this visit.     REVIEW OF SYSTEMS:  [X]  denotes positive finding, [ ]  denotes negative finding Cardiac  Comments:  Chest pain or chest pressure:    Shortness of breath upon exertion:    Short of breath when lying flat:    Irregular heart rhythm:    Constitutional    Fever or chills:     PHYSICAL EXAM:   There were no vitals filed for this visit.  GENERAL: The patient is a well-nourished male, in no acute distress. The vital signs are documented above. CARDIOVASCULAR: There is a regular rate and rhythm. PULMONARY: There is good air exchange bilaterally without wheezing or rales. His incisions are healing nicely. He has an excellent thrill in his fistula. The left hand is warm and well-perfused.  DATA:   DUPLEX: Duplex of his AV fistula on 05/31/2017 shows that the diameters of the fistula range from 0.8-1.11 cm.  MEDICAL ISSUES:   STATUS POST CREATION OF LEFT BRACHIOCEPHALIC AV FISTULA: The fistula to be maturing nicely. It should be ready for access in 6 weeks if it is needed. I'll see him back as needed.  [name] [facility] [phone]

## 2017-07-17 DIAGNOSIS — N184 Chronic kidney disease, stage 4 (severe): Secondary | ICD-10-CM | POA: Diagnosis not present

## 2017-07-17 DIAGNOSIS — D631 Anemia in chronic kidney disease: Secondary | ICD-10-CM | POA: Diagnosis not present

## 2017-07-23 DIAGNOSIS — E1122 Type 2 diabetes mellitus with diabetic chronic kidney disease: Secondary | ICD-10-CM | POA: Diagnosis not present

## 2017-07-23 DIAGNOSIS — Z6841 Body Mass Index (BMI) 40.0 and over, adult: Secondary | ICD-10-CM | POA: Diagnosis not present

## 2017-07-23 DIAGNOSIS — I129 Hypertensive chronic kidney disease with stage 1 through stage 4 chronic kidney disease, or unspecified chronic kidney disease: Secondary | ICD-10-CM | POA: Diagnosis not present

## 2017-07-23 DIAGNOSIS — N184 Chronic kidney disease, stage 4 (severe): Secondary | ICD-10-CM | POA: Diagnosis not present

## 2017-07-23 DIAGNOSIS — Z23 Encounter for immunization: Secondary | ICD-10-CM | POA: Diagnosis not present

## 2017-07-29 ENCOUNTER — Telehealth: Payer: Self-pay | Admitting: Podiatry

## 2017-07-29 NOTE — Telephone Encounter (Signed)
Per Joneen Caraway from [facility] pts shoes are on back order until 12.20.18.  I called pt and patient did not want a different color he wants to wait until 12.20.18 and if I get them in sooner I will call pt.

## 2017-08-01 DIAGNOSIS — Z9641 Presence of insulin pump (external) (internal): Secondary | ICD-10-CM | POA: Diagnosis not present

## 2017-08-01 DIAGNOSIS — E1165 Type 2 diabetes mellitus with hyperglycemia: Secondary | ICD-10-CM | POA: Diagnosis not present

## 2017-08-05 ENCOUNTER — Encounter: Payer: Self-pay | Admitting: Podiatry

## 2017-08-05 ENCOUNTER — Ambulatory Visit (INDEPENDENT_AMBULATORY_CARE_PROVIDER_SITE_OTHER): Payer: Medicare Other | Admitting: Podiatry

## 2017-08-05 DIAGNOSIS — E1142 Type 2 diabetes mellitus with diabetic polyneuropathy: Secondary | ICD-10-CM

## 2017-08-05 DIAGNOSIS — M79676 Pain in unspecified toe(s): Secondary | ICD-10-CM

## 2017-08-05 DIAGNOSIS — B351 Tinea unguium: Secondary | ICD-10-CM | POA: Diagnosis not present

## 2017-08-05 NOTE — Progress Notes (Signed)
Patient ID: Daniel Ochoa, male   DOB: [birth year], 76 y.o.   MRN: [id number]     Subjective: This patient presents today again complaining of thickened and elongated toenails and requests toenail debridement. The patient's wife is present in the treatment room today Patient has a history of diabetes with neuropathy Patient is also requesting diabetic shoes  Patient appears orientated 3 with wife present to treatment room Vascular: Peripheral pitting edema bilaterally DP and PT pulses 2/4 bilaterally Capillary reflex immediate bilaterally  Neurological: Sensation to monofilament wire intact 2/5 right and 3/5 left Vibratory sensation nonreactive bilaterally Ankle reflex reactive bilaterally  Dermatological: No open skin lesions bilaterally Minimal plantar callus bilaterally Atrophic skin with absent hair growth bilaterally The toenails are elongated, brittle, deformed, discolored and tender to direct palpation 6-10  Musculoskeletal: HAV deformities bilaterally Hammertoe 2-5 bilaterally  Assessment: Peripheral edema bilaterally Vascular status appears adequate Diabetic peripheral neuropathy Mycotic toenails 6-10   Plan: Debridement toenails 6-10 mechanically and left with slight bleeding fourth left toe, treated with topical biotic ointment and Band-Aid N  debridement plantar calluses 2 Obtain certification for diabetic shoes pending  Previously submitted and will resubmit Reappoint 3 months for skin a nail debridement

## 2017-08-05 NOTE — Patient Instructions (Signed)
Removed Band-Aid and fourth left toe 1-3 days apply topical antibiotic ointment and Band-Aid daily until a scab forms  Diabetes and Foot Care Diabetes may cause you to have problems because of poor blood supply (circulation) to your feet and legs. This may cause the skin on your feet to become thinner, break easier, and heal more slowly. Your skin may become dry, and the skin may peel and crack. You may also have nerve damage in your legs and feet causing decreased feeling in them. You may not notice minor injuries to your feet that could lead to infections or more serious problems. Taking care of your feet is one of the most important things you can do for yourself. Follow these instructions at home:  Wear shoes at all times, even in the house. Do not go barefoot. Bare feet are easily injured.  Check your feet daily for blisters, cuts, and redness. If you cannot see the bottom of your feet, use a mirror or ask someone for help.  Wash your feet with warm water (do not use hot water) and mild soap. Then pat your feet and the areas between your toes until they are completely dry. Do not soak your feet as this can dry your skin.  Apply a moisturizing lotion or petroleum jelly (that does not contain alcohol and is unscented) to the skin on your feet and to dry, brittle toenails. Do not apply lotion between your toes.  Trim your toenails straight across. Do not dig under them or around the cuticle. File the edges of your nails with an emery board or nail file.  Do not cut corns or calluses or try to remove them with medicine.  Wear clean socks or stockings every day. Make sure they are not too tight. Do not wear knee-high stockings since they may decrease blood flow to your legs.  Wear shoes that fit properly and have enough cushioning. To break in new shoes, wear them for just a few hours a day. This prevents you from injuring your feet. Always look in your shoes before you put them on to be sure  there are no objects inside.  Do not cross your legs. This may decrease the blood flow to your feet.  If you find a minor scrape, cut, or break in the skin on your feet, keep it and the skin around it clean and dry. These areas may be cleansed with mild soap and water. Do not cleanse the area with peroxide, alcohol, or iodine.  When you remove an adhesive bandage, be sure not to damage the skin around it.  If you have a wound, look at it several times a day to make sure it is healing.  Do not use heating pads or hot water bottles. They may burn your skin. If you have lost feeling in your feet or legs, you may not know it is happening until it is too late.  Make sure your health care provider performs a complete foot exam at least annually or more often if you have foot problems. Report any cuts, sores, or bruises to your health care provider immediately. Contact a health care provider if:  You have an injury that is not healing.  You have cuts or breaks in the skin.  You have an ingrown nail.  You notice redness on your legs or feet.  You feel burning or tingling in your legs or feet.  You have pain or cramps in your legs and feet.  Your  legs or feet are numb.  Your feet always feel cold. Get help right away if:  There is increasing redness, swelling, or pain in or around a wound.  There is a red line that goes up your leg.  Pus is coming from a wound.  You develop a fever or as directed by your health care provider.  You notice a bad smell coming from an ulcer or wound. This information is not intended to replace advice given to you by your health care provider. Make sure you discuss any questions you have with your health care provider. Document Released: 08/31/2000 Document Revised: 02/09/2016 Document Reviewed: 02/10/2013 Elsevier Interactive Patient Education  2017 [employer].

## 2017-08-15 DIAGNOSIS — N184 Chronic kidney disease, stage 4 (severe): Secondary | ICD-10-CM | POA: Diagnosis not present

## 2017-08-15 DIAGNOSIS — Z9641 Presence of insulin pump (external) (internal): Secondary | ICD-10-CM | POA: Diagnosis not present

## 2017-08-15 DIAGNOSIS — E1165 Type 2 diabetes mellitus with hyperglycemia: Secondary | ICD-10-CM | POA: Diagnosis not present

## 2017-08-15 DIAGNOSIS — E890 Postprocedural hypothyroidism: Secondary | ICD-10-CM | POA: Diagnosis not present

## 2017-08-15 DIAGNOSIS — I10 Essential (primary) hypertension: Secondary | ICD-10-CM | POA: Diagnosis not present

## 2017-08-16 DIAGNOSIS — H2513 Age-related nuclear cataract, bilateral: Secondary | ICD-10-CM | POA: Diagnosis not present

## 2017-08-22 DIAGNOSIS — E1129 Type 2 diabetes mellitus with other diabetic kidney complication: Secondary | ICD-10-CM | POA: Diagnosis not present

## 2017-08-22 DIAGNOSIS — I10 Essential (primary) hypertension: Secondary | ICD-10-CM | POA: Diagnosis not present

## 2017-08-22 DIAGNOSIS — I872 Venous insufficiency (chronic) (peripheral): Secondary | ICD-10-CM | POA: Diagnosis not present

## 2017-08-22 DIAGNOSIS — N184 Chronic kidney disease, stage 4 (severe): Secondary | ICD-10-CM | POA: Diagnosis not present

## 2017-08-22 DIAGNOSIS — M545 Low back pain: Secondary | ICD-10-CM | POA: Diagnosis not present

## 2017-08-22 DIAGNOSIS — G4733 Obstructive sleep apnea (adult) (pediatric): Secondary | ICD-10-CM | POA: Diagnosis not present

## 2017-08-22 DIAGNOSIS — Z6841 Body Mass Index (BMI) 40.0 and over, adult: Secondary | ICD-10-CM | POA: Diagnosis not present

## 2017-08-22 DIAGNOSIS — E038 Other specified hypothyroidism: Secondary | ICD-10-CM | POA: Diagnosis not present

## 2017-08-27 ENCOUNTER — Ambulatory Visit: Payer: Medicare Other | Admitting: Orthotics

## 2017-08-27 DIAGNOSIS — E1142 Type 2 diabetes mellitus with diabetic polyneuropathy: Secondary | ICD-10-CM

## 2017-08-28 NOTE — Progress Notes (Signed)
Patient was not satisfied with style or fit of shoes; re-ordered a [place] style boot instead 13 xw

## 2017-09-05 ENCOUNTER — Ambulatory Visit (INDEPENDENT_AMBULATORY_CARE_PROVIDER_SITE_OTHER): Payer: Medicare Other | Admitting: Orthotics

## 2017-09-05 DIAGNOSIS — M2042 Other hammer toe(s) (acquired), left foot: Secondary | ICD-10-CM | POA: Diagnosis not present

## 2017-09-05 DIAGNOSIS — E1165 Type 2 diabetes mellitus with hyperglycemia: Secondary | ICD-10-CM

## 2017-09-05 DIAGNOSIS — E1129 Type 2 diabetes mellitus with other diabetic kidney complication: Secondary | ICD-10-CM

## 2017-09-05 DIAGNOSIS — M2041 Other hammer toe(s) (acquired), right foot: Secondary | ICD-10-CM

## 2017-09-05 DIAGNOSIS — I82441 Acute embolism and thrombosis of right tibial vein: Secondary | ICD-10-CM

## 2017-09-05 DIAGNOSIS — E1142 Type 2 diabetes mellitus with diabetic polyneuropathy: Secondary | ICD-10-CM | POA: Diagnosis not present

## 2017-09-05 DIAGNOSIS — M2011 Hallux valgus (acquired), right foot: Secondary | ICD-10-CM | POA: Diagnosis not present

## 2017-09-05 DIAGNOSIS — IMO0002 Reserved for concepts with insufficient information to code with codable children: Secondary | ICD-10-CM

## 2017-09-05 DIAGNOSIS — M2012 Hallux valgus (acquired), left foot: Secondary | ICD-10-CM

## 2017-09-05 NOTE — Progress Notes (Signed)

## 2017-09-19 ENCOUNTER — Other Ambulatory Visit: Payer: Medicare Other | Admitting: Orthotics

## 2017-09-23 DIAGNOSIS — H1859 Other hereditary corneal dystrophies: Secondary | ICD-10-CM | POA: Diagnosis not present

## 2017-11-04 ENCOUNTER — Encounter: Payer: Self-pay | Admitting: Podiatry

## 2017-11-04 ENCOUNTER — Ambulatory Visit (INDEPENDENT_AMBULATORY_CARE_PROVIDER_SITE_OTHER): Payer: Medicare Other | Admitting: Podiatry

## 2017-11-04 DIAGNOSIS — E1142 Type 2 diabetes mellitus with diabetic polyneuropathy: Secondary | ICD-10-CM

## 2017-11-04 DIAGNOSIS — M79675 Pain in left toe(s): Secondary | ICD-10-CM | POA: Diagnosis not present

## 2017-11-04 DIAGNOSIS — B351 Tinea unguium: Secondary | ICD-10-CM

## 2017-11-04 DIAGNOSIS — M79674 Pain in right toe(s): Secondary | ICD-10-CM

## 2017-11-04 DIAGNOSIS — L84 Corns and callosities: Secondary | ICD-10-CM

## 2017-11-04 NOTE — Progress Notes (Signed)
Subjective:   Patient ID: Daniel Ochoa, male   DOB: 77 y.o.   MRN: [id number]   HPI Poorly controlled diabetic with nail disease 1-5 both feet to get incurvated and lesions underneath the third metatarsal of both feet that are painful   ROS      Objective:  Physical Exam  Neurovascular status found to be unchanged with thick yellow brittle nailbeds 1-5 both feet and lesions underneath the third metatarsal both feet that are painful     Assessment:  Chronic nail infection with pain and lesion formation with at risk diabetic with neurological loss     Plan:  Debrided nailbeds 1-5 both feet with no iatrogenic bleeding and lesions bilateral with no iatrogenic bleeding noted

## 2017-11-11 DIAGNOSIS — D631 Anemia in chronic kidney disease: Secondary | ICD-10-CM | POA: Diagnosis not present

## 2017-11-11 DIAGNOSIS — N184 Chronic kidney disease, stage 4 (severe): Secondary | ICD-10-CM | POA: Diagnosis not present

## 2017-11-13 DIAGNOSIS — H903 Sensorineural hearing loss, bilateral: Secondary | ICD-10-CM | POA: Diagnosis not present

## 2017-11-13 DIAGNOSIS — H838X3 Other specified diseases of inner ear, bilateral: Secondary | ICD-10-CM | POA: Diagnosis not present

## 2017-11-13 DIAGNOSIS — H6123 Impacted cerumen, bilateral: Secondary | ICD-10-CM | POA: Diagnosis not present

## 2017-11-14 DIAGNOSIS — N184 Chronic kidney disease, stage 4 (severe): Secondary | ICD-10-CM | POA: Diagnosis not present

## 2017-11-14 DIAGNOSIS — I129 Hypertensive chronic kidney disease with stage 1 through stage 4 chronic kidney disease, or unspecified chronic kidney disease: Secondary | ICD-10-CM | POA: Diagnosis not present

## 2017-11-14 DIAGNOSIS — Z6841 Body Mass Index (BMI) 40.0 and over, adult: Secondary | ICD-10-CM | POA: Diagnosis not present

## 2017-11-14 DIAGNOSIS — E1122 Type 2 diabetes mellitus with diabetic chronic kidney disease: Secondary | ICD-10-CM | POA: Diagnosis not present

## 2017-11-19 DIAGNOSIS — H2513 Age-related nuclear cataract, bilateral: Secondary | ICD-10-CM | POA: Diagnosis not present

## 2017-12-11 DIAGNOSIS — H2511 Age-related nuclear cataract, right eye: Secondary | ICD-10-CM | POA: Diagnosis not present

## 2017-12-11 DIAGNOSIS — H25811 Combined forms of age-related cataract, right eye: Secondary | ICD-10-CM | POA: Diagnosis not present

## 2017-12-13 DIAGNOSIS — N184 Chronic kidney disease, stage 4 (severe): Secondary | ICD-10-CM | POA: Diagnosis not present

## 2017-12-13 DIAGNOSIS — I10 Essential (primary) hypertension: Secondary | ICD-10-CM | POA: Diagnosis not present

## 2017-12-13 DIAGNOSIS — E890 Postprocedural hypothyroidism: Secondary | ICD-10-CM | POA: Diagnosis not present

## 2017-12-13 DIAGNOSIS — E1165 Type 2 diabetes mellitus with hyperglycemia: Secondary | ICD-10-CM | POA: Diagnosis not present

## 2017-12-13 DIAGNOSIS — Z9641 Presence of insulin pump (external) (internal): Secondary | ICD-10-CM | POA: Diagnosis not present

## 2018-01-07 DIAGNOSIS — Z0000 Encounter for general adult medical examination without abnormal findings: Secondary | ICD-10-CM | POA: Insufficient documentation

## 2018-01-07 DIAGNOSIS — E1129 Type 2 diabetes mellitus with other diabetic kidney complication: Secondary | ICD-10-CM | POA: Diagnosis not present

## 2018-01-07 DIAGNOSIS — R82998 Other abnormal findings in urine: Secondary | ICD-10-CM | POA: Diagnosis not present

## 2018-01-07 DIAGNOSIS — Z125 Encounter for screening for malignant neoplasm of prostate: Secondary | ICD-10-CM | POA: Diagnosis not present

## 2018-01-07 DIAGNOSIS — I10 Essential (primary) hypertension: Secondary | ICD-10-CM | POA: Diagnosis not present

## 2018-01-07 DIAGNOSIS — N184 Chronic kidney disease, stage 4 (severe): Secondary | ICD-10-CM | POA: Diagnosis not present

## 2018-01-07 DIAGNOSIS — E038 Other specified hypothyroidism: Secondary | ICD-10-CM | POA: Diagnosis not present

## 2018-01-14 DIAGNOSIS — E038 Other specified hypothyroidism: Secondary | ICD-10-CM | POA: Diagnosis not present

## 2018-01-14 DIAGNOSIS — I10 Essential (primary) hypertension: Secondary | ICD-10-CM | POA: Diagnosis not present

## 2018-01-14 DIAGNOSIS — Z1389 Encounter for screening for other disorder: Secondary | ICD-10-CM | POA: Diagnosis not present

## 2018-01-14 DIAGNOSIS — N184 Chronic kidney disease, stage 4 (severe): Secondary | ICD-10-CM | POA: Diagnosis not present

## 2018-01-14 DIAGNOSIS — Z6841 Body Mass Index (BMI) 40.0 and over, adult: Secondary | ICD-10-CM | POA: Diagnosis not present

## 2018-01-14 DIAGNOSIS — E1129 Type 2 diabetes mellitus with other diabetic kidney complication: Secondary | ICD-10-CM | POA: Diagnosis not present

## 2018-01-14 DIAGNOSIS — Z0000 Encounter for general adult medical examination without abnormal findings: Secondary | ICD-10-CM | POA: Diagnosis not present

## 2018-01-14 DIAGNOSIS — M545 Low back pain: Secondary | ICD-10-CM | POA: Diagnosis not present

## 2018-01-14 DIAGNOSIS — G4733 Obstructive sleep apnea (adult) (pediatric): Secondary | ICD-10-CM | POA: Diagnosis not present

## 2018-01-14 DIAGNOSIS — I872 Venous insufficiency (chronic) (peripheral): Secondary | ICD-10-CM | POA: Diagnosis not present

## 2018-01-17 DIAGNOSIS — Z1212 Encounter for screening for malignant neoplasm of rectum: Secondary | ICD-10-CM | POA: Diagnosis not present

## 2018-01-23 DIAGNOSIS — I10 Essential (primary) hypertension: Secondary | ICD-10-CM | POA: Diagnosis not present

## 2018-01-23 DIAGNOSIS — Z6841 Body Mass Index (BMI) 40.0 and over, adult: Secondary | ICD-10-CM | POA: Diagnosis not present

## 2018-01-23 DIAGNOSIS — Z794 Long term (current) use of insulin: Secondary | ICD-10-CM | POA: Insufficient documentation

## 2018-01-23 DIAGNOSIS — Z4681 Encounter for fitting and adjustment of insulin pump: Secondary | ICD-10-CM | POA: Diagnosis not present

## 2018-01-23 DIAGNOSIS — N184 Chronic kidney disease, stage 4 (severe): Secondary | ICD-10-CM | POA: Diagnosis not present

## 2018-01-23 DIAGNOSIS — E1129 Type 2 diabetes mellitus with other diabetic kidney complication: Secondary | ICD-10-CM | POA: Diagnosis not present

## 2018-02-03 ENCOUNTER — Ambulatory Visit (INDEPENDENT_AMBULATORY_CARE_PROVIDER_SITE_OTHER): Payer: Medicare Other | Admitting: Podiatry

## 2018-02-03 DIAGNOSIS — M79674 Pain in right toe(s): Secondary | ICD-10-CM

## 2018-02-03 DIAGNOSIS — M79675 Pain in left toe(s): Secondary | ICD-10-CM | POA: Diagnosis not present

## 2018-02-03 DIAGNOSIS — N189 Chronic kidney disease, unspecified: Secondary | ICD-10-CM | POA: Diagnosis not present

## 2018-02-03 DIAGNOSIS — E1165 Type 2 diabetes mellitus with hyperglycemia: Secondary | ICD-10-CM

## 2018-02-03 DIAGNOSIS — E1129 Type 2 diabetes mellitus with other diabetic kidney complication: Secondary | ICD-10-CM

## 2018-02-03 DIAGNOSIS — E1142 Type 2 diabetes mellitus with diabetic polyneuropathy: Secondary | ICD-10-CM

## 2018-02-03 DIAGNOSIS — M79676 Pain in unspecified toe(s): Secondary | ICD-10-CM

## 2018-02-03 DIAGNOSIS — IMO0002 Reserved for concepts with insufficient information to code with codable children: Secondary | ICD-10-CM

## 2018-02-03 DIAGNOSIS — B351 Tinea unguium: Secondary | ICD-10-CM | POA: Diagnosis not present

## 2018-02-03 DIAGNOSIS — N184 Chronic kidney disease, stage 4 (severe): Secondary | ICD-10-CM | POA: Diagnosis not present

## 2018-02-05 NOTE — Progress Notes (Signed)
Subjective:   Patient ID: Daniel Ochoa, male   DOB: 77 y.o.   MRN: [id number]   HPI Patient presents with thick yellow brittle nailbeds 1-5 both feet that he cannot cut and are painful   ROS      Objective:  Physical Exam  Neurovascular status unchanged with thick yellow brittle nailbeds noted 1-5 both feet that are painful when palpated     Assessment:  Mycotic nail infection 1-5 both feet with pain     Plan:  Debride painful nailbeds 1-5 both feet with no iatrogenic bleeding noted

## 2018-02-11 DIAGNOSIS — E1122 Type 2 diabetes mellitus with diabetic chronic kidney disease: Secondary | ICD-10-CM | POA: Diagnosis not present

## 2018-02-11 DIAGNOSIS — I129 Hypertensive chronic kidney disease with stage 1 through stage 4 chronic kidney disease, or unspecified chronic kidney disease: Secondary | ICD-10-CM | POA: Diagnosis not present

## 2018-02-11 DIAGNOSIS — Z6841 Body Mass Index (BMI) 40.0 and over, adult: Secondary | ICD-10-CM | POA: Diagnosis not present

## 2018-02-11 DIAGNOSIS — N184 Chronic kidney disease, stage 4 (severe): Secondary | ICD-10-CM | POA: Diagnosis not present

## 2018-02-12 DIAGNOSIS — E1165 Type 2 diabetes mellitus with hyperglycemia: Secondary | ICD-10-CM | POA: Diagnosis not present

## 2018-02-18 DIAGNOSIS — W19XXXA Unspecified fall, initial encounter: Secondary | ICD-10-CM | POA: Diagnosis not present

## 2018-02-18 DIAGNOSIS — S064X0A Epidural hemorrhage without loss of consciousness, initial encounter: Secondary | ICD-10-CM | POA: Diagnosis not present

## 2018-02-20 DIAGNOSIS — E1129 Type 2 diabetes mellitus with other diabetic kidney complication: Secondary | ICD-10-CM | POA: Diagnosis not present

## 2018-02-24 DIAGNOSIS — G4733 Obstructive sleep apnea (adult) (pediatric): Secondary | ICD-10-CM | POA: Diagnosis not present

## 2018-03-06 DIAGNOSIS — I10 Essential (primary) hypertension: Secondary | ICD-10-CM | POA: Diagnosis not present

## 2018-03-06 DIAGNOSIS — W19XXXA Unspecified fall, initial encounter: Secondary | ICD-10-CM | POA: Diagnosis not present

## 2018-03-06 DIAGNOSIS — E1129 Type 2 diabetes mellitus with other diabetic kidney complication: Secondary | ICD-10-CM | POA: Diagnosis not present

## 2018-03-06 DIAGNOSIS — Z6841 Body Mass Index (BMI) 40.0 and over, adult: Secondary | ICD-10-CM | POA: Diagnosis not present

## 2018-03-06 DIAGNOSIS — Z4681 Encounter for fitting and adjustment of insulin pump: Secondary | ICD-10-CM | POA: Diagnosis not present

## 2018-03-06 DIAGNOSIS — Z794 Long term (current) use of insulin: Secondary | ICD-10-CM | POA: Diagnosis not present

## 2018-03-06 DIAGNOSIS — N184 Chronic kidney disease, stage 4 (severe): Secondary | ICD-10-CM | POA: Diagnosis not present

## 2018-04-03 DIAGNOSIS — H04123 Dry eye syndrome of bilateral lacrimal glands: Secondary | ICD-10-CM | POA: Diagnosis not present

## 2018-04-25 DIAGNOSIS — E1129 Type 2 diabetes mellitus with other diabetic kidney complication: Secondary | ICD-10-CM | POA: Diagnosis not present

## 2018-04-25 DIAGNOSIS — M545 Low back pain: Secondary | ICD-10-CM | POA: Diagnosis not present

## 2018-04-25 DIAGNOSIS — E038 Other specified hypothyroidism: Secondary | ICD-10-CM | POA: Diagnosis not present

## 2018-04-25 DIAGNOSIS — I10 Essential (primary) hypertension: Secondary | ICD-10-CM | POA: Diagnosis not present

## 2018-04-25 DIAGNOSIS — N184 Chronic kidney disease, stage 4 (severe): Secondary | ICD-10-CM | POA: Diagnosis not present

## 2018-04-25 DIAGNOSIS — G4733 Obstructive sleep apnea (adult) (pediatric): Secondary | ICD-10-CM | POA: Diagnosis not present

## 2018-04-25 DIAGNOSIS — Z794 Long term (current) use of insulin: Secondary | ICD-10-CM | POA: Diagnosis not present

## 2018-04-25 DIAGNOSIS — Z6841 Body Mass Index (BMI) 40.0 and over, adult: Secondary | ICD-10-CM | POA: Diagnosis not present

## 2018-04-25 DIAGNOSIS — G309 Alzheimer's disease, unspecified: Secondary | ICD-10-CM | POA: Diagnosis not present

## 2018-05-07 ENCOUNTER — Ambulatory Visit (INDEPENDENT_AMBULATORY_CARE_PROVIDER_SITE_OTHER): Payer: Medicare Other | Admitting: Podiatry

## 2018-05-07 ENCOUNTER — Encounter: Payer: Self-pay | Admitting: Podiatry

## 2018-05-07 DIAGNOSIS — L84 Corns and callosities: Secondary | ICD-10-CM

## 2018-05-07 DIAGNOSIS — E1142 Type 2 diabetes mellitus with diabetic polyneuropathy: Secondary | ICD-10-CM | POA: Diagnosis not present

## 2018-05-07 DIAGNOSIS — M79676 Pain in unspecified toe(s): Secondary | ICD-10-CM | POA: Diagnosis not present

## 2018-05-07 DIAGNOSIS — B351 Tinea unguium: Secondary | ICD-10-CM | POA: Diagnosis not present

## 2018-05-07 DIAGNOSIS — N184 Chronic kidney disease, stage 4 (severe): Secondary | ICD-10-CM | POA: Diagnosis not present

## 2018-05-07 DIAGNOSIS — N189 Chronic kidney disease, unspecified: Secondary | ICD-10-CM | POA: Diagnosis not present

## 2018-05-07 NOTE — Progress Notes (Signed)
Subjective:   Patient ID: Daniel Ochoa, male   DOB: 77 y.o.   MRN: [id number]   HPI Patient presents with nail disease 1-5 both feet that are irritated and lesions underneath both feet that are bothersome and make walking difficult.  Patient has long-term diabetes with neuropathic changes   ROS      Objective:  Physical Exam  Neurovascular status unchanged from previous visit with thick yellow brittle nailbeds 1-5 both feet and lesions underneath the second metatarsal that are painful     Assessment:  Mycotic nail infection with pain 1-5 both feet and lesion formation bilateral with at risk patient     Plan:  Sterile debridement of lesions and nailbeds accomplished today with no iatrogenic bleeding and reappoint for routine care or earlier if any issues should occur signed visit

## 2018-05-15 DIAGNOSIS — E1122 Type 2 diabetes mellitus with diabetic chronic kidney disease: Secondary | ICD-10-CM | POA: Diagnosis not present

## 2018-05-15 DIAGNOSIS — N184 Chronic kidney disease, stage 4 (severe): Secondary | ICD-10-CM | POA: Diagnosis not present

## 2018-05-15 DIAGNOSIS — Z6841 Body Mass Index (BMI) 40.0 and over, adult: Secondary | ICD-10-CM | POA: Diagnosis not present

## 2018-05-15 DIAGNOSIS — I129 Hypertensive chronic kidney disease with stage 1 through stage 4 chronic kidney disease, or unspecified chronic kidney disease: Secondary | ICD-10-CM | POA: Diagnosis not present

## 2018-06-10 DIAGNOSIS — Z794 Long term (current) use of insulin: Secondary | ICD-10-CM | POA: Diagnosis not present

## 2018-06-10 DIAGNOSIS — Z4681 Encounter for fitting and adjustment of insulin pump: Secondary | ICD-10-CM | POA: Diagnosis not present

## 2018-06-10 DIAGNOSIS — E1129 Type 2 diabetes mellitus with other diabetic kidney complication: Secondary | ICD-10-CM | POA: Diagnosis not present

## 2018-06-10 DIAGNOSIS — I10 Essential (primary) hypertension: Secondary | ICD-10-CM | POA: Diagnosis not present

## 2018-06-10 DIAGNOSIS — N184 Chronic kidney disease, stage 4 (severe): Secondary | ICD-10-CM | POA: Diagnosis not present

## 2018-06-10 DIAGNOSIS — Z23 Encounter for immunization: Secondary | ICD-10-CM | POA: Diagnosis not present

## 2018-06-24 DIAGNOSIS — Z6838 Body mass index (BMI) 38.0-38.9, adult: Secondary | ICD-10-CM | POA: Diagnosis not present

## 2018-06-24 DIAGNOSIS — L8990 Pressure ulcer of unspecified site, unspecified stage: Secondary | ICD-10-CM | POA: Diagnosis not present

## 2018-06-27 DIAGNOSIS — Z6838 Body mass index (BMI) 38.0-38.9, adult: Secondary | ICD-10-CM | POA: Diagnosis not present

## 2018-06-27 DIAGNOSIS — L89153 Pressure ulcer of sacral region, stage 3: Secondary | ICD-10-CM | POA: Diagnosis not present

## 2018-07-04 DIAGNOSIS — Z6839 Body mass index (BMI) 39.0-39.9, adult: Secondary | ICD-10-CM | POA: Diagnosis not present

## 2018-07-04 DIAGNOSIS — L89153 Pressure ulcer of sacral region, stage 3: Secondary | ICD-10-CM | POA: Diagnosis not present

## 2018-07-11 DIAGNOSIS — Z6838 Body mass index (BMI) 38.0-38.9, adult: Secondary | ICD-10-CM | POA: Diagnosis not present

## 2018-07-11 DIAGNOSIS — L89153 Pressure ulcer of sacral region, stage 3: Secondary | ICD-10-CM | POA: Diagnosis not present

## 2018-08-07 ENCOUNTER — Ambulatory Visit (INDEPENDENT_AMBULATORY_CARE_PROVIDER_SITE_OTHER): Payer: Medicare Other | Admitting: Podiatry

## 2018-08-07 DIAGNOSIS — B351 Tinea unguium: Secondary | ICD-10-CM | POA: Diagnosis not present

## 2018-08-07 DIAGNOSIS — M79674 Pain in right toe(s): Secondary | ICD-10-CM | POA: Diagnosis not present

## 2018-08-07 DIAGNOSIS — M79675 Pain in left toe(s): Secondary | ICD-10-CM | POA: Diagnosis not present

## 2018-08-07 DIAGNOSIS — N184 Chronic kidney disease, stage 4 (severe): Secondary | ICD-10-CM | POA: Diagnosis not present

## 2018-08-07 DIAGNOSIS — E1142 Type 2 diabetes mellitus with diabetic polyneuropathy: Secondary | ICD-10-CM

## 2018-08-07 NOTE — Patient Instructions (Addendum)
Diabetes and Foot Care Diabetes may cause you to have problems because of poor blood supply (circulation) to your feet and legs. This may cause the skin on your feet to become thinner, break easier, and heal more slowly. Your skin may become dry, and the skin may peel and crack. You may also have nerve damage in your legs and feet causing decreased feeling in them. You may not notice minor injuries to your feet that could lead to infections or more serious problems. Taking care of your feet is one of the most important things you can do for yourself. Follow these instructions at home:  Wear shoes at all times, even in the house. Do not go barefoot. Bare feet are easily injured.  Check your feet daily for blisters, cuts, and redness. If you cannot see the bottom of your feet, use a mirror or ask someone for help.  Wash your feet with warm water (do not use hot water) and mild soap. Then pat your feet and the areas between your toes until they are completely dry. Do not soak your feet as this can dry your skin.  Apply a moisturizing lotion or petroleum jelly (that does not contain alcohol and is unscented) to the skin on your feet and to dry, brittle toenails. Do not apply lotion between your toes.  Trim your toenails straight across. Do not dig under them or around the cuticle. File the edges of your nails with an emery board or nail file.  Do not cut corns or calluses or try to remove them with medicine.  Wear clean socks or stockings every day. Make sure they are not too tight. Do not wear knee-high stockings since they may decrease blood flow to your legs.  Wear shoes that fit properly and have enough cushioning. To break in new shoes, wear them for just a few hours a day. This prevents you from injuring your feet. Always look in your shoes before you put them on to be sure there are no objects inside.  Do not cross your legs. This may decrease the blood flow to your feet.  If you find a  minor scrape, cut, or break in the skin on your feet, keep it and the skin around it clean and dry. These areas may be cleansed with mild soap and water. Do not cleanse the area with peroxide, alcohol, or iodine.  When you remove an adhesive bandage, be sure not to damage the skin around it.  If you have a wound, look at it several times a day to make sure it is healing.  Do not use heating pads or hot water bottles. They may burn your skin. If you have lost feeling in your feet or legs, you may not know it is happening until it is too late.  Make sure your health care provider performs a complete foot exam at least annually or more often if you have foot problems. Report any cuts, sores, or bruises to your health care provider immediately. Contact a health care provider if:  You have an injury that is not healing.  You have cuts or breaks in the skin.  You have an ingrown nail.  You notice redness on your legs or feet.  You feel burning or tingling in your legs or feet.  You have pain or cramps in your legs and feet.  Your legs or feet are numb.  Your feet always feel cold. Get help right away if:  There is increasing   redness, swelling, or pain in or around a wound.  There is a red line that goes up your leg.  Pus is coming from a wound.  You develop a fever or as directed by your health care provider.  You notice a bad smell coming from an ulcer or wound. This information is not intended to replace advice given to you by your health care provider. Make sure you discuss any questions you have with your health care provider. Document Released: 08/31/2000 Document Revised: 02/09/2016 Document Reviewed: 02/10/2013 Elsevier Interactive Patient Education  2017 [employer]  Diabetic Neuropathy Diabetic neuropathy is a nerve disease or nerve damage that is caused by diabetes mellitus. About half of all people with diabetes mellitus have some form of nerve damage. Nerve damage  is more common in those who have had diabetes mellitus for many years and who generally have not had good control of their blood sugar (glucose) level. Diabetic neuropathy is a common complication of diabetes mellitus. There are three common types of diabetic neuropathy and a fourth type that is less common and less understood:  Peripheral neuropathy-This is the most common type of diabetic neuropathy. It causes damage to the nerves of the feet and legs first and then eventually the hands and arms. The damage affects the ability to sense touch.  Autonomic neuropathy-This type causes damage to the autonomic nervous system, which controls the following functions: ? Heartbeat. ? Body temperature. ? Blood pressure. ? Urination. ? Digestion. ? Sweating. ? Sexual function.  Focal neuropathy-Focal neuropathy can be painful and unpredictable and occurs most often in older adults with diabetes mellitus. It involves a specific nerve or one area and often comes on suddenly. It usually does not cause long-term problems.  Radiculoplexus neuropathy- Sometimes called lumbosacral radiculoplexus neuropathy, radiculoplexus neuropathy affects the nerves of the thighs, hips, buttocks, or legs. It is more common in people with type 2 diabetes mellitus and in older men. It is characterized by debilitating pain, weakness, and atrophy, usually in the thigh muscles.  What are the causes? The cause of peripheral, autonomic, and focal neuropathies is diabetes mellitus that is uncontrolled and high glucose levels. The cause of radiculoplexus neuropathy is unknown. However, it is thought to be caused by inflammation related to uncontrolled glucose levels. What are the signs or symptoms? Peripheral Neuropathy Peripheral neuropathy develops slowly over time. When the nerves of the feet and legs no longer work there may be:  Burning, stabbing, or aching pain in the legs or feet.  Inability to feel pressure or pain in your  feet. This can lead to: ? Thick calluses over pressure areas. ? Pressure sores. ? Ulcers.  Foot deformities.  Reduced ability to feel temperature changes.  Muscle weakness.  Autonomic Neuropathy The symptoms of autonomic neuropathy vary depending on which nerves are affected. Symptoms may include:  Problems with digestion, such as: ? Feeling sick to your stomach (nausea). ? Vomiting. ? Bloating. ? Constipation. ? Diarrhea. ? Abdominal pain.  Difficulty with urination. This occurs if you lose your ability to sense when your bladder is full. Problems include: ? Urine leakage (incontinence). ? Inability to empty your bladder completely (retention).  Rapid or irregular heartbeat (palpitations).  Blood pressure drops when you stand up (orthostatic hypotension). When you stand up you may feel: ? Dizzy. ? Weak. ? Faint.  In men, inability to attain and maintain an erection.  In women, vaginal dryness and problems with decreased sexual desire and arousal.  Problems with body temperature  regulation.  Increased or decreased sweating.  Focal Neuropathy  Abnormal eye movements or abnormal alignment of both eyes.  Weakness in the wrist.  Foot drop. This results in an inability to lift the foot properly and abnormal walking or foot movement.  Paralysis on one side of your face (Bell palsy).  Chest or abdominal pain. Radiculoplexus Neuropathy  Sudden, severe pain in your hip, thigh, or buttocks.  Weakness and wasting of thigh muscles.  Difficulty rising from a seated position.  Abdominal swelling.  Unexplained weight loss (usually more than 10 lb [4.5 kg]). How is this diagnosed? Peripheral Neuropathy Your senses may be tested. Sensory function testing can be done with:  A light touch using a monofilament.  A vibration with tuning fork.  A sharp sensation with a pin prick.  Other tests that can help diagnose neuropathy are:  Nerve conduction velocity. This  test checks the transmission of an electrical current through a nerve.  Electromyography. This shows how muscles respond to electrical signals transmitted by nearby nerves.  Quantitative sensory testing. This is used to assess how your nerves respond to vibrations and changes in temperature.  Autonomic Neuropathy Diagnosis is often based on reported symptoms. Tell your health care provider if you experience:  Dizziness.  Constipation.  Diarrhea.  Inappropriate urination or inability to urinate.  Inability to get or maintain an erection.  Tests that may be done include:  Electrocardiography or Holter monitor. These are tests that can help show problems with the heart rate or heart rhythm.  An X-ray exam may be done.  Focal Neuropathy Diagnosis is made based on your symptoms and what your health care provider finds during your exam. Other tests may be done. They may include:  Nerve conduction velocities. This checks the transmission of electrical current through a nerve.  Electromyography. This shows how muscles respond to electrical signals transmitted by nearby nerves.  Quantitative sensory testing. This test is used to assess how your nerves respond to vibration and changes in temperature.  Radiculoplexus Neuropathy  Often the first thing is to eliminate any other issue or problems that might be the cause, as there is no standard test for diagnosis.  X-ray exam of your spine and lumbar region.  Spinal tap to rule out cancer.  MRI to rule out other lesions. How is this treated? Once nerve damage occurs, it cannot be reversed. The goal of treatment is to keep the disease or nerve damage from getting worse and affecting more nerve fibers. Controlling your blood glucose level is the key. Most people with radiculoplexus neuropathy see at least a partial improvement over time. You will need to keep your blood glucose and HbA1c levels in the target range determined by your  health care provider. Things that help control blood glucose levels include:  Blood glucose monitoring.  Meal planning.  Physical activity.  Diabetes medicine.  Over time, maintaining lower blood glucose levels helps lessen symptoms. Sometimes, prescription pain medicine is needed. Follow these instructions at home:  Do not smoke.  Keep your blood glucose level in the range that you and your health care provider have determined acceptable for you.  Keep your blood pressure level in the range that you and your health care provider have determined acceptable for you.  Eat a well-balanced diet.  Be physically active every day. Include strength training and balance exercises.  Protect your feet. ? Check your feet every day for sores, cuts, blisters, or signs of infection. ? Wear padded socks  and supportive shoes. Use orthotic inserts, if necessary. ? Regularly check the insides of your shoes for worn spots. Make sure there are no rocks or other items inside your shoes before you put them on. Contact a health care provider if:  You have burning, stabbing, or aching pain in the legs or feet.  You are unable to feel pressure or pain in your feet.  You develop problems with digestion such as: ? Nausea. ? Vomiting. ? Bloating. ? Constipation. ? Diarrhea. ? Abdominal pain.  You have difficulty with urination, such as: ? Incontinence. ? Retention.  You have palpitations.  You develop orthostatic hypotension. When you stand up you may feel: ? Dizzy. ? Weak. ? Faint.  You cannot attain and maintain an erection (in men).  You have vaginal dryness and problems with decreased sexual desire and arousal (in women).  You have severe pain in your thighs, legs, or buttocks.  You have unexplained weight loss. This information is not intended to replace advice given to you by your health care provider. Make sure you discuss any questions you have with your health care  provider. Document Released: 11/12/2001 Document Revised: 02/09/2016 Document Reviewed: 02/12/2013 Elsevier Interactive Patient Education  2017 [place] are small areas of thickened skin that occur on the top, sides, or tip of a toe. They contain a cone-shaped core with a point that can press on a nerve below. This causes pain. Calluses are areas of thickened skin that can occur anywhere on the body including hands, fingers, palms, soles of the feet, and heels.Calluses are usually larger than corns. What are the causes? Corns and calluses are caused by rubbing (friction) or pressure, such as from shoes that are too tight or do not fit properly. What increases the risk? Corns are more likely to develop in people who have toe deformities, such as hammer toes. Since calluses can occur with friction to any area of the skin, calluses are more likely to develop in people who:  Work with their hands.  Wear shoes that fit poorly, shoes that are too tight, or shoes that are high-heeled.  Have toes deformities.  What are the signs or symptoms? Symptoms of a corn or callus include:  A hard growth on the skin.  Pain or tenderness under the skin.  Redness and swelling.  Increased discomfort while wearing tight-fitting shoes.  How is this diagnosed? Corns and calluses may be diagnosed with a medical history and physical exam. How is this treated? Corns and calluses may be treated with:  Removing the cause of the friction or pressure. This may include: ? Changing your shoes. ? Wearing shoe inserts (orthotics) or other protective layers in your shoes, such as a corn pad. ? Wearing gloves.  Medicines to help soften skin in the hardened, thickened areas.  Reducing the size of the corn or callus by removing the dead layers of skin.  Antibiotic medicines to treat infection.  Surgery, if a toe deformity is the cause.  Follow these instructions at  home:  Take medicines only as directed by your health care provider.  If you were prescribed an antibiotic, finish all of it even if you start to feel better.  Wear shoes that fit well. Avoid wearing high-heeled shoes and shoes that are too tight or too loose.  Wear any padding, protective layers, gloves, or orthotics as directed by your health care provider.  Soak your hands or feet and then use a file  or pumice stone to soften your corn or callus. Do this as directed by your health care provider.  Check your corn or callus every day for signs of infection. Watch for: ? Redness, swelling, or pain. ? Fluid, blood, or pus. Contact a health care provider if:  Your symptoms do not improve with treatment.  You have increased redness, swelling, or pain at the site of your corn or callus.  You have fluid, blood, or pus coming from your corn or callus.  You have new symptoms. This information is not intended to replace advice given to you by your health care provider. Make sure you discuss any questions you have with your health care provider. Document Released: 06/09/2004 Document Revised: 03/23/2016 Document Reviewed: 08/30/2014 Elsevier Interactive Patient Education  [employer].

## 2018-08-11 DIAGNOSIS — E1122 Type 2 diabetes mellitus with diabetic chronic kidney disease: Secondary | ICD-10-CM | POA: Diagnosis not present

## 2018-08-11 DIAGNOSIS — I129 Hypertensive chronic kidney disease with stage 1 through stage 4 chronic kidney disease, or unspecified chronic kidney disease: Secondary | ICD-10-CM | POA: Diagnosis not present

## 2018-08-11 DIAGNOSIS — Z6841 Body Mass Index (BMI) 40.0 and over, adult: Secondary | ICD-10-CM | POA: Diagnosis not present

## 2018-08-11 DIAGNOSIS — N184 Chronic kidney disease, stage 4 (severe): Secondary | ICD-10-CM | POA: Diagnosis not present

## 2018-08-25 ENCOUNTER — Encounter: Payer: Self-pay | Admitting: Podiatry

## 2018-08-25 NOTE — Progress Notes (Signed)
Subjective: Daniel Ochoa presents today with history of neuropathy with cc of painful, mycotic toenails.  Pain is aggravated when wearing enclosed shoe gear and relieved with periodic professional debridement.   Current Outpatient Medications:  .  amLODipine (NORVASC) 5 MG tablet, Take 5 mg by mouth daily. , Disp: , Rfl:  .  Ascorbic Acid (VITAMIN C) 1000 MG tablet, Take 1,000 mg by mouth daily., Disp: , Rfl:  .  atenolol (TENORMIN) 50 MG tablet, Take 50 mg by mouth 2 (two) times daily. , Disp: , Rfl:  .  Calcium Carb-Cholecalciferol (CALCIUM 600 + D PO), Take 1 tablet by mouth 2 (two) times daily., Disp: , Rfl:  .  clonazePAM (KLONOPIN) 1 MG tablet, Take 1 mg by mouth at bedtime. , Disp: , Rfl:  .  DYMISTA 137-50 MCG/ACT SUSP, Place 1 spray into both nostrils 2 (two) times daily. , Disp: , Rfl:  .  famotidine (PEPCID) 40 MG tablet, Take 40 mg by mouth at bedtime. , Disp: , Rfl:  .  FREESTYLE TEST STRIPS test strip, USE AS DIRECTED TO TEST BLOOD SUGAR 5 TIMES PER DAY. DX: E11.65, Disp: , Rfl: 11 .  furosemide (LASIX) 40 MG tablet, Take 40 mg by mouth 2 (two) times daily. , Disp: , Rfl:  .  HUMALOG 100 UNIT/ML injection, USE IN OMNIPOD DAILY (AVERAGE OF 110 UNITS/DAY) **E11.29, Disp: , Rfl: 2 .  HYDROcodone-acetaminophen (NORCO/VICODIN) 5-325 MG tablet, TAKE 1 TABLET BY MOUTH 4 TIMES DAILY AS NEEDED FOR MODERATE PAIN., Disp: , Rfl: 0 .  insulin regular human CONCENTRATED (HUMULIN R) 500 UNIT/ML injection, Inject into the skin. Pt uses with Insulin pump, Disp: , Rfl:  .  nystatin (MYCOSTATIN) 500000 units TABS tablet, Take 1 tablet by mouth daily as needed. Uses for heat rash, Disp: , Rfl:  .  oxyCODONE-acetaminophen (PERCOCET/ROXICET) 5-325 MG tablet, Take 1 tablet by mouth every 6 (six) hours as needed., Disp: 15 tablet, Rfl: 0 .  SYNTHROID 200 MCG tablet, Take 200 mcg by mouth See admin instructions. Takes the 200 mcg daily except on Sundays, pt takes 300 mcg (or 1.5 tablets), Disp: , Rfl:    Allergies  Allergen Reactions  . Ace Inhibitors Shortness Of Breath    choking  . Statins Other (See Comments)    Elevated CK level  . Latex Rash    Severe blistery rash  . Sudafed [Pseudoephedrine Hcl] Rash    Objective:  Vascular Examination: Capillary refill time immediate x 10 digits Dorsalis pedis and Posterior tibial pulses 2/4 b/l Digital hair x 10 digits was absent Skin temperature gradient WNL b/l  Dermatological Examination: Atrophic skin with absent hair growth b/l  Toenails 1-5 b/l discolored, thick, dystrophic with subungual debris and pain with palpation to nailbeds due to thickness of nails.  No interdigital macerations  No open wounds b/l  Musculoskeletal: Muscle strength 5/5 to all muscle groups b/l  HAV b/l  Hammertoe 2-5 b/l  Neurological: Sensation with 10 gram monofilament is diminished b/l Vibratory sensation absent b/l  Assessment: 1. Painful onychomycosis toenails 1-5 b/l 2. NIDDM with neuropathy  Plan: 1. Toenails 1-5 b/l were debrided in length and girth without iatrogenic bleeding. 2. Patient to continue soft, supportive shoe gear 3. Patient to report any pedal injuries to medical professional  4. Follow up 3 months. Patient/POA to call should there be a concern in the interim.

## 2018-09-18 DIAGNOSIS — R509 Fever, unspecified: Secondary | ICD-10-CM | POA: Diagnosis not present

## 2018-09-18 DIAGNOSIS — J181 Lobar pneumonia, unspecified organism: Secondary | ICD-10-CM | POA: Diagnosis not present

## 2018-09-18 DIAGNOSIS — I10 Essential (primary) hypertension: Secondary | ICD-10-CM | POA: Diagnosis not present

## 2018-09-18 DIAGNOSIS — R05 Cough: Secondary | ICD-10-CM | POA: Diagnosis not present

## 2018-09-18 DIAGNOSIS — E1129 Type 2 diabetes mellitus with other diabetic kidney complication: Secondary | ICD-10-CM | POA: Diagnosis not present

## 2018-09-18 DIAGNOSIS — Z6838 Body mass index (BMI) 38.0-38.9, adult: Secondary | ICD-10-CM | POA: Diagnosis not present

## 2018-09-23 DIAGNOSIS — Z1389 Encounter for screening for other disorder: Secondary | ICD-10-CM | POA: Diagnosis not present

## 2018-09-23 DIAGNOSIS — G4733 Obstructive sleep apnea (adult) (pediatric): Secondary | ICD-10-CM | POA: Diagnosis not present

## 2018-09-23 DIAGNOSIS — R05 Cough: Secondary | ICD-10-CM | POA: Diagnosis not present

## 2018-09-23 DIAGNOSIS — E1129 Type 2 diabetes mellitus with other diabetic kidney complication: Secondary | ICD-10-CM | POA: Diagnosis not present

## 2018-09-23 DIAGNOSIS — N184 Chronic kidney disease, stage 4 (severe): Secondary | ICD-10-CM | POA: Diagnosis not present

## 2018-09-23 DIAGNOSIS — M797 Fibromyalgia: Secondary | ICD-10-CM | POA: Diagnosis not present

## 2018-09-23 DIAGNOSIS — Z6839 Body mass index (BMI) 39.0-39.9, adult: Secondary | ICD-10-CM | POA: Diagnosis not present

## 2018-09-30 DIAGNOSIS — M6281 Muscle weakness (generalized): Secondary | ICD-10-CM | POA: Diagnosis not present

## 2018-09-30 DIAGNOSIS — J181 Lobar pneumonia, unspecified organism: Secondary | ICD-10-CM | POA: Diagnosis not present

## 2018-09-30 DIAGNOSIS — I10 Essential (primary) hypertension: Secondary | ICD-10-CM | POA: Diagnosis not present

## 2018-09-30 DIAGNOSIS — M545 Low back pain: Secondary | ICD-10-CM | POA: Diagnosis not present

## 2018-09-30 DIAGNOSIS — M455 Ankylosing spondylitis of thoracolumbar region: Secondary | ICD-10-CM | POA: Diagnosis not present

## 2018-09-30 DIAGNOSIS — M797 Fibromyalgia: Secondary | ICD-10-CM | POA: Diagnosis not present

## 2018-09-30 DIAGNOSIS — R05 Cough: Secondary | ICD-10-CM | POA: Diagnosis not present

## 2018-09-30 DIAGNOSIS — G309 Alzheimer's disease, unspecified: Secondary | ICD-10-CM | POA: Diagnosis not present

## 2018-09-30 DIAGNOSIS — E1129 Type 2 diabetes mellitus with other diabetic kidney complication: Secondary | ICD-10-CM | POA: Diagnosis not present

## 2018-09-30 DIAGNOSIS — S22080A Wedge compression fracture of T11-T12 vertebra, initial encounter for closed fracture: Secondary | ICD-10-CM | POA: Diagnosis not present

## 2018-09-30 DIAGNOSIS — N184 Chronic kidney disease, stage 4 (severe): Secondary | ICD-10-CM | POA: Diagnosis not present

## 2018-10-03 DIAGNOSIS — H524 Presbyopia: Secondary | ICD-10-CM | POA: Diagnosis not present

## 2018-10-03 DIAGNOSIS — H2512 Age-related nuclear cataract, left eye: Secondary | ICD-10-CM | POA: Diagnosis not present

## 2018-10-03 DIAGNOSIS — H04123 Dry eye syndrome of bilateral lacrimal glands: Secondary | ICD-10-CM | POA: Diagnosis not present

## 2018-11-05 DIAGNOSIS — N184 Chronic kidney disease, stage 4 (severe): Secondary | ICD-10-CM | POA: Diagnosis not present

## 2018-11-06 ENCOUNTER — Ambulatory Visit: Payer: Medicare Other | Admitting: Podiatry

## 2018-11-11 DIAGNOSIS — Z6841 Body Mass Index (BMI) 40.0 and over, adult: Secondary | ICD-10-CM | POA: Diagnosis not present

## 2018-11-11 DIAGNOSIS — I129 Hypertensive chronic kidney disease with stage 1 through stage 4 chronic kidney disease, or unspecified chronic kidney disease: Secondary | ICD-10-CM | POA: Diagnosis not present

## 2018-11-11 DIAGNOSIS — N184 Chronic kidney disease, stage 4 (severe): Secondary | ICD-10-CM | POA: Diagnosis not present

## 2018-11-11 DIAGNOSIS — E1122 Type 2 diabetes mellitus with diabetic chronic kidney disease: Secondary | ICD-10-CM | POA: Diagnosis not present

## 2018-11-12 DIAGNOSIS — H903 Sensorineural hearing loss, bilateral: Secondary | ICD-10-CM | POA: Diagnosis not present

## 2018-11-12 DIAGNOSIS — H838X3 Other specified diseases of inner ear, bilateral: Secondary | ICD-10-CM | POA: Diagnosis not present

## 2018-11-18 DIAGNOSIS — N184 Chronic kidney disease, stage 4 (severe): Secondary | ICD-10-CM | POA: Diagnosis not present

## 2018-11-18 DIAGNOSIS — Z4681 Encounter for fitting and adjustment of insulin pump: Secondary | ICD-10-CM | POA: Diagnosis not present

## 2018-11-18 DIAGNOSIS — Z794 Long term (current) use of insulin: Secondary | ICD-10-CM | POA: Diagnosis not present

## 2018-11-18 DIAGNOSIS — E1129 Type 2 diabetes mellitus with other diabetic kidney complication: Secondary | ICD-10-CM | POA: Diagnosis not present

## 2018-11-18 DIAGNOSIS — I10 Essential (primary) hypertension: Secondary | ICD-10-CM | POA: Diagnosis not present

## 2018-12-03 ENCOUNTER — Encounter: Payer: Self-pay | Admitting: Podiatry

## 2018-12-03 ENCOUNTER — Ambulatory Visit (INDEPENDENT_AMBULATORY_CARE_PROVIDER_SITE_OTHER): Payer: Medicare Other

## 2018-12-03 ENCOUNTER — Ambulatory Visit: Payer: Medicare Other | Admitting: Podiatry

## 2018-12-03 ENCOUNTER — Other Ambulatory Visit: Payer: Self-pay

## 2018-12-03 ENCOUNTER — Ambulatory Visit (INDEPENDENT_AMBULATORY_CARE_PROVIDER_SITE_OTHER): Payer: Medicare Other | Admitting: Podiatry

## 2018-12-03 DIAGNOSIS — M79675 Pain in left toe(s): Secondary | ICD-10-CM

## 2018-12-03 DIAGNOSIS — E11621 Type 2 diabetes mellitus with foot ulcer: Secondary | ICD-10-CM

## 2018-12-03 DIAGNOSIS — E1142 Type 2 diabetes mellitus with diabetic polyneuropathy: Secondary | ICD-10-CM

## 2018-12-03 DIAGNOSIS — B351 Tinea unguium: Secondary | ICD-10-CM | POA: Diagnosis not present

## 2018-12-03 DIAGNOSIS — M79674 Pain in right toe(s): Secondary | ICD-10-CM | POA: Diagnosis not present

## 2018-12-03 DIAGNOSIS — L97529 Non-pressure chronic ulcer of other part of left foot with unspecified severity: Secondary | ICD-10-CM | POA: Diagnosis not present

## 2018-12-03 MED ORDER — AMOXICILLIN 500 MG PO CAPS: 500.0000 mg | ORAL_CAPSULE | Freq: Three times a day (TID) | ORAL | 0 refills | Status: DC

## 2018-12-03 NOTE — Progress Notes (Signed)
Subjective: Patient presents today with for preventative diabetic foot care.  He is seen for discolored, thick toenails which interfere with daily activities. Pain is aggravated when wearing enclosed shoe gear. Pain is getting progressively worse and relieved with periodic professional debridement.  Patient states his wife noticed he is wearing his compression hose too tight around his toes.  Patient is essentially neuropathic and does not feel this.  [name], MD is his PCP.  Allergies  Allergen Reactions  . Ace Inhibitors Shortness Of Breath    choking  . Statins Other (See Comments)    Elevated CK level  . Latex Rash    Severe blistery rash  . Sudafed [Pseudoephedrine Hcl] Rash     Objective:  Vascular Examination: Capillary refill time immediate x 10 digits.  Dorsalis pedis pulses 2/4 bilaterally  Posterior tibial pulses 2/4 bilaterally.  Digital hair absent x 10 digits.  Skin temperature gradient within normal limits b/l  Dermatological Examination: Skin thin, shiny and atrophic b/l  Toenails 1-5 b/l discolored, thick, dystrophic with subungual debris and pain with palpation to nailbeds due to thickness of nails.  Ulceration located distal tip left great toe.  Predebridement measurements carried out today of 0.7 x 0.7 cm.  There is mild erythema to the distal tip of the digit, no streaking or lymphangitis noted.  No probing to bone, no undermining, no active pus or purulence noted.  No deep abscess, no erythema, no edema, no cellulitis, no odor was encountered.    Postdebridement measurements today are: 1.0 x 1.0 x 0.1 cm.  Again there is mild erythema to the distal tip of the left great toe.  There is no streaking or lymphangitis noted.  No probing to bone, no undermining, no active pus or purulence noted.  No deep abscess, no erythema, no edema, no cellulitis, no odor was encountered.    Musculoskeletal: Muscle strength 5/5 to all LE muscle groups  Hallux  abductovalgus with bunion deformity bilaterally.  Hammertoes 2 through 5 bilaterally  Neurological: Sensation diminished with 10 gram monofilament.  X-ray left foot reveals no evidence of osteolysis of bone left great toe.  Assessment: 1. Painful onychomycosis toenails 1-5 b/l 3.   Diabetic ulceration left great toe 4.   NIDDM with peripheral neuropathy  Plan: 1. Toenails 1-5 b/l were debrided in length and girth without iatrogenic bleeding. 2. Ulcer was debrided and reactive hyperkeratoses and necrotic tissue was resected to the level of bleeding or viable tissue. Ulcer was cleansed with wound cleanser.  Triple antibiotic ointment was applied to base of wound with light dressing. 3. Prescription sent to pharmacy for amoxicillin 500 mg.  Patient is to take 1 capsule 3 times a day. 4. Xray of the left foot was performed and reviewed with patient. 5. Surgical shoe size extra-large was dispensed for left foot. 6. Patient was given instructions on offloading and dressing change/aftercare and was instructed to call immediately if any signs or symptoms of infection arise.  7. Patient instructed to report to emergency department with worsening appearance of ulcer/toe/foot, increased pain, foul odor, increased redness, swelling, drainage, fever, chills, nightsweats, nausea, vomiting, increased blood sugar.  8. Patient/POA related understanding. 9. Follow up 1 week 10. Patient/POA to call should there be a concern in the interim.

## 2018-12-03 NOTE — Patient Instructions (Addendum)
DRESSING CHANGES LEFT FOOT:  WEAR SURGICAL SHOE AT ALL TIMES    1. KEEP LEFT  FOOT DRY AT ALL TIMES!!!!  2. CLEANSE ULCER WITH SALINE.  3. DAB DRY WITH GAUZE SPONGE.  4. APPLY A LIGHT AMOUNT OF MEDIHONEY TO BASE OF ULCER.  5. APPLY OUTER DRESSING/BAND-AID AS INSTRUCTED.  6. WEAR SURGICAL SHOE DAILY AT ALL TIMES.  7. DO NOT WALK BAREFOOT!!!  8.  IF YOU EXPERIENCE ANY FEVER, CHILLS, NIGHTSWEATS, NAUSEA OR VOMITING, ELEVATED OR LOW BLOOD SUGARS, REPORT TO EMERGENCY ROOM.  9. IF YOU EXPERIENCE INCREASED REDNESS, PAIN, SWELLING, DISCOLORATION, ODOR, PUS, DRAINAGE OR WARMTH OF YOUR FOOT, REPORT TO EMERGENCY ROOM.   Diabetes Mellitus and Foot Care Foot care is an important part of your health, especially when you have diabetes. Diabetes may cause you to have problems because of poor blood flow (circulation) to your feet and legs, which can cause your skin to:  Become thinner and drier.  Break more easily.  Heal more slowly.  Peel and crack. You may also have nerve damage (neuropathy) in your legs and feet, causing decreased feeling in them. This means that you may not notice minor injuries to your feet that could lead to more serious problems. Noticing and addressing any potential problems early is the best way to prevent future foot problems. How to care for your feet Foot hygiene  Wash your feet daily with warm water and mild soap. Do not use hot water. Then, pat your feet and the areas between your toes until they are completely dry. Do not soak your feet as this can dry your skin.  Trim your toenails straight across. Do not dig under them or around the cuticle. File the edges of your nails with an emery board or nail file.  Apply a moisturizing lotion or petroleum jelly to the skin on your feet and to dry, brittle toenails. Use lotion that does not contain alcohol and is unscented. Do not apply lotion between your toes. Shoes and socks  Wear clean socks or stockings every  day. Make sure they are not too tight. Do not wear knee-high stockings since they may decrease blood flow to your legs.  Wear shoes that fit properly and have enough cushioning. Always look in your shoes before you put them on to be sure there are no objects inside.  To break in new shoes, wear them for just a few hours a day. This prevents injuries on your feet. Wounds, scrapes, corns, and calluses  Check your feet daily for blisters, cuts, bruises, sores, and redness. If you cannot see the bottom of your feet, use a mirror or ask someone for help.  Do not cut corns or calluses or try to remove them with medicine.  If you find a minor scrape, cut, or break in the skin on your feet, keep it and the skin around it clean and dry. You may clean these areas with mild soap and water. Do not clean the area with peroxide, alcohol, or iodine.  If you have a wound, scrape, corn, or callus on your foot, look at it several times a day to make sure it is healing and not infected. Check for: ? Redness, swelling, or pain. ? Fluid or blood. ? Warmth. ? Pus or a bad smell. General instructions  Do not cross your legs. This may decrease blood flow to your feet.  Do not use heating pads or hot water bottles on your feet. They may burn your skin.  If you have lost feeling in your feet or legs, you may not know this is happening until it is too late.  Protect your feet from hot and cold by wearing shoes, such as at the beach or on hot pavement.  Schedule a complete foot exam at least once a year (annually) or more often if you have foot problems. If you have foot problems, report any cuts, sores, or bruises to your health care provider immediately. Contact a health care provider if:  You have a medical condition that increases your risk of infection and you have any cuts, sores, or bruises on your feet.  You have an injury that is not healing.  You have redness on your legs or feet.  You feel burning  or tingling in your legs or feet.  You have pain or cramps in your legs and feet.  Your legs or feet are numb.  Your feet always feel cold.  You have pain around a toenail. Get help right away if:  You have a wound, scrape, corn, or callus on your foot and: ? You have pain, swelling, or redness that gets worse. ? You have fluid or blood coming from the wound, scrape, corn, or callus. ? Your wound, scrape, corn, or callus feels warm to the touch. ? You have pus or a bad smell coming from the wound, scrape, corn, or callus. ? You have a fever. ? You have a red line going up your leg. Summary  Check your feet every day for cuts, sores, red spots, swelling, and blisters.  Moisturize feet and legs daily.  Wear shoes that fit properly and have enough cushioning.  If you have foot problems, report any cuts, sores, or bruises to your health care provider immediately.  Schedule a complete foot exam at least once a year (annually) or more often if you have foot problems. This information is not intended to replace advice given to you by your health care provider. Make sure you discuss any questions you have with your health care provider. Document Released: 08/31/2000 Document Revised: 10/16/2017 Document Reviewed: 10/05/2016 Elsevier Interactive Patient Education  2019 [employer]  Onychomycosis/Fungal Toenails  WHAT IS IT? An infection that lies within the keratin of your nail plate that is caused by a fungus.  WHY ME? Fungal infections affect all ages, sexes, races, and creeds.  There may be many factors that predispose you to a fungal infection such as age, coexisting medical conditions such as diabetes, or an autoimmune disease; stress, medications, fatigue, genetics, etc.  Bottom line: fungus thrives in a warm, moist environment and your shoes offer such a location.  IS IT CONTAGIOUS? Theoretically, yes.  You do not want to share shoes, nail clippers or files with someone who  has fungal toenails.  Walking around barefoot in the same room or sleeping in the same bed is unlikely to transfer the organism.  It is important to realize, however, that fungus can spread easily from one nail to the next on the same foot.  HOW DO WE TREAT THIS?  There are several ways to treat this condition.  Treatment may depend on many factors such as age, medications, pregnancy, liver and kidney conditions, etc.  It is best to ask your doctor which options are available to you.  1. No treatment.   Unlike many other medical concerns, you can live with this condition.  However for many people this can be a painful condition and may lead to ingrown toenails  or a bacterial infection.  It is recommended that you keep the nails cut short to help reduce the amount of fungal nail. 2. Topical treatment.  These range from herbal remedies to prescription strength nail lacquers.  About 40-50% effective, topicals require twice daily application for approximately 9 to 12 months or until an entirely new nail has grown out.  The most effective topicals are medical grade medications available through physicians offices. 3. Oral antifungal medications.  With an 80-90% cure rate, the most common oral medication requires 3 to 4 months of therapy and stays in your system for a year as the new nail grows out.  Oral antifungal medications do require blood work to make sure it is a safe drug for you.  A liver function panel will be performed prior to starting the medication and after the first month of treatment.  It is important to have the blood work performed to avoid any harmful side effects.  In general, this medication safe but blood work is required. 4. Laser Therapy.  This treatment is performed by applying a specialized laser to the affected nail plate.  This therapy is noninvasive, fast, and non-painful.  It is not covered by insurance and is therefore, out of pocket.  The results have been very good with a 80-95%  cure rate.  [facility] is the only practice in the area to offer this therapy. 5. Permanent Nail Avulsion.  Removing the entire nail so that a new nail will not grow back.

## 2018-12-09 ENCOUNTER — Ambulatory Visit: Payer: Medicare Other | Admitting: Podiatry

## 2018-12-09 ENCOUNTER — Ambulatory Visit (INDEPENDENT_AMBULATORY_CARE_PROVIDER_SITE_OTHER): Payer: Medicare Other | Admitting: Podiatry

## 2018-12-09 ENCOUNTER — Other Ambulatory Visit: Payer: Self-pay

## 2018-12-09 DIAGNOSIS — L97529 Non-pressure chronic ulcer of other part of left foot with unspecified severity: Secondary | ICD-10-CM

## 2018-12-09 DIAGNOSIS — E11621 Type 2 diabetes mellitus with foot ulcer: Secondary | ICD-10-CM

## 2018-12-09 MED ORDER — MUPIROCIN CALCIUM 2 % EX CREA: 1.0000 "application " | TOPICAL_CREAM | Freq: Two times a day (BID) | CUTANEOUS | 0 refills | Status: DC

## 2018-12-09 NOTE — Patient Instructions (Signed)
DRESSING CHANGES LEFT FOOT! WEAR SURGICAL SHOE AT ALL TIMES    1. KEEP LEFT  FOOT DRY AT ALL TIMES!!!!  2. CLEANSE ULCER WITH SALINE.  3. DAB DRY WITH GAUZE SPONGE.  4. APPLY A LIGHT AMOUNT OF MUPIROCIN OINTMENT TO BASE OF ULCER.  5. APPLY OUTER DRESSING/BAND-AID AS INSTRUCTED.  6. WEAR SURGICAL SHOE DAILY AT ALL TIMES.  7. DO NOT WALK BAREFOOT!!!  8.  IF YOU EXPERIENCE ANY FEVER, CHILLS, NIGHTSWEATS, NAUSEA OR VOMITING, ELEVATED OR LOW BLOOD SUGARS, REPORT TO EMERGENCY ROOM.  9. IF YOU EXPERIENCE INCREASED REDNESS, PAIN, SWELLING, DISCOLORATION, ODOR, PUS, DRAINAGE OR WARMTH OF YOUR FOOT, REPORT TO EMERGENCY ROOM.

## 2018-12-10 ENCOUNTER — Ambulatory Visit: Payer: Medicare Other | Admitting: Podiatry

## 2018-12-11 ENCOUNTER — Encounter: Payer: Self-pay | Admitting: Podiatry

## 2018-12-11 NOTE — Progress Notes (Signed)
Subjective: Patient presents today for follow up ulcer distal tip left great toe. He states he feels it is better. His wife has been assisting him in performing dressing changes with Medihoney. He has been wearing his surgical shoe and keeping the foot dry.  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  He did bring his diabetic shoes in on today.   He denies any fever, chills, nightsweats, nausea and vomiting.  Allergies  Allergen Reactions  . Ace Inhibitors Shortness Of Breath    choking  . Statins Other (See Comments)    Elevated CK level  . Latex Rash    Severe blistery rash  . Sudafed [Pseudoephedrine Hcl] Rash     Objective:  Vascular Examination: Capillary refill time immediate x 10 digits.  Dorsalis pedis pulses 2/4 bilaterally  Posterior tibial pulses 2/4 bilaterally.  Digital hair absent x 10 digits.  Skin temperature gradient within normal limits b/l  No ischemic nor gangrenous changes noted.  Dermatological Examination: Skin thin, shiny and atrophic b/l  Toenails 1-5 b/l recently debrided and adequate length.  Ulceration located distal tip left great toe.  Predebridement measurements of crust carried out today of 0.5 x 0.5 cm.  There is pinpoint white fibrous tissue measuring 0.2 x 0.2 x 0.1 cm. There is resolved erythema to the distal tip of the digit. There is dried blister layer extending to medial border with no streaking or lymphangitis noted.  No probing to bone, no undermining, no active pus or purulence noted.  No deep abscess, no erythema, no edema, no cellulitis, no odor was encountered.    Postdebridement measurements today are: 0.2 x 0.2 x 0.1 cm.  There is no streaking or lymphangitis noted.  No probing to bone, no undermining, no active pus or purulence noted.  No deep abscess, no erythema, no edema, no cellulitis, no odor was encountered.    Musculoskeletal: Muscle strength 5/5 to all LE muscle groups  Hallux abductovalgus with bunion deformity  bilaterally.  Hammertoes 2 through 5 bilaterally  Neurological: Sensation diminished with 10 gram monofilament.  X-ray left foot reveals no evidence of osteolysis of bone left great toe.  Assessment: 1.   Diabetic ulceration left great toe  Plan:  1. Ulcer was debrided of fibrous tissue. Ulcer was cleansed with wound cleanser.  Triple antibiotic ointment was applied to base of wound with light dressing.  2. Discontinue Medihoney Gel.  3. Prescription sent to pharmacy for Mupirocin Ointment. Patient to apply to digit twice daily with light dressing. 4. Complete Amoxicillin until all gone. Patient is to take 1 capsule 3 times a day. 5. Continue Surgical shoe daily left foot. 6. Patient was given instructions on offloading and dressing change/aftercare and was instructed to call immediately if any signs or symptoms of infection arise.  7. Patient instructed to report to emergency department with worsening appearance of ulcer/toe/foot, increased pain, foul odor, increased redness, swelling, drainage, fever, chills, nightsweats, nausea, vomiting, increased blood sugar.  8. Patient/POA related understanding. 9. Follow up 2 weeks. Call if there are any problems/concerns.

## 2018-12-23 ENCOUNTER — Encounter: Payer: Self-pay | Admitting: Podiatry

## 2018-12-23 ENCOUNTER — Ambulatory Visit (INDEPENDENT_AMBULATORY_CARE_PROVIDER_SITE_OTHER): Payer: Medicare Other | Admitting: Podiatry

## 2018-12-23 ENCOUNTER — Other Ambulatory Visit: Payer: Self-pay

## 2018-12-23 VITALS — Temp 97.9°F

## 2018-12-23 DIAGNOSIS — L97529 Non-pressure chronic ulcer of other part of left foot with unspecified severity: Secondary | ICD-10-CM | POA: Diagnosis not present

## 2018-12-23 DIAGNOSIS — E11621 Type 2 diabetes mellitus with foot ulcer: Secondary | ICD-10-CM | POA: Diagnosis not present

## 2018-12-24 ENCOUNTER — Encounter: Payer: Self-pay | Admitting: Podiatry

## 2018-12-24 NOTE — Progress Notes (Signed)
Subjective: Patient presents today for follow up ulcer distal tip left great toe. He states he feels it is better. Wife continues to help with dressing changes.    He voices no new pedal concerns on today's visit.   He denies any fever, chills, nightsweats, nausea and vomiting.  Allergies  Allergen Reactions  . Ace Inhibitors Shortness Of Breath    choking  . Statins Other (See Comments)    Elevated CK level  . Latex Rash    Severe blistery rash  . Sudafed [Pseudoephedrine Hcl] Rash     Objective:  Vascular Examination: Capillary refill time immediate x 10 digits.  Dorsalis pedis pulses 2/4 bilaterally  Posterior tibial pulses 2/4 bilaterally.  Digital hair absent x 10 digits.  Skin temperature gradient within normal limits b/l  No ischemic nor gangrenous changes noted.  Dermatological Examination: Skin thin, shiny and atrophic b/l  Toenails 1-5 b/l recently debrided and adequate length.  Ulceration located distal tip left great toe.    Predebridement measurements a scab measuring 1.0 x 1.0 cm.   No probing to bone, no undermining, no active pus or purulence noted.  No deep abscess, no erythema, no edema, no cellulitis, no odor was encountered.    Postdebridement measurements today are: 0.1 x 0.1 and is nearly planar in depth. There is no streaking or lymphangitis noted.  No probing to bone, no undermining, no active pus or purulence noted.  No deep abscess, no erythema, no edema, no cellulitis, no odor was encountered.    Musculoskeletal: Muscle strength 5/5 to all LE muscle groups  Hallux abductovalgus with bunion deformity bilaterally.  Hammertoes 2 through 5 bilaterally  Neurological: Sensation diminished with 10 gram monofilament.  Assessment: 1.   Diabetic ulceration left great toe, healing slowly  Plan:  1. Ulcer was debrided. Ulcer was cleansed with wound cleanser.  Medihoney was applied to base of wound with light dressing.  2. Continue daily  dressings with Mupirocin Ointment. 3. Continue Surgical shoe daily left foot. 4. Patient was given instructions on offloading and dressing change/aftercare and was instructed to call immediately if any signs or symptoms of infection arise.  5. Patient instructed to report to emergency department with worsening appearance of ulcer/toe/foot, increased pain, foul odor, increased redness, swelling, drainage, fever, chills, nightsweats, nausea, vomiting, increased blood sugar.  6. Patientrelated understanding. 7. Follow up 2 weeks. Call if there are any problems/concerns.

## 2019-01-07 ENCOUNTER — Ambulatory Visit (INDEPENDENT_AMBULATORY_CARE_PROVIDER_SITE_OTHER): Payer: Medicare Other | Admitting: Podiatry

## 2019-01-07 ENCOUNTER — Encounter: Payer: Self-pay | Admitting: Podiatry

## 2019-01-07 ENCOUNTER — Other Ambulatory Visit: Payer: Self-pay

## 2019-01-07 VITALS — Temp 97.3°F

## 2019-01-07 DIAGNOSIS — L97529 Non-pressure chronic ulcer of other part of left foot with unspecified severity: Secondary | ICD-10-CM | POA: Diagnosis not present

## 2019-01-07 DIAGNOSIS — E11621 Type 2 diabetes mellitus with foot ulcer: Secondary | ICD-10-CM

## 2019-01-14 ENCOUNTER — Encounter: Payer: Self-pay | Admitting: Podiatry

## 2019-01-14 NOTE — Progress Notes (Signed)
Subjective:   Mr.  Daniel Ochoa presents today for continued care of ulceration of distal tip of great toe left foot.  Patient states wife has performing daily dressing changes as instructed.  Pt. denies any new complaints.  Patient denies any fever, chills, nightsweats, nausea or vomiting.    He and his wife think it is healed.   Current Outpatient Medications:  .  amLODipine (NORVASC) 5 MG tablet, Take 5 mg by mouth daily. , Disp: , Rfl:  .  amoxicillin (AMOXIL) 500 MG capsule, Take 1 capsule (500 mg total) by mouth 3 (three) times daily., Disp: 30 capsule, Rfl: 0 .  Ascorbic Acid (VITAMIN C) 1000 MG tablet, Take 1,000 mg by mouth daily., Disp: , Rfl:  .  atenolol (TENORMIN) 50 MG tablet, Take 50 mg by mouth 2 (two) times daily. , Disp: , Rfl:  .  Calcium Carb-Cholecalciferol (CALCIUM 600 + D PO), Take 1 tablet by mouth 2 (two) times daily., Disp: , Rfl:  .  clonazePAM (KLONOPIN) 1 MG tablet, Take 1 mg by mouth at bedtime. , Disp: , Rfl:  .  DYMISTA 137-50 MCG/ACT SUSP, Place 1 spray into both nostrils 2 (two) times daily. , Disp: , Rfl:  .  famotidine (PEPCID) 40 MG tablet, Take 40 mg by mouth at bedtime. , Disp: , Rfl:  .  FREESTYLE TEST STRIPS test strip, USE AS DIRECTED TO TEST BLOOD SUGAR 5 TIMES PER DAY. DX: E11.65, Disp: , Rfl: 11 .  furosemide (LASIX) 40 MG tablet, Take 40 mg by mouth 2 (two) times daily. , Disp: , Rfl:  .  HUMALOG 100 UNIT/ML injection, USE IN OMNIPOD DAILY (AVERAGE OF 110 UNITS/DAY) **E11.29, Disp: , Rfl: 2 .  HYDROcodone-acetaminophen (NORCO/VICODIN) 5-325 MG tablet, TAKE 1 TABLET BY MOUTH 4 TIMES DAILY AS NEEDED FOR MODERATE PAIN., Disp: , Rfl: 0 .  insulin regular human CONCENTRATED (HUMULIN R) 500 UNIT/ML injection, Inject into the skin. Pt uses with Insulin pump, Disp: , Rfl:  .  ipratropium (ATROVENT) 0.03 % nasal spray, USE 1 TO 2 SPRAY(S) IN EACH NOSTRIL THREE TIMES DAILY AS NEEDED FOR CONGESTION, Disp: , Rfl:  .  mupirocin cream (BACTROBAN) 2 %, Apply  1 application topically 2 (two) times daily., Disp: 15 g, Rfl: 0 .  nystatin (MYCOSTATIN) 500000 units TABS tablet, Take 1 tablet by mouth daily as needed. Uses for heat rash, Disp: , Rfl:  .  oxyCODONE-acetaminophen (PERCOCET/ROXICET) 5-325 MG tablet, Take 1 tablet by mouth every 6 (six) hours as needed., Disp: 15 tablet, Rfl: 0 .  SYNTHROID 200 MCG tablet, Take 200 mcg by mouth See admin instructions. Takes the 200 mcg daily except on Sundays, pt takes 300 mcg (or 1.5 tablets), Disp: , Rfl:    Allergies  Allergen Reactions  . Ace Inhibitors Shortness Of Breath    choking  . Statins Other (See Comments)    Elevated CK level  . Latex Rash    Severe blistery rash  . Sudafed [Pseudoephedrine Hcl] Rash     Objective:   Vascular status intact b/l.  Diminished sensation with 10 gram monofilament b/l.  Ulceration located distal tip left hallux. Area is completely epithelialized with no erythema, no edema, no drainage.  Assessment:   1.  Diabetic Ulceration left hallux, healed 2.    NIDDM with peripheral neuropathy  Plan: 1. Resume normal daily activity. 2. Resume diabetic shoes daily. 3. Dispensed digital toe cap for protection. 4. Patient is to follow up 7 weeks for diabetic  foot care. 5. Patient instructed to report to emergency department with worsening appearance of ulcer/toe/foot, increased pain, foul odor, increased redness, swelling, drainage, fever, chills, nightsweats, nausea, vomiting, increased blood sugar.  6. Patient/POA related understanding.

## 2019-02-03 DIAGNOSIS — N184 Chronic kidney disease, stage 4 (severe): Secondary | ICD-10-CM | POA: Diagnosis not present

## 2019-02-10 DIAGNOSIS — Z125 Encounter for screening for malignant neoplasm of prostate: Secondary | ICD-10-CM | POA: Diagnosis not present

## 2019-02-10 DIAGNOSIS — Z6841 Body Mass Index (BMI) 40.0 and over, adult: Secondary | ICD-10-CM | POA: Diagnosis not present

## 2019-02-10 DIAGNOSIS — I129 Hypertensive chronic kidney disease with stage 1 through stage 4 chronic kidney disease, or unspecified chronic kidney disease: Secondary | ICD-10-CM | POA: Diagnosis not present

## 2019-02-10 DIAGNOSIS — E786 Lipoprotein deficiency: Secondary | ICD-10-CM | POA: Diagnosis not present

## 2019-02-10 DIAGNOSIS — N184 Chronic kidney disease, stage 4 (severe): Secondary | ICD-10-CM | POA: Diagnosis not present

## 2019-02-10 DIAGNOSIS — R82998 Other abnormal findings in urine: Secondary | ICD-10-CM | POA: Diagnosis not present

## 2019-02-10 DIAGNOSIS — E038 Other specified hypothyroidism: Secondary | ICD-10-CM | POA: Diagnosis not present

## 2019-02-10 DIAGNOSIS — E1129 Type 2 diabetes mellitus with other diabetic kidney complication: Secondary | ICD-10-CM | POA: Diagnosis not present

## 2019-02-10 DIAGNOSIS — E1122 Type 2 diabetes mellitus with diabetic chronic kidney disease: Secondary | ICD-10-CM | POA: Diagnosis not present

## 2019-02-10 DIAGNOSIS — D631 Anemia in chronic kidney disease: Secondary | ICD-10-CM | POA: Diagnosis not present

## 2019-02-17 DIAGNOSIS — M797 Fibromyalgia: Secondary | ICD-10-CM | POA: Diagnosis not present

## 2019-02-17 DIAGNOSIS — G309 Alzheimer's disease, unspecified: Secondary | ICD-10-CM | POA: Diagnosis not present

## 2019-02-17 DIAGNOSIS — N184 Chronic kidney disease, stage 4 (severe): Secondary | ICD-10-CM | POA: Diagnosis not present

## 2019-02-17 DIAGNOSIS — Z794 Long term (current) use of insulin: Secondary | ICD-10-CM | POA: Diagnosis not present

## 2019-02-17 DIAGNOSIS — G4733 Obstructive sleep apnea (adult) (pediatric): Secondary | ICD-10-CM | POA: Diagnosis not present

## 2019-02-17 DIAGNOSIS — E1129 Type 2 diabetes mellitus with other diabetic kidney complication: Secondary | ICD-10-CM | POA: Diagnosis not present

## 2019-02-17 DIAGNOSIS — I129 Hypertensive chronic kidney disease with stage 1 through stage 4 chronic kidney disease, or unspecified chronic kidney disease: Secondary | ICD-10-CM | POA: Diagnosis not present

## 2019-02-17 DIAGNOSIS — Z1339 Encounter for screening examination for other mental health and behavioral disorders: Secondary | ICD-10-CM | POA: Diagnosis not present

## 2019-02-17 DIAGNOSIS — E039 Hypothyroidism, unspecified: Secondary | ICD-10-CM | POA: Diagnosis not present

## 2019-02-17 DIAGNOSIS — E786 Lipoprotein deficiency: Secondary | ICD-10-CM | POA: Diagnosis not present

## 2019-02-17 DIAGNOSIS — Z0000 Encounter for general adult medical examination without abnormal findings: Secondary | ICD-10-CM | POA: Diagnosis not present

## 2019-03-06 ENCOUNTER — Ambulatory Visit (INDEPENDENT_AMBULATORY_CARE_PROVIDER_SITE_OTHER): Payer: Medicare Other | Admitting: Podiatry

## 2019-03-06 ENCOUNTER — Other Ambulatory Visit: Payer: Self-pay

## 2019-03-06 ENCOUNTER — Encounter: Payer: Self-pay | Admitting: Podiatry

## 2019-03-06 VITALS — Temp 97.9°F

## 2019-03-06 DIAGNOSIS — L601 Onycholysis: Secondary | ICD-10-CM

## 2019-03-06 DIAGNOSIS — E11621 Type 2 diabetes mellitus with foot ulcer: Secondary | ICD-10-CM

## 2019-03-06 DIAGNOSIS — L97529 Non-pressure chronic ulcer of other part of left foot with unspecified severity: Secondary | ICD-10-CM | POA: Diagnosis not present

## 2019-03-06 DIAGNOSIS — B351 Tinea unguium: Secondary | ICD-10-CM

## 2019-03-06 DIAGNOSIS — M79674 Pain in right toe(s): Secondary | ICD-10-CM

## 2019-03-06 DIAGNOSIS — M79675 Pain in left toe(s): Secondary | ICD-10-CM

## 2019-03-06 DIAGNOSIS — E1142 Type 2 diabetes mellitus with diabetic polyneuropathy: Secondary | ICD-10-CM

## 2019-03-06 NOTE — Patient Instructions (Signed)
Diabetes Mellitus and Foot Care Foot care is an important part of your health, especially when you have diabetes. Diabetes may cause you to have problems because of poor blood flow (circulation) to your feet and legs, which can cause your skin to:  Become thinner and drier.  Break more easily.  Heal more slowly.  Peel and crack. You may also have nerve damage (neuropathy) in your legs and feet, causing decreased feeling in them. This means that you may not notice minor injuries to your feet that could lead to more serious problems. Noticing and addressing any potential problems early is the best way to prevent future foot problems. How to care for your feet Foot hygiene  Wash your feet daily with warm water and mild soap. Do not use hot water. Then, pat your feet and the areas between your toes until they are completely dry. Do not soak your feet as this can dry your skin.  Trim your toenails straight across. Do not dig under them or around the cuticle. File the edges of your nails with an emery board or nail file.  Apply a moisturizing lotion or petroleum jelly to the skin on your feet and to dry, brittle toenails. Use lotion that does not contain alcohol and is unscented. Do not apply lotion between your toes. Shoes and socks  Wear clean socks or stockings every day. Make sure they are not too tight. Do not wear knee-high stockings since they may decrease blood flow to your legs.  Wear shoes that fit properly and have enough cushioning. Always look in your shoes before you put them on to be sure there are no objects inside.  To break in new shoes, wear them for just a few hours a day. This prevents injuries on your feet. Wounds, scrapes, corns, and calluses  Check your feet daily for blisters, cuts, bruises, sores, and redness. If you cannot see the bottom of your feet, use a mirror or ask someone for help.  Do not cut corns or calluses or try to remove them with medicine.  If you  find a minor scrape, cut, or break in the skin on your feet, keep it and the skin around it clean and dry. You may clean these areas with mild soap and water. Do not clean the area with peroxide, alcohol, or iodine.  If you have a wound, scrape, corn, or callus on your foot, look at it several times a day to make sure it is healing and not infected. Check for: ? Redness, swelling, or pain. ? Fluid or blood. ? Warmth. ? Pus or a bad smell. General instructions  Do not cross your legs. This may decrease blood flow to your feet.  Do not use heating pads or hot water bottles on your feet. They may burn your skin. If you have lost feeling in your feet or legs, you may not know this is happening until it is too late.  Protect your feet from hot and cold by wearing shoes, such as at the beach or on hot pavement.  Schedule a complete foot exam at least once a year (annually) or more often if you have foot problems. If you have foot problems, report any cuts, sores, or bruises to your health care provider immediately. Contact a health care provider if:  You have a medical condition that increases your risk of infection and you have any cuts, sores, or bruises on your feet.  You have an injury that is not   healing.  You have redness on your legs or feet.  You feel burning or tingling in your legs or feet.  You have pain or cramps in your legs and feet.  Your legs or feet are numb.  Your feet always feel cold.  You have pain around a toenail. Get help right away if:  You have a wound, scrape, corn, or callus on your foot and: ? You have pain, swelling, or redness that gets worse. ? You have fluid or blood coming from the wound, scrape, corn, or callus. ? Your wound, scrape, corn, or callus feels warm to the touch. ? You have pus or a bad smell coming from the wound, scrape, corn, or callus. ? You have a fever. ? You have a red line going up your leg. Summary  Check your feet every day  for cuts, sores, red spots, swelling, and blisters.  Moisturize feet and legs daily.  Wear shoes that fit properly and have enough cushioning.  If you have foot problems, report any cuts, sores, or bruises to your health care provider immediately.  Schedule a complete foot exam at least once a year (annually) or more often if you have foot problems. This information is not intended to replace advice given to you by your health care provider. Make sure you discuss any questions you have with your health care provider. Document Released: 08/31/2000 Document Revised: 10/16/2017 Document Reviewed: 10/05/2016 Elsevier Interactive Patient Education  2019 [employer]  Onychomycosis/Fungal Toenails  WHAT IS IT? An infection that lies within the keratin of your nail plate that is caused by a fungus.  WHY ME? Fungal infections affect all ages, sexes, races, and creeds.  There may be many factors that predispose you to a fungal infection such as age, coexisting medical conditions such as diabetes, or an autoimmune disease; stress, medications, fatigue, genetics, etc.  Bottom line: fungus thrives in a warm, moist environment and your shoes offer such a location.  IS IT CONTAGIOUS? Theoretically, yes.  You do not want to share shoes, nail clippers or files with someone who has fungal toenails.  Walking around barefoot in the same room or sleeping in the same bed is unlikely to transfer the organism.  It is important to realize, however, that fungus can spread easily from one nail to the next on the same foot.  HOW DO WE TREAT THIS?  There are several ways to treat this condition.  Treatment may depend on many factors such as age, medications, pregnancy, liver and kidney conditions, etc.  It is best to ask your doctor which options are available to you.  1. No treatment.   Unlike many other medical concerns, you can live with this condition.  However for many people this can be a painful condition and  may lead to ingrown toenails or a bacterial infection.  It is recommended that you keep the nails cut short to help reduce the amount of fungal nail. 2. Topical treatment.  These range from herbal remedies to prescription strength nail lacquers.  About 40-50% effective, topicals require twice daily application for approximately 9 to 12 months or until an entirely new nail has grown out.  The most effective topicals are medical grade medications available through physicians offices. 3. Oral antifungal medications.  With an 80-90% cure rate, the most common oral medication requires 3 to 4 months of therapy and stays in your system for a year as the new nail grows out.  Oral antifungal medications do require   blood work to make sure it is a safe drug for you.  A liver function panel will be performed prior to starting the medication and after the first month of treatment.  It is important to have the blood work performed to avoid any harmful side effects.  In general, this medication safe but blood work is required. 4. Laser Therapy.  This treatment is performed by applying a specialized laser to the affected nail plate.  This therapy is noninvasive, fast, and non-painful.  It is not covered by insurance and is therefore, out of pocket.  The results have been very good with a 80-95% cure rate.  [facility] is the only practice in the area to offer this therapy. 5. Permanent Nail Avulsion.  Removing the entire nail so that a new nail will not grow back. 

## 2019-03-09 ENCOUNTER — Telehealth: Payer: Self-pay | Admitting: Podiatry

## 2019-03-09 NOTE — Telephone Encounter (Signed)
pts wife called and left message Friday asking for a call back to get pt scheduled for diabetics shoes.   I left message for pt to call me back to schedule an appt.

## 2019-03-09 NOTE — Telephone Encounter (Signed)
Pt scheduled for 6.30.2020 to see Liliane Channel

## 2019-03-17 ENCOUNTER — Ambulatory Visit: Payer: Medicare Other | Admitting: Orthotics

## 2019-03-17 ENCOUNTER — Other Ambulatory Visit: Payer: Self-pay

## 2019-03-17 DIAGNOSIS — E1142 Type 2 diabetes mellitus with diabetic polyneuropathy: Secondary | ICD-10-CM

## 2019-03-17 DIAGNOSIS — L97529 Non-pressure chronic ulcer of other part of left foot with unspecified severity: Secondary | ICD-10-CM

## 2019-03-17 DIAGNOSIS — M79674 Pain in right toe(s): Secondary | ICD-10-CM

## 2019-03-17 DIAGNOSIS — B351 Tinea unguium: Secondary | ICD-10-CM

## 2019-03-17 DIAGNOSIS — E11621 Type 2 diabetes mellitus with foot ulcer: Secondary | ICD-10-CM

## 2019-03-17 NOTE — Progress Notes (Signed)
Subjective: Daniel Ochoa presents today with history of neuropathy. Also has h/o ulceration distal tip of left hallux. Patient seen today for chronic, mycotic toenails.   [name], MD is his PCP and last visit was 11/18/2018.  He thinks his left great toe is finally back to normal now. His wife continues to inspect it daily.   Current Outpatient Medications:  .  amLODipine (NORVASC) 5 MG tablet, Take 5 mg by mouth daily. , Disp: , Rfl:  .  amoxicillin (AMOXIL) 500 MG capsule, Take 1 capsule (500 mg total) by mouth 3 (three) times daily., Disp: 30 capsule, Rfl: 0 .  Ascorbic Acid (VITAMIN C) 1000 MG tablet, Take 1,000 mg by mouth daily., Disp: , Rfl:  .  atenolol (TENORMIN) 50 MG tablet, Take 50 mg by mouth 2 (two) times daily. , Disp: , Rfl:  .  Calcium Carb-Cholecalciferol (CALCIUM 600 + D PO), Take 1 tablet by mouth 2 (two) times daily., Disp: , Rfl:  .  clonazePAM (KLONOPIN) 1 MG tablet, Take 1 mg by mouth at bedtime. , Disp: , Rfl:  .  DYMISTA 137-50 MCG/ACT SUSP, Place 1 spray into both nostrils 2 (two) times daily. , Disp: , Rfl:  .  famotidine (PEPCID) 40 MG tablet, Take 40 mg by mouth at bedtime. , Disp: , Rfl:  .  FREESTYLE TEST STRIPS test strip, USE AS DIRECTED TO TEST BLOOD SUGAR 5 TIMES PER DAY. DX: E11.65, Disp: , Rfl: 11 .  furosemide (LASIX) 40 MG tablet, Take 40 mg by mouth 2 (two) times daily. , Disp: , Rfl:  .  HUMALOG 100 UNIT/ML injection, USE IN OMNIPOD DAILY (AVERAGE OF 110 UNITS/DAY) **E11.29, Disp: , Rfl: 2 .  HYDROcodone-acetaminophen (NORCO/VICODIN) 5-325 MG tablet, TAKE 1 TABLET BY MOUTH 4 TIMES DAILY AS NEEDED FOR MODERATE PAIN., Disp: , Rfl: 0 .  insulin regular human CONCENTRATED (HUMULIN R) 500 UNIT/ML injection, Inject into the skin. Pt uses with Insulin pump, Disp: , Rfl:  .  ipratropium (ATROVENT) 0.03 % nasal spray, USE 1 TO 2 SPRAY(S) IN EACH NOSTRIL THREE TIMES DAILY AS NEEDED FOR CONGESTION, Disp: , Rfl:  .  mupirocin cream (BACTROBAN) 2 %,  Apply 1 application topically 2 (two) times daily., Disp: 15 g, Rfl: 0 .  nystatin (MYCOSTATIN) 500000 units TABS tablet, Take 1 tablet by mouth daily as needed. Uses for heat rash, Disp: , Rfl:  .  oxyCODONE-acetaminophen (PERCOCET/ROXICET) 5-325 MG tablet, Take 1 tablet by mouth every 6 (six) hours as needed., Disp: 15 tablet, Rfl: 0 .  SYNTHROID 200 MCG tablet, Take 200 mcg by mouth See admin instructions. Takes the 200 mcg daily except on Sundays, pt takes 300 mcg (or 1.5 tablets), Disp: , Rfl:   Allergies  Allergen Reactions  . Ace Inhibitors Shortness Of Breath    choking  . Statins Other (See Comments)    Elevated CK level  . Latex Rash    Severe blistery rash  . Sudafed [Pseudoephedrine Hcl] Rash    Objective: Vitals:   03/06/19 1321  Temp: 97.9 F (36.6 C)    Vascular Examination: Capillary refill time <3 seconds x 10 digits.  Dorsalis pedis pulses present b/l.  Posterior tibial pulses present b/l.  No digital hair x 10 digits.  Skin temperature WNL b/l.  No ischemic/gangrenous changes noted b/l.  Dermatological Examination: Skin is thin, shiny and atrophic b/l.  Toenails 1-5 right, 2-5 left discolored, thick, dystrophic with subungual debris and pain with palpation to nailbeds due to  thickness of nails.  Left great toe nailbed with onycholysis of remaining nail plate with light bleeding. No erythema, no edema, no purulence, no flocculence of nailbed.  Musculoskeletal: Muscle strength 5/5 to all LE muscle groups.  HAV with bunion b/l.  Hammertoes 2-5 b/l.  Neurological: Sensation diminished with 10 gram monofilament.  Vibratory sensation diminished b/l.  Assessment: 1. Onychomycosis toenails 1-5 right, 2-5 left 2. Onycholysis of nail plate left hallux 3. Healed diabetic ulcer left hallux 4. NIDDM with neuropathy  Plan: 1. Toenails 1-5 right, 2-5 left were debrided in length and girth without iatrogenic bleeding. 2. Remaining nailplate left  hallux gently debrided. Lumicain Hemostatic Solution applied. Cleansed with alcohol. TAO and band-aid applied. Patient instructed to remove band-aid on tomorrow. Apply antibiotic ointment to nailbed once daily for one week. 3. Patient to continue soft, supportive shoe gear daily. He may now schedule appointment to get measured for his new diabetic shoes. 4. Patient to report any pedal injuries to medical professional immediately. 5. Follow up 3 months.  6. Patient/POA to call should there be a concern in the interim.

## 2019-03-17 NOTE — Progress Notes (Signed)

## 2019-03-31 DIAGNOSIS — Z4681 Encounter for fitting and adjustment of insulin pump: Secondary | ICD-10-CM | POA: Diagnosis not present

## 2019-03-31 DIAGNOSIS — I129 Hypertensive chronic kidney disease with stage 1 through stage 4 chronic kidney disease, or unspecified chronic kidney disease: Secondary | ICD-10-CM | POA: Diagnosis not present

## 2019-03-31 DIAGNOSIS — E1129 Type 2 diabetes mellitus with other diabetic kidney complication: Secondary | ICD-10-CM | POA: Diagnosis not present

## 2019-03-31 DIAGNOSIS — N184 Chronic kidney disease, stage 4 (severe): Secondary | ICD-10-CM | POA: Diagnosis not present

## 2019-03-31 DIAGNOSIS — Z794 Long term (current) use of insulin: Secondary | ICD-10-CM | POA: Diagnosis not present

## 2019-04-14 ENCOUNTER — Ambulatory Visit (INDEPENDENT_AMBULATORY_CARE_PROVIDER_SITE_OTHER): Payer: Medicare Other | Admitting: Orthotics

## 2019-04-14 ENCOUNTER — Other Ambulatory Visit: Payer: Self-pay

## 2019-04-14 DIAGNOSIS — L84 Corns and callosities: Secondary | ICD-10-CM | POA: Diagnosis not present

## 2019-04-14 DIAGNOSIS — M79675 Pain in left toe(s): Secondary | ICD-10-CM

## 2019-04-14 DIAGNOSIS — L97529 Non-pressure chronic ulcer of other part of left foot with unspecified severity: Secondary | ICD-10-CM | POA: Diagnosis not present

## 2019-04-14 DIAGNOSIS — M79674 Pain in right toe(s): Secondary | ICD-10-CM

## 2019-04-14 DIAGNOSIS — E1142 Type 2 diabetes mellitus with diabetic polyneuropathy: Secondary | ICD-10-CM

## 2019-04-14 DIAGNOSIS — M2012 Hallux valgus (acquired), left foot: Secondary | ICD-10-CM

## 2019-04-14 DIAGNOSIS — E11621 Type 2 diabetes mellitus with foot ulcer: Secondary | ICD-10-CM

## 2019-04-14 DIAGNOSIS — M2011 Hallux valgus (acquired), right foot: Secondary | ICD-10-CM

## 2019-04-14 DIAGNOSIS — B351 Tinea unguium: Secondary | ICD-10-CM

## 2019-04-14 NOTE — Progress Notes (Signed)

## 2019-05-04 DIAGNOSIS — G4733 Obstructive sleep apnea (adult) (pediatric): Secondary | ICD-10-CM | POA: Diagnosis not present

## 2019-05-05 DIAGNOSIS — N189 Chronic kidney disease, unspecified: Secondary | ICD-10-CM | POA: Diagnosis not present

## 2019-05-05 DIAGNOSIS — N184 Chronic kidney disease, stage 4 (severe): Secondary | ICD-10-CM | POA: Diagnosis not present

## 2019-05-08 ENCOUNTER — Other Ambulatory Visit: Payer: Self-pay

## 2019-05-08 ENCOUNTER — Ambulatory Visit (INDEPENDENT_AMBULATORY_CARE_PROVIDER_SITE_OTHER): Payer: Medicare Other | Admitting: Podiatry

## 2019-05-08 ENCOUNTER — Encounter: Payer: Self-pay | Admitting: Podiatry

## 2019-05-08 VITALS — Temp 98.2°F

## 2019-05-08 DIAGNOSIS — M79674 Pain in right toe(s): Secondary | ICD-10-CM

## 2019-05-08 DIAGNOSIS — E1142 Type 2 diabetes mellitus with diabetic polyneuropathy: Secondary | ICD-10-CM

## 2019-05-08 DIAGNOSIS — B351 Tinea unguium: Secondary | ICD-10-CM | POA: Diagnosis not present

## 2019-05-08 DIAGNOSIS — M79675 Pain in left toe(s): Secondary | ICD-10-CM | POA: Diagnosis not present

## 2019-05-08 NOTE — Patient Instructions (Signed)
Diabetes Mellitus and Foot Care Foot care is an important part of your health, especially when you have diabetes. Diabetes may cause you to have problems because of poor blood flow (circulation) to your feet and legs, which can cause your skin to:  Become thinner and drier.  Break more easily.  Heal more slowly.  Peel and crack. You may also have nerve damage (neuropathy) in your legs and feet, causing decreased feeling in them. This means that you may not notice minor injuries to your feet that could lead to more serious problems. Noticing and addressing any potential problems early is the best way to prevent future foot problems. How to care for your feet Foot hygiene  Wash your feet daily with warm water and mild soap. Do not use hot water. Then, pat your feet and the areas between your toes until they are completely dry. Do not soak your feet as this can dry your skin.  Trim your toenails straight across. Do not dig under them or around the cuticle. File the edges of your nails with an emery board or nail file.  Apply a moisturizing lotion or petroleum jelly to the skin on your feet and to dry, brittle toenails. Use lotion that does not contain alcohol and is unscented. Do not apply lotion between your toes. Shoes and socks  Wear clean socks or stockings every day. Make sure they are not too tight. Do not wear knee-high stockings since they may decrease blood flow to your legs.  Wear shoes that fit properly and have enough cushioning. Always look in your shoes before you put them on to be sure there are no objects inside.  To break in new shoes, wear them for just a few hours a day. This prevents injuries on your feet. Wounds, scrapes, corns, and calluses  Check your feet daily for blisters, cuts, bruises, sores, and redness. If you cannot see the bottom of your feet, use a mirror or ask someone for help.  Do not cut corns or calluses or try to remove them with medicine.  If you  find a minor scrape, cut, or break in the skin on your feet, keep it and the skin around it clean and dry. You may clean these areas with mild soap and water. Do not clean the area with peroxide, alcohol, or iodine.  If you have a wound, scrape, corn, or callus on your foot, look at it several times a day to make sure it is healing and not infected. Check for: ? Redness, swelling, or pain. ? Fluid or blood. ? Warmth. ? Pus or a bad smell. General instructions  Do not cross your legs. This may decrease blood flow to your feet.  Do not use heating pads or hot water bottles on your feet. They may burn your skin. If you have lost feeling in your feet or legs, you may not know this is happening until it is too late.  Protect your feet from hot and cold by wearing shoes, such as at the beach or on hot pavement.  Schedule a complete foot exam at least once a year (annually) or more often if you have foot problems. If you have foot problems, report any cuts, sores, or bruises to your health care provider immediately. Contact a health care provider if:  You have a medical condition that increases your risk of infection and you have any cuts, sores, or bruises on your feet.  You have an injury that is not   healing.  You have redness on your legs or feet.  You feel burning or tingling in your legs or feet.  You have pain or cramps in your legs and feet.  Your legs or feet are numb.  Your feet always feel cold.  You have pain around a toenail. Get help right away if:  You have a wound, scrape, corn, or callus on your foot and: ? You have pain, swelling, or redness that gets worse. ? You have fluid or blood coming from the wound, scrape, corn, or callus. ? Your wound, scrape, corn, or callus feels warm to the touch. ? You have pus or a bad smell coming from the wound, scrape, corn, or callus. ? You have a fever. ? You have a red line going up your leg. Summary  Check your feet every day  for cuts, sores, red spots, swelling, and blisters.  Moisturize feet and legs daily.  Wear shoes that fit properly and have enough cushioning.  If you have foot problems, report any cuts, sores, or bruises to your health care provider immediately.  Schedule a complete foot exam at least once a year (annually) or more often if you have foot problems. This information is not intended to replace advice given to you by your health care provider. Make sure you discuss any questions you have with your health care provider. Document Released: 08/31/2000 Document Revised: 10/16/2017 Document Reviewed: 10/05/2016 Elsevier Patient Education  2020 [employer]   Onychomycosis/Fungal Toenails  WHAT IS IT? An infection that lies within the keratin of your nail plate that is caused by a fungus.  WHY ME? Fungal infections affect all ages, sexes, races, and creeds.  There may be many factors that predispose you to a fungal infection such as age, coexisting medical conditions such as diabetes, or an autoimmune disease; stress, medications, fatigue, genetics, etc.  Bottom line: fungus thrives in a warm, moist environment and your shoes offer such a location.  IS IT CONTAGIOUS? Theoretically, yes.  You do not want to share shoes, nail clippers or files with someone who has fungal toenails.  Walking around barefoot in the same room or sleeping in the same bed is unlikely to transfer the organism.  It is important to realize, however, that fungus can spread easily from one nail to the next on the same foot.  HOW DO WE TREAT THIS?  There are several ways to treat this condition.  Treatment may depend on many factors such as age, medications, pregnancy, liver and kidney conditions, etc.  It is best to ask your doctor which options are available to you.  1. No treatment.   Unlike many other medical concerns, you can live with this condition.  However for many people this can be a painful condition and may lead to  ingrown toenails or a bacterial infection.  It is recommended that you keep the nails cut short to help reduce the amount of fungal nail. 2. Topical treatment.  These range from herbal remedies to prescription strength nail lacquers.  About 40-50% effective, topicals require twice daily application for approximately 9 to 12 months or until an entirely new nail has grown out.  The most effective topicals are medical grade medications available through physicians offices. 3. Oral antifungal medications.  With an 80-90% cure rate, the most common oral medication requires 3 to 4 months of therapy and stays in your system for a year as the new nail grows out.  Oral antifungal medications do require   blood work to make sure it is a safe drug for you.  A liver function panel will be performed prior to starting the medication and after the first month of treatment.  It is important to have the blood work performed to avoid any harmful side effects.  In general, this medication safe but blood work is required. 4. Laser Therapy.  This treatment is performed by applying a specialized laser to the affected nail plate.  This therapy is noninvasive, fast, and non-painful.  It is not covered by insurance and is therefore, out of pocket.  The results have been very good with a 80-95% cure rate.  [facility] is the only practice in the area to offer this therapy. 5. Permanent Nail Avulsion.  Removing the entire nail so that a new nail will not grow back. 

## 2019-05-11 DIAGNOSIS — Z6841 Body Mass Index (BMI) 40.0 and over, adult: Secondary | ICD-10-CM | POA: Diagnosis not present

## 2019-05-11 DIAGNOSIS — D631 Anemia in chronic kidney disease: Secondary | ICD-10-CM | POA: Diagnosis not present

## 2019-05-11 DIAGNOSIS — N184 Chronic kidney disease, stage 4 (severe): Secondary | ICD-10-CM | POA: Diagnosis not present

## 2019-05-11 DIAGNOSIS — G4730 Sleep apnea, unspecified: Secondary | ICD-10-CM | POA: Diagnosis not present

## 2019-05-11 DIAGNOSIS — E1122 Type 2 diabetes mellitus with diabetic chronic kidney disease: Secondary | ICD-10-CM | POA: Diagnosis not present

## 2019-05-11 DIAGNOSIS — I129 Hypertensive chronic kidney disease with stage 1 through stage 4 chronic kidney disease, or unspecified chronic kidney disease: Secondary | ICD-10-CM | POA: Diagnosis not present

## 2019-05-15 ENCOUNTER — Other Ambulatory Visit: Payer: Self-pay

## 2019-05-15 ENCOUNTER — Inpatient Hospital Stay (HOSPITAL_COMMUNITY): Admission: RE | Admit: 2019-05-15 | Payer: Medicare Other | Source: Ambulatory Visit

## 2019-05-15 DIAGNOSIS — R6889 Other general symptoms and signs: Secondary | ICD-10-CM | POA: Diagnosis not present

## 2019-05-15 DIAGNOSIS — Z20822 Contact with and (suspected) exposure to covid-19: Secondary | ICD-10-CM

## 2019-05-16 ENCOUNTER — Other Ambulatory Visit (HOSPITAL_COMMUNITY)
Admission: RE | Admit: 2019-05-16 | Discharge: 2019-05-16 | Disposition: A | Discharge status: Home / Self Care | Payer: Medicare Other | Source: Ambulatory Visit | Attending: Internal Medicine | Admitting: Internal Medicine

## 2019-05-16 DIAGNOSIS — Z20828 Contact with and (suspected) exposure to other viral communicable diseases: Secondary | ICD-10-CM | POA: Insufficient documentation

## 2019-05-16 DIAGNOSIS — Z01812 Encounter for preprocedural laboratory examination: Secondary | ICD-10-CM | POA: Insufficient documentation

## 2019-05-16 LAB — SARS CORONAVIRUS 2 (TAT 6-24 HRS): SARS Coronavirus 2: NEGATIVE

## 2019-05-16 LAB — NOVEL CORONAVIRUS, NAA: SARS-CoV-2, NAA: NOT DETECTED

## 2019-05-18 ENCOUNTER — Ambulatory Visit (HOSPITAL_BASED_OUTPATIENT_CLINIC_OR_DEPARTMENT_OTHER): Payer: Medicare Other | Attending: Internal Medicine | Admitting: Internal Medicine

## 2019-05-18 ENCOUNTER — Other Ambulatory Visit: Payer: Self-pay

## 2019-05-18 VITALS — Ht 73.0 in | Wt 277.0 lb

## 2019-05-18 DIAGNOSIS — I493 Ventricular premature depolarization: Secondary | ICD-10-CM | POA: Diagnosis not present

## 2019-05-18 DIAGNOSIS — I4891 Unspecified atrial fibrillation: Secondary | ICD-10-CM | POA: Insufficient documentation

## 2019-05-18 DIAGNOSIS — G4710 Hypersomnia, unspecified: Secondary | ICD-10-CM | POA: Diagnosis not present

## 2019-05-18 DIAGNOSIS — R0683 Snoring: Secondary | ICD-10-CM

## 2019-05-18 DIAGNOSIS — G4733 Obstructive sleep apnea (adult) (pediatric): Secondary | ICD-10-CM | POA: Diagnosis not present

## 2019-05-18 NOTE — Progress Notes (Signed)
Subjective:  Daniel Ochoa presents to clinic today for preventative diabetic foot care with cc of  painful, thick, discolored, elongated toenails 1-5 b/l that become tender and cannot cut because of thickness. Pain is aggravated when wearing enclosed shoe gear.  [name], MD is his PCP.   He has h/o ulceration left hallux which has healed.  He voices no new pedal complaints on today's visit.  Wife continues to inspect his feet daily.   Allergies  Allergen Reactions  . Ace Inhibitors Shortness Of Breath    choking  . Statins Other (See Comments)    Elevated CK level  . Latex Rash    Severe blistery rash  . Sudafed [Pseudoephedrine Hcl] Rash     Objective: Vitals:   05/08/19 1345  Temp: 98.2 F (36.8 C)    Physical Examination:  Vascular Examination: Capillary refill time <3 seconds x 10 digits.  Palpable DP/PT pulses b/l.  Digital hair absent b/l.  No ischemia noted b/l.  No edema noted b/l.  Skin temperature gradient WNL b/l.  Dermatological Examination: Skin is thin, shiny and atrophic b/l.  No open wounds b/l.  No interdigital macerations noted b/l.  Elongated, thick, discolored brittle toenails with subungual debris and pain on dorsal palpation of nailbeds 1-5 right, 2-5 left.  Anonychia left great toe(s).  Nailbed(s) completely epithelialized and intact.  Musculoskeletal Examination: Muscle strength 5/5 to all muscle groups b/l.  HAV with bunion b/l. Hammertoes 2-5 b/l.  No pain, crepitus or joint discomfort with active/passive ROM.  Neurological Examination: Sensation diminished b/l with 10 gram monofilament.  Vibratory sensation diminished b/l.  Assessment: Mycotic nail infection with pain 1-5 b/l NIDDM with neuropathy  Plan: 1. Toenails 1-5 right, 2-5 left were debrided in length and girth without iatrogenic laceration. 2.  Continue soft, supportive shoe gear daily. 3.  Report any pedal injuries to medical  professional. 4.  Follow up 9 weeks. 5.  Patient/POA to call should there be a question/concern in there interim.

## 2019-05-25 DIAGNOSIS — G4733 Obstructive sleep apnea (adult) (pediatric): Secondary | ICD-10-CM | POA: Diagnosis not present

## 2019-05-25 NOTE — Procedures (Signed)
NAME: Daniel Ochoa DATE OF BIRTH:  08/31/1941 MEDICAL RECORD NUMBER [id number]  LOCATION: [facility]  PHYSICIAN: Marius Ditch  DATE OF STUDY: 05/18/2019  SLEEP STUDY TYPE: Positive Airway Pressure Titration               REFERRING PHYSICIAN: [name], MD  INDICATION FOR STUDY: Inadequate airway control with auto adjusting CPAP at home; patient has moderate OSA.   EPWORTH SLEEPINESS SCORE:  NA HEIGHT: 6\' 1"  (185.4 cm)  WEIGHT: 277 lb (125.6 kg)    Body mass index is 36.55 kg/m.  NECK SIZE: 18 in.  MEDICATIONS  Patient self administered medications include: N/A. Medications administered during study include No sleep medicine administered.Marland Kitchen   SLEEP STUDY TECHNIQUE  The patient underwent an attended overnight polysomnography titration to assess the effects of cpap therapy. The following variables were monitored: EEG(C4-A1, C3-A2, O1-A2, O2-A1), EOG, submental and leg EMG, ECG, oxyhemoglobin saturation by pulse oximetry, thoracic and abdominal respiratory effort belts, nasal/oral airflow by pressure sensor, body position sensor and snoring sensor. CPAP pressure was titrated to eliminate apneas, hypopneas and oxygen desaturation.   TECHNICAL COMMENTS  Comments added by Technician: Patient was restless all through the night. Patient sleep in a recliner chair. Comments added by Scorer: N/A   SLEEP ARCHITECTURE  The study was initiated at 10:29:42 PM and terminated at 4:33:02 AM. Total recorded time was 363.3 minutes. EEG confirmed total sleep time was 90.5 minutes yielding a sleep efficiency of 24.9%%. Sleep onset after lights out was 4.2 minutes with a REM latency of 162.0 minutes. The patient spent 60.8%% of the night in stage N1 sleep, 19.3%% in stage N2 sleep, 0.0%% in stage N3 and 19.9% in REM. The Arousal Index was 41.1/hour.   RESPIRATORY PARAMETERS  The overall AHI was 32.5 per hour, and the RDI was 39.8 events/hour with a central apnea  index of 0.0per hour. The most appropriate setting of CPAP was IPAP/EPAP 18/18 cm H2O, but this did not result in adeuqate airway control. At this setting, the sleep efficiency was 13% and the patient was supine for 0%. The AHI was 22.1 events per hour, and the RDI was 29.4 events/hour (with 0.0 central events) and the arousal index was 4.9 per hour.The oxygen nadir was 91.0% during sleep.  LEG MOVEMENT DATA  The total leg movements were 0 with a resulting leg movement index of 0.0/hr. Associated arousal with leg movement index was 0.0/hr.   CARDIAC DATA  The underlying cardiac rhythm was most consistent with sinus rhythm. Mean heart rate during sleep was 55.6 bpm. Additional rhythm abnormalities include Atrial Fibrillation, PVCs.   IMPRESSIONS  Inadequate PAP titration.  Electrocardiographic data showed presence of Atrial Fibrillation, PVCs. The patient snored with soft snoring volume. Mild Oxygen Desaturation. Despite inadequate PAP titration, the patient's O2 sats were markedly better than on his diagnostic HSAT.  Severe Obstructive Sleep apnea(OSA)  No Significant Central Sleep Apnea (CSA) The patient refused to sleep in the bed and slept in the recliner all night. Severe leak was noted due to mouth opening. No attempt to use a chinstrap was documented tech notes.   DIAGNOSIS  Obstructive Sleep Apnea (327.23 [G47.33 ICD-10])  RECOMMENDATIONS  Consider use of chinstrap to try to keep patient's mouth closed despite sleeping in a recliner.    Marius Ditch Sleep specialist, [place] Board of Internal Medicine  ELECTRONICALLY SIGNED ON:  05/25/2019, 8:37 PM [place] PH: [phone]   FX: [phone] ACCREDITED BY THE [employer]ICAN [employer] OF SLEEP MEDICINE

## 2019-05-28 ENCOUNTER — Telehealth: Payer: Self-pay

## 2019-05-28 NOTE — Telephone Encounter (Signed)
NOTES ON FILE FROM [facility] [phone], SENT REFERRAL TO SCHEDULING 

## 2019-05-29 DIAGNOSIS — Z23 Encounter for immunization: Secondary | ICD-10-CM | POA: Diagnosis not present

## 2019-07-02 DIAGNOSIS — N184 Chronic kidney disease, stage 4 (severe): Secondary | ICD-10-CM | POA: Diagnosis not present

## 2019-07-02 DIAGNOSIS — E1129 Type 2 diabetes mellitus with other diabetic kidney complication: Secondary | ICD-10-CM | POA: Diagnosis not present

## 2019-07-02 DIAGNOSIS — Z4681 Encounter for fitting and adjustment of insulin pump: Secondary | ICD-10-CM | POA: Diagnosis not present

## 2019-07-02 DIAGNOSIS — I129 Hypertensive chronic kidney disease with stage 1 through stage 4 chronic kidney disease, or unspecified chronic kidney disease: Secondary | ICD-10-CM | POA: Diagnosis not present

## 2019-07-02 DIAGNOSIS — Z794 Long term (current) use of insulin: Secondary | ICD-10-CM | POA: Diagnosis not present

## 2019-07-15 ENCOUNTER — Ambulatory Visit: Payer: Medicare Other | Admitting: Cardiology

## 2019-07-30 ENCOUNTER — Ambulatory Visit: Payer: Medicare Other | Admitting: Cardiology

## 2019-08-04 DIAGNOSIS — N184 Chronic kidney disease, stage 4 (severe): Secondary | ICD-10-CM | POA: Diagnosis not present

## 2019-08-07 ENCOUNTER — Encounter: Payer: Self-pay | Admitting: Podiatry

## 2019-08-07 ENCOUNTER — Ambulatory Visit (INDEPENDENT_AMBULATORY_CARE_PROVIDER_SITE_OTHER): Payer: Medicare Other | Admitting: Podiatry

## 2019-08-07 ENCOUNTER — Other Ambulatory Visit: Payer: Self-pay

## 2019-08-07 DIAGNOSIS — M79675 Pain in left toe(s): Secondary | ICD-10-CM

## 2019-08-07 DIAGNOSIS — M79674 Pain in right toe(s): Secondary | ICD-10-CM

## 2019-08-07 DIAGNOSIS — E1142 Type 2 diabetes mellitus with diabetic polyneuropathy: Secondary | ICD-10-CM

## 2019-08-07 DIAGNOSIS — L601 Onycholysis: Secondary | ICD-10-CM | POA: Diagnosis not present

## 2019-08-07 DIAGNOSIS — B351 Tinea unguium: Secondary | ICD-10-CM

## 2019-08-07 MED ORDER — MUPIROCIN CALCIUM 2 % EX CREA: 1.0000 "application " | TOPICAL_CREAM | Freq: Every day | CUTANEOUS | 0 refills | Status: DC

## 2019-08-07 NOTE — Patient Instructions (Addendum)
EPSOM SALT FOOT SOAK INSTRUCTIONS  1.  Place 1/4 cup of epsom salts in 2 quarts of warm tap water. IF YOU ARE DIABETIC, OR HAVE NEUROPATHY,  CHECK THE TEMPERATURE OF THE WATER WITH YOUR ELBOW.  2.  Submerge your foot/feet in the solution and soak for 20 minutes.      3.  Next, remove your foot or feet from solution, blot dry the affected area.    4.  Apply antibiotic ointment and cover with fabric band-aid .  5.  This soak should be done once a day for 3 days.   6.  Monitor for any signs/symptoms of infection such as redness, swelling, odor, drainage, increased pain, or non-healing of digit.   7.  Please do not hesitate to call the office and speak to a Nurse or Doctor if you have questions.   8.  If you experience fever, chills, nightsweats, nausea or vomiting with worsening of digit, please go to the emergency room.   Apply antibiotic ointment to the right great toe after soaking

## 2019-08-11 DIAGNOSIS — G8929 Other chronic pain: Secondary | ICD-10-CM | POA: Diagnosis not present

## 2019-08-11 DIAGNOSIS — Z6841 Body Mass Index (BMI) 40.0 and over, adult: Secondary | ICD-10-CM | POA: Diagnosis not present

## 2019-08-11 DIAGNOSIS — G4730 Sleep apnea, unspecified: Secondary | ICD-10-CM | POA: Diagnosis not present

## 2019-08-11 DIAGNOSIS — E1122 Type 2 diabetes mellitus with diabetic chronic kidney disease: Secondary | ICD-10-CM | POA: Diagnosis not present

## 2019-08-11 DIAGNOSIS — D631 Anemia in chronic kidney disease: Secondary | ICD-10-CM | POA: Diagnosis not present

## 2019-08-11 DIAGNOSIS — N184 Chronic kidney disease, stage 4 (severe): Secondary | ICD-10-CM | POA: Diagnosis not present

## 2019-08-11 DIAGNOSIS — I129 Hypertensive chronic kidney disease with stage 1 through stage 4 chronic kidney disease, or unspecified chronic kidney disease: Secondary | ICD-10-CM | POA: Diagnosis not present

## 2019-08-15 NOTE — Progress Notes (Signed)
Subjective: Daniel Ochoa is seen today for preventative diabetic foot care follow up of elongated, thickened toenails bilateral feet that he cannot cut. Pain interferes with daily activities. Aggravating factor includes wearing enclosed shoe gear and relieved with periodic debridement.  His wife is present during the visit. She states the left great toe looks good and now right great toe appears to be irritated at distal tip. Denies any trauma to area, but Mr.Amores does have neuropathy and loss of feeling in his feet.  Medications reviewed in chart.  Allergies  Allergen Reactions  . Ace Inhibitors Shortness Of Breath    choking  . Statins Other (See Comments)    Elevated CK level  . Latex Rash    Severe blistery rash  . Sudafed [Pseudoephedrine Hcl] Rash    Objective:  Vascular Examination: Capillary refill time <3 seconds b/l.   Dorsalis pedis present b/l.  Posterior tibial pulses present b/l.  Digital hair absent b/l.  Skin temperature gradient WNL b/l.   Dermatological Examination: Skin thin, shiny and atrophic b/l.  Toenails 2-5 b/l discolored, thick, dystrophic with subungual debris and pain with palpation to nailbeds due to thickness of nails.  There is noted onchyolysis of entire nailplate of the right hallux. The nailbed remains intact. Light bleeding with removal of remaining attachment to border. There is no erythema, no edema, no drainage, no underlying flocculence.  Anonychia left great toe.  Nailbed completely epithelialized and intact.  Musculoskeletal: Muscle strength 5/5 to all LE muscle groups b/l.  HAV with bunion b/l.  Hammertoes lesser digits b/l.  No pain, crepitus or joint limitation noted with ROM.   Neurological Examination: Protective sensation diminished with 10 gram monofilament bilaterally.  Assessment: Painful onychomycosis toenails 2-5 b/l Onycholysis right hallux NIDDM with neuropathy  Plan: 1. Toenails 2-5 b/l were debrided  in length and girth without iatrogenic bleeding. Gently removed remaining nailplate from its attachment to digit. Light bleeding addressed with Lumicaine Hemostatic Solution. Antibiotic ointment and light dressing applied. Patient given written instructions for epsom salt soaks once daily for 3 days. Call office if condition does not resolve.  2. Refilled prescription for Mupirocin Ointment to be applied to toe after soaks.  3. Patient to continue soft, supportive shoe gear daily. 4. Patient to report any pedal injuries to medical professional immediately. 5. Follow up 3 months.  6. Patient/POA to call should there be a concern in the interim.

## 2019-08-17 DIAGNOSIS — I872 Venous insufficiency (chronic) (peripheral): Secondary | ICD-10-CM | POA: Diagnosis not present

## 2019-08-17 DIAGNOSIS — M545 Low back pain: Secondary | ICD-10-CM | POA: Diagnosis not present

## 2019-08-17 DIAGNOSIS — I129 Hypertensive chronic kidney disease with stage 1 through stage 4 chronic kidney disease, or unspecified chronic kidney disease: Secondary | ICD-10-CM | POA: Diagnosis not present

## 2019-08-17 DIAGNOSIS — E039 Hypothyroidism, unspecified: Secondary | ICD-10-CM | POA: Diagnosis not present

## 2019-08-17 DIAGNOSIS — E1129 Type 2 diabetes mellitus with other diabetic kidney complication: Secondary | ICD-10-CM | POA: Diagnosis not present

## 2019-08-17 DIAGNOSIS — N184 Chronic kidney disease, stage 4 (severe): Secondary | ICD-10-CM | POA: Diagnosis not present

## 2019-08-17 DIAGNOSIS — G4733 Obstructive sleep apnea (adult) (pediatric): Secondary | ICD-10-CM | POA: Diagnosis not present

## 2019-10-04 ENCOUNTER — Encounter: Payer: Self-pay | Admitting: Podiatry

## 2019-10-08 ENCOUNTER — Ambulatory Visit: Payer: Medicare Other | Attending: Internal Medicine

## 2019-10-08 DIAGNOSIS — Z23 Encounter for immunization: Secondary | ICD-10-CM | POA: Insufficient documentation

## 2019-10-08 NOTE — Progress Notes (Signed)
   Covid-19 Vaccination Clinic  Name:  Daniel Ochoa    MRN: [id number] DOB: 05/15/1941  10/08/2019  Daniel Ochoa was observed post Covid-19 immunization for 15 minutes without incidence. He was provided with Vaccine Information Sheet and instruction to access the V-Safe system.   Daniel Ochoa was instructed to call 911 with any severe reactions post vaccine: Marland Kitchen Difficulty breathing  . Swelling of your face and throat  . A fast heartbeat  . A bad rash all over your body  . Dizziness and weakness    Immunizations Administered    Name Date Dose VIS Date Route   Pfizer COVID-19 Vaccine 10/08/2019 12:24 PM 0.3 mL 08/28/2019 Intramuscular   Manufacturer: [place]   Lot: [id number]   [place]: [id number]

## 2019-10-21 DIAGNOSIS — Z961 Presence of intraocular lens: Secondary | ICD-10-CM | POA: Diagnosis not present

## 2019-10-21 DIAGNOSIS — H353131 Nonexudative age-related macular degeneration, bilateral, early dry stage: Secondary | ICD-10-CM | POA: Diagnosis not present

## 2019-10-21 DIAGNOSIS — H04123 Dry eye syndrome of bilateral lacrimal glands: Secondary | ICD-10-CM | POA: Diagnosis not present

## 2019-10-21 DIAGNOSIS — H5052 Exophoria: Secondary | ICD-10-CM | POA: Diagnosis not present

## 2019-10-21 DIAGNOSIS — E119 Type 2 diabetes mellitus without complications: Secondary | ICD-10-CM | POA: Diagnosis not present

## 2019-10-21 DIAGNOSIS — H5022 Vertical strabismus, left eye: Secondary | ICD-10-CM | POA: Diagnosis not present

## 2019-10-21 DIAGNOSIS — H2512 Age-related nuclear cataract, left eye: Secondary | ICD-10-CM | POA: Diagnosis not present

## 2019-10-29 ENCOUNTER — Ambulatory Visit: Payer: Medicare Other | Attending: Internal Medicine

## 2019-10-29 DIAGNOSIS — Z23 Encounter for immunization: Secondary | ICD-10-CM | POA: Insufficient documentation

## 2019-10-29 NOTE — Progress Notes (Signed)
   Covid-19 Vaccination Clinic  Name:  Daniel Ochoa    MRN: [id number] DOB: March 07, 1941  10/29/2019  Daniel Ochoa was observed post Covid-19 immunization for 15 minutes without incidence. He was provided with Vaccine Information Sheet and instruction to access the V-Safe system.   Daniel Ochoa was instructed to call 911 with any severe reactions post vaccine: Marland Kitchen Difficulty breathing  . Swelling of your face and throat  . A fast heartbeat  . A bad rash all over your body  . Dizziness and weakness    Immunizations Administered    Name Date Dose VIS Date Route   Pfizer COVID-19 Vaccine 10/29/2019  1:01 PM 0.3 mL 08/28/2019 Intramuscular   Manufacturer: [place]   Lot: [id number]   [place]: [id number]

## 2019-10-30 DIAGNOSIS — N184 Chronic kidney disease, stage 4 (severe): Secondary | ICD-10-CM | POA: Diagnosis not present

## 2019-11-06 ENCOUNTER — Encounter: Payer: Self-pay | Admitting: Podiatry

## 2019-11-06 ENCOUNTER — Other Ambulatory Visit: Payer: Self-pay

## 2019-11-06 ENCOUNTER — Ambulatory Visit (INDEPENDENT_AMBULATORY_CARE_PROVIDER_SITE_OTHER): Payer: Medicare Other | Admitting: Podiatry

## 2019-11-06 DIAGNOSIS — L601 Onycholysis: Secondary | ICD-10-CM | POA: Diagnosis not present

## 2019-11-06 DIAGNOSIS — E1142 Type 2 diabetes mellitus with diabetic polyneuropathy: Secondary | ICD-10-CM

## 2019-11-06 DIAGNOSIS — B351 Tinea unguium: Secondary | ICD-10-CM | POA: Diagnosis not present

## 2019-11-06 DIAGNOSIS — L600 Ingrowing nail: Secondary | ICD-10-CM | POA: Diagnosis not present

## 2019-11-06 NOTE — Patient Instructions (Addendum)
Apply antibiotic ointment to left 2nd toe once daily for one week for ingrown toenail.  Apply Vaseline Petroleum Jelly or Aquaphor Ointment to the nailbeds of both great toes.   Peripheral Neuropathy Peripheral neuropathy is a type of nerve damage. It affects nerves that carry signals between the spinal cord and the arms, legs, and the rest of the body (peripheral nerves). It does not affect nerves in the spinal cord or brain. In peripheral neuropathy, one nerve or a group of nerves may be damaged. Peripheral neuropathy is a broad category that includes many specific nerve disorders, like diabetic neuropathy, hereditary neuropathy, and carpal tunnel syndrome. What are the causes? This condition may be caused by:  Diabetes. This is the most common cause of peripheral neuropathy.  Nerve injury.  Pressure or stress on a nerve that lasts a long time.  Lack (deficiency) of B vitamins. This can result from alcoholism, poor diet, or a restricted diet.  Infections.  Autoimmune diseases, such as rheumatoid arthritis and systemic lupus erythematosus.  Nerve diseases that are passed from parent to child (inherited).  Some medicines, such as cancer medicines (chemotherapy).  Poisonous (toxic) substances, such as lead and mercury.  Too little blood flowing to the legs.  Kidney disease.  Thyroid disease. In some cases, the cause of this condition is not known. What are the signs or symptoms? Symptoms of this condition depend on which of your nerves is damaged. Common symptoms include:  Loss of feeling (numbness) in the feet, hands, or both.  Tingling in the feet, hands, or both.  Burning pain.  Very sensitive skin.  Weakness.  Not being able to move a part of the body (paralysis).  Muscle twitching.  Clumsiness or poor coordination.  Loss of balance.  Not being able to control your bladder.  Feeling dizzy.  Sexual problems. How is this diagnosed? Diagnosing and finding  the cause of peripheral neuropathy can be difficult. Your health care provider will take your medical history and do a physical exam. A neurological exam will also be done. This involves checking things that are affected by your brain, spinal cord, and nerves (nervous system). For example, your health care provider will check your reflexes, how you move, and what you can feel. You may have other tests, such as:  Blood tests.  Electromyogram (EMG) and nerve conduction tests. These tests check nerve function and how well the nerves are controlling the muscles.  Imaging tests, such as CT scans or MRI to rule out other causes of your symptoms.  Removing a small piece of nerve to be examined in a lab (nerve biopsy). This is rare.  Removing and examining a small amount of the fluid that surrounds the brain and spinal cord (lumbar puncture). This is rare. How is this treated? Treatment for this condition may involve:  Treating the underlying cause of the neuropathy, such as diabetes, kidney disease, or vitamin deficiencies.  Stopping medicines that can cause neuropathy, such as chemotherapy.  Medicine to relieve pain. Medicines may include: ? Prescription or over-the-counter pain medicine. ? Antiseizure medicine. ? Antidepressants. ? Pain-relieving patches that are applied to painful areas of skin.  Surgery to relieve pressure on a nerve or to destroy a nerve that is causing pain.  Physical therapy to help improve movement and balance.  Devices to help you move around (assistive devices). Follow these instructions at home: Medicines  Take over-the-counter and prescription medicines only as told by your health care provider. Do not take any other  medicines without first asking your health care provider.  Do not drive or use heavy machinery while taking prescription pain medicine. Lifestyle   Do not use any products that contain nicotine or tobacco, such as cigarettes and e-cigarettes.  Smoking keeps blood from reaching damaged nerves. If you need help quitting, ask your health care provider.  Avoid or limit alcohol. Too much alcohol can cause a vitamin B deficiency, and vitamin B is needed for healthy nerves.  Eat a healthy diet. This includes: ? Eating foods that are high in fiber, such as fresh fruits and vegetables, whole grains, and beans. ? Limiting foods that are high in fat and processed sugars, such as fried or sweet foods. General instructions   If you have diabetes, work closely with your health care provider to keep your blood sugar under control.  If you have numbness in your feet: ? Check every day for signs of injury or infection. Watch for redness, warmth, and swelling. ? Wear padded socks and comfortable shoes. These help protect your feet.  Develop a good support system. Living with peripheral neuropathy can be stressful. Consider talking with a mental health specialist or joining a support group.  Use assistive devices and attend physical therapy as told by your health care provider. This may include using a walker or a cane.  Keep all follow-up visits as told by your health care provider. This is important. Contact a health care provider if:  You have new signs or symptoms of peripheral neuropathy.  You are struggling emotionally from dealing with peripheral neuropathy.  Your pain is not well-controlled. Get help right away if:  You have an injury or infection that is not healing normally.  You develop new weakness in an arm or leg.  You fall frequently. Summary  Peripheral neuropathy is when the nerves in the arms, or legs are damaged, resulting in numbness, weakness, or pain.  There are many causes of peripheral neuropathy, including diabetes, pinched nerves, vitamin deficiencies, autoimmune disease, and hereditary conditions.  Diagnosing and finding the cause of peripheral neuropathy can be difficult. Your health care provider will  take your medical history, do a physical exam, and do tests, including blood tests and nerve function tests.  Treatment involves treating the underlying cause of the neuropathy and taking medicines to help control pain. Physical therapy and assistive devices may also help. This information is not intended to replace advice given to you by your health care provider. Make sure you discuss any questions you have with your health care provider. Document Revised: 08/16/2017 Document Reviewed: 11/12/2016 Elsevier Patient Education  2020 [employer].

## 2019-11-11 DIAGNOSIS — N185 Chronic kidney disease, stage 5: Secondary | ICD-10-CM | POA: Diagnosis not present

## 2019-11-11 DIAGNOSIS — E1122 Type 2 diabetes mellitus with diabetic chronic kidney disease: Secondary | ICD-10-CM | POA: Diagnosis not present

## 2019-11-11 DIAGNOSIS — G4730 Sleep apnea, unspecified: Secondary | ICD-10-CM | POA: Diagnosis not present

## 2019-11-11 DIAGNOSIS — G8929 Other chronic pain: Secondary | ICD-10-CM | POA: Diagnosis not present

## 2019-11-11 DIAGNOSIS — I120 Hypertensive chronic kidney disease with stage 5 chronic kidney disease or end stage renal disease: Secondary | ICD-10-CM | POA: Diagnosis not present

## 2019-11-11 DIAGNOSIS — D631 Anemia in chronic kidney disease: Secondary | ICD-10-CM | POA: Diagnosis not present

## 2019-11-12 NOTE — Progress Notes (Signed)
Subjective: Daniel Ochoa presents today for follow up of preventative diabetic foot care, follow up of onycholysis right great toe nailplate and mycotic nails b/l that are difficult to trim.   He states he is pleased with appearance of left great toe since the thick toenail has been come off.  PCP: [name] and last visit was 08/17/2019.  Allergies  Allergen Reactions  . Ace Inhibitors Shortness Of Breath    choking  . Statins Other (See Comments)    Elevated CK level  . Latex Rash    Severe blistery rash  . Sudafed [Pseudoephedrine Hcl] Rash     Objective: There were no vitals filed for this visit.  Vascular Examination:  Capillary fill time to digits <3s b/l, palpable DP pulses b/l, palpable PT pulses b/l, pedal hair absent b/l, skin temperature gradient within normal limits b/l and nonpitting edema noted b/l LE  Dermatological Examination: Pedal skin with normal turgor, texture and tone bilaterally, no open wounds bilaterally, no interdigital macerations bilaterally, toenails 1-5 right, 2-5 left elongated, dystrophic, thickened, crumbly with subungual debris and onycholysis of right great toe nailplate with entire nailplate loose. Underlying nailbed intact with no signs of inflammation/infection.  Left great toe nailbed remains intact with no signs of infection.  Incurvated nailplate left 2nd toe medial border(s) with nail border hypertrophy. No erythema, no edema, no drainage noted.  Musculoskeletal: Normal muscle strength 5/5 to all lower extremity muscle groups bilaterally, no pain crepitus or joint limitation noted with ROM b/l, bunion deformity noted b/l and hammertoes noted to the  2-5 bilaterally  Neurological: Protective sensation intact 5/5 intact bilaterally with 10g monofilament b/l and vibratory sensation absent b/l  Assessment: 1. Onychomycosis   2. Onycholysis   3. Ingrown toenail without infection   4. Diabetic peripheral neuropathy associated  with type 2 diabetes mellitus ([place])    Plan: -Toenails 1-5 right, 2-5 left debrided in length and girth without iatrogenic bleeding with sterile nail nipper and dremel.   -Right great toe nailplate gently debrided from it's remaining attachment to digit. Nailbed cleansed with alcohol. Triple antibiotic ointment applied. Mr. Keating was instructed to apply Vaseline Petroleum Jelly or Aquaphor Ointment to nailbeds of both great toes daily. -Offending nail border debrided and curretaged left 2nd toe medial border.  Border cleansed with alcohol and triple antibiotic applied. Mr. Mcnerney was instructed to apply antibiotic ointment to left 2nd toe once daily for one week. Call if he has any problems. -Continue diabetic foot care principles. Literature dispensed on today.  -Patient to continue soft, supportive shoe gear daily. -Patient to report any pedal injuries to medical professional immediately. -Patient/POA to call should there be question/concern in the interim.  Return in about 3 months (around 02/03/2020) for diabetic nail trim.

## 2019-11-16 DIAGNOSIS — F1320 Sedative, hypnotic or anxiolytic dependence, uncomplicated: Secondary | ICD-10-CM | POA: Insufficient documentation

## 2019-11-16 DIAGNOSIS — Z1331 Encounter for screening for depression: Secondary | ICD-10-CM | POA: Diagnosis not present

## 2019-11-16 DIAGNOSIS — G4733 Obstructive sleep apnea (adult) (pediatric): Secondary | ICD-10-CM | POA: Diagnosis not present

## 2019-11-16 DIAGNOSIS — M545 Low back pain: Secondary | ICD-10-CM | POA: Diagnosis not present

## 2019-11-16 DIAGNOSIS — E1129 Type 2 diabetes mellitus with other diabetic kidney complication: Secondary | ICD-10-CM | POA: Diagnosis not present

## 2019-11-16 DIAGNOSIS — N184 Chronic kidney disease, stage 4 (severe): Secondary | ICD-10-CM | POA: Diagnosis not present

## 2019-11-16 DIAGNOSIS — G309 Alzheimer's disease, unspecified: Secondary | ICD-10-CM | POA: Diagnosis not present

## 2019-11-16 DIAGNOSIS — E039 Hypothyroidism, unspecified: Secondary | ICD-10-CM | POA: Diagnosis not present

## 2019-11-19 DIAGNOSIS — E1129 Type 2 diabetes mellitus with other diabetic kidney complication: Secondary | ICD-10-CM | POA: Diagnosis not present

## 2020-01-07 DIAGNOSIS — N185 Chronic kidney disease, stage 5: Secondary | ICD-10-CM | POA: Diagnosis not present

## 2020-01-12 DIAGNOSIS — E1122 Type 2 diabetes mellitus with diabetic chronic kidney disease: Secondary | ICD-10-CM | POA: Diagnosis not present

## 2020-01-12 DIAGNOSIS — I120 Hypertensive chronic kidney disease with stage 5 chronic kidney disease or end stage renal disease: Secondary | ICD-10-CM | POA: Diagnosis not present

## 2020-01-12 DIAGNOSIS — G8929 Other chronic pain: Secondary | ICD-10-CM | POA: Diagnosis not present

## 2020-01-12 DIAGNOSIS — R609 Edema, unspecified: Secondary | ICD-10-CM | POA: Diagnosis not present

## 2020-01-12 DIAGNOSIS — N185 Chronic kidney disease, stage 5: Secondary | ICD-10-CM | POA: Diagnosis not present

## 2020-02-05 ENCOUNTER — Other Ambulatory Visit: Payer: Self-pay

## 2020-02-05 ENCOUNTER — Ambulatory Visit (INDEPENDENT_AMBULATORY_CARE_PROVIDER_SITE_OTHER): Payer: Medicare Other | Admitting: Podiatry

## 2020-02-05 DIAGNOSIS — B351 Tinea unguium: Secondary | ICD-10-CM

## 2020-02-05 DIAGNOSIS — M79675 Pain in left toe(s): Secondary | ICD-10-CM | POA: Diagnosis not present

## 2020-02-05 DIAGNOSIS — E1142 Type 2 diabetes mellitus with diabetic polyneuropathy: Secondary | ICD-10-CM

## 2020-02-05 DIAGNOSIS — M79674 Pain in right toe(s): Secondary | ICD-10-CM | POA: Diagnosis not present

## 2020-02-05 NOTE — Patient Instructions (Signed)
Diabetes Mellitus and Foot Care Foot care is an important part of your health, especially when you have diabetes. Diabetes may cause you to have problems because of poor blood flow (circulation) to your feet and legs, which can cause your skin to:  Become thinner and drier.  Break more easily.  Heal more slowly.  Peel and crack. You may also have nerve damage (neuropathy) in your legs and feet, causing decreased feeling in them. This means that you may not notice minor injuries to your feet that could lead to more serious problems. Noticing and addressing any potential problems early is the best way to prevent future foot problems. How to care for your feet Foot hygiene  Wash your feet daily with warm water and mild soap. Do not use hot water. Then, pat your feet and the areas between your toes until they are completely dry. Do not soak your feet as this can dry your skin.  Trim your toenails straight across. Do not dig under them or around the cuticle. File the edges of your nails with an emery board or nail file.  Apply a moisturizing lotion or petroleum jelly to the skin on your feet and to dry, brittle toenails. Use lotion that does not contain alcohol and is unscented. Do not apply lotion between your toes. Shoes and socks  Wear clean socks or stockings every day. Make sure they are not too tight. Do not wear knee-high stockings since they may decrease blood flow to your legs.  Wear shoes that fit properly and have enough cushioning. Always look in your shoes before you put them on to be sure there are no objects inside.  To break in new shoes, wear them for just a few hours a day. This prevents injuries on your feet. Wounds, scrapes, corns, and calluses  Check your feet daily for blisters, cuts, bruises, sores, and redness. If you cannot see the bottom of your feet, use a mirror or ask someone for help.  Do not cut corns or calluses or try to remove them with medicine.  If you  find a minor scrape, cut, or break in the skin on your feet, keep it and the skin around it clean and dry. You may clean these areas with mild soap and water. Do not clean the area with peroxide, alcohol, or iodine.  If you have a wound, scrape, corn, or callus on your foot, look at it several times a day to make sure it is healing and not infected. Check for: ? Redness, swelling, or pain. ? Fluid or blood. ? Warmth. ? Pus or a bad smell. General instructions  Do not cross your legs. This may decrease blood flow to your feet.  Do not use heating pads or hot water bottles on your feet. They may burn your skin. If you have lost feeling in your feet or legs, you may not know this is happening until it is too late.  Protect your feet from hot and cold by wearing shoes, such as at the beach or on hot pavement.  Schedule a complete foot exam at least once a year (annually) or more often if you have foot problems. If you have foot problems, report any cuts, sores, or bruises to your health care provider immediately. Contact a health care provider if:  You have a medical condition that increases your risk of infection and you have any cuts, sores, or bruises on your feet.  You have an injury that is not   healing.  You have redness on your legs or feet.  You feel burning or tingling in your legs or feet.  You have pain or cramps in your legs and feet.  Your legs or feet are numb.  Your feet always feel cold.  You have pain around a toenail. Get help right away if:  You have a wound, scrape, corn, or callus on your foot and: ? You have pain, swelling, or redness that gets worse. ? You have fluid or blood coming from the wound, scrape, corn, or callus. ? Your wound, scrape, corn, or callus feels warm to the touch. ? You have pus or a bad smell coming from the wound, scrape, corn, or callus. ? You have a fever. ? You have a red line going up your leg. Summary  Check your feet every day  for cuts, sores, red spots, swelling, and blisters.  Moisturize feet and legs daily.  Wear shoes that fit properly and have enough cushioning.  If you have foot problems, report any cuts, sores, or bruises to your health care provider immediately.  Schedule a complete foot exam at least once a year (annually) or more often if you have foot problems. This information is not intended to replace advice given to you by your health care provider. Make sure you discuss any questions you have with your health care provider. Document Revised: 05/27/2019 Document Reviewed: 10/05/2016 Elsevier Patient Education  2020 [employer]  

## 2020-02-12 ENCOUNTER — Encounter: Payer: Self-pay | Admitting: Podiatry

## 2020-02-12 DIAGNOSIS — E786 Lipoprotein deficiency: Secondary | ICD-10-CM | POA: Diagnosis not present

## 2020-02-12 DIAGNOSIS — Z125 Encounter for screening for malignant neoplasm of prostate: Secondary | ICD-10-CM | POA: Diagnosis not present

## 2020-02-12 DIAGNOSIS — E1129 Type 2 diabetes mellitus with other diabetic kidney complication: Secondary | ICD-10-CM | POA: Diagnosis not present

## 2020-02-12 DIAGNOSIS — E038 Other specified hypothyroidism: Secondary | ICD-10-CM | POA: Diagnosis not present

## 2020-02-12 NOTE — Progress Notes (Signed)
Subjective: Daniel Ochoa presents today at risk foot care. Pt has h/o NIDDM with chronic kidney disease and painful mycotic nails b/l that are difficult to trim. Pain interferes with ambulation. Aggravating factors include wearing enclosed shoe gear. Pain is relieved with periodic professional debridement.  His wife is present during the visit. They voice no new pedal concerns on today's visit. Last A1c was 7.1%.  [name], MD is patient's PCP. Last visit was: 08/07/2019.  Past Medical History:  Diagnosis Date  . Arthritis   . Chronic fatigue   . Chronic kidney disease, stage IV (severe) ([place])   . Complication of anesthesia    one surgery took 4 people to hold him down 2000  . Constipation   . DM (diabetes mellitus), type 2, uncontrolled, with renal complications ([place])    [facility]  . DVT (deep venous thrombosis) ([place])    RLE DVT with intermediate risk VQ scan for PE 03/2014  . Dyspnea   . Essential hypertension   . Fibromyalgia   . Hypothyroidism   . Neuropathy   . Obesity   . OSA on CPAP      Current Outpatient Medications on File Prior to Visit  Medication Sig Dispense Refill  . Accu-Chek Softclix Lancets lancets USE TO CHECK BLOOD SUGAR UP TO TID E11.29    . amLODipine (NORVASC) 5 MG tablet Take 5 mg by mouth daily.     . Ascorbic Acid (VITAMIN C) 1000 MG tablet Take 1,000 mg by mouth daily.    Marland Kitchen atenolol (TENORMIN) 50 MG tablet Take 50 mg by mouth 2 (two) times daily.     . Calcium Carb-Cholecalciferol (CALCIUM 600 + D PO) Take 1 tablet by mouth 2 (two) times daily.    . calcium carbonate (OS-CAL) 1250 (500 Ca) MG chewable tablet Chew by mouth.    . clonazePAM (KLONOPIN) 1 MG tablet Take 1 mg by mouth at bedtime.     . DYMISTA 137-50 MCG/ACT SUSP Place 1 spray into both nostrils 2 (two) times daily.     . famotidine (PEPCID) 40 MG tablet Take 40 mg by mouth at bedtime.     Marland Kitchen FREESTYLE TEST STRIPS test strip USE AS DIRECTED TO TEST BLOOD SUGAR 5 TIMES  PER DAY. DX: E11.65  11  . furosemide (LASIX) 40 MG tablet Take 40 mg by mouth 2 (two) times daily.     Marland Kitchen HUMALOG 100 UNIT/ML injection USE IN OMNIPOD DAILY (AVERAGE OF 110 UNITS/DAY) **E11.29  2  . HYDROcodone-acetaminophen (NORCO/VICODIN) 5-325 MG tablet TAKE 1 TABLET BY MOUTH 4 TIMES DAILY AS NEEDED FOR MODERATE PAIN.  0  . Lancets Misc. (ACCU-CHEK FASTCLIX LANCET) KIT USE TO CHECK BLOOD SUGAR UP TO TID E11.29    . mupirocin cream (BACTROBAN) 2 % Apply 1 application topically daily. 30 g 0  . nystatin (MYCOSTATIN) 500000 units TABS tablet Take 1 tablet by mouth daily as needed. Uses for heat rash    . SYNTHROID 200 MCG tablet Take 200 mcg by mouth See admin instructions. Takes the 200 mcg daily except on Sundays, pt takes 300 mcg (or 1.5 tablets)    . alum hydroxide-mag trisilicate (GAVISCON) 16-10 MG CHEW chewable tablet Chew by mouth.    Marland Kitchen ipratropium (ATROVENT) 0.03 % nasal spray USE 1 TO 2 SPRAY(S) IN EACH NOSTRIL THREE TIMES DAILY AS NEEDED FOR CONGESTION     No current facility-administered medications on file prior to visit.     Allergies  Allergen Reactions  . Ace  Inhibitors Shortness Of Breath    choking  . Statins Other (See Comments)    Elevated CK level  . Latex Rash    Severe blistery rash  . Sudafed [Pseudoephedrine Hcl] Rash    Objective: Daniel Ochoa is a pleasant 79 y.o. y.o. Patient Race: White or Caucasian [1]  male in NAD. AAO x 3.  There were no vitals filed for this visit.  Vascular Examination: Neurovascular status unchanged b/l. Capillary fill time to digits <3 seconds b/l. Palpable DP pulses b/l. Palpable PT pulses b/l. Pedal hair absent b/l Skin temperature gradient within normal limits b/l. Nonpitting edema noted b/l LE.  Dermatological Examination: Pedal skin with normal turgor, texture and tone bilaterally. No open wounds bilaterally. No interdigital macerations bilaterally. Toenails 2-5 bilaterally and R hallux elongated, dystrophic,  thickened, and crumbly with subungual debris and tenderness to dorsal palpation. Anonychia noted L hallux. Nailbed(s) epithelialized.   Musculoskeletal: Normal muscle strength 5/5 to all lower extremity muscle groups bilaterally. No pain crepitus or joint limitation noted with ROM b/l. Hallux valgus with bunion deformity noted b/l. Hammertoes noted to the 2-5 bilaterally.  Neurological Examination: Protective sensation intact 5/5 intact bilaterally with 10g monofilament b/l. Vibratory sensation diminished b/l. Proprioception intact bilaterally. Clonus negative b/l.  Assessment: 1. Pain due to onychomycosis of toenails of both feet   2. Diabetic peripheral neuropathy associated with type 2 diabetes mellitus ([place])    Plan: -Examined patient. -No new findings. No new orders. -Continue diabetic foot care principles. Literature dispensed on today.  -Toenails 2-5 bilaterally and R hallux debrided in length and girth without iatrogenic bleeding with sterile nail nipper and dremel.  -Patient to continue soft, supportive shoe gear daily. -Patient to report any pedal injuries to medical professional immediately. -Patient/POA to call should there be question/concern in the interim.  Followup 10-12 weeks.  Marzetta Board, DPM

## 2020-02-22 DIAGNOSIS — M21371 Foot drop, right foot: Secondary | ICD-10-CM | POA: Diagnosis not present

## 2020-02-22 DIAGNOSIS — E786 Lipoprotein deficiency: Secondary | ICD-10-CM | POA: Diagnosis not present

## 2020-02-22 DIAGNOSIS — I480 Paroxysmal atrial fibrillation: Secondary | ICD-10-CM | POA: Diagnosis not present

## 2020-02-22 DIAGNOSIS — R82998 Other abnormal findings in urine: Secondary | ICD-10-CM | POA: Diagnosis not present

## 2020-02-22 DIAGNOSIS — Z0000 Encounter for general adult medical examination without abnormal findings: Secondary | ICD-10-CM | POA: Diagnosis not present

## 2020-02-22 DIAGNOSIS — M25511 Pain in right shoulder: Secondary | ICD-10-CM | POA: Diagnosis not present

## 2020-02-22 DIAGNOSIS — I129 Hypertensive chronic kidney disease with stage 1 through stage 4 chronic kidney disease, or unspecified chronic kidney disease: Secondary | ICD-10-CM | POA: Diagnosis not present

## 2020-02-22 DIAGNOSIS — E1129 Type 2 diabetes mellitus with other diabetic kidney complication: Secondary | ICD-10-CM | POA: Diagnosis not present

## 2020-02-22 DIAGNOSIS — N184 Chronic kidney disease, stage 4 (severe): Secondary | ICD-10-CM | POA: Diagnosis not present

## 2020-02-22 DIAGNOSIS — E039 Hypothyroidism, unspecified: Secondary | ICD-10-CM | POA: Diagnosis not present

## 2020-02-22 DIAGNOSIS — M25519 Pain in unspecified shoulder: Secondary | ICD-10-CM | POA: Insufficient documentation

## 2020-02-22 DIAGNOSIS — G4733 Obstructive sleep apnea (adult) (pediatric): Secondary | ICD-10-CM | POA: Diagnosis not present

## 2020-02-22 DIAGNOSIS — Z794 Long term (current) use of insulin: Secondary | ICD-10-CM | POA: Diagnosis not present

## 2020-02-25 DIAGNOSIS — K921 Melena: Secondary | ICD-10-CM | POA: Diagnosis not present

## 2020-02-25 LAB — IFOBT (OCCULT BLOOD): IFOBT: POSITIVE

## 2020-03-04 DIAGNOSIS — N185 Chronic kidney disease, stage 5: Secondary | ICD-10-CM | POA: Diagnosis not present

## 2020-04-11 ENCOUNTER — Emergency Department (HOSPITAL_COMMUNITY)
Admission: EM | Admit: 2020-04-11 | Discharge: 2020-04-11 | Discharge status: Against Medical Advice | Payer: Medicare Other

## 2020-04-11 ENCOUNTER — Other Ambulatory Visit: Payer: Self-pay

## 2020-04-11 DIAGNOSIS — E1129 Type 2 diabetes mellitus with other diabetic kidney complication: Secondary | ICD-10-CM | POA: Diagnosis not present

## 2020-04-11 DIAGNOSIS — I872 Venous insufficiency (chronic) (peripheral): Secondary | ICD-10-CM | POA: Diagnosis not present

## 2020-04-11 DIAGNOSIS — I480 Paroxysmal atrial fibrillation: Secondary | ICD-10-CM | POA: Diagnosis not present

## 2020-04-11 DIAGNOSIS — M6281 Muscle weakness (generalized): Secondary | ICD-10-CM | POA: Diagnosis not present

## 2020-04-11 DIAGNOSIS — M545 Low back pain: Secondary | ICD-10-CM | POA: Diagnosis not present

## 2020-04-11 DIAGNOSIS — I129 Hypertensive chronic kidney disease with stage 1 through stage 4 chronic kidney disease, or unspecified chronic kidney disease: Secondary | ICD-10-CM | POA: Diagnosis not present

## 2020-04-11 DIAGNOSIS — E039 Hypothyroidism, unspecified: Secondary | ICD-10-CM | POA: Diagnosis not present

## 2020-04-11 DIAGNOSIS — R531 Weakness: Secondary | ICD-10-CM | POA: Insufficient documentation

## 2020-04-11 DIAGNOSIS — N184 Chronic kidney disease, stage 4 (severe): Secondary | ICD-10-CM | POA: Diagnosis not present

## 2020-04-11 NOTE — ED Notes (Signed)
Patient leaving d/t wait time and his original registration not being completed. Pt been here since 17:09 without being triaged pt wife states.

## 2020-04-12 ENCOUNTER — Emergency Department (HOSPITAL_BASED_OUTPATIENT_CLINIC_OR_DEPARTMENT_OTHER)
Admission: EM | Admit: 2020-04-12 | Discharge: 2020-04-12 | Disposition: A | Discharge status: Home / Self Care | Payer: Medicare Other | Attending: Emergency Medicine | Admitting: Emergency Medicine

## 2020-04-12 ENCOUNTER — Emergency Department (HOSPITAL_BASED_OUTPATIENT_CLINIC_OR_DEPARTMENT_OTHER): Payer: Medicare Other

## 2020-04-12 ENCOUNTER — Encounter (HOSPITAL_BASED_OUTPATIENT_CLINIC_OR_DEPARTMENT_OTHER): Payer: Self-pay | Admitting: *Deleted

## 2020-04-12 ENCOUNTER — Other Ambulatory Visit: Payer: Self-pay

## 2020-04-12 DIAGNOSIS — I129 Hypertensive chronic kidney disease with stage 1 through stage 4 chronic kidney disease, or unspecified chronic kidney disease: Secondary | ICD-10-CM | POA: Insufficient documentation

## 2020-04-12 DIAGNOSIS — Z86718 Personal history of other venous thrombosis and embolism: Secondary | ICD-10-CM | POA: Diagnosis not present

## 2020-04-12 DIAGNOSIS — Z91040 Latex allergy status: Secondary | ICD-10-CM | POA: Insufficient documentation

## 2020-04-12 DIAGNOSIS — N184 Chronic kidney disease, stage 4 (severe): Secondary | ICD-10-CM | POA: Diagnosis not present

## 2020-04-12 DIAGNOSIS — M5432 Sciatica, left side: Secondary | ICD-10-CM | POA: Insufficient documentation

## 2020-04-12 DIAGNOSIS — E1122 Type 2 diabetes mellitus with diabetic chronic kidney disease: Secondary | ICD-10-CM | POA: Diagnosis not present

## 2020-04-12 DIAGNOSIS — M5442 Lumbago with sciatica, left side: Secondary | ICD-10-CM | POA: Diagnosis not present

## 2020-04-12 DIAGNOSIS — Z86711 Personal history of pulmonary embolism: Secondary | ICD-10-CM | POA: Insufficient documentation

## 2020-04-12 DIAGNOSIS — E039 Hypothyroidism, unspecified: Secondary | ICD-10-CM | POA: Diagnosis not present

## 2020-04-12 DIAGNOSIS — Z87891 Personal history of nicotine dependence: Secondary | ICD-10-CM | POA: Diagnosis not present

## 2020-04-12 DIAGNOSIS — M79605 Pain in left leg: Secondary | ICD-10-CM

## 2020-04-12 DIAGNOSIS — E1140 Type 2 diabetes mellitus with diabetic neuropathy, unspecified: Secondary | ICD-10-CM | POA: Diagnosis not present

## 2020-04-12 DIAGNOSIS — M79652 Pain in left thigh: Secondary | ICD-10-CM | POA: Diagnosis not present

## 2020-04-12 DIAGNOSIS — M79604 Pain in right leg: Secondary | ICD-10-CM | POA: Diagnosis not present

## 2020-04-12 DIAGNOSIS — R600 Localized edema: Secondary | ICD-10-CM | POA: Diagnosis not present

## 2020-04-12 DIAGNOSIS — Z7984 Long term (current) use of oral hypoglycemic drugs: Secondary | ICD-10-CM | POA: Insufficient documentation

## 2020-04-12 NOTE — ED Provider Notes (Signed)
[facility] EMERGENCY DEPARTMENT Provider Note   CSN: [id number] Arrival date & time: 04/12/20  1126     History Chief Complaint  Patient presents with  . Leg Pain    Daniel Ochoa is a 79 y.o. male the history of DVT in the right leg, no longer anticoagulation, presenting to the emergency department with left leg pain for 4 days.  Patient reports that he woke up 4 days ago having pain and his left anterior thigh and his left hip.  He says at times, particularly when he is standing and walking, he feels like the pain radiates from his hip down to his left knee.  He says he has difficulty standing up but once he is up he is able to ambulate with a cane.  He does have a history of significant lower back problems and has had multiple spinal surgeries in his lumbar spine.  He no longer follows with his old spinal doctor.  He denies any chest pain, shortness of breath.  HPI     Past Medical History:  Diagnosis Date  . Arthritis   . Chronic fatigue   . Chronic kidney disease, stage IV (severe) ([place])   . Complication of anesthesia    one surgery took 4 people to hold him down 2000  . Constipation   . DM (diabetes mellitus), type 2, uncontrolled, with renal complications ([place])    [facility]  . DVT (deep venous thrombosis) ([place])    RLE DVT with intermediate risk VQ scan for PE 03/2014  . Dyspnea   . Essential hypertension   . Fibromyalgia   . Hypothyroidism   . Neuropathy   . Obesity   . OSA on CPAP     Patient Active Problem List   Diagnosis Date Noted  . Diabetic peripheral neuropathy ([place]) 10/26/2015  . Dyspnea 07/23/2014  . Acute pulmonary embolism ([place]) 04/15/2014  . Hypoxia 04/13/2014  . Acute deep vein thrombosis (DVT) of tibial vein of right lower extremity ([place]) 04/13/2014  . Pulmonary embolism ([place]) 04/13/2014  . DM (diabetes mellitus), type 2, uncontrolled, with renal complications ([place])   . Essential hypertension   . OSA on CPAP   . Chronic  kidney disease, stage IV (severe) ([facility])     Past Surgical History:  Procedure Laterality Date  . AV FISTULA PLACEMENT Left 04/18/2017   Procedure: ARTERIOVENOUS (AV) FISTULA CREATION;  Surgeon: Angelia Mould, MD;  Location: [facility];  Service: Vascular;  Laterality: Left;  . CERVICAL LAMINECTOMY    . LUMBAR FUSION     x 2  . ROTATOR CUFF REPAIR Right   . THYROIDECTOMY, PARTIAL         Family History  Problem Relation Age of Onset  . Cancer Father     Social History   Tobacco Use  . Smoking status: Former Smoker    Years: 20.00    Types: Pipe  . Smokeless tobacco: Never Used  . Tobacco comment: quit in 1993  Vaping Use  . Vaping Use: Never used  Substance Use Topics  . Alcohol use: No  . Drug use: No    Home Medications Prior to Admission medications   Medication Sig Start Date End Date Taking? Authorizing Provider  Accu-Chek Softclix Lancets lancets USE TO CHECK BLOOD SUGAR UP TO TID E11.29 12/21/19   [provider]  alum hydroxide-mag trisilicate (GAVISCON) 71-16 MG CHEW chewable tablet Chew by mouth.    [provider]  amLODipine (NORVASC) 5 MG  tablet Take 5 mg by mouth daily.  03/18/15   [provider]  Ascorbic Acid (VITAMIN C) 1000 MG tablet Take 1,000 mg by mouth daily.    [provider]  atenolol (TENORMIN) 50 MG tablet Take 50 mg by mouth 2 (two) times daily.  01/31/14   [provider]  Calcium Carb-Cholecalciferol (CALCIUM 600 + D PO) Take 1 tablet by mouth 2 (two) times daily.    [provider]  calcium carbonate (OS-CAL) 1250 (500 Ca) MG chewable tablet Chew by mouth.    [provider]  clonazePAM (KLONOPIN) 1 MG tablet Take 1 mg by mouth at bedtime.  02/18/14   [provider]  DYMISTA 137-50 MCG/ACT SUSP Place 1 spray into both nostrils 2 (two) times daily.  01/13/14   [provider]  famotidine (PEPCID) 40 MG tablet Take 40 mg by mouth at bedtime.  03/17/14   [provider]  FREESTYLE TEST STRIPS test strip USE AS DIRECTED TO TEST BLOOD SUGAR 5 TIMES PER DAY. DX: E11.65 06/06/18   [provider]  furosemide (LASIX) 40 MG tablet Take 40 mg by mouth 2 (two) times daily.  10/20/14   [provider]  HUMALOG 100 UNIT/ML injection USE IN OMNIPOD DAILY (AVERAGE OF 110 UNITS/DAY) **E11.29 07/03/18   [provider]  HYDROcodone-acetaminophen (NORCO/VICODIN) 5-325 MG tablet TAKE 1 TABLET BY MOUTH 4 TIMES DAILY AS NEEDED FOR MODERATE PAIN. 06/27/18   [provider]  ipratropium (ATROVENT) 0.03 % nasal spray USE 1 TO 2 SPRAY(S) IN EACH NOSTRIL THREE TIMES DAILY AS NEEDED FOR CONGESTION 09/23/18   [provider]  Lancets Misc. (ACCU-CHEK FASTCLIX LANCET) KIT USE TO CHECK BLOOD SUGAR UP TO TID E11.29 12/23/19   [provider]  mupirocin cream (BACTROBAN) 2 % Apply 1 application topically daily. 08/07/19   Marzetta Board, DPM  nystatin (MYCOSTATIN) 500000 units TABS tablet Take 1 tablet by mouth daily as needed. Uses for heat rash 03/01/17   [provider]  SYNTHROID 200 MCG tablet Take 200 mcg by mouth See admin instructions. Takes the 200 mcg daily except on Sundays, pt takes 300 mcg (or 1.5 tablets) 01/12/14   [provider]    Allergies    Ace inhibitors, Statins, Latex, and Sudafed [pseudoephedrine hcl]  Review of Systems   Review of Systems  Constitutional: Negative for chills and fever.  HENT: Negative for ear pain and sore throat.   Eyes: Negative for pain and visual disturbance.  Respiratory: Negative for cough and shortness of breath.   Cardiovascular: Negative for chest pain and palpitations.  Gastrointestinal: Negative for abdominal pain and vomiting.  Genitourinary: Negative for difficulty urinating and dysuria.  Musculoskeletal: Positive for arthralgias, back pain and myalgias.  Skin: Negative for color change and rash.  Neurological: Positive for weakness. Negative for  syncope, numbness and headaches.  Psychiatric/Behavioral: Negative for agitation and confusion.  All other systems reviewed and are negative.   Physical Exam Updated Vital Signs BP (!) 130/77 (BP Location: Right Arm)   Pulse 69   Temp 98.4 F (36.9 C) (Oral)   Resp 18   Ht 6' 1" (1.854 m)   Wt (!) 125.6 kg   SpO2 97%   BMI 36.55 kg/m   Physical Exam Vitals and nursing note reviewed.  Constitutional:      Appearance: He is well-developed.  HENT:     Head: Normocephalic and atraumatic.  Eyes:     Conjunctiva/sclera: Conjunctivae normal.  Cardiovascular:  Rate and Rhythm: Normal rate and regular rhythm.     Pulses: Normal pulses.  Pulmonary:     Effort: Pulmonary effort is normal. No respiratory distress.     Breath sounds: Normal breath sounds.  Musculoskeletal:        General: No swelling, tenderness or deformity.     Cervical back: Neck supple.  Skin:    General: Skin is warm and dry.     Comments: Fistula site left arm with palpable thrill  Neurological:     General: No focal deficit present.     Mental Status: He is alert and oriented to person, place, and time.     Sensory: No sensory deficit.     Motor: No weakness.     Comments: Positive straight leg test on left side No saddle anesthesia Patient can stand and bear weight on left leg  Psychiatric:        Mood and Affect: Mood normal.        Behavior: Behavior normal.     ED Results / Procedures / Treatments   Labs (all labs ordered are listed, but only abnormal results are displayed) Labs Reviewed - No data to display  EKG None  Radiology US Venous Img Lower Unilateral Left  Result Date: 04/12/2020 CLINICAL DATA:  Left thigh pain for 4 days EXAM: LEFT LOWER EXTREMITY VENOUS DOPPLER ULTRASOUND TECHNIQUE: Gray-scale sonography with graded compression, as well as color Doppler and duplex ultrasound were performed to evaluate the lower extremity deep venous systems from the level of the common  femoral vein and including the common femoral, femoral, profunda femoral, popliteal and calf veins including the posterior tibial, peroneal and gastrocnemius veins when visible. The superficial great saphenous vein was also interrogated. Spectral Doppler was utilized to evaluate flow at rest and with distal augmentation maneuvers in the common femoral, femoral and popliteal veins. COMPARISON:  None. FINDINGS: Contralateral Common Femoral Vein: Respiratory phasicity is normal and symmetric with the symptomatic side. No evidence of thrombus. Normal compressibility. Common Femoral Vein: No evidence of thrombus. Normal compressibility, respiratory phasicity and response to augmentation. Saphenofemoral Junction: No evidence of thrombus. Normal compressibility and flow on color Doppler imaging. Profunda Femoral Vein: No evidence of thrombus. Normal compressibility and flow on color Doppler imaging. Femoral Vein: No evidence of thrombus. Normal compressibility, respiratory phasicity and response to augmentation. Popliteal Vein: No evidence of thrombus. Normal compressibility, respiratory phasicity and response to augmentation. Calf Veins: Not well visualized secondary to soft tissue edema at the calf. Superficial Great Saphenous Vein: No evidence of thrombus. Normal compressibility. Venous Reflux:  None. Other Findings:  None. IMPRESSION: 1. No evidence of acute DVT within the visualized left lower extremity. 2. Nonvisualization of the calf veins secondary to poor penetration and soft tissue edema. Electronically Signed   By: Davina Poke D.O.   On: 04/12/2020 14:53    Procedures Procedures (including critical care time)  Medications Ordered in ED Medications - No data to display  ED Course  I have reviewed the triage vital signs and the nursing notes.  Pertinent labs & imaging results that were available during my care of the patient were reviewed by me and considered in my medical decision making (see  chart for details).  79 yo male here with left hip and left upper leg pain  DDx includes sciatica vs peripheral neuropathy including paresthetica meralgia vs DVT vs other  Most suspicious of sciatica with his radiation of symptoms, history of lower spinal surgeries, worsening of symptoms  with standing and lying flat, and positive straight leg test.  No red flags for cord compression or cauda equina syndrome on my exam  With his hx of DVT we'll get a vascular ultrasound here   Clinical Course as of Apr 12 1705  Tue Apr 12, 2020  1458 IMPRESSION: 1. No evidence of acute DVT within the visualized left lower extremity. 2. Nonvisualization of the calf veins secondary to poor penetration and soft tissue edema.     [MT]  1458 Pain is upper thigh, not in the calf - okay with poor visualization of calf veins on this scan.  This is likely sciatica.  Patient takes hydrocodone at home chronically for pain and can continue this, I would not do NSAIDS or prednisone with his diabetes and poor kidney function.     [MT]    Clinical Course User Index [MT] Trifan, Carola Rhine, MD    Final Clinical Impression(s) / ED Diagnoses Final diagnoses:  Left leg pain  Sciatica of left side    Rx / DC Orders ED Discharge Orders    None       Wyvonnia Dusky, MD 04/12/20 [phone]

## 2020-04-12 NOTE — ED Notes (Signed)
Pt discharged to home. Discharge instructions have been discussed with patient and/or family members. Pt verbally acknowledges understanding d/c instructions, and endorses comprehension to checkout at registration before leaving.  °

## 2020-04-12 NOTE — Discharge Instructions (Signed)
You should talk to your primary care doctor about setting up an MRI of your lumbar spine.  I think you have sciatica or nerve pain in your left leg.  Use your walker at home and be careful with standing.

## 2020-04-12 NOTE — ED Triage Notes (Signed)
Left upper leg pain for a week. He had pain prior that was relieved with a steroid injection.

## 2020-05-04 NOTE — Progress Notes (Signed)
Cardiology Office Note   Date:  05/05/2020   ID:  Kylo, Gavin 1941-04-18, MRN [id number]  PCP:  [name], MD    No chief complaint on file.  PAF  Wt Readings from Last 3 Encounters:  05/05/20 279 lb (126.6 kg)  04/12/20 (!) 277 lb (125.6 kg)  05/18/19 277 lb (125.6 kg)       History of Present Illness: Marv Alfrey is a 79 y.o. male who is being seen today for the evaluation of AFib at the request of [name], MD.  Prior DVT/PE after long car trip many years ago.  Took a blood thinner for several months at that time.   He has renal failure, with a fistula in place, but not on dialysis.   He was noted to have AFib at a sleep study in 2020 per his report.  Uses CPAP.    Here for further eval. Request is to see about the burden of AFib to decide on Coumadin therapy.   Denies : Chest pain. Dizziness. Leg edema. Nitroglycerin use. Orthopnea. Palpitations. Paroxysmal nocturnal dyspnea. Shortness of breath. Syncope.   Avoiding nephrotoxins and dehydration to postpone need for dialysis.    Past Medical History:  Diagnosis Date  . Acute deep vein thrombosis (DVT) of tibial vein of right lower extremity ([place]) 04/13/2014  . Acute pulmonary embolism ([place]) 04/15/2014  . Arthritis   . Chronic fatigue   . Chronic kidney disease, stage IV (severe) ([place])   . Complication of anesthesia    one surgery took 4 people to hold him down 2000  . Constipation   . Diabetic peripheral neuropathy ([place]) 10/26/2015  . DM (diabetes mellitus), type 2, uncontrolled, with renal complications ([place])    [facility]  . DVT (deep venous thrombosis) ([place])    RLE DVT with intermediate risk VQ scan for PE 03/2014  . Dyspnea   . Essential hypertension   . Fibromyalgia   . Hypothyroidism   . Hypoxia 04/13/2014  . Neuropathy   . Obesity   . OSA on CPAP   . Pulmonary embolism ([place]) 04/13/2014   VQ 04/13/14 intermediate assoc with R DVT > on coumadin since Echo 04/14/14 >  PA peak pressure: 89 mm Hg (S).     Past Surgical History:  Procedure Laterality Date  . AV FISTULA PLACEMENT Left 04/18/2017   Procedure: ARTERIOVENOUS (AV) FISTULA CREATION;  Surgeon: Angelia Mould, MD;  Location: [facility];  Service: Vascular;  Laterality: Left;  . CERVICAL LAMINECTOMY    . LUMBAR FUSION     x 2  . ROTATOR CUFF REPAIR Right   . THYROIDECTOMY, PARTIAL       Current Outpatient Medications  Medication Sig Dispense Refill  . Accu-Chek Softclix Lancets lancets USE TO CHECK BLOOD SUGAR UP TO TID E11.29    . amLODipine (NORVASC) 5 MG tablet Take 5 mg by mouth daily.     . Ascorbic Acid (VITAMIN C) 1000 MG tablet Take 1,000 mg by mouth daily.    Marland Kitchen atenolol (TENORMIN) 50 MG tablet Take 50 mg by mouth 2 (two) times daily.     . Calcium Carb-Cholecalciferol (CALCIUM 600 + D PO) Take 1 tablet by mouth 2 (two) times daily.    . calcium carbonate (OS-CAL) 1250 (500 Ca) MG chewable tablet Chew by mouth.    . clonazePAM (KLONOPIN) 1 MG tablet Take 1 mg by mouth at bedtime.     . DYMISTA 137-50 MCG/ACT SUSP Place 1  spray into both nostrils 2 (two) times daily.     . famotidine (PEPCID) 40 MG tablet Take 40 mg by mouth at bedtime.     Marland Kitchen FREESTYLE TEST STRIPS test strip USE AS DIRECTED TO TEST BLOOD SUGAR 5 TIMES PER DAY. DX: E11.65  11  . furosemide (LASIX) 40 MG tablet Take 40 mg by mouth 2 (two) times daily.     Marland Kitchen HUMALOG 100 UNIT/ML injection USE IN OMNIPOD DAILY (AVERAGE OF 110 UNITS/DAY) **E11.29  2  . HYDROcodone-acetaminophen (NORCO/VICODIN) 5-325 MG tablet TAKE 1 TABLET BY MOUTH 4 TIMES DAILY AS NEEDED FOR MODERATE PAIN.  0  . Lancets Misc. (ACCU-CHEK FASTCLIX LANCET) KIT USE TO CHECK BLOOD SUGAR UP TO TID E11.29    . mupirocin cream (BACTROBAN) 2 % Apply 1 application topically daily. 30 g 0  . nystatin (MYCOSTATIN) 500000 units TABS tablet Take 1 tablet by mouth daily as needed. Uses for heat rash    . SYNTHROID 200 MCG tablet Take 200 mcg by mouth See admin  instructions. Takes the 200 mcg daily except on Sundays, pt takes 300 mcg (or 1.5 tablets)     No current facility-administered medications for this visit.    Allergies:   Ace inhibitors, Statins, Latex, and Sudafed [pseudoephedrine hcl]    Social History:  The patient  reports that he has quit smoking. His smoking use included pipe. He quit after 20.00 years of use. He has never used smokeless tobacco. He reports that he does not drink alcohol and does not use drugs.   Family History:  The patient's family history includes Cancer in his father.    ROS:  Please see the history of present illness.   Otherwise, review of systems are positive for occasional fatigue.   All other systems are reviewed and negative.    PHYSICAL EXAM: VS:  BP 122/70   Pulse 66   Ht $R'6\' 1"'zr$  (1.854 m)   Wt 279 lb (126.6 kg)   SpO2 97%   BMI 36.81 kg/m  , BMI Body mass index is 36.81 kg/m. GEN: Well nourished, well developed, in no acute distress  HEENT: normal  Neck: no JVD, carotid bruits, or masses Cardiac: RRR; no murmurs, rubs, or gallops,no edema  Respiratory:  clear to auscultation bilaterally, normal work of breathing GI: soft, nontender, nondistended, + BS, obese MS: no deformity or atrophy  Skin: warm and dry, no rash Neuro:  Strength and sensation are intact Psych: euthymic mood, full affect   EKG:   The ekg ordered today demonstrates NSR, no ST changes   Recent Labs: No results found for requested labs within last 8760 hours.   Lipid Panel No results found for: CHOL, TRIG, HDL, CHOLHDL, VLDL, LDLCALC, LDLDIRECT   Other studies Reviewed: Additional studies/ records that were reviewed today with results demonstrating: records from PMD.   ASSESSMENT AND PLAN:  1. PAF: single episode of PAF noted at sleep study in 05/2019.  No documented AFIb since then.  Unable to see strips since that time.  Discussed with Dr. Rayann Heman re: ILR.  Check echo as well.  Will do 30 day monitor to look for  AFib.  If no AFib is noted, would plan for ILR>  If AFib is noted, would plan for anticoagulation.  I would like to see the strip showing AFib, before commiting him to anticoagulation. 2. CKD: stage 4.  Avoid nephrotoxins. 3. Pulmonary HTN: Noted on 2015 echo. Will reassess with echo.    Current medicines are  reviewed at length with the patient today.  The patient concerns regarding his medicines were addressed.  The following changes have been made:  No change  Labs/ tests ordered today include:  No orders of the defined types were placed in this encounter.   Recommend 150 minutes/week of aerobic exercise Low fat, low carb, high fiber diet recommended  Disposition:   FU in based on results; if monitor negative, will send to EP for ILR.   SignedLarae Grooms, MD  05/05/2020 11:30 AM    [facility] [place], [place], [place]  [address] Phone: [phone]; Fax: [phone]

## 2020-05-05 ENCOUNTER — Ambulatory Visit (INDEPENDENT_AMBULATORY_CARE_PROVIDER_SITE_OTHER): Payer: Medicare Other | Admitting: Interventional Cardiology

## 2020-05-05 ENCOUNTER — Encounter: Payer: Self-pay | Admitting: Interventional Cardiology

## 2020-05-05 ENCOUNTER — Other Ambulatory Visit: Payer: Self-pay

## 2020-05-05 ENCOUNTER — Telehealth: Payer: Self-pay | Admitting: Radiology

## 2020-05-05 VITALS — BP 122/70 | HR 66 | Ht 73.0 in | Wt 279.0 lb

## 2020-05-05 DIAGNOSIS — N184 Chronic kidney disease, stage 4 (severe): Secondary | ICD-10-CM

## 2020-05-05 DIAGNOSIS — I272 Pulmonary hypertension, unspecified: Secondary | ICD-10-CM | POA: Diagnosis not present

## 2020-05-05 DIAGNOSIS — I480 Paroxysmal atrial fibrillation: Secondary | ICD-10-CM

## 2020-05-05 NOTE — Patient Instructions (Addendum)
Medication Instructions:  Your physician recommends that you continue on your current medications as directed. Please refer to the Current Medication list given to you today.  *If you need a refill on your cardiac medications before your next appointment, please call your pharmacy*   Lab Work: None  If you have labs (blood work) drawn today and your tests are completely normal, you will receive your results only by: Marland Kitchen MyChart Message (if you have MyChart) OR . A paper copy in the mail If you have any lab test that is abnormal or we need to change your treatment, we will call you to review the results.   Testing/Procedures: Your physician has requested that you have an echocardiogram. Echocardiography is a painless test that uses sound waves to create images of your heart. It provides your doctor with information about the size and shape of your heart and how well your heart's chambers and valves are working. This procedure takes approximately one hour. There are no restrictions for this procedure.  Your physician has recommended that you wear an event monitor. Event monitors are medical devices that record the heart's electrical activity. Doctors most often [place] these monitors to diagnose arrhythmias. Arrhythmias are problems with the speed or rhythm of the heartbeat. The monitor is a small, portable device. You can wear one while you do your normal daily activities. This is usually used to diagnose what is causing palpitations/syncope (passing out).   Follow-Up: To be determined   Other Instructions [employer] Cardiac Event Monitor Instructions Your physician has requested you wear your cardiac event monitor for 30 days. [employer] may call or text to confirm a shipping address. The monitor will be sent to a land address via UPS. [employer] will not ship a monitor to a PO BOX. It typically takes 3-5 days to receive your monitor after it has been enrolled. [employer] will assist with USPS  tracking if your package is delayed. The telephone number for [employer] is [phone]. Once you have received your monitor, please review the enclosed instructions. Instruction tutorials can also be viewed under help and settings on the enclosed cell phone. Your monitor has already been registered assigning a specific monitor serial # to you.  Applying the monitor Remove cell phone from case and turn it on. The cell phone works as Dealer and needs to be within [employer] of you at all times. The cell phone will need to be charged on a daily basis. We recommend you plug the cell phone into the enclosed charger at your bedside table every night.  Monitor batteries: You will receive two monitor batteries labelled #1 and #2. These are your recorders. Plug battery #2 onto the second connection on the enclosed charger. Keep one battery on the charger at all times. This will keep the monitor battery deactivated. It will also keep it fully charged for when you need to switch your monitor batteries. A small light will be blinking on the battery emblem when it is charging. The light on the battery emblem will remain on when the battery is fully charged.  Open package of a Monitor strip. Insert battery #1 into black hood on strip and gently squeeze monitor battery onto connection as indicated in instruction booklet. Set aside while preparing skin.  Choose location for your strip, vertical or horizontal, as indicated in the instruction booklet. Shave to remove all hair from location. There cannot be any lotions, oils, powders, or colognes on skin where monitor is to be applied. Wipe  skin clean with enclosed Saline wipe. Dry skin completely.  Peel paper labeled #1 off the back of the Monitor strip exposing the adhesive. Place the monitor on the chest in the vertical or horizontal position shown in the instruction booklet. One arrow on the monitor strip must be pointing upward. Carefully  remove paper labeled #2, attaching remainder of strip to your skin. Try not to create any folds or wrinkles in the strip as you apply it.  Firmly press and release the circle in the center of the monitor battery. You will hear a small beep. This is turning the monitor battery on. The heart emblem on the monitor battery will light up every 5 seconds if the monitor battery in turned on and connected to the patient securely. Do not push and hold the circle down as this turns the monitor battery off. The cell phone will locate the monitor battery. A screen will appear on the cell phone checking the connection of your monitor strip. This may read poor connection initially but change to good connection within the next minute. Once your monitor accepts the connection you will hear a series of 3 beeps followed by a climbing crescendo of beeps. A screen will appear on the cell phone showing the two monitor strip placement options. Touch the picture that demonstrates where you applied the monitor strip.  Your monitor strip and battery are waterproof. You are able to shower, bathe, or swim with the monitor on. They just ask you do not submerge deeper than 3 feet underwater. We recommend removing the monitor if you are swimming in a lake, river, or ocean.  Your monitor battery will need to be switched to a fully charged monitor battery approximately once a week. The cell phone will alert you of an action which needs to be made.  On the cell phone, tap for details to reveal connection status, monitor battery status, and cell phone battery status. The green dots indicates your monitor is in good status. A red dot indicates there is something that needs your attention.  To record a symptom, click the circle on the monitor battery. In 30-60 seconds a list of symptoms will appear on the cell phone. Select your symptom and tap save. Your monitor will record a sustained or significant arrhythmia regardless of  you clicking the button. Some patients do not feel the heart rhythm irregularities. [employer] will notify us of any serious or critical events.  Refer to instruction booklet for instructions on switching batteries, changing strips, the Do not disturb or Pause features, or any additional questions.  Call [employer] at [phone], to confirm your monitor is transmitting and record your baseline. They will answer any questions you may have regarding the monitor instructions at that time.  Returning the monitor to [place] all equipment back into blue box. Peel off strip of paper to expose adhesive and close box securely. There is a prepaid UPS shipping label on this box. Drop in a UPS drop box, or at a UPS facility like [employer]. You may also contact [employer] to arrange UPS to pick up monitor package at your home.

## 2020-05-05 NOTE — Telephone Encounter (Signed)
Enrolled patient for a 30 day [employer] Event Monitor to be mailed to patients home  

## 2020-05-09 ENCOUNTER — Encounter: Payer: Self-pay | Admitting: Podiatry

## 2020-05-09 ENCOUNTER — Other Ambulatory Visit: Payer: Self-pay

## 2020-05-09 ENCOUNTER — Ambulatory Visit (INDEPENDENT_AMBULATORY_CARE_PROVIDER_SITE_OTHER): Payer: Medicare Other | Admitting: Podiatry

## 2020-05-09 DIAGNOSIS — E1142 Type 2 diabetes mellitus with diabetic polyneuropathy: Secondary | ICD-10-CM

## 2020-05-09 DIAGNOSIS — M79674 Pain in right toe(s): Secondary | ICD-10-CM

## 2020-05-09 DIAGNOSIS — B351 Tinea unguium: Secondary | ICD-10-CM

## 2020-05-09 DIAGNOSIS — M79675 Pain in left toe(s): Secondary | ICD-10-CM

## 2020-05-12 ENCOUNTER — Encounter (INDEPENDENT_AMBULATORY_CARE_PROVIDER_SITE_OTHER): Payer: Medicare Other

## 2020-05-12 DIAGNOSIS — I4891 Unspecified atrial fibrillation: Secondary | ICD-10-CM | POA: Diagnosis not present

## 2020-05-12 DIAGNOSIS — I480 Paroxysmal atrial fibrillation: Secondary | ICD-10-CM

## 2020-05-12 NOTE — Progress Notes (Signed)
Subjective: Daniel Ochoa presents today at risk foot care. Pt has h/o NIDDM with chronic kidney disease and painful mycotic nails b/l that are difficult to trim. Pain interferes with ambulation. Aggravating factors include wearing enclosed shoe gear. Pain is relieved with periodic professional debridement.  His wife is present during the visit. They voice no new pedal concerns on today's visit. Last A1c was 7.1%.  Daniel Battles, MD is patient's PCP. Last visit was: 08/07/2019.  Past Medical History:  Diagnosis Date  . Acute deep vein thrombosis (DVT) of tibial vein of right lower extremity ([place]) 04/13/2014  . Acute pulmonary embolism ([place]) 04/15/2014  . Arthritis   . Chronic fatigue   . Chronic kidney disease, stage IV (severe) ([place])   . Complication of anesthesia    one surgery took 4 people to hold him down 2000  . Constipation   . Diabetic peripheral neuropathy ([place]) 10/26/2015  . DM (diabetes mellitus), type 2, uncontrolled, with renal complications ([place])    [facility]  . DVT (deep venous thrombosis) ([place])    RLE DVT with intermediate risk VQ scan for PE 03/2014  . Dyspnea   . Essential hypertension   . Fibromyalgia   . Hypothyroidism   . Hypoxia 04/13/2014  . Neuropathy   . Obesity   . OSA on CPAP   . Pulmonary embolism ([place]) 04/13/2014   VQ 04/13/14 intermediate assoc with R DVT > on coumadin since Echo 04/14/14 > PA peak pressure: 89 mm Hg (S).      Current Outpatient Medications on File Prior to Visit  Medication Sig Dispense Refill  . Accu-Chek Softclix Lancets lancets USE TO CHECK BLOOD SUGAR UP TO TID E11.29    . amLODipine (NORVASC) 5 MG tablet Take 5 mg by mouth daily.     . Ascorbic Acid (VITAMIN C) 1000 MG tablet Take 1,000 mg by mouth daily.    Marland Kitchen atenolol (TENORMIN) 50 MG tablet Take 50 mg by mouth 2 (two) times daily.     . Calcium Carb-Cholecalciferol (CALCIUM 600 + D PO) Take 1 tablet by mouth 2 (two) times daily.    . calcium carbonate (OS-CAL) 1250  (500 Ca) MG chewable tablet Chew by mouth.    . clonazePAM (KLONOPIN) 1 MG tablet Take 1 mg by mouth at bedtime.     . DYMISTA 137-50 MCG/ACT SUSP Place 1 spray into both nostrils 2 (two) times daily.     . famotidine (PEPCID) 40 MG tablet Take 40 mg by mouth at bedtime.     Marland Kitchen FREESTYLE TEST STRIPS test strip USE AS DIRECTED TO TEST BLOOD SUGAR 5 TIMES PER DAY. DX: E11.65  11  . furosemide (LASIX) 40 MG tablet Take 40 mg by mouth 2 (two) times daily.     Marland Kitchen HUMALOG 100 UNIT/ML injection USE IN OMNIPOD DAILY (AVERAGE OF 110 UNITS/DAY) **E11.29  2  . HYDROcodone-acetaminophen (NORCO/VICODIN) 5-325 MG tablet TAKE 1 TABLET BY MOUTH 4 TIMES DAILY AS NEEDED FOR MODERATE PAIN.  0  . Lancets Misc. (ACCU-CHEK FASTCLIX LANCET) KIT USE TO CHECK BLOOD SUGAR UP TO TID E11.29    . mupirocin cream (BACTROBAN) 2 % Apply 1 application topically daily. 30 g 0  . nystatin (MYCOSTATIN) 500000 units TABS tablet Take 1 tablet by mouth daily as needed. Uses for heat rash    . SYNTHROID 200 MCG tablet Take 200 mcg by mouth See admin instructions. Takes the 200 mcg daily except on Sundays, pt takes 300 mcg (or 1.5 tablets)  No current facility-administered medications on file prior to visit.     Allergies  Allergen Reactions  . Ace Inhibitors Shortness Of Breath    choking  . Statins Other (See Comments)    Elevated CK level  . Latex Rash    Severe blistery rash  . Sudafed [Pseudoephedrine Hcl] Rash    Objective: Daniel Ochoa is a pleasant 79 y.o. Caucasian male, morbidly obese, in NAD. AAO x 3.  There were no vitals filed for this visit.  Vascular Examination: Neurovascular status unchanged b/l. Capillary fill time to digits <3 seconds b/l. Palpable DP pulses b/l. Palpable PT pulses b/l. Pedal hair absent b/l Skin temperature gradient within normal limits b/l. Nonpitting edema noted b/l LE.  Dermatological Examination: Pedal skin with normal turgor, texture and tone bilaterally. No open wounds  bilaterally. No interdigital macerations bilaterally. Toenails 2-5 bilaterally elongated, dystrophic, thickened, and crumbly with subungual debris and tenderness to dorsal palpation. Left hallux nail plate growing proximal 1/3 of digit. Nailbed intact with no open wounds. Right hallux nail plate adequate length on today's visit.  Musculoskeletal: Normal muscle strength 5/5 to all lower extremity muscle groups bilaterally. No pain crepitus or joint limitation noted with ROM b/l. Hallux valgus with bunion deformity noted b/l. Hammertoes noted to the 2-5 bilaterally.  Neurological Examination: Protective sensation intact 5/5 intact bilaterally with 10g monofilament b/l. Vibratory sensation diminished b/l. Proprioception intact bilaterally. Clonus negative b/l.  Assessment: 1. Pain due to onychomycosis of toenails of both feet   2. Diabetic peripheral neuropathy associated with type 2 diabetes mellitus ([place])    Plan: -Examined patient. -No new findings. No new orders. -Continue diabetic foot care principles.Wife is very involved in daily foot checks for any new developments on his feet. He has a tube of Mupirocin Ointment that she applies if he has any abrasions or superficial injuries.She makes sure he schedules an appointment if any new developments arise. -Toenails 2-5 bilaterally debrided in length and girth without iatrogenic bleeding with sterile nail nipper and dremel.  -Patient to report any pedal injuries to medical professional immediately. -Patient to continue soft, supportive shoe gear daily. -Patient/POA to call should there be question/concern in the interim.  Followup 10 weeks.  Marzetta Board, DPM

## 2020-05-18 ENCOUNTER — Other Ambulatory Visit: Payer: Self-pay

## 2020-05-18 ENCOUNTER — Ambulatory Visit (HOSPITAL_COMMUNITY): Payer: Medicare Other | Attending: Cardiovascular Disease

## 2020-05-18 DIAGNOSIS — I480 Paroxysmal atrial fibrillation: Secondary | ICD-10-CM | POA: Insufficient documentation

## 2020-05-18 LAB — ECHOCARDIOGRAM COMPLETE
Area-P 1/2: 3.5 cm2
S' Lateral: 3.8 cm

## 2020-06-08 DIAGNOSIS — I120 Hypertensive chronic kidney disease with stage 5 chronic kidney disease or end stage renal disease: Secondary | ICD-10-CM | POA: Diagnosis not present

## 2020-06-08 DIAGNOSIS — D631 Anemia in chronic kidney disease: Secondary | ICD-10-CM | POA: Diagnosis not present

## 2020-06-08 DIAGNOSIS — E1122 Type 2 diabetes mellitus with diabetic chronic kidney disease: Secondary | ICD-10-CM | POA: Diagnosis not present

## 2020-06-08 DIAGNOSIS — N185 Chronic kidney disease, stage 5: Secondary | ICD-10-CM | POA: Diagnosis not present

## 2020-06-08 DIAGNOSIS — R609 Edema, unspecified: Secondary | ICD-10-CM | POA: Diagnosis not present

## 2020-06-14 DIAGNOSIS — N390 Urinary tract infection, site not specified: Secondary | ICD-10-CM | POA: Diagnosis not present

## 2020-06-14 DIAGNOSIS — H6123 Impacted cerumen, bilateral: Secondary | ICD-10-CM | POA: Diagnosis not present

## 2020-06-15 ENCOUNTER — Other Ambulatory Visit: Payer: Self-pay

## 2020-06-15 DIAGNOSIS — I480 Paroxysmal atrial fibrillation: Secondary | ICD-10-CM

## 2020-06-24 DIAGNOSIS — Z23 Encounter for immunization: Secondary | ICD-10-CM | POA: Diagnosis not present

## 2020-06-27 DIAGNOSIS — G4733 Obstructive sleep apnea (adult) (pediatric): Secondary | ICD-10-CM | POA: Diagnosis not present

## 2020-06-27 DIAGNOSIS — M6281 Muscle weakness (generalized): Secondary | ICD-10-CM | POA: Diagnosis not present

## 2020-06-27 DIAGNOSIS — N184 Chronic kidney disease, stage 4 (severe): Secondary | ICD-10-CM | POA: Diagnosis not present

## 2020-06-27 DIAGNOSIS — I480 Paroxysmal atrial fibrillation: Secondary | ICD-10-CM | POA: Diagnosis not present

## 2020-06-27 DIAGNOSIS — Z23 Encounter for immunization: Secondary | ICD-10-CM | POA: Diagnosis not present

## 2020-06-27 DIAGNOSIS — I872 Venous insufficiency (chronic) (peripheral): Secondary | ICD-10-CM | POA: Diagnosis not present

## 2020-06-27 DIAGNOSIS — Z794 Long term (current) use of insulin: Secondary | ICD-10-CM | POA: Diagnosis not present

## 2020-06-27 DIAGNOSIS — E1129 Type 2 diabetes mellitus with other diabetic kidney complication: Secondary | ICD-10-CM | POA: Diagnosis not present

## 2020-06-27 DIAGNOSIS — I129 Hypertensive chronic kidney disease with stage 1 through stage 4 chronic kidney disease, or unspecified chronic kidney disease: Secondary | ICD-10-CM | POA: Diagnosis not present

## 2020-06-29 DIAGNOSIS — H5022 Vertical strabismus, left eye: Secondary | ICD-10-CM | POA: Diagnosis not present

## 2020-06-29 DIAGNOSIS — H353131 Nonexudative age-related macular degeneration, bilateral, early dry stage: Secondary | ICD-10-CM | POA: Diagnosis not present

## 2020-06-29 DIAGNOSIS — H25812 Combined forms of age-related cataract, left eye: Secondary | ICD-10-CM | POA: Diagnosis not present

## 2020-06-29 DIAGNOSIS — Z961 Presence of intraocular lens: Secondary | ICD-10-CM | POA: Diagnosis not present

## 2020-06-29 DIAGNOSIS — H5052 Exophoria: Secondary | ICD-10-CM | POA: Diagnosis not present

## 2020-06-29 DIAGNOSIS — H18593 Other hereditary corneal dystrophies, bilateral: Secondary | ICD-10-CM | POA: Diagnosis not present

## 2020-06-29 DIAGNOSIS — E119 Type 2 diabetes mellitus without complications: Secondary | ICD-10-CM | POA: Diagnosis not present

## 2020-06-29 DIAGNOSIS — H04123 Dry eye syndrome of bilateral lacrimal glands: Secondary | ICD-10-CM | POA: Diagnosis not present

## 2020-07-18 ENCOUNTER — Encounter: Payer: Self-pay | Admitting: Internal Medicine

## 2020-07-18 ENCOUNTER — Other Ambulatory Visit: Payer: Self-pay

## 2020-07-18 ENCOUNTER — Ambulatory Visit (INDEPENDENT_AMBULATORY_CARE_PROVIDER_SITE_OTHER): Payer: Medicare Other | Admitting: Internal Medicine

## 2020-07-18 VITALS — BP 142/60 | HR 60 | Ht 73.0 in | Wt 278.6 lb

## 2020-07-18 DIAGNOSIS — I480 Paroxysmal atrial fibrillation: Secondary | ICD-10-CM | POA: Diagnosis not present

## 2020-07-18 NOTE — Progress Notes (Signed)
Electrophysiology Office Note   Date:  07/18/2020   ID:  Daniel, Ochoa [birth year], MRN [id number]  PCP:  Daniel Battles, MD  Cardiologist:  Dr Irish Lack Primary Electrophysiologist: Daniel Grayer, MD    CC: afib   History of Present Illness: Daniel Ochoa is a 79 y.o. male who presents today for electrophysiology evaluation.   He is referred by Dr Irish Lack for further evaluation and management of his afib. He was found to have afib on a sleep study in 2020.  He is unaware.  He has obesity and also OSA.  He uses CPAP.  He is mostly unaware of his afib. He is on coumadin but would like to come off of this.  He has a h/o prior DVT/PTE after a long car trip many years ago. Given the single documented episode of afib 01/6313, there is uncertaintly about treatment strategy for afib going forward.  Today, he denies symptoms of palpitations, chest pain, shortness of breath, orthopnea, PND, lower extremity edema, claudication, dizziness, presyncope, syncope, bleeding, or neurologic sequela. The patient is tolerating medications without difficulties and is otherwise without complaint today.    Past Medical History:  Diagnosis Date  . Acute deep vein thrombosis (DVT) of tibial vein of right lower extremity ([place]) 04/13/2014  . Acute pulmonary embolism ([place]) 04/15/2014  . Arthritis   . Chronic fatigue   . Chronic kidney disease, stage IV (severe) ([place])   . Complication of anesthesia    one surgery took 4 people to hold him down 2000  . Constipation   . Diabetic peripheral neuropathy ([place]) 10/26/2015  . DM (diabetes mellitus), type 2, uncontrolled, with renal complications ([place])    [facility]  . DVT (deep venous thrombosis) ([place])    RLE DVT with intermediate risk VQ scan for PE 03/2014  . Dyspnea   . Essential hypertension   . Fibromyalgia   . Hypothyroidism   . Hypoxia 04/13/2014  . Neuropathy   . Obesity   . OSA on CPAP   . Pulmonary embolism (Daniel Ochoa) 04/13/2014   VQ  04/13/14 intermediate assoc with R DVT > on coumadin since Echo 04/14/14 > PA peak pressure: 89 mm Hg (S).    Past Surgical History:  Procedure Laterality Date  . AV FISTULA PLACEMENT Left 04/18/2017   Procedure: ARTERIOVENOUS (AV) FISTULA CREATION;  Surgeon: Angelia Mould, MD;  Location: [facility];  Service: Vascular;  Laterality: Left;  . CERVICAL LAMINECTOMY    . LUMBAR FUSION     x 2  . ROTATOR CUFF REPAIR Right   . THYROIDECTOMY, PARTIAL       Current Outpatient Medications  Medication Sig Dispense Refill  . Accu-Chek Softclix Lancets lancets USE TO CHECK BLOOD SUGAR UP TO TID E11.29    . amLODipine (NORVASC) 5 MG tablet Take 5 mg by mouth daily.     . Ascorbic Acid (VITAMIN C) 1000 MG tablet Take 1,000 mg by mouth daily.    Marland Kitchen atenolol (TENORMIN) 50 MG tablet Take 50 mg by mouth 2 (two) times daily.     . Calcium Carb-Cholecalciferol (CALCIUM 600 + D PO) Take 1 tablet by mouth 2 (two) times daily.    . clonazePAM (KLONOPIN) 1 MG tablet Take 1 mg by mouth at bedtime.     . DYMISTA 137-50 MCG/ACT SUSP Place 1 spray into both nostrils 2 (two) times daily.     . famotidine (PEPCID) 40 MG tablet Take 40 mg by mouth at bedtime.     Marland Kitchen  FREESTYLE TEST STRIPS test strip USE AS DIRECTED TO TEST BLOOD SUGAR 5 TIMES PER DAY. DX: E11.65  11  . furosemide (LASIX) 40 MG tablet Take 40 mg by mouth 2 (two) times daily.     Marland Kitchen HUMALOG 100 UNIT/ML injection USE IN OMNIPOD DAILY (AVERAGE OF 110 UNITS/DAY) **E11.29  2  . HYDROcodone-acetaminophen (NORCO/VICODIN) 5-325 MG tablet TAKE 1 TABLET BY MOUTH 4 TIMES DAILY AS NEEDED FOR MODERATE PAIN.  0  . Lancets Misc. (ACCU-CHEK FASTCLIX LANCET) KIT USE TO CHECK BLOOD SUGAR UP TO TID E11.29    . nystatin (MYCOSTATIN) 500000 units TABS tablet Take 1 tablet by mouth daily as needed. Uses for heat rash    . SYNTHROID 200 MCG tablet Take 200 mcg by mouth See admin instructions. Takes the 200 mcg daily except on Sundays, pt takes 300 mcg (or 1.5 tablets)     No  current facility-administered medications for this visit.    Allergies:   Ace inhibitors, Statins, Latex, and Sudafed [pseudoephedrine hcl]   Social History:  The patient  reports that he has quit smoking. His smoking use included pipe. He quit after 20.00 years of use. He has never used smokeless tobacco. He reports that he does not drink alcohol and does not use drugs.   Family History:  The patient's family history includes Cancer in his father.    ROS:  Please see the history of present illness.   All other systems are personally reviewed and negative.    PHYSICAL EXAM: VS:  BP (!) 142/60   Pulse 60   Ht '6\' 1"'  (1.854 m)   Wt 278 lb 9.6 oz (126.4 kg)   SpO2 94%   BMI 36.76 kg/m  , BMI Body mass index is 36.76 kg/m. GEN: obese, in no acute distress HEENT: normal Neck: no JVD, carotid bruits, or masses Cardiac: RRR; no murmurs, rubs, or gallops,+ edema  Respiratory:  clear to auscultation bilaterally, normal work of breathing GI: soft, nontender, nondistended, + BS MS: no deformity or atrophy Skin: warm and dry  Neuro:  Strength and sensation are intact Psych: euthymic mood, full affect     Wt Readings from Last 3 Encounters:  07/18/20 278 lb 9.6 oz (126.4 kg)  05/05/20 279 lb (126.6 kg)  04/12/20 (!) 277 lb (125.6 kg)     Other studies personally reviewed: Additional studies/ records that were reviewed today include: Dr Hassell Done notes  Review of the above records today demonstrates: as above  Echo 05/18/2020- EF 60%, mild LA enlargement   ASSESSMENT AND PLAN:  1.  H/o afib We discussed at length today options of implantation of an ILR to further evaluate for afib and definitively guide Dr Hassell Done treatment strategy going forward. At this time, the patient is very clear that he does not wish to proceed with ILR implantation. He states "I just have too much going on right now".    Follow-up with Dr Irish Lack as scheduled I will see as needed  rrent medicines  are reviewed at length with the patient today.   The patient does not have concerns regarding his medicines.  The following changes were made today:  none  Labs/ tests ordered today include:  No orders of the defined types were placed in this encounter.    Army Fossa, MD  07/18/2020 12:16 PM     [address] [place] [address] [phone] (office) [phone] (fax)

## 2020-07-18 NOTE — Patient Instructions (Signed)
Medication Instructions:  Your physician recommends that you continue on your current medications as directed. Please refer to the Current Medication list given to you today.  *If you need a refill on your cardiac medications before your next appointment, please call your pharmacy*  Lab Work: None ordered.  If you have labs (blood work) drawn today and your tests are completely normal, you will receive your results only by: . MyChart Message (if you have MyChart) OR . A paper copy in the mail If you have any lab test that is abnormal or we need to change your treatment, we will call you to review the results.  Testing/Procedures: None ordered.  Follow-Up: At [facility], you and your health needs are our priority.  As part of our continuing mission to provide you with exceptional heart care, we have created designated Provider Care Teams.  These Care Teams include your primary Cardiologist (physician) and Advanced Practice Providers (APPs -  Physician Assistants and Nurse Practitioners) who all work together to provide you with the care you need, when you need it.  We recommend signing up for the patient portal called "MyChart".  Sign up information is provided on this After Visit Summary.  MyChart is used to connect with patients for Virtual Visits (Telemedicine).  Patients are able to view lab/test results, encounter notes, upcoming appointments, etc.  Non-urgent messages can be sent to your provider as well.   To learn more about what you can do with MyChart, go to [email or web address].    Your next appointment:   Your physician wants you to follow-up in: As needed.    Other Instructions:  

## 2020-07-25 DIAGNOSIS — N390 Urinary tract infection, site not specified: Secondary | ICD-10-CM | POA: Diagnosis not present

## 2020-07-25 DIAGNOSIS — N185 Chronic kidney disease, stage 5: Secondary | ICD-10-CM | POA: Diagnosis not present

## 2020-07-28 DIAGNOSIS — M25552 Pain in left hip: Secondary | ICD-10-CM | POA: Diagnosis not present

## 2020-07-28 DIAGNOSIS — M545 Low back pain, unspecified: Secondary | ICD-10-CM | POA: Diagnosis not present

## 2020-08-03 DIAGNOSIS — I120 Hypertensive chronic kidney disease with stage 5 chronic kidney disease or end stage renal disease: Secondary | ICD-10-CM | POA: Diagnosis not present

## 2020-08-03 DIAGNOSIS — N390 Urinary tract infection, site not specified: Secondary | ICD-10-CM | POA: Diagnosis not present

## 2020-08-03 DIAGNOSIS — D631 Anemia in chronic kidney disease: Secondary | ICD-10-CM | POA: Diagnosis not present

## 2020-08-03 DIAGNOSIS — E1122 Type 2 diabetes mellitus with diabetic chronic kidney disease: Secondary | ICD-10-CM | POA: Diagnosis not present

## 2020-08-03 DIAGNOSIS — N185 Chronic kidney disease, stage 5: Secondary | ICD-10-CM | POA: Diagnosis not present

## 2020-08-03 DIAGNOSIS — R609 Edema, unspecified: Secondary | ICD-10-CM | POA: Diagnosis not present

## 2020-08-04 DIAGNOSIS — G4733 Obstructive sleep apnea (adult) (pediatric): Secondary | ICD-10-CM | POA: Diagnosis not present

## 2020-08-05 DIAGNOSIS — H25812 Combined forms of age-related cataract, left eye: Secondary | ICD-10-CM | POA: Diagnosis not present

## 2020-08-09 ENCOUNTER — Ambulatory Visit: Payer: Medicare Other | Admitting: Podiatry

## 2020-08-15 DIAGNOSIS — E0590 Thyrotoxicosis, unspecified without thyrotoxic crisis or storm: Secondary | ICD-10-CM | POA: Diagnosis not present

## 2020-08-15 DIAGNOSIS — N184 Chronic kidney disease, stage 4 (severe): Secondary | ICD-10-CM | POA: Diagnosis not present

## 2020-08-15 DIAGNOSIS — S22089D Unspecified fracture of T11-T12 vertebra, subsequent encounter for fracture with routine healing: Secondary | ICD-10-CM | POA: Diagnosis not present

## 2020-08-15 DIAGNOSIS — Z86711 Personal history of pulmonary embolism: Secondary | ICD-10-CM | POA: Diagnosis not present

## 2020-08-15 DIAGNOSIS — M8580 Other specified disorders of bone density and structure, unspecified site: Secondary | ICD-10-CM | POA: Diagnosis not present

## 2020-08-15 DIAGNOSIS — I129 Hypertensive chronic kidney disease with stage 1 through stage 4 chronic kidney disease, or unspecified chronic kidney disease: Secondary | ICD-10-CM | POA: Diagnosis not present

## 2020-08-15 DIAGNOSIS — Z794 Long term (current) use of insulin: Secondary | ICD-10-CM | POA: Diagnosis not present

## 2020-08-15 DIAGNOSIS — Z9181 History of falling: Secondary | ICD-10-CM | POA: Diagnosis not present

## 2020-08-15 DIAGNOSIS — M1612 Unilateral primary osteoarthritis, left hip: Secondary | ICD-10-CM | POA: Diagnosis not present

## 2020-08-15 DIAGNOSIS — M5136 Other intervertebral disc degeneration, lumbar region: Secondary | ICD-10-CM | POA: Diagnosis not present

## 2020-08-15 DIAGNOSIS — G4730 Sleep apnea, unspecified: Secondary | ICD-10-CM | POA: Diagnosis not present

## 2020-08-15 DIAGNOSIS — Z4789 Encounter for other orthopedic aftercare: Secondary | ICD-10-CM | POA: Diagnosis not present

## 2020-08-15 DIAGNOSIS — E1122 Type 2 diabetes mellitus with diabetic chronic kidney disease: Secondary | ICD-10-CM | POA: Diagnosis not present

## 2020-08-17 DIAGNOSIS — M545 Low back pain, unspecified: Secondary | ICD-10-CM | POA: Diagnosis not present

## 2020-08-19 DIAGNOSIS — G4730 Sleep apnea, unspecified: Secondary | ICD-10-CM | POA: Diagnosis not present

## 2020-08-19 DIAGNOSIS — M5136 Other intervertebral disc degeneration, lumbar region: Secondary | ICD-10-CM | POA: Diagnosis not present

## 2020-08-19 DIAGNOSIS — E0590 Thyrotoxicosis, unspecified without thyrotoxic crisis or storm: Secondary | ICD-10-CM | POA: Diagnosis not present

## 2020-08-19 DIAGNOSIS — S22089D Unspecified fracture of T11-T12 vertebra, subsequent encounter for fracture with routine healing: Secondary | ICD-10-CM | POA: Diagnosis not present

## 2020-08-19 DIAGNOSIS — I129 Hypertensive chronic kidney disease with stage 1 through stage 4 chronic kidney disease, or unspecified chronic kidney disease: Secondary | ICD-10-CM | POA: Diagnosis not present

## 2020-08-19 DIAGNOSIS — M1612 Unilateral primary osteoarthritis, left hip: Secondary | ICD-10-CM | POA: Diagnosis not present

## 2020-08-22 ENCOUNTER — Other Ambulatory Visit: Payer: Self-pay | Admitting: *Deleted

## 2020-08-22 DIAGNOSIS — G4730 Sleep apnea, unspecified: Secondary | ICD-10-CM | POA: Diagnosis not present

## 2020-08-22 DIAGNOSIS — S22089D Unspecified fracture of T11-T12 vertebra, subsequent encounter for fracture with routine healing: Secondary | ICD-10-CM | POA: Diagnosis not present

## 2020-08-22 DIAGNOSIS — N184 Chronic kidney disease, stage 4 (severe): Secondary | ICD-10-CM

## 2020-08-22 DIAGNOSIS — M5136 Other intervertebral disc degeneration, lumbar region: Secondary | ICD-10-CM | POA: Diagnosis not present

## 2020-08-22 DIAGNOSIS — E0590 Thyrotoxicosis, unspecified without thyrotoxic crisis or storm: Secondary | ICD-10-CM | POA: Diagnosis not present

## 2020-08-22 DIAGNOSIS — I129 Hypertensive chronic kidney disease with stage 1 through stage 4 chronic kidney disease, or unspecified chronic kidney disease: Secondary | ICD-10-CM | POA: Diagnosis not present

## 2020-08-22 DIAGNOSIS — M1612 Unilateral primary osteoarthritis, left hip: Secondary | ICD-10-CM | POA: Diagnosis not present

## 2020-08-24 DIAGNOSIS — I129 Hypertensive chronic kidney disease with stage 1 through stage 4 chronic kidney disease, or unspecified chronic kidney disease: Secondary | ICD-10-CM | POA: Diagnosis not present

## 2020-08-24 DIAGNOSIS — M1612 Unilateral primary osteoarthritis, left hip: Secondary | ICD-10-CM | POA: Diagnosis not present

## 2020-08-24 DIAGNOSIS — E0590 Thyrotoxicosis, unspecified without thyrotoxic crisis or storm: Secondary | ICD-10-CM | POA: Diagnosis not present

## 2020-08-24 DIAGNOSIS — G4730 Sleep apnea, unspecified: Secondary | ICD-10-CM | POA: Diagnosis not present

## 2020-08-24 DIAGNOSIS — M5136 Other intervertebral disc degeneration, lumbar region: Secondary | ICD-10-CM | POA: Diagnosis not present

## 2020-08-24 DIAGNOSIS — S22089D Unspecified fracture of T11-T12 vertebra, subsequent encounter for fracture with routine healing: Secondary | ICD-10-CM | POA: Diagnosis not present

## 2020-08-29 DIAGNOSIS — M1612 Unilateral primary osteoarthritis, left hip: Secondary | ICD-10-CM | POA: Diagnosis not present

## 2020-08-29 DIAGNOSIS — M5136 Other intervertebral disc degeneration, lumbar region: Secondary | ICD-10-CM | POA: Diagnosis not present

## 2020-08-29 DIAGNOSIS — E0590 Thyrotoxicosis, unspecified without thyrotoxic crisis or storm: Secondary | ICD-10-CM | POA: Diagnosis not present

## 2020-08-29 DIAGNOSIS — I129 Hypertensive chronic kidney disease with stage 1 through stage 4 chronic kidney disease, or unspecified chronic kidney disease: Secondary | ICD-10-CM | POA: Diagnosis not present

## 2020-08-29 DIAGNOSIS — G4730 Sleep apnea, unspecified: Secondary | ICD-10-CM | POA: Diagnosis not present

## 2020-08-29 DIAGNOSIS — S22089D Unspecified fracture of T11-T12 vertebra, subsequent encounter for fracture with routine healing: Secondary | ICD-10-CM | POA: Diagnosis not present

## 2020-08-31 ENCOUNTER — Ambulatory Visit (HOSPITAL_COMMUNITY)
Admission: RE | Admit: 2020-08-31 | Discharge: 2020-08-31 | Disposition: A | Discharge status: Home / Self Care | Payer: Medicare Other | Source: Ambulatory Visit | Attending: Vascular Surgery | Admitting: Vascular Surgery

## 2020-08-31 ENCOUNTER — Other Ambulatory Visit: Payer: Self-pay

## 2020-08-31 ENCOUNTER — Encounter: Payer: Self-pay | Admitting: Vascular Surgery

## 2020-08-31 ENCOUNTER — Ambulatory Visit (INDEPENDENT_AMBULATORY_CARE_PROVIDER_SITE_OTHER)
Admission: RE | Admit: 2020-08-31 | Discharge: 2020-08-31 | Disposition: A | Discharge status: Home / Self Care | Payer: Medicare Other | Source: Ambulatory Visit | Attending: Vascular Surgery | Admitting: Vascular Surgery

## 2020-08-31 ENCOUNTER — Ambulatory Visit (INDEPENDENT_AMBULATORY_CARE_PROVIDER_SITE_OTHER): Payer: Medicare Other | Admitting: Vascular Surgery

## 2020-08-31 VITALS — BP 158/64 | HR 59 | Temp 98.3°F | Resp 20 | Ht 73.0 in | Wt 278.0 lb

## 2020-08-31 DIAGNOSIS — N184 Chronic kidney disease, stage 4 (severe): Secondary | ICD-10-CM | POA: Diagnosis not present

## 2020-08-31 NOTE — Progress Notes (Signed)
REASON FOR CONSULT:    To evaluate for new access.  The consult is requested by Dr. Augustin Coupe.  ASSESSMENT & PLAN:   STAGE V CHRONIC KIDNEY DISEASE: I do not think the patient would be a good candidate for first rib resection and subsequent intervention on the left subclavian vein occlusion and cephalic vein stenosis.  Therefore I would recommend placement of new access in the right arm.  We have been asked to not place an AV graft and only place an AV fistula if possible.  Based on his vein map his only option for a fistula on the right would be a basilic vein transposition.  I explained that we would do this in 2 stages.  I have discussed the indications of the procedure and the potential complications and he is agreeable to proceed.  This has been scheduled for Tuesday, 09/20/2020.  [name], MD Office: [phone]   HPI:   Daniel Ochoa is a pleasant 79 y.o. male, who presents for evaluation for new access.  I created a left brachiocephalic fistula on this patient in August 2018.  He underwent a fistulogram reportedly by Dr. Augustin Coupe which showed 100% subclavian stenosis on the left and also a cephalic vein stenosis.  It was felt that he would either need a first rib resection versus new access in the right arm.  According to the records from the referring office we will place an AV fistula but if this were not possible wait before placing an AV graft.  According to the records he has also undergone previous angioplasty of a left subclavian vein stenosis with up to a 12 mm balloon.  He is also had previous venoplasty of the cephalic arch stenosis.  The patient is not on dialysis.  He denies any significant pain or paresthesias in his upper extremities.  He denies any recent uremic symptoms.  Specifically, he denies nausea, vomiting, fatigue, anorexia, or palpitations.  Past Medical History:  Diagnosis Date  . Acute deep vein thrombosis (DVT) of tibial vein of right lower extremity  ([place]) 04/13/2014  . Acute pulmonary embolism ([place]) 04/15/2014  . Arthritis   . Chronic fatigue   . Chronic kidney disease, stage IV (severe) ([place])   . Complication of anesthesia    one surgery took 4 people to hold him down 2000  . Constipation   . Diabetic peripheral neuropathy ([place]) 10/26/2015  . DM (diabetes mellitus), type 2, uncontrolled, with renal complications ([place])    [facility]  . DVT (deep venous thrombosis) ([place])    RLE DVT with intermediate risk VQ scan for PE 03/2014  . Dyspnea   . Essential hypertension   . Fibromyalgia   . Hypothyroidism   . Hypoxia 04/13/2014  . Neuropathy   . Obesity   . OSA on CPAP   . Pulmonary embolism ([place]) 04/13/2014   VQ 04/13/14 intermediate assoc with R DVT > on coumadin since Echo 04/14/14 > PA peak pressure: 89 mm Hg (S).     Family History  Problem Relation Age of Onset  . Cancer Father     SOCIAL HISTORY: Social History   Socioeconomic History  . Marital status: Married    Spouse name: Not on file  . Number of children: 1  . Years of education: Not on file  . Highest education level: Not on file  Occupational History  . Not on file  Tobacco Use  . Smoking status: Former Smoker    Years: 20.00  Types: Pipe  . Smokeless tobacco: Never Used  . Tobacco comment: quit in 1993  Vaping Use  . Vaping Use: Never used  Substance and Sexual Activity  . Alcohol use: No  . Drug use: No  . Sexual activity: Not on file  Other Topics Concern  . Not on file  Social History Narrative  . Not on file   Social Determinants of Health   Financial Resource Strain: Not on file  Food Insecurity: Not on file  Transportation Needs: Not on file  Physical Activity: Not on file  Stress: Not on file  Social Connections: Not on file  Intimate Partner Violence: Not on file    Allergies  Allergen Reactions  . Ace Inhibitors Shortness Of Breath    choking  . Statins Other (See Comments)    Elevated CK level  . Latex Rash    Severe  blistery rash  . Sudafed [Pseudoephedrine Hcl] Rash    Current Outpatient Medications  Medication Sig Dispense Refill  . Accu-Chek Softclix Lancets lancets USE TO CHECK BLOOD SUGAR UP TO TID E11.29    . amLODipine (NORVASC) 5 MG tablet Take 5 mg by mouth daily.     . Ascorbic Acid (VITAMIN C) 1000 MG tablet Take 1,000 mg by mouth daily.    Marland Kitchen atenolol (TENORMIN) 50 MG tablet Take 50 mg by mouth 2 (two) times daily.     . Calcium Carb-Cholecalciferol (CALCIUM 600 + D PO) Take 1 tablet by mouth 2 (two) times daily.    . clonazePAM (KLONOPIN) 1 MG tablet Take 1 mg by mouth at bedtime.     . DYMISTA 137-50 MCG/ACT SUSP Place 1 spray into both nostrils 2 (two) times daily.     . famotidine (PEPCID) 40 MG tablet Take 40 mg by mouth at bedtime.     Marland Kitchen FREESTYLE TEST STRIPS test strip USE AS DIRECTED TO TEST BLOOD SUGAR 5 TIMES PER DAY. DX: E11.65  11  . furosemide (LASIX) 40 MG tablet Take 40 mg by mouth 2 (two) times daily.     Marland Kitchen HUMALOG 100 UNIT/ML injection USE IN OMNIPOD DAILY (AVERAGE OF 110 UNITS/DAY) **E11.29  2  . HYDROcodone-acetaminophen (NORCO/VICODIN) 5-325 MG tablet TAKE 1 TABLET BY MOUTH 4 TIMES DAILY AS NEEDED FOR MODERATE PAIN.  0  . Lancets Misc. (ACCU-CHEK FASTCLIX LANCET) KIT USE TO CHECK BLOOD SUGAR UP TO TID E11.29    . nystatin (MYCOSTATIN) 500000 units TABS tablet Take 1 tablet by mouth daily as needed. Uses for heat rash    . SYNTHROID 200 MCG tablet Take 200 mcg by mouth See admin instructions. Takes the 200 mcg daily except on Sundays, pt takes 300 mcg (or 1.5 tablets)     No current facility-administered medications for this visit.    REVIEW OF SYSTEMS:  _0  denotes positive finding, _1  denotes negative finding Cardiac  Comments:  Chest pain or chest pressure:    Shortness of breath upon exertion:    Short of breath when lying flat:    Irregular heart rhythm:        Vascular    Pain in calf, thigh, or hip brought on by ambulation:    Pain in feet at night that  wakes you up from your sleep:     Blood clot in your veins:    Leg swelling:         Pulmonary    Oxygen at home:    Productive cough:     Wheezing:  Neurologic    Sudden weakness in arms or legs:     Sudden numbness in arms or legs:     Sudden onset of difficulty speaking or slurred speech:    Temporary loss of vision in one eye:     Problems with dizziness:         Gastrointestinal    Blood in stool:     Vomited blood:         Genitourinary    Burning when urinating:     Blood in urine:        Psychiatric    Major depression:         Hematologic    Bleeding problems:    Problems with blood clotting too easily:        Skin    Rashes or ulcers:        Constitutional    Fever or chills:     PHYSICAL EXAM:   Vitals:   08/31/20 1403  BP: (!) 158/64  Pulse: (!) 59  Resp: 20  Temp: 98.3 F (36.8 C)  SpO2: 96%  Weight: 278 lb (126.1 kg)  Height: _0  (1.854 m)    GENERAL: The patient is a well-nourished male, in no acute distress. The vital signs are documented above. CARDIAC: There is a regular rate and rhythm.  VASCULAR: I do not detect carotid bruits. He has palpable radial pulses bilaterally. He has significant collaterals in his left shoulder consistent with a subclavian venous occlusion. PULMONARY: There is good air exchange bilaterally without wheezing or rales. ABDOMEN: Soft and non-tender with normal pitched bowel sounds.  MUSCULOSKELETAL: There are no major deformities or cyanosis. NEUROLOGIC: No focal weakness or paresthesias are detected. SKIN: There are no ulcers or rashes noted. PSYCHIATRIC: The patient has a normal affect.  DATA:    UPPER EXTREMITY VEIN MAP: I have independently interpreted his upper extremity vein map.  On the right side the forearm cephalic vein is small.  The upper arm cephalic vein has thrombosed.  The basilic vein on the right looks reasonable in size.  UPPER EXTREMITY ARTERIAL DUPLEX: I have independently  interpreted his upper extremity arterial duplex scan.  On the right side the brachial artery measures 0.62 cm in diameter.  There is a triphasic radial signal and a triphasic ulnar signal

## 2020-09-12 DIAGNOSIS — M1612 Unilateral primary osteoarthritis, left hip: Secondary | ICD-10-CM | POA: Diagnosis not present

## 2020-09-12 DIAGNOSIS — M545 Low back pain, unspecified: Secondary | ICD-10-CM | POA: Diagnosis not present

## 2020-09-12 DIAGNOSIS — M5136 Other intervertebral disc degeneration, lumbar region: Secondary | ICD-10-CM | POA: Diagnosis not present

## 2020-09-12 DIAGNOSIS — E0590 Thyrotoxicosis, unspecified without thyrotoxic crisis or storm: Secondary | ICD-10-CM | POA: Diagnosis not present

## 2020-09-12 DIAGNOSIS — G4730 Sleep apnea, unspecified: Secondary | ICD-10-CM | POA: Diagnosis not present

## 2020-09-12 DIAGNOSIS — I129 Hypertensive chronic kidney disease with stage 1 through stage 4 chronic kidney disease, or unspecified chronic kidney disease: Secondary | ICD-10-CM | POA: Diagnosis not present

## 2020-09-12 DIAGNOSIS — S22089D Unspecified fracture of T11-T12 vertebra, subsequent encounter for fracture with routine healing: Secondary | ICD-10-CM | POA: Diagnosis not present

## 2020-09-14 DIAGNOSIS — E0590 Thyrotoxicosis, unspecified without thyrotoxic crisis or storm: Secondary | ICD-10-CM | POA: Diagnosis not present

## 2020-09-14 DIAGNOSIS — S22089D Unspecified fracture of T11-T12 vertebra, subsequent encounter for fracture with routine healing: Secondary | ICD-10-CM | POA: Diagnosis not present

## 2020-09-14 DIAGNOSIS — G4730 Sleep apnea, unspecified: Secondary | ICD-10-CM | POA: Diagnosis not present

## 2020-09-14 DIAGNOSIS — E1122 Type 2 diabetes mellitus with diabetic chronic kidney disease: Secondary | ICD-10-CM | POA: Diagnosis not present

## 2020-09-14 DIAGNOSIS — Z86711 Personal history of pulmonary embolism: Secondary | ICD-10-CM | POA: Diagnosis not present

## 2020-09-14 DIAGNOSIS — M1612 Unilateral primary osteoarthritis, left hip: Secondary | ICD-10-CM | POA: Diagnosis not present

## 2020-09-14 DIAGNOSIS — M5136 Other intervertebral disc degeneration, lumbar region: Secondary | ICD-10-CM | POA: Diagnosis not present

## 2020-09-14 DIAGNOSIS — N184 Chronic kidney disease, stage 4 (severe): Secondary | ICD-10-CM | POA: Diagnosis not present

## 2020-09-14 DIAGNOSIS — M8580 Other specified disorders of bone density and structure, unspecified site: Secondary | ICD-10-CM | POA: Diagnosis not present

## 2020-09-14 DIAGNOSIS — Z4789 Encounter for other orthopedic aftercare: Secondary | ICD-10-CM | POA: Diagnosis not present

## 2020-09-14 DIAGNOSIS — Z9181 History of falling: Secondary | ICD-10-CM | POA: Diagnosis not present

## 2020-09-14 DIAGNOSIS — Z794 Long term (current) use of insulin: Secondary | ICD-10-CM | POA: Diagnosis not present

## 2020-09-14 DIAGNOSIS — I129 Hypertensive chronic kidney disease with stage 1 through stage 4 chronic kidney disease, or unspecified chronic kidney disease: Secondary | ICD-10-CM | POA: Diagnosis not present

## 2020-09-16 NOTE — Progress Notes (Signed)
Pt's wife called to inform that the pt will be cancelling his surgery scheduled with Dr. Scot Dock on Tues 09/21/19. Wife advised to call Dr. Nicole Cella office on Mon 1/3 to inform them of the cancellation. The covid appointment will be cancelled.

## 2020-09-19 ENCOUNTER — Other Ambulatory Visit (HOSPITAL_COMMUNITY): Payer: Medicare Other

## 2020-09-19 DIAGNOSIS — E0590 Thyrotoxicosis, unspecified without thyrotoxic crisis or storm: Secondary | ICD-10-CM | POA: Diagnosis not present

## 2020-09-19 DIAGNOSIS — M5136 Other intervertebral disc degeneration, lumbar region: Secondary | ICD-10-CM | POA: Diagnosis not present

## 2020-09-19 DIAGNOSIS — S22089D Unspecified fracture of T11-T12 vertebra, subsequent encounter for fracture with routine healing: Secondary | ICD-10-CM | POA: Diagnosis not present

## 2020-09-19 DIAGNOSIS — G4730 Sleep apnea, unspecified: Secondary | ICD-10-CM | POA: Diagnosis not present

## 2020-09-19 DIAGNOSIS — M1612 Unilateral primary osteoarthritis, left hip: Secondary | ICD-10-CM | POA: Diagnosis not present

## 2020-09-19 DIAGNOSIS — I129 Hypertensive chronic kidney disease with stage 1 through stage 4 chronic kidney disease, or unspecified chronic kidney disease: Secondary | ICD-10-CM | POA: Diagnosis not present

## 2020-09-20 ENCOUNTER — Ambulatory Visit (HOSPITAL_COMMUNITY): Admission: RE | Admit: 2020-09-20 | Payer: Medicare Other | Source: Home / Self Care | Admitting: Vascular Surgery

## 2020-09-20 ENCOUNTER — Encounter (HOSPITAL_COMMUNITY): Admission: RE | Payer: Self-pay | Source: Home / Self Care

## 2020-09-20 SURGERY — TRANSPOSITION, VEIN, BASILIC
Anesthesia: Choice | Laterality: Right

## 2020-09-21 DIAGNOSIS — N185 Chronic kidney disease, stage 5: Secondary | ICD-10-CM | POA: Diagnosis not present

## 2020-09-26 DIAGNOSIS — S22089D Unspecified fracture of T11-T12 vertebra, subsequent encounter for fracture with routine healing: Secondary | ICD-10-CM | POA: Diagnosis not present

## 2020-09-26 DIAGNOSIS — E0590 Thyrotoxicosis, unspecified without thyrotoxic crisis or storm: Secondary | ICD-10-CM | POA: Diagnosis not present

## 2020-09-26 DIAGNOSIS — M1612 Unilateral primary osteoarthritis, left hip: Secondary | ICD-10-CM | POA: Diagnosis not present

## 2020-09-26 DIAGNOSIS — G4730 Sleep apnea, unspecified: Secondary | ICD-10-CM | POA: Diagnosis not present

## 2020-09-26 DIAGNOSIS — M5136 Other intervertebral disc degeneration, lumbar region: Secondary | ICD-10-CM | POA: Diagnosis not present

## 2020-09-26 DIAGNOSIS — I129 Hypertensive chronic kidney disease with stage 1 through stage 4 chronic kidney disease, or unspecified chronic kidney disease: Secondary | ICD-10-CM | POA: Diagnosis not present

## 2020-09-27 DIAGNOSIS — M545 Low back pain, unspecified: Secondary | ICD-10-CM | POA: Diagnosis not present

## 2020-09-27 DIAGNOSIS — M5126 Other intervertebral disc displacement, lumbar region: Secondary | ICD-10-CM | POA: Diagnosis not present

## 2020-09-27 DIAGNOSIS — M47816 Spondylosis without myelopathy or radiculopathy, lumbar region: Secondary | ICD-10-CM | POA: Diagnosis not present

## 2020-09-29 DIAGNOSIS — R609 Edema, unspecified: Secondary | ICD-10-CM | POA: Diagnosis not present

## 2020-09-29 DIAGNOSIS — N185 Chronic kidney disease, stage 5: Secondary | ICD-10-CM | POA: Diagnosis not present

## 2020-09-29 DIAGNOSIS — E1122 Type 2 diabetes mellitus with diabetic chronic kidney disease: Secondary | ICD-10-CM | POA: Diagnosis not present

## 2020-09-29 DIAGNOSIS — D631 Anemia in chronic kidney disease: Secondary | ICD-10-CM | POA: Diagnosis not present

## 2020-09-29 DIAGNOSIS — I120 Hypertensive chronic kidney disease with stage 5 chronic kidney disease or end stage renal disease: Secondary | ICD-10-CM | POA: Diagnosis not present

## 2020-09-30 DIAGNOSIS — M545 Low back pain, unspecified: Secondary | ICD-10-CM | POA: Diagnosis not present

## 2020-10-04 ENCOUNTER — Other Ambulatory Visit: Payer: Self-pay | Admitting: *Deleted

## 2020-10-04 DIAGNOSIS — N184 Chronic kidney disease, stage 4 (severe): Secondary | ICD-10-CM

## 2020-10-05 ENCOUNTER — Ambulatory Visit (INDEPENDENT_AMBULATORY_CARE_PROVIDER_SITE_OTHER): Payer: Medicare Other | Admitting: Podiatry

## 2020-10-05 ENCOUNTER — Other Ambulatory Visit: Payer: Self-pay

## 2020-10-05 ENCOUNTER — Encounter: Payer: Self-pay | Admitting: Podiatry

## 2020-10-05 DIAGNOSIS — M79675 Pain in left toe(s): Secondary | ICD-10-CM

## 2020-10-05 DIAGNOSIS — B351 Tinea unguium: Secondary | ICD-10-CM | POA: Diagnosis not present

## 2020-10-05 DIAGNOSIS — E1142 Type 2 diabetes mellitus with diabetic polyneuropathy: Secondary | ICD-10-CM

## 2020-10-05 DIAGNOSIS — M79674 Pain in right toe(s): Secondary | ICD-10-CM | POA: Diagnosis not present

## 2020-10-06 DIAGNOSIS — E0590 Thyrotoxicosis, unspecified without thyrotoxic crisis or storm: Secondary | ICD-10-CM | POA: Diagnosis not present

## 2020-10-06 DIAGNOSIS — M1612 Unilateral primary osteoarthritis, left hip: Secondary | ICD-10-CM | POA: Diagnosis not present

## 2020-10-06 DIAGNOSIS — I129 Hypertensive chronic kidney disease with stage 1 through stage 4 chronic kidney disease, or unspecified chronic kidney disease: Secondary | ICD-10-CM | POA: Diagnosis not present

## 2020-10-06 DIAGNOSIS — M5136 Other intervertebral disc degeneration, lumbar region: Secondary | ICD-10-CM | POA: Diagnosis not present

## 2020-10-06 DIAGNOSIS — G4730 Sleep apnea, unspecified: Secondary | ICD-10-CM | POA: Diagnosis not present

## 2020-10-06 DIAGNOSIS — S22089D Unspecified fracture of T11-T12 vertebra, subsequent encounter for fracture with routine healing: Secondary | ICD-10-CM | POA: Diagnosis not present

## 2020-10-06 NOTE — Progress Notes (Signed)
Subjective: Daniel Ochoa presents today at risk foot care. Pt has h/o NIDDM with chronic kidney disease and painful mycotic nails b/l that are difficult to trim. Pain interferes with ambulation. Aggravating factors include wearing enclosed shoe gear. Pain is relieved with periodic professional debridement.  His wife is present during the visit. Daniel Ochoa states he has been having kidney problems.  Daniel Battles, MD is patient's PCP. Last visit was: 06/27/2020.  Past Medical History:  Diagnosis Date  . Acute deep vein thrombosis (DVT) of tibial vein of right lower extremity ([place]) 04/13/2014  . Acute pulmonary embolism ([place]) 04/15/2014  . Arthritis   . Chronic fatigue   . Chronic kidney disease, stage IV (severe) ([place])   . Complication of anesthesia    one surgery took 4 people to hold him down 2000  . Constipation   . Diabetic peripheral neuropathy ([place]) 10/26/2015  . DM (diabetes mellitus), type 2, uncontrolled, with renal complications ([place])    [facility]  . DVT (deep venous thrombosis) ([place])    RLE DVT with intermediate risk VQ scan for PE 03/2014  . Dyspnea   . Essential hypertension   . Fibromyalgia   . Hypothyroidism   . Hypoxia 04/13/2014  . Neuropathy   . Obesity   . OSA on CPAP   . Pulmonary embolism ([place]) 04/13/2014   VQ 04/13/14 intermediate assoc with R DVT > on coumadin since Echo 04/14/14 > PA peak pressure: 89 mm Hg (S).      Current Outpatient Medications on File Prior to Visit  Medication Sig Dispense Refill  . Accu-Chek Softclix Lancets lancets USE TO CHECK BLOOD SUGAR UP TO TID E11.29    . amLODipine (NORVASC) 5 MG tablet Take 5 mg by mouth daily.     Marland Kitchen atenolol (TENORMIN) 50 MG tablet Take 50 mg by mouth 2 (two) times daily.     . Calcium Carb-Cholecalciferol (CALCIUM 600 + D PO) Take 1 tablet by mouth 2 (two) times daily.    . carboxymethylcellulose (REFRESH PLUS) 0.5 % SOLN Place 1 drop into both eyes 3 (three) times daily.    . clonazePAM (KLONOPIN)  1 MG tablet Take 1 mg by mouth at bedtime.     . DYMISTA 137-50 MCG/ACT SUSP Place 1 spray into both nostrils daily as needed (allergies).    . famotidine (PEPCID) 40 MG tablet Take 40 mg by mouth at bedtime.     Marland Kitchen FREESTYLE TEST STRIPS test strip USE AS DIRECTED TO TEST BLOOD SUGAR 5 TIMES PER DAY. DX: E11.65  11  . furosemide (LASIX) 40 MG tablet Take 40 mg by mouth 2 (two) times daily.     Marland Kitchen HUMALOG 100 UNIT/ML injection Inject into the skin 3 (three) times daily with meals. Pump calculate dosage  2  . HYDROcodone-acetaminophen (NORCO/VICODIN) 5-325 MG tablet Take 1 tablet by mouth in the morning, at noon, and at bedtime.  0  . ipratropium (ATROVENT) 0.03 % nasal spray Place 2 sprays into both nostrils daily as needed for rhinitis.    . Lancets Misc. (ACCU-CHEK FASTCLIX LANCET) KIT USE TO CHECK BLOOD SUGAR UP TO TID E11.29    . SYNTHROID 200 MCG tablet Take 200 mcg by mouth See admin instructions. Takes the 200 mcg daily except on Sundays, pt takes 300 mcg (or 1.5 tablets)    . vitamin C (ASCORBIC ACID) 250 MG tablet Take 750 mg by mouth daily.     No current facility-administered medications on file prior to visit.  Allergies  Allergen Reactions  . Ace Inhibitors Shortness Of Breath    choking  . Statins Other (See Comments)    Elevated CK level  . Latex Rash    Severe blistery rash    Objective: Daniel Ochoa is a pleasant 80 y.o. Caucasian male, morbidly obese, in NAD. AAO x 3.  There were no vitals filed for this visit.  Vascular Examination: Neurovascular status unchanged b/l. Capillary fill time to digits <3 seconds b/l. Palpable DP pulses b/l. Palpable PT pulses b/l. Pedal hair absent b/l Skin temperature gradient within normal limits b/l. Nonpitting edema noted b/l LE.  Dermatological Examination: Pedal skin with normal turgor, texture and tone bilaterally. No open wounds bilaterally. No interdigital macerations bilaterally. Toenails 2-5 bilaterally elongated,  dystrophic, thickened, and crumbly with subungual debris and tenderness to dorsal palpation. Left hallux nail plate growing proximal 1/2 of digit. Nailbed intact with no open wounds. Right hallux nail plate adequate length on today's visit.  Musculoskeletal: Normal muscle strength 5/5 to all lower extremity muscle groups bilaterally. No pain crepitus or joint limitation noted with ROM b/l. Hallux valgus with bunion deformity noted b/l. Hammertoes noted to the 2-5 bilaterally.  Neurological Examination: Protective sensation intact 5/5 intact bilaterally with 10g monofilament b/l. Vibratory sensation diminished b/l. Proprioception intact bilaterally. Clonus negative b/l.  Assessment: 1. Pain due to onychomycosis of toenails of both feet   2. Diabetic peripheral neuropathy associated with type 2 diabetes mellitus (HCC)    Plan: -Examined patient. -No new findings. No new orders. -Continue diabetic foot care principles.Wife is very involved in daily foot checks for any new developments on his feet. He has a tube of Mupirocin Ointment that she applies if he has any abrasions or superficial injuries.She makes sure he schedules an appointment if any new developments arise. -Toenails 2-5 bilaterally debrided in length and girth without iatrogenic bleeding with sterile nail nipper and dremel.  -Patient to report any pedal injuries to medical professional immediately. -Patient to continue soft, supportive shoe gear daily. -Patient/POA to call should there be question/concern in the interim.  Jennifer L Galaway, DPM 

## 2020-10-17 DIAGNOSIS — R609 Edema, unspecified: Secondary | ICD-10-CM | POA: Diagnosis not present

## 2020-10-17 DIAGNOSIS — D631 Anemia in chronic kidney disease: Secondary | ICD-10-CM | POA: Diagnosis not present

## 2020-10-17 DIAGNOSIS — N185 Chronic kidney disease, stage 5: Secondary | ICD-10-CM | POA: Diagnosis not present

## 2020-10-17 DIAGNOSIS — E1122 Type 2 diabetes mellitus with diabetic chronic kidney disease: Secondary | ICD-10-CM | POA: Diagnosis not present

## 2020-10-17 DIAGNOSIS — I120 Hypertensive chronic kidney disease with stage 5 chronic kidney disease or end stage renal disease: Secondary | ICD-10-CM | POA: Diagnosis not present

## 2020-10-26 ENCOUNTER — Encounter (HOSPITAL_COMMUNITY): Payer: Medicare Other

## 2020-11-01 DIAGNOSIS — I129 Hypertensive chronic kidney disease with stage 1 through stage 4 chronic kidney disease, or unspecified chronic kidney disease: Secondary | ICD-10-CM | POA: Diagnosis not present

## 2020-11-01 DIAGNOSIS — G4733 Obstructive sleep apnea (adult) (pediatric): Secondary | ICD-10-CM | POA: Diagnosis not present

## 2020-11-01 DIAGNOSIS — E039 Hypothyroidism, unspecified: Secondary | ICD-10-CM | POA: Diagnosis not present

## 2020-11-01 DIAGNOSIS — I10 Essential (primary) hypertension: Secondary | ICD-10-CM | POA: Diagnosis not present

## 2020-11-01 DIAGNOSIS — E1129 Type 2 diabetes mellitus with other diabetic kidney complication: Secondary | ICD-10-CM | POA: Diagnosis not present

## 2020-11-01 DIAGNOSIS — N184 Chronic kidney disease, stage 4 (severe): Secondary | ICD-10-CM | POA: Diagnosis not present

## 2020-11-01 DIAGNOSIS — M6281 Muscle weakness (generalized): Secondary | ICD-10-CM | POA: Diagnosis not present

## 2020-11-01 DIAGNOSIS — I480 Paroxysmal atrial fibrillation: Secondary | ICD-10-CM | POA: Diagnosis not present

## 2020-11-01 DIAGNOSIS — Z794 Long term (current) use of insulin: Secondary | ICD-10-CM | POA: Diagnosis not present

## 2020-11-11 ENCOUNTER — Encounter (HOSPITAL_COMMUNITY): Payer: Self-pay | Admitting: Vascular Surgery

## 2020-11-11 ENCOUNTER — Other Ambulatory Visit (HOSPITAL_COMMUNITY)
Admission: RE | Admit: 2020-11-11 | Discharge: 2020-11-11 | Disposition: A | Discharge status: Home / Self Care | Payer: Medicare Other | Source: Ambulatory Visit | Attending: Vascular Surgery | Admitting: Vascular Surgery

## 2020-11-11 DIAGNOSIS — Z20822 Contact with and (suspected) exposure to covid-19: Secondary | ICD-10-CM | POA: Insufficient documentation

## 2020-11-11 DIAGNOSIS — Z01812 Encounter for preprocedural laboratory examination: Secondary | ICD-10-CM | POA: Insufficient documentation

## 2020-11-11 LAB — SARS CORONAVIRUS 2 (TAT 6-24 HRS): SARS Coronavirus 2: NEGATIVE

## 2020-11-14 ENCOUNTER — Encounter (HOSPITAL_COMMUNITY): Payer: Self-pay | Admitting: Vascular Surgery

## 2020-11-14 ENCOUNTER — Ambulatory Visit (HOSPITAL_COMMUNITY)
Admission: RE | Admit: 2020-11-14 | Discharge: 2020-11-14 | Disposition: A | Discharge status: Home / Self Care | Payer: Medicare Other | Attending: Vascular Surgery | Admitting: Vascular Surgery

## 2020-11-14 ENCOUNTER — Encounter (HOSPITAL_COMMUNITY)
Admission: RE | Disposition: A | Discharge status: Home / Self Care | Payer: Self-pay | Source: Home / Self Care | Attending: Vascular Surgery

## 2020-11-14 ENCOUNTER — Ambulatory Visit (HOSPITAL_COMMUNITY): Payer: Medicare Other | Admitting: Certified Registered Nurse Anesthetist

## 2020-11-14 ENCOUNTER — Other Ambulatory Visit: Payer: Self-pay

## 2020-11-14 DIAGNOSIS — Z809 Family history of malignant neoplasm, unspecified: Secondary | ICD-10-CM | POA: Diagnosis not present

## 2020-11-14 DIAGNOSIS — Z91040 Latex allergy status: Secondary | ICD-10-CM | POA: Diagnosis not present

## 2020-11-14 DIAGNOSIS — Z794 Long term (current) use of insulin: Secondary | ICD-10-CM | POA: Insufficient documentation

## 2020-11-14 DIAGNOSIS — Z86718 Personal history of other venous thrombosis and embolism: Secondary | ICD-10-CM | POA: Insufficient documentation

## 2020-11-14 DIAGNOSIS — E1142 Type 2 diabetes mellitus with diabetic polyneuropathy: Secondary | ICD-10-CM | POA: Diagnosis not present

## 2020-11-14 DIAGNOSIS — Z87891 Personal history of nicotine dependence: Secondary | ICD-10-CM | POA: Insufficient documentation

## 2020-11-14 DIAGNOSIS — Z888 Allergy status to other drugs, medicaments and biological substances status: Secondary | ICD-10-CM | POA: Insufficient documentation

## 2020-11-14 DIAGNOSIS — X58XXXA Exposure to other specified factors, initial encounter: Secondary | ICD-10-CM | POA: Diagnosis not present

## 2020-11-14 DIAGNOSIS — T82858A Stenosis of vascular prosthetic devices, implants and grafts, initial encounter: Secondary | ICD-10-CM | POA: Diagnosis not present

## 2020-11-14 DIAGNOSIS — N185 Chronic kidney disease, stage 5: Secondary | ICD-10-CM | POA: Diagnosis not present

## 2020-11-14 DIAGNOSIS — I120 Hypertensive chronic kidney disease with stage 5 chronic kidney disease or end stage renal disease: Secondary | ICD-10-CM | POA: Diagnosis not present

## 2020-11-14 DIAGNOSIS — N184 Chronic kidney disease, stage 4 (severe): Secondary | ICD-10-CM

## 2020-11-14 DIAGNOSIS — N186 End stage renal disease: Secondary | ICD-10-CM | POA: Diagnosis not present

## 2020-11-14 DIAGNOSIS — T82868A Thrombosis of vascular prosthetic devices, implants and grafts, initial encounter: Secondary | ICD-10-CM | POA: Insufficient documentation

## 2020-11-14 DIAGNOSIS — Z79899 Other long term (current) drug therapy: Secondary | ICD-10-CM | POA: Insufficient documentation

## 2020-11-14 DIAGNOSIS — E1122 Type 2 diabetes mellitus with diabetic chronic kidney disease: Secondary | ICD-10-CM | POA: Insufficient documentation

## 2020-11-14 DIAGNOSIS — Z86711 Personal history of pulmonary embolism: Secondary | ICD-10-CM | POA: Insufficient documentation

## 2020-11-14 DIAGNOSIS — G4733 Obstructive sleep apnea (adult) (pediatric): Secondary | ICD-10-CM | POA: Diagnosis not present

## 2020-11-14 DIAGNOSIS — Z9989 Dependence on other enabling machines and devices: Secondary | ICD-10-CM | POA: Diagnosis not present

## 2020-11-14 HISTORY — PX: BASCILIC VEIN TRANSPOSITION: SHX5742

## 2020-11-14 LAB — GLUCOSE, CAPILLARY
Glucose-Capillary: 106 mg/dL — ABNORMAL HIGH (ref 70–99)
Glucose-Capillary: 85 mg/dL (ref 70–99)
Glucose-Capillary: 101 mg/dL — ABNORMAL HIGH (ref 70–99)

## 2020-11-14 LAB — POCT I-STAT, CHEM 8
BUN: 87 mg/dL — ABNORMAL HIGH (ref 8–23)
Calcium, Ion: 1.11 mmol/L — ABNORMAL LOW (ref 1.15–1.40)
Chloride: 101 mmol/L (ref 98–111)
Creatinine, Ser: 3.5 mg/dL — ABNORMAL HIGH (ref 0.61–1.24)
Glucose, Bld: 104 mg/dL — ABNORMAL HIGH (ref 70–99)
HCT: 39 % (ref 39.0–52.0)
Hemoglobin: 13.3 g/dL (ref 13.0–17.0)
Potassium: 4.5 mmol/L (ref 3.5–5.1)
Sodium: 143 mmol/L (ref 135–145)
TCO2: 33 mmol/L — ABNORMAL HIGH (ref 22–32)

## 2020-11-14 SURGERY — TRANSPOSITION, VEIN, BASILIC
Anesthesia: Monitor Anesthesia Care | Site: Arm Upper | Laterality: Right

## 2020-11-14 MED ORDER — LIDOCAINE HCL (PF) 1 % IJ SOLN
INTRAMUSCULAR | Status: AC
  Filled 2020-11-14: qty 30

## 2020-11-14 MED ORDER — ACETAMINOPHEN 160 MG/5ML PO SOLN: 1000.0000 mg | Freq: Once | ORAL | Status: DC | PRN

## 2020-11-14 MED ORDER — OXYCODONE HCL 5 MG PO TABS: 5.0000 mg | ORAL_TABLET | Freq: Once | ORAL | Status: DC | PRN

## 2020-11-14 MED ORDER — ACETAMINOPHEN 10 MG/ML IV SOLN: 1000.0000 mg | Freq: Once | INTRAVENOUS | Status: DC | PRN

## 2020-11-14 MED ORDER — ONDANSETRON HCL 4 MG/2ML IJ SOLN
INTRAMUSCULAR | Status: DC | PRN
  Administered 2020-11-14: 4 mg via INTRAVENOUS

## 2020-11-14 MED ORDER — ONDANSETRON HCL 4 MG/2ML IJ SOLN
INTRAMUSCULAR | Status: AC
  Filled 2020-11-14: qty 2

## 2020-11-14 MED ORDER — HEPARIN SODIUM (PORCINE) 1000 UNIT/ML IJ SOLN
INTRAMUSCULAR | Status: AC
  Filled 2020-11-14: qty 1

## 2020-11-14 MED ORDER — PROTAMINE SULFATE 10 MG/ML IV SOLN
INTRAVENOUS | Status: AC
  Filled 2020-11-14: qty 5

## 2020-11-14 MED ORDER — CHLORHEXIDINE GLUCONATE 0.12 % MT SOLN
15.0000 mL | Freq: Once | OROMUCOSAL | Status: AC
  Administered 2020-11-14: 15 mL via OROMUCOSAL
  Filled 2020-11-14: qty 15

## 2020-11-14 MED ORDER — 0.9 % SODIUM CHLORIDE (POUR BTL) OPTIME
TOPICAL | Status: DC | PRN
  Administered 2020-11-14: 1000 mL

## 2020-11-14 MED ORDER — PROPOFOL 500 MG/50ML IV EMUL
INTRAVENOUS | Status: DC | PRN
  Administered 2020-11-14: 100 ug/kg/min via INTRAVENOUS

## 2020-11-14 MED ORDER — CHLORHEXIDINE GLUCONATE 4 % EX LIQD: 60.0000 mL | Freq: Once | CUTANEOUS | Status: DC

## 2020-11-14 MED ORDER — CEFAZOLIN SODIUM-DEXTROSE 2-3 GM-%(50ML) IV SOLR
INTRAVENOUS | Status: DC | PRN
  Administered 2020-11-14: 2 g via INTRAVENOUS

## 2020-11-14 MED ORDER — SODIUM CHLORIDE 0.9 % IV SOLN
INTRAVENOUS | Status: DC | PRN
  Administered 2020-11-14: 500 mL

## 2020-11-14 MED ORDER — PROPOFOL 1000 MG/100ML IV EMUL
INTRAVENOUS | Status: AC
  Filled 2020-11-14: qty 100

## 2020-11-14 MED ORDER — SODIUM CHLORIDE 0.9 % IV SOLN: INTRAVENOUS | Status: DC

## 2020-11-14 MED ORDER — LACTATED RINGERS IV SOLN: INTRAVENOUS | Status: DC

## 2020-11-14 MED ORDER — PROTAMINE SULFATE 10 MG/ML IV SOLN
INTRAVENOUS | Status: DC | PRN
  Administered 2020-11-14: 50 mg via INTRAVENOUS

## 2020-11-14 MED ORDER — LIDOCAINE-EPINEPHRINE (PF) 1 %-1:200000 IJ SOLN
INTRAMUSCULAR | Status: AC
  Filled 2020-11-14: qty 30

## 2020-11-14 MED ORDER — FENTANYL CITRATE (PF) 250 MCG/5ML IJ SOLN
INTRAMUSCULAR | Status: DC | PRN
  Administered 2020-11-14 (×2): 25 ug via INTRAVENOUS

## 2020-11-14 MED ORDER — FENTANYL CITRATE (PF) 250 MCG/5ML IJ SOLN
INTRAMUSCULAR | Status: AC
  Filled 2020-11-14: qty 5

## 2020-11-14 MED ORDER — OXYCODONE HCL 5 MG/5ML PO SOLN: 5.0000 mg | Freq: Once | ORAL | Status: DC | PRN

## 2020-11-14 MED ORDER — FENTANYL CITRATE (PF) 100 MCG/2ML IJ SOLN: 25.0000 ug | INTRAMUSCULAR | Status: DC | PRN

## 2020-11-14 MED ORDER — HEPARIN SODIUM (PORCINE) 1000 UNIT/ML IJ SOLN
INTRAMUSCULAR | Status: DC | PRN
  Administered 2020-11-14: 10000 [IU] via INTRAVENOUS

## 2020-11-14 MED ORDER — LIDOCAINE-EPINEPHRINE (PF) 1 %-1:200000 IJ SOLN
INTRAMUSCULAR | Status: DC | PRN
  Administered 2020-11-14: 11 mL

## 2020-11-14 MED ORDER — CEFAZOLIN SODIUM 1 G IJ SOLR
INTRAMUSCULAR | Status: AC
  Filled 2020-11-14: qty 20

## 2020-11-14 MED ORDER — SODIUM CHLORIDE 0.9 % IV SOLN
INTRAVENOUS | Status: AC
  Filled 2020-11-14: qty 1.2

## 2020-11-14 MED ORDER — ORAL CARE MOUTH RINSE: 15.0000 mL | Freq: Once | OROMUCOSAL | Status: AC

## 2020-11-14 MED ORDER — ACETAMINOPHEN 500 MG PO TABS: 1000.0000 mg | ORAL_TABLET | Freq: Once | ORAL | Status: DC | PRN

## 2020-11-14 SURGICAL SUPPLY — 34 items
ARMBAND PINK RESTRICT EXTREMIT (MISCELLANEOUS) ×2 IMPLANT
CANISTER SUCT 3000ML PPV (MISCELLANEOUS) ×2 IMPLANT
CANNULA VESSEL 3MM 2 BLNT TIP (CANNULA) ×2 IMPLANT
CLIP VESOCCLUDE MED 24/CT (CLIP) ×2 IMPLANT
CLIP VESOCCLUDE SM WIDE 24/CT (CLIP) ×2 IMPLANT
COVER PROBE W GEL 5X96 (DRAPES) IMPLANT
COVER WAND RF STERILE (DRAPES) ×2 IMPLANT
DECANTER SPIKE VIAL GLASS SM (MISCELLANEOUS) ×2 IMPLANT
DERMABOND ADVANCED (GAUZE/BANDAGES/DRESSINGS) ×1
DERMABOND ADVANCED .7 DNX12 (GAUZE/BANDAGES/DRESSINGS) ×1 IMPLANT
ELECT REM PT RETURN 9FT ADLT (ELECTROSURGICAL) ×2
ELECTRODE REM PT RTRN 9FT ADLT (ELECTROSURGICAL) ×1 IMPLANT
GLOVE BIO SURGEON STRL SZ7.5 (GLOVE) ×2 IMPLANT
GLOVE SRG 8 PF TXTR STRL LF DI (GLOVE) ×1 IMPLANT
GLOVE SURG UNDER POLY LF SZ6.5 (GLOVE) ×4 IMPLANT
GLOVE SURG UNDER POLY LF SZ7 (GLOVE) ×2 IMPLANT
GLOVE SURG UNDER POLY LF SZ8 (GLOVE) ×1
GOWN STRL REUS W/ TWL LRG LVL3 (GOWN DISPOSABLE) ×3 IMPLANT
GOWN STRL REUS W/TWL LRG LVL3 (GOWN DISPOSABLE) ×3
KIT BASIN OR (CUSTOM PROCEDURE TRAY) ×2 IMPLANT
KIT TURNOVER KIT B (KITS) ×2 IMPLANT
NS IRRIG 1000ML POUR BTL (IV SOLUTION) ×2 IMPLANT
PACK CV ACCESS (CUSTOM PROCEDURE TRAY) ×2 IMPLANT
PAD ARMBOARD 7.5X6 YLW CONV (MISCELLANEOUS) ×4 IMPLANT
SPONGE SURGIFOAM ABS GEL 100 (HEMOSTASIS) IMPLANT
SUT PROLENE 6 0 BV (SUTURE) ×4 IMPLANT
SUT SILK 2 0 SH (SUTURE) IMPLANT
SUT VIC AB 3-0 SH 27 (SUTURE) ×2
SUT VIC AB 3-0 SH 27X BRD (SUTURE) ×2 IMPLANT
SUT VICRYL 4-0 PS2 18IN ABS (SUTURE) ×4 IMPLANT
SYR CONTROL 10ML LL (SYRINGE) ×2 IMPLANT
TOWEL GREEN STERILE (TOWEL DISPOSABLE) ×2 IMPLANT
UNDERPAD 30X36 HEAVY ABSORB (UNDERPADS AND DIAPERS) ×2 IMPLANT
WATER STERILE IRR 1000ML POUR (IV SOLUTION) ×2 IMPLANT

## 2020-11-14 NOTE — H&P (Signed)
ASSESSMENT & PLAN:   STAGE V CHRONIC KIDNEY DISEASE: I do not think the patient would be a good candidate for first rib resection and subsequent intervention on the left subclavian vein occlusion and cephalic vein stenosis.  Therefore I would recommend placement of new access in the right arm.  We have been asked to not place an AV graft and only place an AV fistula if possible.  Based on his vein map his only option for a fistula on the right would be a basilic vein transposition.  I explained that we would do this in 2 stages.  I have discussed the indications of the procedure and the potential complications and he is agreeable to proceed.  This has been scheduled for Tuesday, 09/20/2020.  Daniel Mayo, MD Office: [phone]   HPI:   Daniel Ochoa is a pleasant 80 y.o. male, who presents for evaluation for new access.  I created a left brachiocephalic fistula on this patient in August 2018.  He underwent a fistulogram reportedly by Dr. Augustin Coupe which showed 100% subclavian stenosis on the left and also a cephalic vein stenosis.  It was felt that he would either need a first rib resection versus new access in the right arm.  According to the records from the referring office we will place an AV fistula but if this were not possible wait before placing an AV graft.  According to the records he has also undergone previous angioplasty of a left subclavian vein stenosis with up to a 12 mm balloon.  He is also had previous venoplasty of the cephalic arch stenosis.  The patient is not on dialysis.  He denies any significant pain or paresthesias in his upper extremities.  He denies any recent uremic symptoms.  Specifically, he denies nausea, vomiting, fatigue, anorexia, or palpitations.      Past Medical History:  Diagnosis Date  . Acute deep vein thrombosis (DVT) of tibial vein of right lower extremity ([place]) 04/13/2014  . Acute pulmonary embolism ([place]) 04/15/2014  . Arthritis   .  Chronic fatigue   . Chronic kidney disease, stage IV (severe) ([place])   . Complication of anesthesia    one surgery took 4 people to hold him down 2000  . Constipation   . Diabetic peripheral neuropathy ([place]) 10/26/2015  . DM (diabetes mellitus), type 2, uncontrolled, with renal complications ([place])    [facility]  . DVT (deep venous thrombosis) ([place])    RLE DVT with intermediate risk VQ scan for PE 03/2014  . Dyspnea   . Essential hypertension   . Fibromyalgia   . Hypothyroidism   . Hypoxia 04/13/2014  . Neuropathy   . Obesity   . OSA on CPAP   . Pulmonary embolism ([place]) 04/13/2014   VQ 04/13/14 intermediate assoc with R DVT > on coumadin since Echo 04/14/14 > PA peak pressure: 89 mm Hg (S).          Family History  Problem Relation Age of Onset  . Cancer Father     SOCIAL HISTORY: Social History        Socioeconomic History  . Marital status: Married    Spouse name: Not on file  . Number of children: 1  . Years of education: Not on file  . Highest education level: Not on file  Occupational History  . Not on file  Tobacco Use  . Smoking status: Former Smoker    Years: 20.00    Types: Pipe  . Smokeless tobacco:  Never Used  . Tobacco comment: quit in 1993  Vaping Use  . Vaping Use: Never used  Substance and Sexual Activity  . Alcohol use: No  . Drug use: No  . Sexual activity: Not on file  Other Topics Concern  . Not on file  Social History Narrative  . Not on file   Social Determinants of Health   Financial Resource Strain: Not on file  Food Insecurity: Not on file  Transportation Needs: Not on file  Physical Activity: Not on file  Stress: Not on file  Social Connections: Not on file  Intimate Partner Violence: Not on file         Allergies  Allergen Reactions  . Ace Inhibitors Shortness Of Breath    choking  . Statins Other (See Comments)    Elevated CK level  . Latex Rash    Severe blistery rash  . Sudafed  [Pseudoephedrine Hcl] Rash          Current Outpatient Medications  Medication Sig Dispense Refill  . Accu-Chek Softclix Lancets lancets USE TO CHECK BLOOD SUGAR UP TO TID E11.29    . amLODipine (NORVASC) 5 MG tablet Take 5 mg by mouth daily.     . Ascorbic Acid (VITAMIN C) 1000 MG tablet Take 1,000 mg by mouth daily.    Marland Kitchen atenolol (TENORMIN) 50 MG tablet Take 50 mg by mouth 2 (two) times daily.     . Calcium Carb-Cholecalciferol (CALCIUM 600 + D PO) Take 1 tablet by mouth 2 (two) times daily.    . clonazePAM (KLONOPIN) 1 MG tablet Take 1 mg by mouth at bedtime.     . DYMISTA 137-50 MCG/ACT SUSP Place 1 spray into both nostrils 2 (two) times daily.     . famotidine (PEPCID) 40 MG tablet Take 40 mg by mouth at bedtime.     Marland Kitchen FREESTYLE TEST STRIPS test strip USE AS DIRECTED TO TEST BLOOD SUGAR 5 TIMES PER DAY. DX: E11.65  11  . furosemide (LASIX) 40 MG tablet Take 40 mg by mouth 2 (two) times daily.     Marland Kitchen HUMALOG 100 UNIT/ML injection USE IN OMNIPOD DAILY (AVERAGE OF 110 UNITS/DAY) **E11.29  2  . HYDROcodone-acetaminophen (NORCO/VICODIN) 5-325 MG tablet TAKE 1 TABLET BY MOUTH 4 TIMES DAILY AS NEEDED FOR MODERATE PAIN.  0  . Lancets Misc. (ACCU-CHEK FASTCLIX LANCET) KIT USE TO CHECK BLOOD SUGAR UP TO TID E11.29    . nystatin (MYCOSTATIN) 500000 units TABS tablet Take 1 tablet by mouth daily as needed. Uses for heat rash    . SYNTHROID 200 MCG tablet Take 200 mcg by mouth See admin instructions. Takes the 200 mcg daily except on Sundays, pt takes 300 mcg (or 1.5 tablets)     No current facility-administered medications for this visit.    REVIEW OF SYSTEMS:  '[X]'  denotes positive finding, '[ ]'  denotes negative finding Cardiac  Comments:  Chest pain or chest pressure:    Shortness of breath upon exertion:    Short of breath when lying flat:    Irregular heart rhythm:        Vascular    Pain in calf, thigh, or hip brought on by ambulation:     Pain in feet at night that wakes you up from your sleep:     Blood clot in your veins:    Leg swelling:         Pulmonary    Oxygen at home:    Productive cough:  Wheezing:         Neurologic    Sudden weakness in arms or legs:     Sudden numbness in arms or legs:     Sudden onset of difficulty speaking or slurred speech:    Temporary loss of vision in one eye:     Problems with dizziness:         Gastrointestinal    Blood in stool:     Vomited blood:         Genitourinary    Burning when urinating:     Blood in urine:        Psychiatric    Major depression:         Hematologic    Bleeding problems:    Problems with blood clotting too easily:        Skin    Rashes or ulcers:        Constitutional    Fever or chills:     PHYSICAL EXAM:      Vitals:   08/31/20 1403  BP: (!) 158/64  Pulse: (!) 59  Resp: 20  Temp: 98.3 F (36.8 C)  SpO2: 96%  Weight: 278 lb (126.1 kg)  Height: '6\' 1"'  (1.854 m)    GENERAL: The patient is a well-nourished male, in no acute distress. The vital signs are documented above. CARDIAC: There is a regular rate and rhythm.  VASCULAR: I do not detect carotid bruits. He has palpable radial pulses bilaterally. He has significant collaterals in his left shoulder consistent with a subclavian venous occlusion. PULMONARY: There is good air exchange bilaterally without wheezing or rales. ABDOMEN: Soft and non-tender with normal pitched bowel sounds.  MUSCULOSKELETAL: There are no major deformities or cyanosis. NEUROLOGIC: No focal weakness or paresthesias are detected. SKIN: There are no ulcers or rashes noted. PSYCHIATRIC: The patient has a normal affect.  DATA:    UPPER EXTREMITY VEIN MAP: I have independently interpreted his upper extremity vein map.  On the right side the forearm cephalic vein is small.  The upper arm cephalic vein has  thrombosed.  The basilic vein on the right looks reasonable in size.  UPPER EXTREMITY ARTERIAL DUPLEX: I have independently interpreted his upper extremity arterial duplex scan.  On the right side the brachial artery measures 0.62 cm in diameter.  There is a triphasic radial signal and a triphasic ulnar signal  Daniel Mayo, MD Office: [phone]

## 2020-11-14 NOTE — Transfer of Care (Signed)
Immediate Anesthesia Transfer of Care Note  Patient: Daniel Ochoa  Procedure(s) Performed: [facility] 1ST STAGE RIGHT (Right Arm Upper)  Patient Location: PACU  Anesthesia Type:MAC  Level of Consciousness: awake, alert  and oriented  Airway & Oxygen Therapy: Patient Spontanous Breathing  Post-op Assessment: Report given to RN and Post -op Vital signs reviewed and stable  Post vital signs: Reviewed and stable  Last Vitals:  Vitals Value Taken Time  BP 159/68 11/14/20 1201  Temp    Pulse 57 11/14/20 1202  Resp 11 11/14/20 1202  SpO2 98 % 11/14/20 1202  Vitals shown include unvalidated device data.  Last Pain:  Vitals:   11/14/20 0823  TempSrc:   PainSc: 0-No pain         Complications: No complications documented.

## 2020-11-14 NOTE — Discharge Instructions (Signed)
   [facility]  Discharge Instructions  AV Fistula or Graft Surgery for Dialysis Access  Please refer to the following instructions for your post-procedure care. Your surgeon or physician assistant will discuss any changes with you.  Activity  You may drive the day following your surgery, if you are comfortable and no longer taking prescription pain medication. Resume full activity as the soreness in your incision resolves.  Bathing/Showering  You may shower after you go home. Keep your incision dry for 48 hours. Do not soak in a bathtub, hot tub, or swim until the incision heals completely. You may not shower if you have a hemodialysis catheter.  Incision Care  Clean your incision with mild soap and water after 48 hours. Pat the area dry with a clean towel. You do not need a bandage unless otherwise instructed. Do not apply any ointments or creams to your incision. You may have skin glue on your incision. Do not peel it off. It will come off on its own in about one week. Your arm may swell a bit after surgery. To reduce swelling use pillows to elevate your arm so it is above your heart. Your doctor will tell you if you need to lightly wrap your arm with an ACE bandage.  Diet  Resume your normal diet. There are not special food restrictions following this procedure. In order to heal from your surgery, it is CRITICAL to get adequate nutrition. Your body requires vitamins, minerals, and protein. Vegetables are the best source of vitamins and minerals. Vegetables also provide the perfect balance of protein. Processed food has little nutritional value, so try to avoid this.  Medications  Resume taking all of your medications. If your incision is causing pain, you may take over-the counter pain relievers such as acetaminophen (Tylenol). If you were prescribed a stronger pain medication, please be aware these medications can cause nausea and constipation. Prevent  nausea by taking the medication with a snack or meal. Avoid constipation by drinking plenty of fluids and eating foods with high amount of fiber, such as fruits, vegetables, and grains.  Do not take Tylenol if you are taking prescription pain medications.  Follow up Your surgeon may want to see you in the office following your access surgery. If so, this will be arranged at the time of your surgery.  Please call us immediately for any of the following conditions:  . Increased pain, redness, drainage (pus) from your incision site . Fever of 101 degrees or higher . Severe or worsening pain at your incision site . Hand pain or numbness. .  Reduce your risk of vascular disease:  . Stop smoking. If you would like help, call [facility] at 1-800-QUIT-NOW 365-043-5407) or [place] at (915) 468-8202  . Manage your cholesterol . Maintain a desired weight . Control your diabetes . Keep your blood pressure down  Dialysis  It will take several weeks to several months for your new dialysis access to be ready for use. Your surgeon will determine when it is okay to use it. Your nephrologist will continue to direct your dialysis. You can continue to use your Permcath until your new access is ready for use.   11/14/2020 Daniel Ochoa [id number] Dec 19, 1940  Surgeon(s): Angelia Mould, MD  Procedure(s): BASCILIC VEIN TRANSPOSITION 1ST STAGE RIGHT  x Do not stick fistula for 12 weeks    If you have any questions, please call the office at [phone].

## 2020-11-14 NOTE — Anesthesia Procedure Notes (Signed)
Procedure Name: MAC Date/Time: 11/14/2020 10:45 AM Performed by: Dorthea Cove, CRNA Pre-anesthesia Checklist: Patient identified, Emergency Drugs available, Suction available, Patient being monitored and Timeout performed Patient Re-evaluated:Patient Re-evaluated prior to induction Oxygen Delivery Method: Simple face mask Preoxygenation: Pre-oxygenation with 100% oxygen Induction Type: IV induction Placement Confirmation: positive ETCO2 and CO2 detector Dental Injury: Teeth and Oropharynx as per pre-operative assessment

## 2020-11-14 NOTE — Op Note (Signed)
    NAME: Daniel Ochoa    MRN: [id number] DOB: [birth year]    DATE OF OPERATION: 11/14/2020  PREOP DIAGNOSIS:    Stage V chronic kidney disease  POSTOP DIAGNOSIS:    Same  PROCEDURE:    Right first stage basilic vein transposition  SURGEON: Judeth Cornfield. Scot Dock, MD  ASSIST: Liana Crocker, PA  ANESTHESIA: General  EBL: Minimal  INDICATIONS:    Daniel Ochoa is a 80 y.o. male with stage V chronic kidney disease.  We were asked to place a fistula but not a graft if a fistula was not possible.  The upper arm cephalic vein was thrombosed.  The only option for a fistula was a basilic vein transposition which would have to be done in 2 stages.  FINDINGS:   4 mm basilic vein.  TECHNIQUE:   The patient was taken to the operating room and sedated by anesthesia.  The right arm was prepped and draped in the usual sterile fashion.  I looked at the basilic vein with the SonoSite and mapped this out.  An incision was made over the vein just above the antecubital level.  The vein was dissected free with branches divided between 3-0 silk ties.  Through the same incision I dissected out the brachial artery.  Patient was heparinized.  The vein was ligated distally.  The brachial artery was clamped proximally and distally and a longitudinal arteriotomy was made.  The vein was spatulated and sewn end-to-side to the artery leaving some slight redundancy in the vein for future second stage.  At the completion there was an excellent thrill in the fistula.  There was a palpable radial pulse.  Hemostasis was obtained in the wound and the heparin was partially reversed with protamine.  The wound was closed with a deep layer of 3-0 Vicryl and the skin closed with 4-0 Vicryl.  Dermabond was applied.  The patient tolerated the procedure well was transferred to the recovery room in stable condition.  All needle and sponge counts were correct.  Given the complexity of the case a first assistant was  necessary in order to expedient the procedure and safely perform the technical aspects of the operation.  [name], MD, FACS [facility]  DATE OF DICTATION:   11/14/2020

## 2020-11-14 NOTE — Anesthesia Preprocedure Evaluation (Signed)
Anesthesia Evaluation  Patient identified by MRN, date of birth, ID band Patient awake    Reviewed: Allergy & Precautions, NPO status , Patient's Chart, lab work & pertinent test results  History of Anesthesia Complications Negative for: history of anesthetic complications  Airway Mallampati: III  TM Distance: >3 FB Neck ROM: Full    Dental  (+) Teeth Intact, Dental Advisory Given,    Pulmonary shortness of breath, sleep apnea , neg COPD, former smoker, neg PE Covid-19 Nucleic Acid Test Results Lab Results      Component                Value               Date                      SARSCOV2NAA              NEGATIVE            11/11/2020                [place]              NEGATIVE            05/16/2019                [place]              Not Detected        05/15/2019              breath sounds clear to auscultation       Cardiovascular hypertension, Pt. on medications and Pt. on home beta blockers (-) angina(-) Past MI and (-) CHF  Rhythm:Regular     Neuro/Psych neg Seizures  Neuromuscular disease    GI/Hepatic negative GI ROS, Neg liver ROS, No results found for: ALT, AST, GGT, ALKPHOS, BILITOT    Endo/Other  diabetesHypothyroidism   Renal/GU Renal InsufficiencyRenal diseaseLab Results      Component                Value               Date                      CREATININE               3.50 (H)            11/14/2020                Musculoskeletal  (+) Arthritis , Fibromyalgia -  Abdominal   Peds  Hematology Lab Results      Component                Value               Date                      WBC                      14.8 (H)            04/14/2014                HGB                      13.3  11/14/2020                HCT                      39.0                11/14/2020                MCV                      88.4                04/14/2014                PLT                      182                  04/14/2014              Anesthesia Other Findings   Reproductive/Obstetrics                             Anesthesia Physical Anesthesia Plan  ASA: IV  Anesthesia Plan: MAC   Post-op Pain Management:    Induction: Intravenous  PONV Risk Score and Plan: 1 and Ondansetron and Propofol infusion  Airway Management Planned: Nasal Cannula  Additional Equipment: None  Intra-op Plan:   Post-operative Plan:   Informed Consent: I have reviewed the patients History and Physical, chart, labs and discussed the procedure including the risks, benefits and alternatives for the proposed anesthesia with the patient or authorized representative who has indicated his/her understanding and acceptance.     Dental advisory given  Plan Discussed with: CRNA and Surgeon  Anesthesia Plan Comments:         Anesthesia Quick Evaluation

## 2020-11-15 ENCOUNTER — Encounter (HOSPITAL_COMMUNITY): Payer: Self-pay | Admitting: Vascular Surgery

## 2020-11-15 NOTE — Anesthesia Postprocedure Evaluation (Signed)
Anesthesia Post Note  Patient: Anvay Tennis  Procedure(s) Performed: [place] 1ST STAGE RIGHT (Right Arm Upper)     Patient location during evaluation: PACU Anesthesia Type: MAC Level of consciousness: awake and alert Pain management: pain level controlled Vital Signs Assessment: post-procedure vital signs reviewed and stable Respiratory status: spontaneous breathing, nonlabored ventilation, respiratory function stable and patient connected to nasal cannula oxygen Cardiovascular status: stable and blood pressure returned to baseline Postop Assessment: no apparent nausea or vomiting Anesthetic complications: no   No complications documented.  Last Vitals:  Vitals:   11/14/20 1215 11/14/20 1232  BP: (!) 155/74 (!) 162/67  Pulse: (!) 58 (!) 58  Resp: 10 11  Temp:  36.6 C  SpO2: 99% 96%    Last Pain:  Vitals:   11/14/20 1232  TempSrc:   PainSc: 0-No pain                 Lenzie Montesano

## 2020-11-23 DIAGNOSIS — N185 Chronic kidney disease, stage 5: Secondary | ICD-10-CM | POA: Diagnosis not present

## 2020-12-07 DIAGNOSIS — M545 Low back pain, unspecified: Secondary | ICD-10-CM | POA: Diagnosis not present

## 2020-12-13 DIAGNOSIS — M5416 Radiculopathy, lumbar region: Secondary | ICD-10-CM | POA: Diagnosis not present

## 2020-12-17 ENCOUNTER — Other Ambulatory Visit: Payer: Self-pay

## 2020-12-17 DIAGNOSIS — M545 Low back pain, unspecified: Secondary | ICD-10-CM | POA: Insufficient documentation

## 2020-12-17 DIAGNOSIS — N184 Chronic kidney disease, stage 4 (severe): Secondary | ICD-10-CM

## 2020-12-22 DIAGNOSIS — M5416 Radiculopathy, lumbar region: Secondary | ICD-10-CM | POA: Diagnosis not present

## 2020-12-28 ENCOUNTER — Other Ambulatory Visit: Payer: Self-pay

## 2020-12-28 ENCOUNTER — Ambulatory Visit (HOSPITAL_COMMUNITY)
Admission: RE | Admit: 2020-12-28 | Discharge: 2020-12-28 | Disposition: A | Discharge status: Home / Self Care | Payer: Medicare Other | Source: Ambulatory Visit | Attending: Vascular Surgery | Admitting: Vascular Surgery

## 2020-12-28 ENCOUNTER — Ambulatory Visit (INDEPENDENT_AMBULATORY_CARE_PROVIDER_SITE_OTHER): Payer: Medicare Other | Admitting: Physician Assistant

## 2020-12-28 VITALS — BP 93/43 | HR 67 | Temp 98.3°F | Resp 20 | Ht 73.0 in | Wt 270.0 lb

## 2020-12-28 DIAGNOSIS — N184 Chronic kidney disease, stage 4 (severe): Secondary | ICD-10-CM

## 2020-12-28 NOTE — Progress Notes (Signed)
POST OPERATIVE OFFICE NOTE    CC:  F/u for surgery  HPI:  This is a 80 y.o. male who is s/p right 1st stage on 11/14/2020 by Dr. Scot Dock for stage V CKD.  Pt returns today for follow up duplex.   He has hx of left BC AVF that Dr. Scot Dock created in 2018 and he underwent fistulogram reportedly by Dr. Augustin Coupe which showed 100% subclavian stenosis on the left and also a cephalic vein stenosis. It was felt that he would either need a first rib resection versus new access in the right arm. According to the records from the referring office we will place an AV fistula but if this were not possible wait before placing an AV graft. According to the records he has also undergone previous angioplasty of a left subclavian vein stenosis with up to a 12 mm balloon. He is also had previous venoplasty of the cephalic arch stenosis.  Pt states he does not have pain/numbness in the right hand.  He still has a little numbness in the left hand.  He is not yet on HD.  He states that he is asymptomatic and his numbers are ok at this point.   His primary nephrologist is Dr. Moshe Cipro.   He and his wife state the left arm is still a little swollen but this has gotten better.  They are wanting to know if it will always be this way.   Allergies  Allergen Reactions  . Ace Inhibitors Shortness Of Breath    choking  . Statins Other (See Comments)    Elevated CK level  . Latex Rash    Severe blistery rash    Current Outpatient Medications  Medication Sig Dispense Refill  . Accu-Chek Softclix Lancets lancets USE TO CHECK BLOOD SUGAR UP TO TID E11.29    . acetaminophen (TYLENOL) 325 MG tablet Take 1 tablet by mouth 3 (three) times daily.    Marland Kitchen amLODipine (NORVASC) 5 MG tablet Take 5 mg by mouth daily.     . Ascorbic Acid (VITAMIN C) 500 MG CAPS Take 500 mg by mouth 2 (two) times daily.    Marland Kitchen atenolol (TENORMIN) 50 MG tablet Take 50 mg by mouth 2 (two) times daily.     . Calcium Carb-Cholecalciferol (CALCIUM 500/D)  500-400 MG-UNIT CHEW Take one two times daily    . Calcium Carb-Cholecalciferol (CALCIUM 600 + D PO) Take 1 tablet by mouth 2 (two) times daily.    . carboxymethylcellulose (REFRESH PLUS) 0.5 % SOLN Place 1 drop into both eyes 3 (three) times daily.    . clonazePAM (KLONOPIN) 1 MG tablet Take 1 mg by mouth at bedtime.     . DYMISTA 137-50 MCG/ACT SUSP Place 1 spray into both nostrils 2 (two) times daily.    . famotidine (PEPCID) 40 MG tablet Take 40 mg by mouth at bedtime.     Marland Kitchen FREESTYLE TEST STRIPS test strip USE AS DIRECTED TO TEST BLOOD SUGAR 5 TIMES PER DAY. DX: E11.65  11  . furosemide (LASIX) 40 MG tablet Take 40 mg by mouth 3 (three) times daily.    Marland Kitchen HUMALOG 100 UNIT/ML injection Inject into the skin 3 (three) times daily with meals. PUMP calculate dosage  2  . HYDROcodone-acetaminophen (NORCO/VICODIN) 5-325 MG tablet Take 1 tablet by mouth in the morning, at noon, and at bedtime.  0  . ipratropium (ATROVENT) 0.03 % nasal spray Place 2 sprays into both nostrils daily as needed for rhinitis.    Marland Kitchen  Lancets Misc. (ACCU-CHEK FASTCLIX LANCET) KIT USE TO CHECK BLOOD SUGAR UP TO TID E11.29    . nystatin (MYCOSTATIN) 500000 units TABS tablet Take 1 tablet by mouth daily.    Marland Kitchen OVER THE COUNTER MEDICATION Apply 1 application topically daily as needed (Hempvana).    . SYNTHROID 200 MCG tablet Take 200 mcg by mouth See admin instructions. Takes the 200 mcg daily except on Sundays, pt takes 300 mcg (or 1.5 tablets)     No current facility-administered medications for this visit.     ROS:  See HPI  Physical Exam:  Today's Vitals   12/28/20 0926  BP: (!) 93/43  Pulse: 67  Resp: 20  Temp: 98.3 F (36.8 C)  TempSrc: Temporal  SpO2: 95%  Weight: 270 lb (122.5 kg)  Height: _0  (1.854 m)   Body mass index is 35.62 kg/m.   Incision:  Healed nicely Extremities:   There is a palpable right radial pulse.  Left radial pulse is faintly palpable Motor and sensory are in tact.   There is a  thrill/bruit present.  The fistula is easily palpable The left brachiocephalic fistula is palpable and is pulsatile.     Dialysis Duplex on 12/28/2020: Diameter:  0.80cm-1.25cm Depth:  0.64cm-2.41cm   Assessment/Plan:  This is a 80 y.o. male who is s/p: right 1st stage on 11/14/2020 by Dr. Scot Dock for stage V CKD.  -the pt does not have evidence of steal. -the fistula has matured nicely and is ready for 2nd stage BVT.  I discussed this with pt and his wife and are ready to proceed.  Discussed with them that he would have 2-3 incisions and risks include but not limited to numbness, bleeding, infection. -discussed with Dr. Scot Dock and he would not ligate left arm fistula at this time until pt has working access.  Discussed this with pt and wife.  They expressed understanding.     Leontine Locket, [facility] [phone]  Clinic MD:  Scot Dock

## 2020-12-28 NOTE — H&P (View-Only) (Signed)
POST OPERATIVE OFFICE NOTE    CC:  F/u for surgery  HPI:  This is a 80 y.o. male who is s/p right 1st stage on 11/14/2020 by Dr. Scot Dock for stage V CKD.  Pt returns today for follow up duplex.   He has hx of left BC AVF that Dr. Scot Dock created in 2018 and he underwent fistulogram reportedly by Dr. Augustin Coupe which showed 100% subclavian stenosis on the left and also a cephalic vein stenosis. It was felt that he would either need a first rib resection versus new access in the right arm. According to the records from the referring office we will place an AV fistula but if this were not possible wait before placing an AV graft. According to the records he has also undergone previous angioplasty of a left subclavian vein stenosis with up to a 12 mm balloon. He is also had previous venoplasty of the cephalic arch stenosis.  Pt states he does not have pain/numbness in the right hand.  He still has a little numbness in the left hand.  He is not yet on HD.  He states that he is asymptomatic and his numbers are ok at this point.   His primary nephrologist is Dr. Moshe Cipro.   He and his wife state the left arm is still a little swollen but this has gotten better.  They are wanting to know if it will always be this way.   Allergies  Allergen Reactions  . Ace Inhibitors Shortness Of Breath    choking  . Statins Other (See Comments)    Elevated CK level  . Latex Rash    Severe blistery rash    Current Outpatient Medications  Medication Sig Dispense Refill  . Accu-Chek Softclix Lancets lancets USE TO CHECK BLOOD SUGAR UP TO TID E11.29    . acetaminophen (TYLENOL) 325 MG tablet Take 1 tablet by mouth 3 (three) times daily.    Marland Kitchen amLODipine (NORVASC) 5 MG tablet Take 5 mg by mouth daily.     . Ascorbic Acid (VITAMIN C) 500 MG CAPS Take 500 mg by mouth 2 (two) times daily.    Marland Kitchen atenolol (TENORMIN) 50 MG tablet Take 50 mg by mouth 2 (two) times daily.     . Calcium Carb-Cholecalciferol (CALCIUM 500/D)  500-400 MG-UNIT CHEW Take one two times daily    . Calcium Carb-Cholecalciferol (CALCIUM 600 + D PO) Take 1 tablet by mouth 2 (two) times daily.    . carboxymethylcellulose (REFRESH PLUS) 0.5 % SOLN Place 1 drop into both eyes 3 (three) times daily.    . clonazePAM (KLONOPIN) 1 MG tablet Take 1 mg by mouth at bedtime.     . DYMISTA 137-50 MCG/ACT SUSP Place 1 spray into both nostrils 2 (two) times daily.    . famotidine (PEPCID) 40 MG tablet Take 40 mg by mouth at bedtime.     Marland Kitchen FREESTYLE TEST STRIPS test strip USE AS DIRECTED TO TEST BLOOD SUGAR 5 TIMES PER DAY. DX: E11.65  11  . furosemide (LASIX) 40 MG tablet Take 40 mg by mouth 3 (three) times daily.    Marland Kitchen HUMALOG 100 UNIT/ML injection Inject into the skin 3 (three) times daily with meals. PUMP calculate dosage  2  . HYDROcodone-acetaminophen (NORCO/VICODIN) 5-325 MG tablet Take 1 tablet by mouth in the morning, at noon, and at bedtime.  0  . ipratropium (ATROVENT) 0.03 % nasal spray Place 2 sprays into both nostrils daily as needed for rhinitis.    Marland Kitchen  Lancets Misc. (ACCU-CHEK FASTCLIX LANCET) KIT USE TO CHECK BLOOD SUGAR UP TO TID E11.29    . nystatin (MYCOSTATIN) 500000 units TABS tablet Take 1 tablet by mouth daily.    Marland Kitchen OVER THE COUNTER MEDICATION Apply 1 application topically daily as needed (Hempvana).    . SYNTHROID 200 MCG tablet Take 200 mcg by mouth See admin instructions. Takes the 200 mcg daily except on Sundays, pt takes 300 mcg (or 1.5 tablets)     No current facility-administered medications for this visit.     ROS:  See HPI  Physical Exam:  Today's Vitals   12/28/20 0926  BP: (!) 93/43  Pulse: 67  Resp: 20  Temp: 98.3 F (36.8 C)  TempSrc: Temporal  SpO2: 95%  Weight: 270 lb (122.5 kg)  Height: _0  (1.854 m)   Body mass index is 35.62 kg/m.   Incision:  Healed nicely Extremities:   There is a palpable right radial pulse.  Left radial pulse is faintly palpable Motor and sensory are in tact.   There is a  thrill/bruit present.  The fistula is easily palpable The left brachiocephalic fistula is palpable and is pulsatile.     Dialysis Duplex on 12/28/2020: Diameter:  0.80cm-1.25cm Depth:  0.64cm-2.41cm   Assessment/Plan:  This is a 80 y.o. male who is s/p: right 1st stage on 11/14/2020 by Dr. Scot Dock for stage V CKD.  -the pt does not have evidence of steal. -the fistula has matured nicely and is ready for 2nd stage BVT.  I discussed this with pt and his wife and are ready to proceed.  Discussed with them that he would have 2-3 incisions and risks include but not limited to numbness, bleeding, infection. -discussed with Dr. Scot Dock and he would not ligate left arm fistula at this time until pt has working access.  Discussed this with pt and wife.  They expressed understanding.     Leontine Locket, [facility] [phone]  Clinic MD:  Scot Dock

## 2021-01-04 ENCOUNTER — Other Ambulatory Visit (HOSPITAL_COMMUNITY)
Admission: RE | Admit: 2021-01-04 | Discharge: 2021-01-04 | Disposition: A | Discharge status: Home / Self Care | Payer: Medicare Other | Source: Ambulatory Visit | Attending: Surgery | Admitting: Surgery

## 2021-01-04 DIAGNOSIS — Z01812 Encounter for preprocedural laboratory examination: Secondary | ICD-10-CM | POA: Insufficient documentation

## 2021-01-04 DIAGNOSIS — Z20822 Contact with and (suspected) exposure to covid-19: Secondary | ICD-10-CM | POA: Insufficient documentation

## 2021-01-04 LAB — SARS CORONAVIRUS 2 (TAT 6-24 HRS): SARS Coronavirus 2: NEGATIVE

## 2021-01-04 MED ORDER — CEFAZOLIN IN SODIUM CHLORIDE 3-0.9 GM/100ML-% IV SOLN
3.0000 g | INTRAVENOUS | Status: DC
  Filled 2021-01-04: qty 100

## 2021-01-04 NOTE — Progress Notes (Signed)
PCP - [place]  Cardiologist -none   PPM/ICD - none   Chest x-ray - 2016 EKG -05/06/2020  Stress Test - denies ECHO -05/18/20  Cardiac Cath - denies  Sleep Study -06/24/2019  CPAP - Yes, wears every night..   Fasting Blood Sugar - unknown;pt instructed to check blood sugar in am and call [phone] if it is less than 70.  Checks Blood Sugar 3-4 times a day   Aspirin Instructions:no Blood thinners:none  -   COVID TEST- scheduled for 4/20   Anesthesia review: no     All instructions explained to the patient's wife,Jean, with a verbal understanding of the material.   Patient also instructed to self quarantine after being tested for COVID-19. The opportunity to ask questions was provided.

## 2021-01-04 NOTE — Progress Notes (Signed)
Patient has insulin pump. During pre op call , instructed patient and his wife to decrease basal rate on insulin pump by 20% at midnight, but they both stated that they did not know how to do this. They said when patient had similar surgery in February that they had made no adjustments to insulin pump and the patient had no problems with his blood sugars at that time. Notified the diabetes coordinator, Adah Perl, of my conversation with patient and his wife regarding the insulin pump and surgery. She advised that patient should call patient's PCP,Dr. [name], who manages pt's insulin pump regarding adjustments for surgery. Called patient again. Spoke with his wife,Jean, and advised her to call Dr. Philip Aspen regarding questions about adjusting pump. She stated she would and thanked me.

## 2021-01-04 NOTE — Anesthesia Preprocedure Evaluation (Addendum)
Anesthesia Evaluation  Patient identified by MRN, date of birth, ID band Patient awake    Reviewed: Allergy & Precautions, H&P , NPO status , Patient's Chart, lab work & pertinent test results, reviewed documented beta blocker date and time   Airway Mallampati: III  TM Distance: >3 FB Neck ROM: Full    Dental no notable dental hx. (+) Teeth Intact, Dental Advisory Given   Pulmonary sleep apnea and Continuous Positive Airway Pressure Ventilation , former smoker,    Pulmonary exam normal breath sounds clear to auscultation       Cardiovascular Exercise Tolerance: Good hypertension, Pt. on medications and Pt. on home beta blockers negative cardio ROS   Rhythm:Regular Rate:Normal     Neuro/Psych Dementia negative neurological ROS     GI/Hepatic negative GI ROS, Neg liver ROS,   Endo/Other  diabetes, Insulin DependentHypothyroidism Morbid obesity  Renal/GU CRFRenal disease  negative genitourinary   Musculoskeletal  (+) Arthritis , Osteoarthritis,  Fibromyalgia -  Abdominal   Peds  Hematology negative hematology ROS (+)   Anesthesia Other Findings   Reproductive/Obstetrics negative OB ROS                            Anesthesia Physical Anesthesia Plan  ASA: III  Anesthesia Plan: MAC and Regional   Post-op Pain Management:    Induction: Intravenous  PONV Risk Score and Plan: 2 and Propofol infusion and Ondansetron  Airway Management Planned: Simple Face Mask  Additional Equipment:   Intra-op Plan:   Post-operative Plan:   Informed Consent: I have reviewed the patients History and Physical, chart, labs and discussed the procedure including the risks, benefits and alternatives for the proposed anesthesia with the patient or authorized representative who has indicated his/her understanding and acceptance.     Dental advisory given  Plan Discussed with: CRNA  Anesthesia Plan  Comments:        Anesthesia Quick Evaluation

## 2021-01-05 ENCOUNTER — Encounter (HOSPITAL_COMMUNITY): Payer: Self-pay

## 2021-01-05 ENCOUNTER — Ambulatory Visit (HOSPITAL_COMMUNITY): Payer: Medicare Other | Admitting: Certified Registered Nurse Anesthetist

## 2021-01-05 ENCOUNTER — Encounter (HOSPITAL_COMMUNITY)
Admission: RE | Disposition: A | Discharge status: Home / Self Care | Payer: Self-pay | Source: Home / Self Care | Attending: Surgery

## 2021-01-05 ENCOUNTER — Encounter (HOSPITAL_COMMUNITY): Payer: Self-pay | Admitting: Surgery

## 2021-01-05 ENCOUNTER — Other Ambulatory Visit: Payer: Self-pay

## 2021-01-05 ENCOUNTER — Ambulatory Visit (HOSPITAL_COMMUNITY)
Admission: RE | Admit: 2021-01-05 | Discharge: 2021-01-05 | Disposition: A | Discharge status: Home / Self Care | Payer: Medicare Other | Attending: Surgery | Admitting: Surgery

## 2021-01-05 ENCOUNTER — Emergency Department (HOSPITAL_COMMUNITY)
Admission: EM | Admit: 2021-01-05 | Discharge: 2021-01-05 | Disposition: A | Discharge status: Home / Self Care | Payer: Medicare Other | Source: Home / Self Care | Attending: Emergency Medicine | Admitting: Emergency Medicine

## 2021-01-05 DIAGNOSIS — E1122 Type 2 diabetes mellitus with diabetic chronic kidney disease: Secondary | ICD-10-CM | POA: Insufficient documentation

## 2021-01-05 DIAGNOSIS — E039 Hypothyroidism, unspecified: Secondary | ICD-10-CM | POA: Insufficient documentation

## 2021-01-05 DIAGNOSIS — Z79890 Hormone replacement therapy (postmenopausal): Secondary | ICD-10-CM | POA: Insufficient documentation

## 2021-01-05 DIAGNOSIS — L7622 Postprocedural hemorrhage and hematoma of skin and subcutaneous tissue following other procedure: Secondary | ICD-10-CM | POA: Diagnosis not present

## 2021-01-05 DIAGNOSIS — Z79899 Other long term (current) drug therapy: Secondary | ICD-10-CM | POA: Insufficient documentation

## 2021-01-05 DIAGNOSIS — Z6835 Body mass index (BMI) 35.0-35.9, adult: Secondary | ICD-10-CM | POA: Insufficient documentation

## 2021-01-05 DIAGNOSIS — Z91040 Latex allergy status: Secondary | ICD-10-CM | POA: Insufficient documentation

## 2021-01-05 DIAGNOSIS — Z888 Allergy status to other drugs, medicaments and biological substances status: Secondary | ICD-10-CM | POA: Insufficient documentation

## 2021-01-05 DIAGNOSIS — G8918 Other acute postprocedural pain: Secondary | ICD-10-CM

## 2021-01-05 DIAGNOSIS — Z4800 Encounter for change or removal of nonsurgical wound dressing: Secondary | ICD-10-CM | POA: Insufficient documentation

## 2021-01-05 DIAGNOSIS — Z5189 Encounter for other specified aftercare: Secondary | ICD-10-CM

## 2021-01-05 DIAGNOSIS — N184 Chronic kidney disease, stage 4 (severe): Secondary | ICD-10-CM

## 2021-01-05 DIAGNOSIS — R58 Hemorrhage, not elsewhere classified: Secondary | ICD-10-CM

## 2021-01-05 DIAGNOSIS — G309 Alzheimer's disease, unspecified: Secondary | ICD-10-CM | POA: Insufficient documentation

## 2021-01-05 DIAGNOSIS — T82838A Hemorrhage of vascular prosthetic devices, implants and grafts, initial encounter: Secondary | ICD-10-CM | POA: Insufficient documentation

## 2021-01-05 DIAGNOSIS — Z794 Long term (current) use of insulin: Secondary | ICD-10-CM | POA: Insufficient documentation

## 2021-01-05 DIAGNOSIS — Z87891 Personal history of nicotine dependence: Secondary | ICD-10-CM | POA: Insufficient documentation

## 2021-01-05 DIAGNOSIS — I129 Hypertensive chronic kidney disease with stage 1 through stage 4 chronic kidney disease, or unspecified chronic kidney disease: Secondary | ICD-10-CM | POA: Insufficient documentation

## 2021-01-05 HISTORY — PX: BASCILIC VEIN TRANSPOSITION: SHX5742

## 2021-01-05 LAB — POCT I-STAT, CHEM 8
BUN: 54 mg/dL — ABNORMAL HIGH (ref 8–23)
Calcium, Ion: 1.24 mmol/L (ref 1.15–1.40)
Chloride: 101 mmol/L (ref 98–111)
Creatinine, Ser: 3.9 mg/dL — ABNORMAL HIGH (ref 0.61–1.24)
Glucose, Bld: 81 mg/dL (ref 70–99)
HCT: 36 % — ABNORMAL LOW (ref 39.0–52.0)
Hemoglobin: 12.2 g/dL — ABNORMAL LOW (ref 13.0–17.0)
Potassium: 4 mmol/L (ref 3.5–5.1)
Sodium: 143 mmol/L (ref 135–145)
TCO2: 30 mmol/L (ref 22–32)

## 2021-01-05 LAB — GLUCOSE, CAPILLARY
Glucose-Capillary: 85 mg/dL (ref 70–99)
Glucose-Capillary: 93 mg/dL (ref 70–99)
Glucose-Capillary: 113 mg/dL — ABNORMAL HIGH (ref 70–99)

## 2021-01-05 SURGERY — TRANSPOSITION, VEIN, BASILIC
Anesthesia: Monitor Anesthesia Care | Site: Arm Upper | Laterality: Right

## 2021-01-05 MED ORDER — CHLORHEXIDINE GLUCONATE 0.12 % MT SOLN
15.0000 mL | Freq: Once | OROMUCOSAL | Status: AC
  Administered 2021-01-05: 15 mL via OROMUCOSAL
  Filled 2021-01-05: qty 15

## 2021-01-05 MED ORDER — LIDOCAINE-EPINEPHRINE (PF) 1.5 %-1:200000 IJ SOLN
INTRAMUSCULAR | Status: DC | PRN
  Administered 2021-01-05: 30 mL via PERINEURAL

## 2021-01-05 MED ORDER — BUPIVACAINE HCL (PF) 0.25 % IJ SOLN
INTRAMUSCULAR | Status: DC | PRN
  Administered 2021-01-05: 10 mL

## 2021-01-05 MED ORDER — 0.9 % SODIUM CHLORIDE (POUR BTL) OPTIME
TOPICAL | Status: DC | PRN
  Administered 2021-01-05: 1000 mL

## 2021-01-05 MED ORDER — PROPOFOL 500 MG/50ML IV EMUL
INTRAVENOUS | Status: DC | PRN
  Administered 2021-01-05: 50 ug/kg/min via INTRAVENOUS

## 2021-01-05 MED ORDER — PROPOFOL 10 MG/ML IV BOLUS
INTRAVENOUS | Status: AC
  Filled 2021-01-05: qty 20

## 2021-01-05 MED ORDER — CHLORHEXIDINE GLUCONATE 4 % EX LIQD: 60.0000 mL | Freq: Once | CUTANEOUS | Status: DC

## 2021-01-05 MED ORDER — PROPOFOL 10 MG/ML IV BOLUS
INTRAVENOUS | Status: DC | PRN
  Administered 2021-01-05: 20 mg via INTRAVENOUS

## 2021-01-05 MED ORDER — OXYCODONE-ACETAMINOPHEN 10-325 MG PO TABS: 1.0000 | ORAL_TABLET | Freq: Four times a day (QID) | ORAL | 0 refills | Status: DC | PRN

## 2021-01-05 MED ORDER — ORAL CARE MOUTH RINSE: 15.0000 mL | Freq: Once | OROMUCOSAL | Status: AC

## 2021-01-05 MED ORDER — FENTANYL CITRATE (PF) 250 MCG/5ML IJ SOLN
INTRAMUSCULAR | Status: AC
  Filled 2021-01-05: qty 5

## 2021-01-05 MED ORDER — ONDANSETRON HCL 4 MG/2ML IJ SOLN
INTRAMUSCULAR | Status: AC
  Filled 2021-01-05: qty 2

## 2021-01-05 MED ORDER — SODIUM CHLORIDE 0.9 % IV SOLN
INTRAVENOUS | Status: AC
  Filled 2021-01-05: qty 1.2

## 2021-01-05 MED ORDER — FENTANYL CITRATE (PF) 250 MCG/5ML IJ SOLN
INTRAMUSCULAR | Status: DC | PRN
  Administered 2021-01-05: 25 ug via INTRAVENOUS
  Administered 2021-01-05: 50 ug via INTRAVENOUS

## 2021-01-05 MED ORDER — DEXTROSE 5 % IV SOLN
INTRAVENOUS | Status: DC | PRN
  Administered 2021-01-05: 3 g via INTRAVENOUS

## 2021-01-05 MED ORDER — SODIUM CHLORIDE 0.9 % IV SOLN: INTRAVENOUS | Status: DC

## 2021-01-05 MED ORDER — LIDOCAINE-EPINEPHRINE (PF) 1 %-1:200000 IJ SOLN
INTRAMUSCULAR | Status: AC
  Filled 2021-01-05: qty 30

## 2021-01-05 MED ORDER — SODIUM CHLORIDE 0.9 % IV SOLN
INTRAVENOUS | Status: DC | PRN
  Administered 2021-01-05: 500 mL

## 2021-01-05 SURGICAL SUPPLY — 36 items
ADH SKN CLS APL DERMABOND .7 (GAUZE/BANDAGES/DRESSINGS) ×2
ARMBAND PINK RESTRICT EXTREMIT (MISCELLANEOUS) ×2 IMPLANT
BNDG ELASTIC 4X5.8 VLCR STR LF (GAUZE/BANDAGES/DRESSINGS) ×2 IMPLANT
CANISTER SUCT 3000ML PPV (MISCELLANEOUS) ×2 IMPLANT
CLIP VESOCCLUDE MED 24/CT (CLIP) ×2 IMPLANT
CLIP VESOCCLUDE MED 6/CT (CLIP) IMPLANT
CLIP VESOCCLUDE SM WIDE 24/CT (CLIP) ×2 IMPLANT
CLIP VESOCCLUDE SM WIDE 6/CT (CLIP) IMPLANT
COVER PROBE W GEL 5X96 (DRAPES) ×2 IMPLANT
COVER WAND RF STERILE (DRAPES) IMPLANT
DERMABOND ADVANCED (GAUZE/BANDAGES/DRESSINGS) ×2
DERMABOND ADVANCED .7 DNX12 (GAUZE/BANDAGES/DRESSINGS) ×2 IMPLANT
ELECT REM PT RETURN 9FT ADLT (ELECTROSURGICAL) ×2
ELECTRODE REM PT RTRN 9FT ADLT (ELECTROSURGICAL) ×1 IMPLANT
GLOVE BIOGEL PI IND STRL 7.5 (GLOVE) ×1 IMPLANT
GLOVE BIOGEL PI INDICATOR 7.5 (GLOVE) ×1
GLOVE SURG SS PI 7.5 STRL IVOR (GLOVE) ×2 IMPLANT
GOWN STRL REUS W/ TWL LRG LVL3 (GOWN DISPOSABLE) ×2 IMPLANT
GOWN STRL REUS W/ TWL XL LVL3 (GOWN DISPOSABLE) ×1 IMPLANT
GOWN STRL REUS W/TWL LRG LVL3 (GOWN DISPOSABLE) ×4
GOWN STRL REUS W/TWL XL LVL3 (GOWN DISPOSABLE) ×2
HEMOSTAT SNOW SURGICEL 2X4 (HEMOSTASIS) IMPLANT
KIT BASIN OR (CUSTOM PROCEDURE TRAY) ×2 IMPLANT
KIT TURNOVER KIT B (KITS) ×2 IMPLANT
NS IRRIG 1000ML POUR BTL (IV SOLUTION) ×2 IMPLANT
PACK CV ACCESS (CUSTOM PROCEDURE TRAY) ×2 IMPLANT
PAD ARMBOARD 7.5X6 YLW CONV (MISCELLANEOUS) ×4 IMPLANT
SUT PROLENE 5 0 C 1 24 (SUTURE) ×2 IMPLANT
SUT PROLENE 6 0 CC (SUTURE) ×4 IMPLANT
SUT SILK 2 0 SH (SUTURE) ×2 IMPLANT
SUT VIC AB 3-0 SH 27 (SUTURE) ×4
SUT VIC AB 3-0 SH 27X BRD (SUTURE) ×2 IMPLANT
SUT VICRYL 4-0 PS2 18IN ABS (SUTURE) ×2 IMPLANT
TOWEL GREEN STERILE (TOWEL DISPOSABLE) ×2 IMPLANT
UNDERPAD 30X36 HEAVY ABSORB (UNDERPADS AND DIAPERS) ×2 IMPLANT
WATER STERILE IRR 1000ML POUR (IV SOLUTION) ×2 IMPLANT

## 2021-01-05 NOTE — Anesthesia Procedure Notes (Signed)
Procedure Name: MAC Date/Time: 01/05/2021 7:50 AM Performed by: Colin Benton, CRNA Pre-anesthesia Checklist: Patient identified, Emergency Drugs available, Suction available and Patient being monitored Patient Re-evaluated:Patient Re-evaluated prior to induction Oxygen Delivery Method: Simple face mask Induction Type: IV induction Placement Confirmation: positive ETCO2 Dental Injury: Teeth and Oropharynx as per pre-operative assessment

## 2021-01-05 NOTE — ED Triage Notes (Signed)
Pt had a fistula placed in right upper arm today. Pt noticed bleeding at approximately 2000. Arm tightly wrapped on arrival.

## 2021-01-05 NOTE — Op Note (Signed)
    Patient name: Daniel Ochoa MRN: [id number] DOB: 1941/03/05 Sex: male  01/05/2021 Pre-operative Diagnosis: CKD4 Post-operative diagnosis:  Same Surgeon:  [name] Assistants: Risa Grill Procedure:   Second stage right basilic vein fistula Anesthesia: Regional block Blood Loss: Minimal Specimens: None  Findings: The majority of the vein was greater than 1 cm however there was a section approximately 6 cm long in the midportion of the vein which tapered down.  This did except a #5 dilator without difficulty.  Because he is not yet on dialysis, I did not think placing a interposition Gore-Tex graft was appropriate and this is probably too long of an area for a patch.  If he has difficulty with the fistula in the future this area will need to be evaluated.  Exposure was challenging because of the patient's inability to rotate his arm  Indications: The patient has previously undergone a for staged right basilic vein fistula creation by Dr. Scot Dock.  He comes in today for second stage procedure.  Procedure:  The patient was identified in the holding area and taken to [place] [facility]  The patient was then placed supine on the table. regional anesthesia was administered.  The patient was prepped and draped in the usual sterile fashion.  A time out was called and antibiotics were administered.  A PA was necessary to expedite the procedure and assist with technical details.  Ultrasound was used to evaluate the fistula in the upper arm.  It appeared to be adequate for second stage procedure.  Exposure was difficult because of the patient's inability to rotate his arm.  2 longitudinal incisions were made in the upper arm.  Cautery was used divide subcutaneous tissue down to the vein which was circumferentially exposed.  The nerve was protected throughout.  I mobilized the fistula from the antecubital crease up to the axilla.  Multiple side branches were ligated between silk ties.  Once it was  fully exposed it was marked for orientation.  A baby Belenda Cruise clamp was placed in the axilla and in the antecubital crease.  The vein was then transected near the antecubital crease.  A subcutaneous tunnel was then created with a Gore tunneler.  I then instilled heparinized saline into the vein.  The midportion of the vein did not dilate like the rest of the vein.  I was easily able to pass a #5 dilator.  I felt that dissection was too long to place a patch and I did not want to put an interposition graft in at this time.  I then brought the vein to the previously created tunnel.  I performed a end-to-end anastomosis with running 5-0 Prolene.  Once this was completed the clamps were released.  There was an excellent thrill within the fistula.  The wounds were then irrigated.  The deep tissue was reapproximated with 3-0 Vicryl and the skin was closed with 3-0 Vicryl followed by Dermabond.  An Ace wrap was placed on the i arm.  He was then taken to recovery in stable condition.  Disposition: To PACU stable.   Theotis Burrow, M.D., [facility] [place] Office: [phone] Pager:  [phone]

## 2021-01-05 NOTE — ED Notes (Signed)
Patient verbalizes understanding of discharge instructions. Follow-up care reviewed. Opportunity for questioning and answers were provided. Armband removed by staff, pt discharged from ED via wheelchair with wife.

## 2021-01-05 NOTE — ED Provider Notes (Signed)
[facility] EMERGENCY DEPARTMENT Provider Note   CSN: [id number] Arrival date & time: 01/05/21  2139     History Chief Complaint  Patient presents with  . Post-op Problem    Daniel Ochoa is a 80 y.o. male.  HPI  Patient is a 80 year old male with a chief complaint of bleeding in his right upper extremity fistula.  Patient states surgery earlier today placed in right upper arm.  Patient states that since the procedure he has been in relatively minimal distress.  However today he has trace right upper extremity blood.  Wound was placed in Ace wrap after the procedure and it had some running blood on it at home.  He was able to be stopped with paper towel and replacement of the Ace bandage.  Patient denies any lightheadedness and they state it was a relatively minimal amount of blood at home.  Patient denies fevers or chills, nausea vomit, syncope or shortness of breath.  Patient in no acute distress on arrival here.  Wound was taken down and minimal blood appreciated.  Past Medical History:  Diagnosis Date  . Acute deep vein thrombosis (DVT) of tibial vein of right lower extremity ([place]) 04/13/2014  . Acute pulmonary embolism ([place]) 04/15/2014  . Arthritis   . Chronic fatigue   . Chronic kidney disease, stage IV (severe) ([place])   . Complication of anesthesia    one surgery took 4 people to hold him down 2000  . Constipation   . Diabetic peripheral neuropathy ([place]) 10/26/2015  . DM (diabetes mellitus), type 2, uncontrolled, with renal complications ([place])    [place] [facility]  . DVT (deep venous thrombosis) ([place])    RLE DVT with intermediate risk VQ scan for PE 03/2014  . Dyspnea   . Essential hypertension   . Fibromyalgia   . Hypothyroidism   . Hypoxia 04/13/2014  . Neuropathy   . Obesity   . OSA on CPAP   . Pulmonary embolism ([place]) 04/13/2014   VQ 04/13/14 intermediate assoc with R DVT > on coumadin since Echo 04/14/14 > PA peak pressure: 89 mm Hg (S).      Patient Active Problem List   Diagnosis Date Noted  . Low back pain 12/17/2020  . Weakness 04/11/2020  . Paroxysmal atrial fibrillation ([place]) 02/22/2020  . Right foot drop 02/22/2020  . Shoulder joint pain 02/22/2020  . Sedative, hypnotic or anxiolytic dependence, uncomplicated ([place]) 58/85/0277  . Lipoprotein deficiency disorder 02/17/2019  . Muscle weakness 09/30/2018  . Long term (current) use of insulin ([place]) 01/23/2018  . Encounter for general adult medical examination without abnormal findings 01/07/2018  . Alzheimer's disease ([place]) 04/23/2017  . Diabetic renal disease ([place]) 04/23/2017  . Hypothyroidism 04/23/2017  . Morbid obesity ([place]) 04/23/2017  . Neck pain 04/23/2017  . Peripheral venous insufficiency 04/23/2017  . Retinal disorder 04/23/2017  . Diabetic peripheral neuropathy ([place]) 10/26/2015  . Dyspnea 07/23/2014  . Acute pulmonary embolism ([place]) 04/15/2014  . Hypoxia 04/13/2014  . Acute deep vein thrombosis (DVT) of tibial vein of right lower extremity ([place]) 04/13/2014  . Pulmonary embolism ([place]) 04/13/2014  . DM (diabetes mellitus), type 2, uncontrolled, with renal complications ([employer])   . Essential hypertension   . OSA on CPAP   . Chronic kidney disease, stage IV (severe) ([facility])     Past Surgical History:  Procedure Laterality Date  . AV FISTULA PLACEMENT Left 04/18/2017   Procedure: ARTERIOVENOUS (AV) FISTULA CREATION;  Surgeon: Angelia Mould, MD;  Location: [facility]  OR;  Service: Vascular;  Laterality: Left;  . BASCILIC VEIN TRANSPOSITION Right 11/14/2020   Procedure: BASCILIC VEIN TRANSPOSITION 1ST STAGE RIGHT;  Surgeon: Angelia Mould, MD;  Location: [facility];  Service: Vascular;  Laterality: Right;  . CERVICAL LAMINECTOMY    . LUMBAR FUSION     x 2  . ROTATOR CUFF REPAIR Right   . THYROIDECTOMY, PARTIAL         Family History  Problem Relation Age of Onset  . Cancer Father     Social History   Tobacco Use  . Smoking status: Former  Smoker    Years: 20.00    Types: Pipe  . Smokeless tobacco: Never Used  . Tobacco comment: quit in 1993  Vaping Use  . Vaping Use: Never used  Substance Use Topics  . Alcohol use: No  . Drug use: No    Home Medications Prior to Admission medications   Medication Sig Start Date End Date Taking? Authorizing Provider  Accu-Chek Softclix Lancets lancets USE TO CHECK BLOOD SUGAR UP TO TID E11.29 12/21/19   [provider]  amLODipine (NORVASC) 5 MG tablet Take 5 mg by mouth daily.  03/18/15   [provider]  atenolol (TENORMIN) 50 MG tablet Take 50 mg by mouth 2 (two) times daily.  01/31/14   [provider]  Calcium-Vitamin D-Vitamin K ([employer] CALCIUM SOFT CHEWS) 650-12.5-40 MG-MCG-MCG CHEW Chew 1 tablet by mouth 2 (two) times daily.    [provider]  carboxymethylcellulose (REFRESH PLUS) 0.5 % SOLN Place 1 drop into both eyes 3 (three) times daily as needed (allergies).    [provider]  clonazePAM (KLONOPIN) 1 MG tablet Take 1 mg by mouth at bedtime.  02/18/14   [provider]  DYMISTA 137-50 MCG/ACT SUSP Place 1 spray into both nostrils 2 (two) times daily as needed (congestion). 01/13/14   [provider]  famotidine (PEPCID) 40 MG tablet Take 40 mg by mouth at bedtime.  03/17/14   [provider]  FREESTYLE TEST STRIPS test strip USE AS DIRECTED TO TEST BLOOD SUGAR 5 TIMES PER DAY. DX: E11.65 06/06/18   [provider]  furosemide (LASIX) 40 MG tablet Take 40 mg by mouth See admin instructions. Take 40 mg twice daily, may take a third 40 mg dose as needed for swelling 10/20/14   [provider]  HUMALOG 100 UNIT/ML injection Inject 110 Units into the skin daily. PUMP calculate dosage 07/03/18   [provider]  HYDROcodone-acetaminophen (NORCO/VICODIN) 5-325 MG tablet Take 1 tablet by mouth 3 (three) times daily. 06/27/18   [provider]  ipratropium (ATROVENT) 0.03 % nasal spray Place 2  sprays into both nostrils daily as needed for rhinitis.    [provider]  Lancets Misc. (ACCU-CHEK FASTCLIX LANCET) KIT USE TO CHECK BLOOD SUGAR UP TO TID E11.29 12/23/19   [provider]  OVER THE COUNTER MEDICATION Apply 1 application topically daily as needed (pain). Hempvana Cream    [provider]  oxyCODONE-acetaminophen (PERCOCET) 10-325 MG tablet Take 1 tablet by mouth every 6 (six) hours as needed for pain. 01/05/21 01/05/22  Setzer, Edman Circle, PA-C  SYNTHROID 200 MCG tablet Take 200-300 mcg by mouth See admin instructions. Takes the 200 mcg daily except on Sundays take 300 mcg (or 1.5 tablets) 01/12/14   [provider]  vitamin C (ASCORBIC ACID) 250 MG tablet Take 250-500 mg by mouth See admin instructions. Take 250 mg in the morning and 500  mg at night    [provider]    Allergies    Statins and Latex  Review of Systems   Review of Systems  Constitutional: Negative for chills and fever.  HENT: Negative for ear pain and sore throat.   Eyes: Negative for pain and visual disturbance.  Respiratory: Negative for cough and shortness of breath.   Cardiovascular: Negative for chest pain and palpitations.  Gastrointestinal: Negative for abdominal pain and vomiting.  Genitourinary: Negative for dysuria and hematuria.  Musculoskeletal: Negative for arthralgias and back pain.  Skin: Negative for color change and rash.  Neurological: Negative for seizures and syncope.  All other systems reviewed and are negative.   Physical Exam Updated Vital Signs BP (!) 111/54 (BP Location: Left Arm)   Pulse 62   Temp 98.7 F (37.1 C) (Oral)   Resp 18   SpO2 97%   Physical Exam Vitals and nursing note reviewed.  Constitutional:      Appearance: He is well-developed.  HENT:     Head: Normocephalic and atraumatic.  Eyes:     Conjunctiva/sclera: Conjunctivae normal.  Cardiovascular:     Rate and Rhythm: Normal rate and regular rhythm.     Heart  sounds: No murmur heard.   Pulmonary:     Effort: Pulmonary effort is normal. No respiratory distress.     Breath sounds: Normal breath sounds.  Abdominal:     Palpations: Abdomen is soft.     Tenderness: There is no abdominal tenderness.  Musculoskeletal:        General: No swelling or tenderness. Normal range of motion.     Cervical back: Neck supple.     Comments: Took down right upper extremity dressing.  Surgical site appears clean dry and intact.  Surgical glue still intact.  Patient with approximately 2 cc of bleeding running down his right upper extremity after dressing was taken down for 5 minutes.  Palpable thrill still appreciated and distal pulses appreciated.  Skin:    General: Skin is warm and dry.  Neurological:     Mental Status: He is alert.       ED Results / Procedures / Treatments   Labs (all labs ordered are listed, but only abnormal results are displayed) Labs Reviewed - No data to display  EKG None  Radiology No results found.  Procedures Procedures   Medications Ordered in ED Medications - No data to display  ED Course  I have reviewed the triage vital signs and the nursing notes.  Pertinent labs & imaging results that were available during my care of the patient were reviewed by me and considered in my medical decision making (see chart for details).    MDM Rules/Calculators/A&P                          Patient is a 80 year old male with a minimal medical history who had a right upper extremity fistula revision today.  Patient has some bleeding at the site and was told to come in for any bleeding.  Relatively minimal bleeding on physical exam here patient in no acute distress with appropriate vital signs.  Family describes very minimal blood loss at home. Took down dressing and only minimal bleeding appreciated here.  Palpable thrill is still appreciated as well.  Redressed arm with gauze under mild tension.  Communicated with vascular surgery  regarding presentation and they stated the current amount of bleeding does not raise concern.  Reviewed the picture with them and they stated they will plan to see in the outpatient setting.   Disposition: Based on the above findings, I believe patient is stable for discharge.   Patient/family educated about specific return precautions for given chief complaint and symptoms.  Patient/family educated about follow-up with PCP and vascular.  Patient/family expressed understanding of return precautions and need for follow-up. Patient spoken to regarding all imaging and laboratory results and appropriate follow up for these results. All education provided in verbal form with additional information in written form. Time was allowed for answering of patient questions. Patient discharged.   Emergency Department Medication Summary: Medications - No data to display   Final Clinical Impression(s) / ED Diagnoses Final diagnoses:  Bleeding  Visit for wound check  Post-operative pain    Rx / DC Orders ED Discharge Orders    None       Tretha Sciara, MD 01/05/21 2259    Drenda Freeze, MD 01/05/21 2308

## 2021-01-05 NOTE — Transfer of Care (Signed)
Immediate Anesthesia Transfer of Care Note  Patient: Daniel Ochoa  Procedure(s) Performed: RIGHT SECOND STAGE BASCILIC VEIN TRANSPOSITION (Right Arm Upper)  Patient Location: PACU  Anesthesia Type:MAC combined with regional for post-op pain  Level of Consciousness: drowsy and patient cooperative  Airway & Oxygen Therapy: Patient Spontanous Breathing  Post-op Assessment: Report given to RN and Post -op Vital signs reviewed and stable  Post vital signs: Reviewed and stable  Last Vitals:  Vitals Value Taken Time  BP 149/61 01/05/21 0949  Temp    Pulse 60 01/05/21 0950  Resp 18 01/05/21 0950  SpO2 96 % 01/05/21 0950  Vitals shown include unvalidated device data.  Last Pain:  Vitals:   01/05/21 0629  TempSrc:   PainSc: 0-No pain      Patients Stated Pain Goal: 5 (43/60/16 5800)  Complications: No complications documented.

## 2021-01-05 NOTE — Interval H&P Note (Signed)
History and Physical Interval Note:  01/05/2021 7:25 AM  Daniel Ochoa  has presented today for surgery, with the diagnosis of CKD.  The various methods of treatment have been discussed with the patient and family. After consideration of risks, benefits and other options for treatment, the patient has consented to  Procedure(s): RIGHT SECOND STAGE [place] (Right) as a surgical intervention.  The patient's history has been reviewed, patient examined, no change in status, stable for surgery.  I have reviewed the patient's chart and labs.  Questions were answered to the patient's satisfaction.     [name]

## 2021-01-05 NOTE — ED Triage Notes (Signed)
Emergency Medicine Provider Triage Evaluation Note  Daniel Ochoa , a 80 y.o. male  was evaluated in triage.  Pt complains of bleeding from right upper arm fistula placed today. Wife applied dressing, EMS assessed and no further bleeding.  Review of Systems  Positive: bleeding Negative: weakness  Physical Exam  There were no vitals taken for this visit. Gen:   Awake, no distress   HEENT:  Atraumatic  Resp:  Normal effort  Cardiac:  Normal rate  Abd:   Nondistended, nontender  MSK:   Moves extremities without difficulty. Dressing in place right upper arm, no active bleeding through dressing, radial pulse present Neuro:  Speech clear   Medical Decision Making  Medically screening exam initiated at 9:52 PM.  Appropriate orders placed.  Daniel Ochoa was informed that the remainder of the evaluation will be completed by another provider, this initial triage assessment does not replace that evaluation, and the importance of remaining in the ED until their evaluation is complete.  Clinical Impression     Daniel Ochoa 01/05/21 2153

## 2021-01-05 NOTE — Discharge Instructions (Signed)
° °  [facility] [place] ° °Discharge Instructions ° °AV Fistula or Graft Surgery for Dialysis Access ° °Please refer to the following instructions for your post-procedure care. Your surgeon or physician assistant will discuss any changes with you. ° °Activity ° °You may drive the day following your surgery, if you are comfortable and no longer taking prescription pain medication. Resume full activity as the soreness in your incision resolves. ° °Bathing/Showering ° °You may shower after you go home. Keep your incision dry for 48 hours. Do not soak in a bathtub, hot tub, or swim until the incision heals completely. You may not shower if you have a hemodialysis catheter. ° °Incision Care ° °Clean your incision with mild soap and water after 48 hours. Pat the area dry with a clean towel. You do not need a bandage unless otherwise instructed. Do not apply any ointments or creams to your incision. You may have skin glue on your incision. Do not peel it off. It will come off on its own in about one week. Your arm may swell a bit after surgery. To reduce swelling use pillows to elevate your arm so it is above your heart. Your doctor will tell you if you need to lightly wrap your arm with an ACE bandage. ° °Diet ° °Resume your normal diet. There are not special food restrictions following this procedure. In order to heal from your surgery, it is CRITICAL to get adequate nutrition. Your body requires vitamins, minerals, and protein. Vegetables are the best source of vitamins and minerals. Vegetables also provide the perfect balance of protein. Processed food has little nutritional value, so try to avoid this. ° °Medications ° °Resume taking all of your medications. If your incision is causing pain, you may take over-the counter pain relievers such as acetaminophen (Tylenol). If you were prescribed a stronger pain medication, please be aware these medications can cause nausea and constipation. Prevent  nausea by taking the medication with a snack or meal. Avoid constipation by drinking plenty of fluids and eating foods with high amount of fiber, such as fruits, vegetables, and grains. Do not take Tylenol if you are taking prescription pain medications. ° ° ° ° °Follow up °Your surgeon may want to see you in the office following your access surgery. If so, this will be arranged at the time of your surgery. ° °Please call us immediately for any of the following conditions: ° °Increased pain, redness, drainage (pus) from your incision site °Fever of 101 degrees or higher °Severe or worsening pain at your incision site °Hand pain or numbness. ° °Reduce your risk of vascular disease: ° °Stop smoking. If you would like help, call [facility] at 1-800-QUIT-NOW (1-800-784-8669) or [facility] at 336-586-4000 ° °Manage your cholesterol °Maintain a desired weight °Control your diabetes °Keep your blood pressure down ° °Dialysis ° °It will take several weeks to several months for your new dialysis access to be ready for use. Your surgeon will determine when it is OK to use it. Your nephrologist will continue to direct your dialysis. You can continue to use your Permcath until your new access is ready for use. ° °If you have any questions, please call the office at [phone]. ° °

## 2021-01-05 NOTE — Anesthesia Procedure Notes (Signed)
Anesthesia Regional Block: Supraclavicular block   Pre-Anesthetic Checklist: ,, timeout performed, Correct Patient, Correct Site, Correct Laterality, Correct Procedure, Correct Position, site marked, Risks and benefits discussed, pre-op evaluation,  At surgeon's request and post-op pain management  Laterality: Right  Prep: Maximum Sterile Barrier Precautions used, chloraprep       Needles:  Injection technique: Single-shot  Needle Type: Echogenic Stimulator Needle     Needle Length: 5cm  Needle Gauge: 22     Additional Needles:   Procedures:,,,, ultrasound used (permanent image in chart),,,,  Narrative:  Start time: 01/05/2021 7:02 AM End time: 01/05/2021 7:12 AM Injection made incrementally with aspirations every 5 mL. Anesthesiologist: Roderic Palau, MD  Additional Notes: 2% Lidocaine skin wheel. Intercostobrachial block with 10cc 0.25% Bupivicaine plain

## 2021-01-05 NOTE — Discharge Instructions (Addendum)
You were seen today for postoperative pain and bleeding.  Your evaluation of the wound has no acute abnormalities.  We discussed the case with the vascular surgeons we will plan to see you either tomorrow or the next day in clinic.  They should call you to schedule this follow-up.  Please return with any changes in the bleeding including soaking through the current bandage. Any lightheadedness, chest pain, shortness of breath should prompt a quick return to the emergency department.  Thank for the opportunity to take a part of your care today.

## 2021-01-05 NOTE — Anesthesia Postprocedure Evaluation (Signed)
Anesthesia Post Note  Patient: Daniel Ochoa  Procedure(s) Performed: RIGHT SECOND STAGE BASCILIC VEIN TRANSPOSITION (Right Arm Upper)     Patient location during evaluation: PACU Anesthesia Type: Regional and MAC Level of consciousness: awake and alert Pain management: pain level controlled Vital Signs Assessment: post-procedure vital signs reviewed and stable Respiratory status: spontaneous breathing, nonlabored ventilation and respiratory function stable Cardiovascular status: stable and blood pressure returned to baseline Postop Assessment: no apparent nausea or vomiting Anesthetic complications: no   No complications documented.  Last Vitals:  Vitals:   01/05/21 1020 01/05/21 1035  BP: (!) 159/67 (!) 158/65  Pulse: (!) 56 60  Resp: 18 15  Temp:    SpO2: 95% 93%    Last Pain:  Vitals:   01/05/21 1020  TempSrc:   PainSc: 0-No pain                 Marica Trentham,W. EDMOND

## 2021-01-06 ENCOUNTER — Encounter (HOSPITAL_COMMUNITY): Payer: Self-pay | Admitting: Surgery

## 2021-01-06 ENCOUNTER — Ambulatory Visit (INDEPENDENT_AMBULATORY_CARE_PROVIDER_SITE_OTHER): Payer: Medicare Other | Admitting: Physician Assistant

## 2021-01-06 ENCOUNTER — Telehealth: Payer: Self-pay

## 2021-01-06 VITALS — BP 88/39 | HR 66 | Temp 98.6°F | Resp 20 | Ht 73.0 in

## 2021-01-06 DIAGNOSIS — T8189XA Other complications of procedures, not elsewhere classified, initial encounter: Secondary | ICD-10-CM

## 2021-01-06 DIAGNOSIS — N184 Chronic kidney disease, stage 4 (severe): Secondary | ICD-10-CM

## 2021-01-06 NOTE — Telephone Encounter (Signed)
Patient seen in ED last night for bleeding from surgical site s/p BVT. Per VWB note, patient to be seen today. Wife states "I am really afraid to take off bandage." Added to PA schedule for wound check.

## 2021-01-06 NOTE — Progress Notes (Signed)
POST OPERATIVE DIALYSIS ACCESS OFFICE NOTE    CC:  F/u for dialysis access surgery  HPI:  This is a 80 y.o. male who is s/p second stage right basilic vein transposition yesterday by Dr. Trula Slade.  Upon arriving at home, his wife noticed bleeding at the inferior aspect of his distal incision.  Because the office was closed, she called 911 who assessed the patient and recommended going to the emergency department.  He arrived there and a small amount of blood was noted.  Dr. Trula Slade was notified and pressure dressing applied.  His wife accompanies him today.  His low blood pressure was noted upon rooming.  He denies syncope, near syncope, dizziness or shortness of breath.  Not yet requiring hemodialysis.  His wife accompanies him today.  Allergies  Allergen Reactions  . Statins Other (See Comments)    Elevated CK level  . Latex Rash    Severe blistery rash    Current Outpatient Medications  Medication Sig Dispense Refill  . Accu-Chek Softclix Lancets lancets USE TO CHECK BLOOD SUGAR UP TO TID E11.29    . amLODipine (NORVASC) 5 MG tablet Take 5 mg by mouth daily.     Marland Kitchen atenolol (TENORMIN) 50 MG tablet Take 50 mg by mouth 2 (two) times daily.     . Calcium-Vitamin D-Vitamin K ([employer] CALCIUM SOFT CHEWS) 650-12.5-40 MG-MCG-MCG CHEW Chew 1 tablet by mouth 2 (two) times daily.    . carboxymethylcellulose (REFRESH PLUS) 0.5 % SOLN Place 1 drop into both eyes 3 (three) times daily as needed (allergies).    . clonazePAM (KLONOPIN) 1 MG tablet Take 1 mg by mouth at bedtime.     . DYMISTA 137-50 MCG/ACT SUSP Place 1 spray into both nostrils 2 (two) times daily as needed (congestion).    . famotidine (PEPCID) 40 MG tablet Take 40 mg by mouth at bedtime.     Marland Kitchen FREESTYLE TEST STRIPS test strip USE AS DIRECTED TO TEST BLOOD SUGAR 5 TIMES PER DAY. DX: E11.65  11  . furosemide (LASIX) 40 MG tablet Take 40 mg by mouth See admin instructions. Take 40 mg twice daily, may take a third 40 mg dose as needed for  swelling    . HUMALOG 100 UNIT/ML injection Inject 110 Units into the skin daily. PUMP calculate dosage  2  . HYDROcodone-acetaminophen (NORCO/VICODIN) 5-325 MG tablet Take 1 tablet by mouth 3 (three) times daily.  0  . ipratropium (ATROVENT) 0.03 % nasal spray Place 2 sprays into both nostrils daily as needed for rhinitis.    . Lancets Misc. (ACCU-CHEK FASTCLIX LANCET) KIT USE TO CHECK BLOOD SUGAR UP TO TID E11.29    . OVER THE COUNTER MEDICATION Apply 1 application topically daily as needed (pain). Hempvana Cream    . oxyCODONE-acetaminophen (PERCOCET) 10-325 MG tablet Take 1 tablet by mouth every 6 (six) hours as needed for pain. 8 tablet 0  . SYNTHROID 200 MCG tablet Take 200-300 mcg by mouth See admin instructions. Takes the 200 mcg daily except on Sundays take 300 mcg (or 1.5 tablets)    . vitamin C (ASCORBIC ACID) 250 MG tablet Take 250-500 mg by mouth See admin instructions. Take 250 mg in the morning and 500 mg at night     No current facility-administered medications for this visit.     ROS:  See HPI  BP (!) 88/39 (BP Location: Left Arm, Patient Position: Sitting, Cuff Size: Large)   Pulse 66   Temp 98.6 F (37 C) (Temporal)  Resp 20   Ht '6\' 1"'  (1.854 m)   SpO2 95%   BMI 35.62 kg/m    Physical Exam:  General appearance: Well-developed, well-nourished male in no apparent distress.  He arrives in a wheelchair Cardiac: Heart rate and rhythm are regular Respiratory: Nonlabored Incision: Right upper arm incisions are well approximated without bleeding or hematoma.  There is moderate ecchymosis surrounding his incisions. Extremities: Right hand is warm with 5/5 grip strength.  2+ radial pulse.  Motor function and sensation intact.  Fistula with good thrill and bruit.   Assessment/Plan:   -pt does not have evidence of steal syndrome -No ongoing bleeding or hematoma -Ace wrap with gentle compression reapplied  We discussed his low blood pressure reading.  We reviewed his  antihypertensive medications.  I recommended holding his Norvasc in the morning and taking one half of his beta-blocker.  He also takes Lasix 40 mg twice daily with an additional 40 mg dose as needed for edema.  The patient's wife states he has been taking this third dose due to lower extremity edema for approximately 2 weeks.  They have a blood pressure cuff at home and I advised her to take his blood pressure tonight and in the morning.  If his blood pressure does not normalize to baseline, notify PCP.  Also cautioned him about rising from sitting and supine positions.  Barbie Banner, PA-C 01/06/2021 1:48 PM [facility] [phone]  Clinic MD:  Dr. Stanford Breed on-call

## 2021-01-09 DIAGNOSIS — E1122 Type 2 diabetes mellitus with diabetic chronic kidney disease: Secondary | ICD-10-CM | POA: Diagnosis not present

## 2021-01-09 DIAGNOSIS — N189 Chronic kidney disease, unspecified: Secondary | ICD-10-CM | POA: Diagnosis not present

## 2021-01-09 DIAGNOSIS — D631 Anemia in chronic kidney disease: Secondary | ICD-10-CM | POA: Diagnosis not present

## 2021-01-09 DIAGNOSIS — N185 Chronic kidney disease, stage 5: Secondary | ICD-10-CM | POA: Diagnosis not present

## 2021-01-09 DIAGNOSIS — I120 Hypertensive chronic kidney disease with stage 5 chronic kidney disease or end stage renal disease: Secondary | ICD-10-CM | POA: Diagnosis not present

## 2021-01-18 ENCOUNTER — Ambulatory Visit (INDEPENDENT_AMBULATORY_CARE_PROVIDER_SITE_OTHER): Payer: Medicare Other | Admitting: Podiatry

## 2021-01-18 ENCOUNTER — Other Ambulatory Visit: Payer: Self-pay

## 2021-01-18 ENCOUNTER — Encounter: Payer: Self-pay | Admitting: Podiatry

## 2021-01-18 DIAGNOSIS — M79674 Pain in right toe(s): Secondary | ICD-10-CM

## 2021-01-18 DIAGNOSIS — M79675 Pain in left toe(s): Secondary | ICD-10-CM | POA: Diagnosis not present

## 2021-01-18 DIAGNOSIS — L601 Onycholysis: Secondary | ICD-10-CM | POA: Diagnosis not present

## 2021-01-18 DIAGNOSIS — B351 Tinea unguium: Secondary | ICD-10-CM | POA: Diagnosis not present

## 2021-01-18 DIAGNOSIS — E1142 Type 2 diabetes mellitus with diabetic polyneuropathy: Secondary | ICD-10-CM

## 2021-01-27 NOTE — Progress Notes (Signed)
  Subjective:  Patient ID: Daniel Ochoa, male    DOB: Jun 24, 1941,  MRN: [id number]  80 y.o. male presents with at risk foot care with history of diabetic neuropathy and painful thick toenails that are difficult to trim. Pain interferes with ambulation. Aggravating factors include wearing enclosed shoe gear. Pain is relieved with periodic professional debridement.   His wife is present during today's visit.  Patient's blood sugar was 117 mg/dl this morning.  PCP: [name], MD and last visit was: 4 months ago.  Review of Systems: Negative except as noted in the HPI.   Allergies  Allergen Reactions  . Statins Other (See Comments)    Elevated CK level  . Latex Rash    Severe blistery rash    Objective:  There were no vitals filed for this visit. Constitutional Patient is a pleasant 80 y.o. Caucasian male morbidly obese in NAD. AAO x 3.  Vascular Capillary fill time to digits <3 seconds b/l lower extremities. Palpable pedal pulses b/l LE. Pedal hair absent. Lower extremity skin temperature gradient within normal limits. Nonpitting edema noted b/l lower extremities. No cyanosis or clubbing noted.  Neurologic Normal speech. Protective sensation intact 5/5 intact bilaterally with 10g monofilament b/l. Vibratory sensation diminished b/l.  Dermatologic Pedal skin with normal turgor, texture and tone bilaterally. No open wounds bilaterally. No interdigital macerations bilaterally. Toenails 2-5 bilaterally and R hallux elongated, discolored, dystrophic, thickened, and crumbly with subungual debris and tenderness to dorsal palpation. There is noted onchyolysis of entire nailplate of L hallux.  The nailbeds remain intact. There is no erythema, no edema, no drainage, no underlying fluctuance.  Orthopedic: Normal muscle strength 5/5 to all lower extremity muscle groups bilaterally. No pain crepitus or joint limitation noted with ROM b/l. Hallux valgus with bunion deformity noted b/l lower  extremities. Hammertoes noted to the 2-5 bilaterally.   Assessment:   1. Pain due to onychomycosis of toenails of both feet   2. Onycholysis   3. Diabetic peripheral neuropathy associated with type 2 diabetes mellitus ([place])    Plan:  Patient was evaluated and treated and all questions answered.  Onychomycosis with pain -Nails palliatively debridement as below. -Educated on self-care  Procedure: Nail Debridement Rationale: Pain Type of Debridement: manual, sharp debridement. Instrumentation: Nail nipper, rotary burr. Number of Nails: 10  -Examined patient. -Patient to continue soft, supportive shoe gear daily. -Toenails 2-5 bilaterally and R hallux debrided in length and girth without iatrogenic bleeding with sterile nail nipper and dremel.  -Patient to report any pedal injuries to medical professional immediately. -Loose nailplate left hallux removed from its remaining attachment. Light bleeding managed with Lumicain Hemostatic Solution. Digit cleansed with alcohol. Triple antibiotic ointment applied. Wife instructed to apply Mupirocin Ointment to nailbed once daily for one week. She is to call should there be any concerns. They related understanding. -Patient/POA to call should there be question/concern in the interim.  Return in about 3 months (around 04/20/2021).  Marzetta Board, DPM

## 2021-01-30 ENCOUNTER — Other Ambulatory Visit: Payer: Self-pay

## 2021-01-30 ENCOUNTER — Ambulatory Visit (INDEPENDENT_AMBULATORY_CARE_PROVIDER_SITE_OTHER): Payer: Medicare Other | Admitting: Physician Assistant

## 2021-01-30 ENCOUNTER — Encounter: Payer: Self-pay | Admitting: Physician Assistant

## 2021-01-30 VITALS — BP 120/80 | HR 68 | Temp 98.7°F | Resp 20 | Ht 73.0 in | Wt 267.8 lb

## 2021-01-30 DIAGNOSIS — N184 Chronic kidney disease, stage 4 (severe): Secondary | ICD-10-CM

## 2021-01-31 ENCOUNTER — Encounter: Payer: Self-pay | Admitting: Physician Assistant

## 2021-01-31 NOTE — Progress Notes (Signed)
    Postoperative Access Visit   History of Present Illness   Daniel Ochoa is a 80 y.o. year old male who presents for postoperative follow-up for: right second stage basilic vein transposition by Dr. Trula Slade (Date: 01/05/21).  The patient's wounds are healed.  The patient denies steal symptoms.  The patient is able to complete their activities of daily living.  He is not yet on HD.  CKD is managed by Dr. Moshe Cipro.  He has had a failed fistula creation in L UE.   Physical Examination   Vitals:   01/30/21 1511  BP: 120/80  Pulse: 68  Resp: 20  Temp: 98.7 F (37.1 C)  TempSrc: Temporal  SpO2: 97%  Weight: 267 lb 12.8 oz (121.5 kg)  Height: 6\' 1"  (1.854 m)   Body mass index is 35.33 kg/m.  right arm Incisions are healed, palpable radial pulse, hand grip is 5/5, sensation in digits is intact, palpable thrill, bruit can be auscultated     Medical Decision Making   Daniel Ochoa is a 80 y.o. year old male who presents s/p right second stage basilic vein transposition   Patent basilic vein fistula without signs or symptoms of steal syndrome  The patient's access is ready for use  The patient may follow up on a prn basis   Dagoberto Ligas PA-C [facility] [place] Office: [facility] MD: Trula Slade

## 2021-03-03 DIAGNOSIS — N185 Chronic kidney disease, stage 5: Secondary | ICD-10-CM | POA: Diagnosis not present

## 2021-03-03 DIAGNOSIS — M21371 Foot drop, right foot: Secondary | ICD-10-CM | POA: Diagnosis not present

## 2021-03-03 DIAGNOSIS — I480 Paroxysmal atrial fibrillation: Secondary | ICD-10-CM | POA: Diagnosis not present

## 2021-03-03 DIAGNOSIS — E1129 Type 2 diabetes mellitus with other diabetic kidney complication: Secondary | ICD-10-CM | POA: Diagnosis not present

## 2021-03-03 DIAGNOSIS — Z794 Long term (current) use of insulin: Secondary | ICD-10-CM | POA: Diagnosis not present

## 2021-03-03 DIAGNOSIS — E039 Hypothyroidism, unspecified: Secondary | ICD-10-CM | POA: Diagnosis not present

## 2021-03-03 DIAGNOSIS — N390 Urinary tract infection, site not specified: Secondary | ICD-10-CM | POA: Diagnosis not present

## 2021-03-03 DIAGNOSIS — I10 Essential (primary) hypertension: Secondary | ICD-10-CM | POA: Diagnosis not present

## 2021-03-03 DIAGNOSIS — G4733 Obstructive sleep apnea (adult) (pediatric): Secondary | ICD-10-CM | POA: Diagnosis not present

## 2021-03-06 DIAGNOSIS — H353131 Nonexudative age-related macular degeneration, bilateral, early dry stage: Secondary | ICD-10-CM | POA: Diagnosis not present

## 2021-03-06 DIAGNOSIS — H18593 Other hereditary corneal dystrophies, bilateral: Secondary | ICD-10-CM | POA: Diagnosis not present

## 2021-03-06 DIAGNOSIS — Z961 Presence of intraocular lens: Secondary | ICD-10-CM | POA: Diagnosis not present

## 2021-03-07 DIAGNOSIS — N185 Chronic kidney disease, stage 5: Secondary | ICD-10-CM | POA: Diagnosis not present

## 2021-03-07 DIAGNOSIS — I120 Hypertensive chronic kidney disease with stage 5 chronic kidney disease or end stage renal disease: Secondary | ICD-10-CM | POA: Diagnosis not present

## 2021-03-07 DIAGNOSIS — D631 Anemia in chronic kidney disease: Secondary | ICD-10-CM | POA: Diagnosis not present

## 2021-03-07 DIAGNOSIS — R609 Edema, unspecified: Secondary | ICD-10-CM | POA: Diagnosis not present

## 2021-03-07 DIAGNOSIS — E1122 Type 2 diabetes mellitus with diabetic chronic kidney disease: Secondary | ICD-10-CM | POA: Diagnosis not present

## 2021-04-17 DIAGNOSIS — M7989 Other specified soft tissue disorders: Secondary | ICD-10-CM | POA: Diagnosis not present

## 2021-04-17 DIAGNOSIS — I120 Hypertensive chronic kidney disease with stage 5 chronic kidney disease or end stage renal disease: Secondary | ICD-10-CM | POA: Diagnosis not present

## 2021-04-17 DIAGNOSIS — D631 Anemia in chronic kidney disease: Secondary | ICD-10-CM | POA: Diagnosis not present

## 2021-04-17 DIAGNOSIS — N390 Urinary tract infection, site not specified: Secondary | ICD-10-CM | POA: Diagnosis not present

## 2021-04-17 DIAGNOSIS — E1122 Type 2 diabetes mellitus with diabetic chronic kidney disease: Secondary | ICD-10-CM | POA: Diagnosis not present

## 2021-04-17 DIAGNOSIS — N185 Chronic kidney disease, stage 5: Secondary | ICD-10-CM | POA: Diagnosis not present

## 2021-04-24 ENCOUNTER — Other Ambulatory Visit: Payer: Self-pay

## 2021-04-24 ENCOUNTER — Encounter: Payer: Self-pay | Admitting: Podiatry

## 2021-04-24 ENCOUNTER — Ambulatory Visit (INDEPENDENT_AMBULATORY_CARE_PROVIDER_SITE_OTHER): Payer: Medicare Other | Admitting: Podiatry

## 2021-04-24 DIAGNOSIS — M79675 Pain in left toe(s): Secondary | ICD-10-CM

## 2021-04-24 DIAGNOSIS — B351 Tinea unguium: Secondary | ICD-10-CM

## 2021-04-24 DIAGNOSIS — M79674 Pain in right toe(s): Secondary | ICD-10-CM | POA: Diagnosis not present

## 2021-04-24 DIAGNOSIS — L601 Onycholysis: Secondary | ICD-10-CM

## 2021-04-24 DIAGNOSIS — E1142 Type 2 diabetes mellitus with diabetic polyneuropathy: Secondary | ICD-10-CM

## 2021-04-26 DIAGNOSIS — I519 Heart disease, unspecified: Secondary | ICD-10-CM | POA: Diagnosis not present

## 2021-04-26 DIAGNOSIS — E1122 Type 2 diabetes mellitus with diabetic chronic kidney disease: Secondary | ICD-10-CM | POA: Diagnosis not present

## 2021-04-26 DIAGNOSIS — Z794 Long term (current) use of insulin: Secondary | ICD-10-CM | POA: Diagnosis not present

## 2021-04-26 DIAGNOSIS — G4734 Idiopathic sleep related nonobstructive alveolar hypoventilation: Secondary | ICD-10-CM | POA: Diagnosis not present

## 2021-04-26 DIAGNOSIS — I2721 Secondary pulmonary arterial hypertension: Secondary | ICD-10-CM | POA: Diagnosis not present

## 2021-04-26 DIAGNOSIS — I10 Essential (primary) hypertension: Secondary | ICD-10-CM | POA: Diagnosis not present

## 2021-04-26 DIAGNOSIS — G4733 Obstructive sleep apnea (adult) (pediatric): Secondary | ICD-10-CM | POA: Diagnosis not present

## 2021-04-26 DIAGNOSIS — N184 Chronic kidney disease, stage 4 (severe): Secondary | ICD-10-CM | POA: Diagnosis not present

## 2021-04-27 NOTE — Progress Notes (Signed)
Subjective: Daniel Ochoa is a pleasant 80 y.o. male patient seen today painful thick toenails that are difficult to trim. Pain interferes with ambulation. Aggravating factors include wearing enclosed shoe gear. Pain is relieved with periodic professional debridement.  He is seen for at risk foot care with h/o diabetic neuropathy.His blood sugar was 250 mg/dl yesterday after dinner.  His wife is present during today's visit.   PCP is [name], MD. Last visit was: 03/03/2021.  Allergies  Allergen Reactions   Statins Other (See Comments)    Elevated CK level   Latex Rash    Severe blistery rash    Objective: Physical Exam  General: Daniel Ochoa is a pleasant 80 y.o. Caucasian male, morbidly obese in NAD. AAO x 3.   Vascular:  Capillary fill time to digits <3 seconds b/l lower extremities. Palpable DP pulse(s) b/l lower extremities Palpable PT pulse(s) b/l lower extremities Pedal hair present. Lower extremity skin temperature gradient within normal limits. Nonpitting edema noted b/l lower extremities.  Dermatological:  Skin warm and supple b/l lower extremities. No open wounds b/l lower extremities. No interdigital macerations b/l lower extremities. Toenails b/l lower extremities elongated, discolored, dystrophic, thickened, and crumbly with subungual debris and tenderness to dorsal palpation. There is noted onchyolysis of entire nailplate of L hallux.  The nailbed remains intact. There is no erythema, no edema, no drainage, no underlying fluctuance.  Musculoskeletal:  Normal muscle strength 5/5 to all lower extremity muscle groups bilaterally. Hallux valgus with bunion deformity noted b/l lower extremities. Hammertoe(s) noted to the 2-5 bilaterally.  Neurological:  Protective sensation intact 5/5 intact bilaterally with 10g monofilament b/l. Vibratory sensation diminished b/l.  Assessment and Plan:  1. Pain due to onychomycosis of toenails of both feet   2.  Onycholysis   3. Diabetic peripheral neuropathy associated with type 2 diabetes mellitus ([place])     -Examined patient. -Continue diabetic foot care principles. -Patient to continue soft, supportive shoe gear daily. -Left great toe nailplate gently debrided from it's remaining attachment to digit. Nailbed cleansed with alcohol. Triple antibiotic ointment applied. No further treatment required.  -Toenails 2-5 bilaterally and R hallux debrided in length and girth without iatrogenic bleeding with sterile nail nipper and dremel.  -Patient to report any pedal injuries to medical professional immediately. -Patient/POA to call should there be question/concern in the interim.  Return in about 3 months (around 07/25/2021).  Marzetta Board, DPM

## 2021-05-05 ENCOUNTER — Other Ambulatory Visit (HOSPITAL_BASED_OUTPATIENT_CLINIC_OR_DEPARTMENT_OTHER): Payer: Self-pay

## 2021-05-05 DIAGNOSIS — G4733 Obstructive sleep apnea (adult) (pediatric): Secondary | ICD-10-CM

## 2021-05-05 DIAGNOSIS — G4734 Idiopathic sleep related nonobstructive alveolar hypoventilation: Secondary | ICD-10-CM

## 2021-05-11 DIAGNOSIS — N184 Chronic kidney disease, stage 4 (severe): Secondary | ICD-10-CM | POA: Diagnosis not present

## 2021-05-11 DIAGNOSIS — I129 Hypertensive chronic kidney disease with stage 1 through stage 4 chronic kidney disease, or unspecified chronic kidney disease: Secondary | ICD-10-CM | POA: Diagnosis not present

## 2021-05-11 DIAGNOSIS — E1129 Type 2 diabetes mellitus with other diabetic kidney complication: Secondary | ICD-10-CM | POA: Diagnosis not present

## 2021-05-11 DIAGNOSIS — Z4681 Encounter for fitting and adjustment of insulin pump: Secondary | ICD-10-CM | POA: Diagnosis not present

## 2021-05-11 DIAGNOSIS — Z794 Long term (current) use of insulin: Secondary | ICD-10-CM | POA: Diagnosis not present

## 2021-05-23 ENCOUNTER — Telehealth: Payer: Self-pay

## 2021-05-23 NOTE — Telephone Encounter (Signed)
Patient's wife calls today to report that patient's left arm (AVF 2018) is hot and very swollen. I advised she had an appointment on Monday - she says he can't wait that long. To include labs, patient would need to be seen on Thursday. Wife says, "if he's still alive by then." She is very concerned and would like him to be seen ASAP. Told her I would leave his appointment for labs on Monday and scheduled a follow up tomorrow.

## 2021-05-24 ENCOUNTER — Ambulatory Visit: Payer: Medicare Other

## 2021-05-24 ENCOUNTER — Other Ambulatory Visit: Payer: Self-pay | Admitting: *Deleted

## 2021-05-24 DIAGNOSIS — N184 Chronic kidney disease, stage 4 (severe): Secondary | ICD-10-CM

## 2021-05-29 ENCOUNTER — Ambulatory Visit (HOSPITAL_COMMUNITY)
Admission: RE | Admit: 2021-05-29 | Discharge: 2021-05-29 | Disposition: A | Discharge status: Home / Self Care | Payer: Medicare Other | Source: Ambulatory Visit | Attending: Surgery | Admitting: Surgery

## 2021-05-29 ENCOUNTER — Other Ambulatory Visit: Payer: Self-pay

## 2021-05-29 ENCOUNTER — Ambulatory Visit (INDEPENDENT_AMBULATORY_CARE_PROVIDER_SITE_OTHER)
Admission: RE | Admit: 2021-05-29 | Discharge: 2021-05-29 | Disposition: A | Discharge status: Home / Self Care | Payer: Medicare Other | Source: Ambulatory Visit | Attending: Surgery | Admitting: Surgery

## 2021-05-29 ENCOUNTER — Encounter: Payer: Self-pay | Admitting: Surgery

## 2021-05-29 ENCOUNTER — Ambulatory Visit (INDEPENDENT_AMBULATORY_CARE_PROVIDER_SITE_OTHER): Payer: Medicare Other | Admitting: Surgery

## 2021-05-29 VITALS — BP 199/90 | HR 68 | Temp 97.9°F | Resp 20 | Ht 73.0 in | Wt 267.0 lb

## 2021-05-29 DIAGNOSIS — N184 Chronic kidney disease, stage 4 (severe): Secondary | ICD-10-CM

## 2021-05-29 NOTE — Progress Notes (Signed)
[facility] [place]  Patient name: Daniel Ochoa MRN: [id number] DOB: July 05, 1941 Sex: male   REQUESTING PROVIDER:    Dr. Phillis Knack   REASON FOR CONSULT:    Dialysis access  HISTORY OF PRESENT ILLNESS:   Daniel Ochoa is a 80 y.o. male, who is status post second stage right basilic vein fistula creation on 01/05/2021.  It was noted in the operating room that the majority the vein was greater than 1 cm however there was approximately a 6 cm long section of the vein in its midportion where it tapered down.  At this level the vein did easily take a #5 dilator.  Since that time he has been having swelling in his left arm.  He states that Dr. Augustin Coupe had gotten rid of the fistula so that he could no longer function.  The arm bothers him so much, he is having difficulty picking things up and even thought about going t to the emergency department  PAST MEDICAL HISTORY    Past Medical History:  Diagnosis Date   Acute deep vein thrombosis (DVT) of tibial vein of right lower extremity ([place]) 04/13/2014   Acute pulmonary embolism ([place]) 04/15/2014   Arthritis    Chronic fatigue    Chronic kidney disease, stage IV (severe) ([facility])    Complication of anesthesia    one surgery took 4 people to hold him down 2000   Constipation    Diabetic peripheral neuropathy ([place]) 10/26/2015   DM (diabetes mellitus), type 2, uncontrolled, with renal complications ([place])     Kidney   DVT (deep venous thrombosis) ([place])    RLE DVT with intermediate risk VQ scan for PE 03/2014   Dyspnea    Essential hypertension    Fibromyalgia    Hypothyroidism    Hypoxia 04/13/2014   Neuropathy    Obesity    OSA on CPAP    Pulmonary embolism ([place]) 04/13/2014   VQ 04/13/14 intermediate assoc with R DVT > on coumadin since Echo 04/14/14 > PA peak pressure: 89 mm Hg (S).      FAMILY HISTORY   Family History  Problem Relation Age of Onset   Cancer Father      SOCIAL HISTORY:   Social History   Socioeconomic History   Marital status: Married    Spouse name: Not on file   Number of children: 1   Years of education: Not on file   Highest education level: Not on file  Occupational History   Not on file  Tobacco Use   Smoking status: Former    Types: Pipe   Smokeless tobacco: Never   Tobacco comments:    quit in 1993  Vaping Use   Vaping Use: Never used  Substance and Sexual Activity   Alcohol use: No   Drug use: No   Sexual activity: Not on file  Other Topics Concern   Not on file  Social History Narrative   Not on file   Social Determinants of Health   Financial Resource Strain: Not on file  Food Insecurity: Not on file  Transportation Needs: Not on file  Physical Activity: Not on file  Stress: Not on file  Social Connections: Not on file  Intimate Partner Violence: Not on file    ALLERGIES:    Allergies  Allergen Reactions   Statins Other (See Comments)    Elevated CK level   Latex Rash    Severe blistery rash    CURRENT MEDICATIONS:  Current Outpatient Medications  Medication Sig Dispense Refill   Accu-Chek Softclix Lancets lancets USE TO CHECK BLOOD SUGAR UP TO TID E11.29     amLODipine (NORVASC) 5 MG tablet Take 5 mg by mouth daily.      amoxicillin-clavulanate (AUGMENTIN) 250-125 MG tablet Take 1 tablet by mouth 2 (two) times daily.     atenolol (TENORMIN) 50 MG tablet Take 50 mg by mouth 2 (two) times daily.      Calcium-Vitamin D-Vitamin K ([employer] CALCIUM SOFT CHEWS) 650-12.5-40 MG-MCG-MCG CHEW Chew 1 tablet by mouth 2 (two) times daily.     carboxymethylcellulose (REFRESH PLUS) 0.5 % SOLN Place 1 drop into both eyes 3 (three) times daily as needed (allergies).     cephALEXin (KEFLEX) 500 MG capsule Take 500 mg by mouth 2 (two) times daily.     clonazePAM (KLONOPIN) 1 MG tablet Take 1 mg by mouth at bedtime.      clonazePAM (KLONOPIN) 1 MG tablet Take 1 tablet by mouth daily as needed.     DYMISTA  137-50 MCG/ACT SUSP Place 1 spray into both nostrils 2 (two) times daily as needed (congestion).     famotidine (PEPCID) 40 MG tablet Take 40 mg by mouth at bedtime.      FREESTYLE TEST STRIPS test strip USE AS DIRECTED TO TEST BLOOD SUGAR 5 TIMES PER DAY. DX: E11.65  11   furosemide (LASIX) 40 MG tablet Take 40 mg by mouth See admin instructions. Take 40 mg twice daily, may take a third 40 mg dose as needed for swelling     HUMALOG 100 UNIT/ML injection Inject into the skin daily.     HYDROcodone-acetaminophen (NORCO/VICODIN) 5-325 MG tablet Take 1 tablet by mouth 3 (three) times daily.  0   ipratropium (ATROVENT) 0.03 % nasal spray Place 2 sprays into both nostrils daily as needed for rhinitis.     Lancets Misc. (ACCU-CHEK FASTCLIX LANCET) KIT USE TO CHECK BLOOD SUGAR UP TO TID E11.29     OVER THE COUNTER MEDICATION Apply 1 application topically daily as needed (pain). Hempvana Cream     oxyCODONE-acetaminophen (PERCOCET) 10-325 MG tablet Take 1 tablet by mouth every 6 (six) hours as needed for pain. 8 tablet 0   SYNTHROID 200 MCG tablet Take 200-300 mcg by mouth See admin instructions. Takes the 200 mcg daily except on Sundays take 300 mcg (or 1.5 tablets)     vitamin C (ASCORBIC ACID) 250 MG tablet Take 250-500 mg by mouth See admin instructions. Take 250 mg in the morning and 500 mg at night     No current facility-administered medications for this visit.    REVIEW OF SYSTEMS:   [X] denotes positive finding, [ ] denotes negative finding Cardiac  Comments:  Chest pain or chest pressure:    Shortness of breath upon exertion:    Short of breath when lying flat:    Irregular heart rhythm:        Vascular    Pain in calf, thigh, or hip brought on by ambulation:    Pain in feet at night that wakes you up from your sleep:     Blood clot in your veins:    Leg swelling:         Pulmonary    Oxygen at home:    Productive cough:     Wheezing:         Neurologic    Sudden weakness in  arms or legs:     Sudden numbness  in arms or legs:     Sudden onset of difficulty speaking or slurred speech:    Temporary loss of vision in one eye:     Problems with dizziness:         Gastrointestinal    Blood in stool:      Vomited blood:         Genitourinary    Burning when urinating:     Blood in urine:        Psychiatric    Major depression:         Hematologic    Bleeding problems:    Problems with blood clotting too easily:        Skin    Rashes or ulcers:        Constitutional    Fever or chills:     PHYSICAL EXAM:   Vitals:   05/29/21 1229  BP: (!) 199/90  Pulse: 68  Resp: 20  Temp: 97.9 F (36.6 C)  SpO2: 96%  Weight: 121.1 kg  Height: 6' 1" (1.854 m)    GENERAL: The patient is a well-nourished male, in no acute distress. The vital signs are documented above. CARDIAC: There is a regular rate and rhythm.  VASCULAR: Palpable thrill/pulse within the right basilic vein fistula.  Palpable thrill/pulse within left arm fistula with multiple prominent venous collaterals and significant edema to the left arm PULMONARY: Nonlabored respirations MUSCULOSKELETAL: There are no major deformities or cyanosis. NEUROLOGIC: No focal weakness or paresthesias are detected. PSYCHIATRIC: The patient has a normal affect.  STUDIES:   I have reviewed the following: +-----------------+-------------+----------+--------+  Right Cephalic   Diameter (cm)Depth (cm)Findings  +-----------------+-------------+----------+--------+  Prox upper arm       0.37                         +-----------------+-------------+----------+--------+  Mid upper arm        0.41               thrombus  +-----------------+-------------+----------+--------+  Dist upper arm       0.47               thrombus  +-----------------+-------------+----------+--------+  Antecubital fossa    0.41                joins    +-----------------+-------------+----------+--------+  Prox  forearm         0.43                         +-----------------+-------------+----------+--------+  Mid forearm          0.37                         +-----------------+-------------+----------+--------+  Dist forearm         0.33                         +-----------------+-------------+----------+--------+   +--------------+-------------+----------+--------+  Right Basilic Diameter (cm)Depth (cm)Findings  +--------------+-------------+----------+--------+  Prox upper arm    0.30                 AVF     +--------------+-------------+----------+--------+  Mid upper arm     0.78                 AVF     +--------------+-------------+----------+--------+  Dist upper arm  thrombus  +--------------+-------------+----------+--------+  Prox forearm                         thrombus  +--------------+-------------+----------+--------+   +-----------------+-------------+----------+--------+  Left Cephalic    Diameter (cm)Depth (cm)Findings  +-----------------+-------------+----------+--------+  Mid upper arm                             AVF     +-----------------+-------------+----------+--------+  Dist upper arm                            AVF     +-----------------+-------------+----------+--------+  Antecubital fossa                         AVF     +-----------------+-------------+----------+--------+  Prox forearm         0.58                         +-----------------+-------------+----------+--------+  Mid forearm          0.53                         +-----------------+-------------+----------+--------+  Dist forearm         0.30                         +-----------------+-------------+----------+--------+   +-----------------+-------------+----------+--------------+  Left Basilic     Diameter (cm)Depth (cm)   Findings     +-----------------+-------------+----------+--------------+   Shoulder                                not visualized  +-----------------+-------------+----------+--------------+  Prox upper arm                          not visualized  +-----------------+-------------+----------+--------------+  Mid upper arm        0.74                               +-----------------+-------------+----------+--------------+  Dist upper arm       0.37                               +-----------------+-------------+----------+--------------+  Antecubital fossa    0.33                               +-----------------+-------------+----------+--------------+  Prox forearm         0.58                               +-----------------+-------------+----------+--------------+  ASSESSMENT and PLAN   Left arm swelling: I evaluated the patient's arterial venous anastomosis with SonoSite and this appears to be widely patent.  I suspect he has a central vein occlusion because the fistula in the arm is very pulsatile and there are multiple prominent collaterals.  I suspect this is the reason for his left arm edema.  I proposed going to the operating room and ligating his fistula which should  significantly improve his symptoms.  They are in full agreement and will get this done as soon as possible.  Right arm fistula: At the time of his operation there was narrowing within the midportion of his basilic vein fistula for approximately 6 cm.  This area did take a 5 dilator.  Because he is not yet on dialysis, I have elected not to replace this segment with a piece of Gore-Tex.  I think the best option is to see how he does should he require dialysis.  If he has difficulty, a fistulogram could be performed.  He would then likely need replacement of the midportion with Gore-Tex.   Leia Alf, MD, FACS [facility] [phone] Pager [phone]

## 2021-05-29 NOTE — H&P (View-Only) (Signed)
[facility] [place]  Patient name: Daniel Ochoa MRN: [id number] DOB: [birth year] Sex: male   REQUESTING PROVIDER:    Dr. Phillis Knack   REASON FOR CONSULT:    Dialysis access  HISTORY OF PRESENT ILLNESS:   Daniel Ochoa is a 80 y.o. male, who is status post second stage right basilic vein fistula creation on 01/05/2021.  It was noted in the operating room that the majority the vein was greater than 1 cm however there was approximately a 6 cm long section of the vein in its midportion where it tapered down.  At this level the vein did easily take a #5 dilator.  Since that time he has been having swelling in his left arm.  He states that Dr. Augustin Coupe had gotten rid of the fistula so that he could no longer function.  The arm bothers him so much, he is having difficulty picking things up and even thought about going t to the emergency department  PAST MEDICAL HISTORY    Past Medical History:  Diagnosis Date   Acute deep vein thrombosis (DVT) of tibial vein of right lower extremity ([place]) 04/13/2014   Acute pulmonary embolism ([place]) 04/15/2014   Arthritis    Chronic fatigue    Chronic kidney disease, stage IV (severe) (HCC)    Complication of anesthesia    one surgery took 4 people to hold him down 2000   Constipation    Diabetic peripheral neuropathy ([place]) 10/26/2015   DM (diabetes mellitus), type 2, uncontrolled, with renal complications ([place])    [facility]   DVT (deep venous thrombosis) ([place])    RLE DVT with intermediate risk VQ scan for PE 03/2014   Dyspnea    Essential hypertension    Fibromyalgia    Hypothyroidism    Hypoxia 04/13/2014   Neuropathy    Obesity    OSA on CPAP    Pulmonary embolism ([place]) 04/13/2014   VQ 04/13/14 intermediate assoc with R DVT > on coumadin since Echo 04/14/14 > PA peak pressure: 89 mm Hg (S).      FAMILY HISTORY   Family History  Problem Relation Age of Onset   Cancer Father      SOCIAL HISTORY:   Social History   Socioeconomic History   Marital status: Married    Spouse name: Not on file   Number of children: 1   Years of education: Not on file   Highest education level: Not on file  Occupational History   Not on file  Tobacco Use   Smoking status: Former    Types: Pipe   Smokeless tobacco: Never   Tobacco comments:    quit in 1993  Vaping Use   Vaping Use: Never used  Substance and Sexual Activity   Alcohol use: No   Drug use: No   Sexual activity: Not on file  Other Topics Concern   Not on file  Social History Narrative   Not on file   Social Determinants of Health   Financial Resource Strain: Not on file  Food Insecurity: Not on file  Transportation Needs: Not on file  Physical Activity: Not on file  Stress: Not on file  Social Connections: Not on file  Intimate Partner Violence: Not on file    ALLERGIES:    Allergies  Allergen Reactions   Statins Other (See Comments)    Elevated CK level   Latex Rash    Severe blistery rash    CURRENT MEDICATIONS:  Current Outpatient Medications  Medication Sig Dispense Refill   Accu-Chek Softclix Lancets lancets USE TO CHECK BLOOD SUGAR UP TO TID E11.29     amLODipine (NORVASC) 5 MG tablet Take 5 mg by mouth daily.      amoxicillin-clavulanate (AUGMENTIN) 250-125 MG tablet Take 1 tablet by mouth 2 (two) times daily.     atenolol (TENORMIN) 50 MG tablet Take 50 mg by mouth 2 (two) times daily.      Calcium-Vitamin D-Vitamin K ([employer] CALCIUM SOFT CHEWS) 650-12.5-40 MG-MCG-MCG CHEW Chew 1 tablet by mouth 2 (two) times daily.     carboxymethylcellulose (REFRESH PLUS) 0.5 % SOLN Place 1 drop into both eyes 3 (three) times daily as needed (allergies).     cephALEXin (KEFLEX) 500 MG capsule Take 500 mg by mouth 2 (two) times daily.     clonazePAM (KLONOPIN) 1 MG tablet Take 1 mg by mouth at bedtime.      clonazePAM (KLONOPIN) 1 MG tablet Take 1 tablet by mouth daily as needed.     DYMISTA  137-50 MCG/ACT SUSP Place 1 spray into both nostrils 2 (two) times daily as needed (congestion).     famotidine (PEPCID) 40 MG tablet Take 40 mg by mouth at bedtime.      FREESTYLE TEST STRIPS test strip USE AS DIRECTED TO TEST BLOOD SUGAR 5 TIMES PER DAY. DX: E11.65  11   furosemide (LASIX) 40 MG tablet Take 40 mg by mouth See admin instructions. Take 40 mg twice daily, may take a third 40 mg dose as needed for swelling     HUMALOG 100 UNIT/ML injection Inject into the skin daily.     HYDROcodone-acetaminophen (NORCO/VICODIN) 5-325 MG tablet Take 1 tablet by mouth 3 (three) times daily.  0   ipratropium (ATROVENT) 0.03 % nasal spray Place 2 sprays into both nostrils daily as needed for rhinitis.     Lancets Misc. (ACCU-CHEK FASTCLIX LANCET) KIT USE TO CHECK BLOOD SUGAR UP TO TID E11.29     OVER THE COUNTER MEDICATION Apply 1 application topically daily as needed (pain). Hempvana Cream     oxyCODONE-acetaminophen (PERCOCET) 10-325 MG tablet Take 1 tablet by mouth every 6 (six) hours as needed for pain. 8 tablet 0   SYNTHROID 200 MCG tablet Take 200-300 mcg by mouth See admin instructions. Takes the 200 mcg daily except on Sundays take 300 mcg (or 1.5 tablets)     vitamin C (ASCORBIC ACID) 250 MG tablet Take 250-500 mg by mouth See admin instructions. Take 250 mg in the morning and 500 mg at night     No current facility-administered medications for this visit.    REVIEW OF SYSTEMS:   [X] denotes positive finding, [ ] denotes negative finding Cardiac  Comments:  Chest pain or chest pressure:    Shortness of breath upon exertion:    Short of breath when lying flat:    Irregular heart rhythm:        Vascular    Pain in calf, thigh, or hip brought on by ambulation:    Pain in feet at night that wakes you up from your sleep:     Blood clot in your veins:    Leg swelling:         Pulmonary    Oxygen at home:    Productive cough:     Wheezing:         Neurologic    Sudden weakness in  arms or legs:     Sudden numbness  in arms or legs:     Sudden onset of difficulty speaking or slurred speech:    Temporary loss of vision in one eye:     Problems with dizziness:         Gastrointestinal    Blood in stool:      Vomited blood:         Genitourinary    Burning when urinating:     Blood in urine:        Psychiatric    Major depression:         Hematologic    Bleeding problems:    Problems with blood clotting too easily:        Skin    Rashes or ulcers:        Constitutional    Fever or chills:     PHYSICAL EXAM:   Vitals:   05/29/21 1229  BP: (!) 199/90  Pulse: 68  Resp: 20  Temp: 97.9 F (36.6 C)  SpO2: 96%  Weight: 121.1 kg  Height: 6' 1" (1.854 m)    GENERAL: The patient is a well-nourished male, in no acute distress. The vital signs are documented above. CARDIAC: There is a regular rate and rhythm.  VASCULAR: Palpable thrill/pulse within the right basilic vein fistula.  Palpable thrill/pulse within left arm fistula with multiple prominent venous collaterals and significant edema to the left arm PULMONARY: Nonlabored respirations MUSCULOSKELETAL: There are no major deformities or cyanosis. NEUROLOGIC: No focal weakness or paresthesias are detected. PSYCHIATRIC: The patient has a normal affect.  STUDIES:   I have reviewed the following: +-----------------+-------------+----------+--------+  Right Cephalic   Diameter (cm)Depth (cm)Findings  +-----------------+-------------+----------+--------+  Prox upper arm       0.37                         +-----------------+-------------+----------+--------+  Mid upper arm        0.41               thrombus  +-----------------+-------------+----------+--------+  Dist upper arm       0.47               thrombus  +-----------------+-------------+----------+--------+  Antecubital fossa    0.41                joins    +-----------------+-------------+----------+--------+  Prox  forearm         0.43                         +-----------------+-------------+----------+--------+  Mid forearm          0.37                         +-----------------+-------------+----------+--------+  Dist forearm         0.33                         +-----------------+-------------+----------+--------+   +--------------+-------------+----------+--------+  Right Basilic Diameter (cm)Depth (cm)Findings  +--------------+-------------+----------+--------+  Prox upper arm    0.30                 AVF     +--------------+-------------+----------+--------+  Mid upper arm     0.78                 AVF     +--------------+-------------+----------+--------+  Dist upper arm  thrombus  +--------------+-------------+----------+--------+  Prox forearm                         thrombus  +--------------+-------------+----------+--------+   +-----------------+-------------+----------+--------+  Left Cephalic    Diameter (cm)Depth (cm)Findings  +-----------------+-------------+----------+--------+  Mid upper arm                             AVF     +-----------------+-------------+----------+--------+  Dist upper arm                            AVF     +-----------------+-------------+----------+--------+  Antecubital fossa                         AVF     +-----------------+-------------+----------+--------+  Prox forearm         0.58                         +-----------------+-------------+----------+--------+  Mid forearm          0.53                         +-----------------+-------------+----------+--------+  Dist forearm         0.30                         +-----------------+-------------+----------+--------+   +-----------------+-------------+----------+--------------+  Left Basilic     Diameter (cm)Depth (cm)   Findings     +-----------------+-------------+----------+--------------+   Shoulder                                not visualized  +-----------------+-------------+----------+--------------+  Prox upper arm                          not visualized  +-----------------+-------------+----------+--------------+  Mid upper arm        0.74                               +-----------------+-------------+----------+--------------+  Dist upper arm       0.37                               +-----------------+-------------+----------+--------------+  Antecubital fossa    0.33                               +-----------------+-------------+----------+--------------+  Prox forearm         0.58                               +-----------------+-------------+----------+--------------+  ASSESSMENT and PLAN   Left arm swelling: I evaluated the patient's arterial venous anastomosis with SonoSite and this appears to be widely patent.  I suspect he has a central vein occlusion because the fistula in the arm is very pulsatile and there are multiple prominent collaterals.  I suspect this is the reason for his left arm edema.  I proposed going to the operating room and ligating his fistula which should  significantly improve his symptoms.  They are in full agreement and will get this done as soon as possible.  Right arm fistula: At the time of his operation there was narrowing within the midportion of his basilic vein fistula for approximately 6 cm.  This area did take a 5 dilator.  Because he is not yet on dialysis, I have elected not to replace this segment with a piece of Gore-Tex.  I think the best option is to see how he does should he require dialysis.  If he has difficulty, a fistulogram could be performed.  He would then likely need replacement of the midportion with Gore-Tex.   Leia Alf, MD, FACS [facility] [phone] Pager [phone]

## 2021-05-30 ENCOUNTER — Other Ambulatory Visit: Payer: Self-pay

## 2021-05-30 ENCOUNTER — Encounter (HOSPITAL_COMMUNITY): Payer: Self-pay | Admitting: Surgery

## 2021-05-30 NOTE — Progress Notes (Signed)
Pt. Wife instructed to contact M.D. responsible for insulin pump to see what instructions are for surgery for insulin pump on 06/01/2021

## 2021-05-31 NOTE — Anesthesia Preprocedure Evaluation (Addendum)
Anesthesia Evaluation  Patient identified by MRN, date of birth, ID band Patient awake    Reviewed: Allergy & Precautions, NPO status , Patient's Chart, lab work & pertinent test results  Airway Mallampati: II  TM Distance: >3 FB Neck ROM: Full    Dental no notable dental hx.    Pulmonary sleep apnea , former smoker,    Pulmonary exam normal  + decreased breath sounds      Cardiovascular hypertension, Normal cardiovascular exam+ dysrhythmias (paroxymas afib) Atrial Fibrillation  Rhythm:Regular Rate:Normal  H/o dVT/PE   ECHO 2021 1. Left ventricular ejection fraction, by estimation, is 60 to 65%. The  left ventricle has normal function. The left ventricle has no regional  wall motion abnormalities. There is mild left ventricular hypertrophy.  Left ventricular diastolic parameters  are indeterminate.  2. Right ventricular systolic function is normal. The right ventricular  size is normal.  3. Left atrial size was mildly dilated.  4. The mitral valve is degenerative. Trivial mitral valve regurgitation.  No evidence of mitral stenosis.  5. The aortic valve was not well visualized. Aortic valve regurgitation  is not visualized. Mild to moderate aortic valve sclerosis/calcification  is present, without any evidence of aortic stenosis.  6. The inferior vena cava is normal in size with greater than 50%  respiratory variability, suggesting right atrial pressure of 3 mmHg.    Neuro/Psych negative neurological ROS  negative psych ROS   GI/Hepatic negative GI ROS, Neg liver ROS,   Endo/Other  diabetes, Poorly Controlled, Type 2Hypothyroidism obesity  Renal/GU CRFRenal disease (not yet on dialysis)  negative genitourinary   Musculoskeletal  (+) Arthritis , Osteoarthritis,  Fibromyalgia -  Abdominal   Peds negative pediatric ROS (+)  Hematology  (+) anemia ,   Anesthesia Other Findings    Reproductive/Obstetrics negative OB ROS                            Anesthesia Physical Anesthesia Plan  ASA: 4  Anesthesia Plan: MAC   Post-op Pain Management:    Induction: Intravenous  PONV Risk Score and Plan: 1 and Treatment may vary due to age or medical condition  Airway Management Planned: Natural Airway and Simple Face Mask  Additional Equipment:   Intra-op Plan:   Post-operative Plan:   Informed Consent: I have reviewed the patients History and Physical, chart, labs and discussed the procedure including the risks, benefits and alternatives for the proposed anesthesia with the patient or authorized representative who has indicated his/her understanding and acceptance.     Dental advisory given  Plan Discussed with: CRNA, Anesthesiologist and Surgeon  Anesthesia Plan Comments: (Local by surgeon. MAC. GA/LMA as backup plan. Norton Blizzard, MD    BS in the 50s this AM. Patient asymptomatic. Will give 1/2 amp D50. And recheck. Will d/c basal rate of insulin. Norton Blizzard, MD  )      Anesthesia Quick Evaluation

## 2021-06-01 ENCOUNTER — Ambulatory Visit (HOSPITAL_COMMUNITY)
Admission: RE | Admit: 2021-06-01 | Discharge: 2021-06-01 | Disposition: A | Discharge status: Home / Self Care | Payer: Medicare Other | Attending: Surgery | Admitting: Surgery

## 2021-06-01 ENCOUNTER — Ambulatory Visit (HOSPITAL_COMMUNITY): Payer: Medicare Other | Admitting: Physician Assistant

## 2021-06-01 ENCOUNTER — Encounter (HOSPITAL_COMMUNITY): Payer: Self-pay | Admitting: Surgery

## 2021-06-01 ENCOUNTER — Encounter (HOSPITAL_COMMUNITY)
Admission: RE | Disposition: A | Discharge status: Home / Self Care | Payer: Self-pay | Source: Home / Self Care | Attending: Surgery

## 2021-06-01 DIAGNOSIS — Z87891 Personal history of nicotine dependence: Secondary | ICD-10-CM | POA: Insufficient documentation

## 2021-06-01 DIAGNOSIS — R600 Localized edema: Secondary | ICD-10-CM | POA: Diagnosis not present

## 2021-06-01 DIAGNOSIS — N186 End stage renal disease: Secondary | ICD-10-CM | POA: Diagnosis not present

## 2021-06-01 DIAGNOSIS — Z794 Long term (current) use of insulin: Secondary | ICD-10-CM | POA: Diagnosis not present

## 2021-06-01 DIAGNOSIS — M7989 Other specified soft tissue disorders: Secondary | ICD-10-CM | POA: Diagnosis not present

## 2021-06-01 DIAGNOSIS — Z91040 Latex allergy status: Secondary | ICD-10-CM | POA: Insufficient documentation

## 2021-06-01 DIAGNOSIS — Z79891 Long term (current) use of opiate analgesic: Secondary | ICD-10-CM | POA: Diagnosis not present

## 2021-06-01 DIAGNOSIS — E1122 Type 2 diabetes mellitus with diabetic chronic kidney disease: Secondary | ICD-10-CM | POA: Diagnosis not present

## 2021-06-01 DIAGNOSIS — N184 Chronic kidney disease, stage 4 (severe): Secondary | ICD-10-CM | POA: Diagnosis not present

## 2021-06-01 DIAGNOSIS — Z888 Allergy status to other drugs, medicaments and biological substances status: Secondary | ICD-10-CM | POA: Diagnosis not present

## 2021-06-01 DIAGNOSIS — I120 Hypertensive chronic kidney disease with stage 5 chronic kidney disease or end stage renal disease: Secondary | ICD-10-CM | POA: Insufficient documentation

## 2021-06-01 DIAGNOSIS — Z79890 Hormone replacement therapy (postmenopausal): Secondary | ICD-10-CM | POA: Insufficient documentation

## 2021-06-01 DIAGNOSIS — R609 Edema, unspecified: Secondary | ICD-10-CM | POA: Insufficient documentation

## 2021-06-01 DIAGNOSIS — E1140 Type 2 diabetes mellitus with diabetic neuropathy, unspecified: Secondary | ICD-10-CM | POA: Insufficient documentation

## 2021-06-01 DIAGNOSIS — I129 Hypertensive chronic kidney disease with stage 1 through stage 4 chronic kidney disease, or unspecified chronic kidney disease: Secondary | ICD-10-CM | POA: Diagnosis not present

## 2021-06-01 DIAGNOSIS — T82898A Other specified complication of vascular prosthetic devices, implants and grafts, initial encounter: Secondary | ICD-10-CM | POA: Diagnosis not present

## 2021-06-01 HISTORY — PX: LIGATION OF ARTERIOVENOUS  FISTULA: SHX5948

## 2021-06-01 LAB — POCT I-STAT, CHEM 8
BUN: 78 mg/dL — ABNORMAL HIGH (ref 8–23)
Calcium, Ion: 1.23 mmol/L (ref 1.15–1.40)
Chloride: 98 mmol/L (ref 98–111)
Creatinine, Ser: 4.2 mg/dL — ABNORMAL HIGH (ref 0.61–1.24)
Glucose, Bld: 55 mg/dL — ABNORMAL LOW (ref 70–99)
HCT: 38 % — ABNORMAL LOW (ref 39.0–52.0)
Hemoglobin: 12.9 g/dL — ABNORMAL LOW (ref 13.0–17.0)
Potassium: 3.8 mmol/L (ref 3.5–5.1)
Sodium: 141 mmol/L (ref 135–145)
TCO2: 32 mmol/L (ref 22–32)

## 2021-06-01 LAB — GLUCOSE, CAPILLARY
Glucose-Capillary: 57 mg/dL — ABNORMAL LOW (ref 70–99)
Glucose-Capillary: 95 mg/dL (ref 70–99)
Glucose-Capillary: 54 mg/dL — ABNORMAL LOW (ref 70–99)
Glucose-Capillary: 56 mg/dL — ABNORMAL LOW (ref 70–99)
Glucose-Capillary: 77 mg/dL (ref 70–99)
Glucose-Capillary: 120 mg/dL — ABNORMAL HIGH (ref 70–99)

## 2021-06-01 SURGERY — LIGATION OF ARTERIOVENOUS  FISTULA
Anesthesia: Monitor Anesthesia Care | Laterality: Left

## 2021-06-01 MED ORDER — ONDANSETRON HCL 4 MG/2ML IJ SOLN
INTRAMUSCULAR | Status: DC | PRN
  Administered 2021-06-01: 4 mg via INTRAVENOUS

## 2021-06-01 MED ORDER — LIDOCAINE 2% (20 MG/ML) 5 ML SYRINGE
INTRAMUSCULAR | Status: DC | PRN
  Administered 2021-06-01: 80 mg via INTRAVENOUS

## 2021-06-01 MED ORDER — SODIUM CHLORIDE 0.9 % IV SOLN: INTRAVENOUS | Status: DC

## 2021-06-01 MED ORDER — DEXTROSE 50 % IV SOLN
INTRAVENOUS | Status: AC
  Administered 2021-06-01: 12.5 g via INTRAVENOUS
  Filled 2021-06-01: qty 50

## 2021-06-01 MED ORDER — CEFAZOLIN IN SODIUM CHLORIDE 3-0.9 GM/100ML-% IV SOLN
3.0000 g | INTRAVENOUS | Status: AC
  Administered 2021-06-01: 3 g via INTRAVENOUS
  Filled 2021-06-01: qty 100

## 2021-06-01 MED ORDER — OXYCODONE HCL 5 MG PO TABS: 5.0000 mg | ORAL_TABLET | Freq: Once | ORAL | Status: DC | PRN

## 2021-06-01 MED ORDER — CHLORHEXIDINE GLUCONATE 4 % EX LIQD: 60.0000 mL | Freq: Once | CUTANEOUS | Status: DC

## 2021-06-01 MED ORDER — DEXTROSE 50 % IV SOLN
12.5000 g | INTRAVENOUS | Status: AC
  Filled 2021-06-01: qty 50

## 2021-06-01 MED ORDER — LIDOCAINE HCL (CARDIAC) PF 100 MG/5ML IV SOSY: PREFILLED_SYRINGE | INTRAVENOUS | Status: DC | PRN

## 2021-06-01 MED ORDER — LIDOCAINE-EPINEPHRINE (PF) 1 %-1:200000 IJ SOLN
INTRAMUSCULAR | Status: AC
  Filled 2021-06-01: qty 30

## 2021-06-01 MED ORDER — FENTANYL CITRATE (PF) 100 MCG/2ML IJ SOLN
INTRAMUSCULAR | Status: DC | PRN
  Administered 2021-06-01: 50 ug via INTRAVENOUS

## 2021-06-01 MED ORDER — FENTANYL CITRATE (PF) 100 MCG/2ML IJ SOLN: 25.0000 ug | INTRAMUSCULAR | Status: DC | PRN

## 2021-06-01 MED ORDER — PROPOFOL 10 MG/ML IV BOLUS
INTRAVENOUS | Status: AC
  Filled 2021-06-01: qty 20

## 2021-06-01 MED ORDER — OXYCODONE HCL 5 MG/5ML PO SOLN: 5.0000 mg | Freq: Once | ORAL | Status: DC | PRN

## 2021-06-01 MED ORDER — CHLORHEXIDINE GLUCONATE 0.12 % MT SOLN: 15.0000 mL | OROMUCOSAL | Status: AC

## 2021-06-01 MED ORDER — 0.9 % SODIUM CHLORIDE (POUR BTL) OPTIME
TOPICAL | Status: DC | PRN
  Administered 2021-06-01: 1000 mL

## 2021-06-01 MED ORDER — ONDANSETRON HCL 4 MG/2ML IJ SOLN: 4.0000 mg | Freq: Once | INTRAMUSCULAR | Status: DC | PRN

## 2021-06-01 MED ORDER — CHLORHEXIDINE GLUCONATE 0.12 % MT SOLN
OROMUCOSAL | Status: AC
  Administered 2021-06-01: 15 mL via OROMUCOSAL
  Filled 2021-06-01: qty 15

## 2021-06-01 MED ORDER — PROPOFOL 10 MG/ML IV BOLUS
INTRAVENOUS | Status: DC | PRN
  Administered 2021-06-01: 160 mg via INTRAVENOUS

## 2021-06-01 MED ORDER — FENTANYL CITRATE (PF) 250 MCG/5ML IJ SOLN
INTRAMUSCULAR | Status: AC
  Filled 2021-06-01: qty 5

## 2021-06-01 MED ORDER — LIDOCAINE-EPINEPHRINE (PF) 1 %-1:200000 IJ SOLN
INTRAMUSCULAR | Status: DC | PRN
  Administered 2021-06-01: 12 mL

## 2021-06-01 SURGICAL SUPPLY — 37 items
BAG COUNTER SPONGE SURGICOUNT (BAG) ×2 IMPLANT
BNDG COHESIVE 6X5 TAN NS LF (GAUZE/BANDAGES/DRESSINGS) ×2 IMPLANT
BNDG COHESIVE 6X5 TAN ST LF (GAUZE/BANDAGES/DRESSINGS) ×2 IMPLANT
BNDG ELASTIC 4X5.8 VLCR STR LF (GAUZE/BANDAGES/DRESSINGS) ×2 IMPLANT
BNDG ELASTIC 6X5.8 VLCR STR LF (GAUZE/BANDAGES/DRESSINGS) ×2 IMPLANT
BNDG GAUZE ELAST 4 BULKY (GAUZE/BANDAGES/DRESSINGS) ×2 IMPLANT
CANISTER SUCT 3000ML PPV (MISCELLANEOUS) ×2 IMPLANT
CLIP VESOCCLUDE MED 6/CT (CLIP) ×2 IMPLANT
CLIP VESOCCLUDE SM WIDE 6/CT (CLIP) ×2 IMPLANT
DERMABOND ADVANCED (GAUZE/BANDAGES/DRESSINGS) ×1
DERMABOND ADVANCED .7 DNX12 (GAUZE/BANDAGES/DRESSINGS) ×1 IMPLANT
ELECT REM PT RETURN 9FT ADLT (ELECTROSURGICAL) ×2
ELECTRODE REM PT RTRN 9FT ADLT (ELECTROSURGICAL) ×1 IMPLANT
GLOVE SRG 8 PF TXTR STRL LF DI (GLOVE) ×1 IMPLANT
GLOVE SURG POLYISO LF SZ7.5 (GLOVE) ×2 IMPLANT
GLOVE SURG UNDER POLY LF SZ8 (GLOVE) ×1
GOWN STRL REUS W/ TWL LRG LVL3 (GOWN DISPOSABLE) ×2 IMPLANT
GOWN STRL REUS W/ TWL XL LVL3 (GOWN DISPOSABLE) ×1 IMPLANT
GOWN STRL REUS W/TWL LRG LVL3 (GOWN DISPOSABLE) ×2
GOWN STRL REUS W/TWL XL LVL3 (GOWN DISPOSABLE) ×1
HEMOSTAT SNOW SURGICEL 2X4 (HEMOSTASIS) IMPLANT
KIT BASIN OR (CUSTOM PROCEDURE TRAY) ×2 IMPLANT
KIT TURNOVER KIT B (KITS) ×2 IMPLANT
NS IRRIG 1000ML POUR BTL (IV SOLUTION) ×2 IMPLANT
PACK CV ACCESS (CUSTOM PROCEDURE TRAY) ×2 IMPLANT
PAD ARMBOARD 7.5X6 YLW CONV (MISCELLANEOUS) ×4 IMPLANT
SUT ETHILON 3 0 PS 1 (SUTURE) ×2 IMPLANT
SUT PROLENE 5 0 C 1 24 (SUTURE) ×4 IMPLANT
SUT PROLENE 6 0 BV (SUTURE) IMPLANT
SUT PROLENE 6 0 CC (SUTURE) ×2 IMPLANT
SUT SILK 0 TIES 10X30 (SUTURE) ×2 IMPLANT
SUT VIC AB 3-0 SH 27 (SUTURE) ×1
SUT VIC AB 3-0 SH 27X BRD (SUTURE) ×1 IMPLANT
SUT VICRYL 4-0 PS2 18IN ABS (SUTURE) IMPLANT
TOWEL GREEN STERILE (TOWEL DISPOSABLE) ×2 IMPLANT
UNDERPAD 30X36 HEAVY ABSORB (UNDERPADS AND DIAPERS) ×2 IMPLANT
WATER STERILE IRR 1000ML POUR (IV SOLUTION) ×2 IMPLANT

## 2021-06-01 NOTE — Op Note (Signed)
    Patient name: Daniel Ochoa MRN: [id number] DOB: Feb 02, 1941 Sex: male  06/01/2021 Pre-operative Diagnosis: ESRD Post-operative diagnosis:  Same Surgeon:  [name] Assistants:  staff Procedure:   Ligation of left brachiocephalic fistula Anesthesia:  General Blood Loss:  minimal Specimens:  none   Indications: This is a 80 year old gentleman with end-stage renal disease who has left brachiocephalic fistula that is no longer being used.  He has profound edema in his left arm.  This fistula remains patent.  We discussed coming in for ligation for symptomatic relief.  Procedure:  The patient was identified in the holding area and taken to [facility]  The patient was then placed supine on the table. general anesthesia was administered.  The patient was prepped and draped in the usual sterile fashion.  A time out was called and antibiotics were administered.  A transverse incision was made over top of the fistula in the antecubital crease.  Using sharp and cautery dissection I circumferentially exposed the fistula down to the anastomosis to the artery.  The vein was approximately 15 mm in diameter.  Vascular clamps were placed on either side of the fistula and the fistula was divided.  Both ends were oversewn with running 5-0 Prolene in 2 layers.  After ligation, he continued to have a palpable radial pulse.  The wound was irrigated.  The incision was closed with 2 layers of Vicryl followed by Dermabond.  The arm was wrapped with Kerlix, Ace wrap, and Coban.  He was successfully extubated and taken recovery in stable condition.  There are no immediate complications.   Disposition: To PACU stable.   Theotis Burrow, M.D., [facility] [place] Office: [phone] Pager:  [phone]

## 2021-06-01 NOTE — Progress Notes (Addendum)
0845 Blood sugar 56 on arrival to PACU, Dr. Elgie Congo notified, states to drink some OJ and assess  0900 Blood sugar 54, patient given 2nd OJ and graham crackers with peanut butter  0915 Blood sugar 77  0943 Blood sugar 120, patient discharged from PACU.

## 2021-06-01 NOTE — Anesthesia Postprocedure Evaluation (Signed)
Anesthesia Post Note  Patient: Daniel Ochoa  Procedure(s) Performed: LIGATION OF LEFT ARM FISTULA (Left)     Patient location during evaluation: PACU Anesthesia Type: General Level of consciousness: sedated Pain management: pain level controlled Vital Signs Assessment: post-procedure vital signs reviewed and stable Respiratory status: spontaneous breathing, respiratory function stable and patient connected to nasal cannula oxygen Cardiovascular status: stable Postop Assessment: no apparent nausea or vomiting Anesthetic complications: no   No notable events documented.  Last Vitals:  Vitals:   06/01/21 0900 06/01/21 0915  BP: (!) 95/57 (!) 127/59  Pulse: (!) 53 (!) 56  Resp: 17 14  Temp:    SpO2: 96% 95%    Last Pain:  Vitals:   06/01/21 0915  TempSrc:   PainSc: 0-No pain                 Merlinda Frederick

## 2021-06-01 NOTE — Progress Notes (Signed)
Hypoglycemic Event  CBG: 57  Treatment: D50 25 mL (12.5 gm)  Symptoms: None  Follow-up CBG: [id number] CBG Result:95  Possible Reasons for Event: Other: npo with insulin pump on at normal basal rate  Comments/MD notified:removed insulin pump at this time, notified anesthesia    Daniel Ochoa A Daniel Ochoa

## 2021-06-01 NOTE — Interval H&P Note (Signed)
History and Physical Interval Note:  06/01/2021 7:26 AM  Daniel Ochoa  has presented today for surgery, with the diagnosis of CKD IV.  The various methods of treatment have been discussed with the patient and family. After consideration of risks, benefits and other options for treatment, the patient has consented to  Procedure(s): LIGATION OF LEFT ARM FISTULA (Left) as a surgical intervention.  The patient's history has been reviewed, patient examined, no change in status, stable for surgery.  I have reviewed the patient's chart and labs.  Questions were answered to the patient's satisfaction.     [name]

## 2021-06-01 NOTE — Anesthesia Procedure Notes (Signed)
Procedure Name: LMA Insertion Date/Time: 06/01/2021 7:48 AM Performed by: Lieutenant Diego, CRNA Pre-anesthesia Checklist: Patient identified, Emergency Drugs available, Suction available and Patient being monitored Patient Re-evaluated:Patient Re-evaluated prior to induction Oxygen Delivery Method: Circle system utilized Preoxygenation: Pre-oxygenation with 100% oxygen Induction Type: IV induction Ventilation: Mask ventilation without difficulty LMA: LMA inserted LMA Size: 5.0 Number of attempts: 1 Placement Confirmation: positive ETCO2 and breath sounds checked- equal and bilateral Tube secured with: Tape Dental Injury: Teeth and Oropharynx as per pre-operative assessment

## 2021-06-01 NOTE — Transfer of Care (Signed)
Immediate Anesthesia Transfer of Care Note  Patient: Daniel Ochoa  Procedure(s) Performed: LIGATION OF LEFT ARM FISTULA (Left)  Patient Location: PACU  Anesthesia Type:General  Level of Consciousness: awake  Airway & Oxygen Therapy: Patient Spontanous Breathing and Patient connected to face mask oxygen  Post-op Assessment: Report given to RN and Post -op Vital signs reviewed and stable  Post vital signs: Reviewed and stable  Last Vitals:  Vitals Value Taken Time  BP 132/56 06/01/21 0845  Temp    Pulse 58 06/01/21 0847  Resp 16 06/01/21 0847  SpO2 96 % 06/01/21 0847  Vitals shown include unvalidated device data.  Last Pain:  Vitals:   06/01/21 0648  TempSrc:   PainSc: 0-No pain      Patients Stated Pain Goal: 0 (70/48/88 9169)  Complications: No notable events documented.

## 2021-06-02 ENCOUNTER — Encounter (HOSPITAL_COMMUNITY): Payer: Self-pay | Admitting: Surgery

## 2021-06-06 ENCOUNTER — Telehealth: Payer: Self-pay | Admitting: *Deleted

## 2021-06-06 NOTE — Telephone Encounter (Signed)
Received call from [place] at [facility] medical stating that the pt has hand swelling and blisters after AVF ligation last Thursday. I called the pt and spoke with his wife. Pt reports that the ace bandage "was put on too tight" after the surgery and caused his hand to swell. She removed the bandage all together last night. The patient has many blisters on his hand. Explained to pts wife that the fistula was ligated due to arm swelling and the ace bandage was applied to try and reduce the swelling. Some of that swelling went the the patients hand and caused the blisters. Advised wife to have the patient elevated his hand periodically throughout the day and cover the blisters in a dry dressing. They are to call us back if the swelling gets worse or has not improved in the next few days. Wife verbalized understanding.

## 2021-06-08 ENCOUNTER — Encounter: Payer: Self-pay | Admitting: Physician Assistant

## 2021-06-08 ENCOUNTER — Other Ambulatory Visit: Payer: Self-pay

## 2021-06-08 ENCOUNTER — Ambulatory Visit (INDEPENDENT_AMBULATORY_CARE_PROVIDER_SITE_OTHER): Payer: Medicare Other | Admitting: Physician Assistant

## 2021-06-08 DIAGNOSIS — N184 Chronic kidney disease, stage 4 (severe): Secondary | ICD-10-CM

## 2021-06-08 DIAGNOSIS — T8189XA Other complications of procedures, not elsewhere classified, initial encounter: Secondary | ICD-10-CM

## 2021-06-08 DIAGNOSIS — Z1152 Encounter for screening for COVID-19: Secondary | ICD-10-CM | POA: Diagnosis not present

## 2021-06-08 NOTE — Progress Notes (Signed)
POST OPERATIVE OFFICE NOTE    CC:  F/u for surgery  HPI:  This is a 80 y.o. male who is ESRD with history of B UE access.  Left BC AVF that Dr. Scot Dock created in 8309, right Basilic fistula by Dr. Trula Slade 01/05/21 and most recent surgery was ligation of left AV fistula due to profound edema in his left arm.  Pt returns today for follow up.  Pt states the post op dressing was too tight and his wife loosened it.  He developed dorsal left hand blisters and wanted [place] to examine them.  He denise fever and chills.  He has a right UE Basilic fistula and is not on HD currently.  Allergies  Allergen Reactions   Statins Other (See Comments)    Elevated CK level   Latex Rash    Severe blistery rash    Current Outpatient Medications  Medication Sig Dispense Refill   Accu-Chek Softclix Lancets lancets USE TO CHECK BLOOD SUGAR UP TO TID E11.29     amLODipine (NORVASC) 5 MG tablet Take 5 mg by mouth daily.      atenolol (TENORMIN) 50 MG tablet Take 50 mg by mouth 2 (two) times daily.      Calcium-Vitamin D-Vitamin K ([employer] CALCIUM SOFT CHEWS) 650-12.5-40 MG-MCG-MCG CHEW Chew 1-2 tablets by mouth 2 (two) times daily. Take 1 tablet in the morning & take 2 tablets at night     carboxymethylcellulose (REFRESH PLUS) 0.5 % SOLN Place 1 drop into both eyes 3 (three) times daily as needed (allergy/irritated eyes.).     clonazePAM (KLONOPIN) 1 MG tablet Take 1 mg by mouth at bedtime.      famotidine (PEPCID) 40 MG tablet Take 40 mg by mouth at bedtime.      FREESTYLE TEST STRIPS test strip USE AS DIRECTED TO TEST BLOOD SUGAR 5 TIMES PER DAY. DX: E11.65  11   furosemide (LASIX) 40 MG tablet Take 40-80 mg by mouth See admin instructions. Take 2 tablets (80 mg) by mouth in the morning & take 1 tablet (40 mg) by mouth in the afternoon.     HUMALOG 100 UNIT/ML injection Inject into the skin daily. Via insulin pump     HYDROcodone-acetaminophen (NORCO/VICODIN) 5-325 MG tablet Take 1-2 tablets by mouth See admin  instructions. Take 1 tablet in the morning & take 2 tablets in the evening  0   Insulin Human (INSULIN PUMP) SOLN Inject into the skin. Humalog 100 unit/ml injection     ipratropium (ATROVENT) 0.03 % nasal spray Place 2 sprays into both nostrils daily as needed for rhinitis.     Lancets Misc. (ACCU-CHEK FASTCLIX LANCET) KIT USE TO CHECK BLOOD SUGAR UP TO TID E11.29     SYNTHROID 200 MCG tablet Take 200-300 mcg by mouth See admin instructions. Take 1 tablet (200 mcg) by mouth daily except on Sundays take 1.5 tablets (300 mcg)     vitamin C (ASCORBIC ACID) 250 MG tablet Take 250-500 mg by mouth See admin instructions. Take 1 tablet (250 mg) by mouth in the morning and take 2 tablets (500 mg) by mouth at night     No current facility-administered medications for this visit.     ROS:  See HPI  Physical Exam:      Extremities:  the first knuckle had a large blister on it I used a sterile needle and put a few small hole in the blister to allow drainage.  The incision in the upper arm is well  healed and there are good skin lines in the arm showing a decrease in edema.  He has a palpable radial pulses equal B.  The right Basilic fistula is patent, but pulsatile.   Assessment/Plan:  This is a 80 y.o. male who is s/p:S/P left UE fistula ligation for sever edema and pain.  The hand was dressing with xeroform and dry guaze.  They will redress it as needed and I will have him  f/u on Monday.  I will review the right basilic fistula pulsatile flow with Dr. Trula Slade at that time.  He is currently not on HD.    Roxy Horseman PA-C [facility] [phone]   Clinic MD:  Scot Dock

## 2021-06-12 ENCOUNTER — Other Ambulatory Visit: Payer: Self-pay

## 2021-06-12 ENCOUNTER — Ambulatory Visit (INDEPENDENT_AMBULATORY_CARE_PROVIDER_SITE_OTHER): Payer: Medicare Other | Admitting: Physician Assistant

## 2021-06-12 ENCOUNTER — Encounter: Payer: Self-pay | Admitting: Physician Assistant

## 2021-06-12 VITALS — BP 176/78 | HR 60 | Temp 97.5°F | Resp 20 | Ht 73.0 in | Wt 272.0 lb

## 2021-06-12 DIAGNOSIS — N184 Chronic kidney disease, stage 4 (severe): Secondary | ICD-10-CM

## 2021-06-12 NOTE — Progress Notes (Signed)
POST OPERATIVE OFFICE NOTE    CC:  F/u for surgery  HPI:  This is a 80 y.o. male who has CKD s/p B UE access.  Left BC AVF that Dr. Scot Dock created in 2841, right Basilic fistula by Dr. Trula Slade 01/05/21 and most recent surgery was ligation of left AV fistula due to profound edema in his left arm.  Pt returns today for follow up.  Pt states the left hand is healing well and the edema continues to dissipate slowly.  They can no longer feel a thrill at all in the right UE hybrid AV fistula.  He is currently not requiring HD.  He is followed by Dr. Moshe Cipro.   Allergies  Allergen Reactions   Statins Other (See Comments)    Elevated CK level   Latex Rash    Severe blistery rash    Current Outpatient Medications  Medication Sig Dispense Refill   Accu-Chek Softclix Lancets lancets USE TO CHECK BLOOD SUGAR UP TO TID E11.29     amLODipine (NORVASC) 5 MG tablet Take 5 mg by mouth daily.      atenolol (TENORMIN) 50 MG tablet Take 50 mg by mouth 2 (two) times daily.      Calcium-Vitamin D-Vitamin K ([employer]S CALCIUM SOFT CHEWS) 650-12.5-40 MG-MCG-MCG CHEW Chew 1-2 tablets by mouth 2 (two) times daily. Take 1 tablet in the morning & take 2 tablets at night     carboxymethylcellulose (REFRESH PLUS) 0.5 % SOLN Place 1 drop into both eyes 3 (three) times daily as needed (allergy/irritated eyes.).     clonazePAM (KLONOPIN) 1 MG tablet Take 1 mg by mouth at bedtime.      famotidine (PEPCID) 40 MG tablet Take 40 mg by mouth at bedtime.      FREESTYLE TEST STRIPS test strip USE AS DIRECTED TO TEST BLOOD SUGAR 5 TIMES PER DAY. DX: E11.65  11   furosemide (LASIX) 40 MG tablet Take 40-80 mg by mouth See admin instructions. Take 2 tablets (80 mg) by mouth in the morning & take 1 tablet (40 mg) by mouth in the afternoon.     HUMALOG 100 UNIT/ML injection Inject into the skin daily. Via insulin pump     HYDROcodone-acetaminophen (NORCO/VICODIN) 5-325 MG tablet Take 1-2 tablets by mouth See admin instructions.  Take 1 tablet in the morning & take 2 tablets in the evening  0   Insulin Human (INSULIN PUMP) SOLN Inject into the skin. Humalog 100 unit/ml injection     ipratropium (ATROVENT) 0.03 % nasal spray Place 2 sprays into both nostrils daily as needed for rhinitis.     Lancets Misc. (ACCU-CHEK FASTCLIX LANCET) KIT USE TO CHECK BLOOD SUGAR UP TO TID E11.29     Semaglutide, 1 MG/DOSE, (OZEMPIC, 1 MG/DOSE,) 4 MG/3ML SOPN inject 69m     SYNTHROID 200 MCG tablet Take 200-300 mcg by mouth See admin instructions. Take 1 tablet (200 mcg) by mouth daily except on Sundays take 1.5 tablets (300 mcg)     vitamin C (ASCORBIC ACID) 250 MG tablet Take 250-500 mg by mouth See admin instructions. Take 1 tablet (250 mg) by mouth in the morning and take 2 tablets (500 mg) by mouth at night     No current facility-administered medications for this visit.     ROS:  See HPI  Physical Exam:     Incision:  incisions are well healed B UE  Extremities:  the left hand blisters are healing well without signs of infection.  The edema  is better with good skin lines.  The right UE is absent of a thrill and no doppler flow in the fistula.  He has a palpable radial pulse on B UE. Neuro: sensation/ motor intact and equal B hands.     Assessment/Plan:  This is a 80 y.o. male who is s/p:B UE access.  Left BC AVF that Dr. Scot Dock created in 3354, right Basilic fistula by Dr. Trula Slade 01/05/21 and most recent surgery was ligation of left AV fistula due to profound edema in his left arm.  Failed B UE fistulas with the left requiring ligation and the right occluding.  At this time since he is not on HD we will not pursue further access. If Dr. Moshe Cipro thinks he needs urgent access in the future we will plan for right UE AV graft +/- [facility] she can let us know.    Elevation , mobility and mild compression to left UE for edema is recommended.   F/U PRN   Roxy Horseman PA-C [facility] [phone]   Clinic MD:  Trula Slade

## 2021-06-21 DIAGNOSIS — N185 Chronic kidney disease, stage 5: Secondary | ICD-10-CM | POA: Diagnosis not present

## 2021-06-21 DIAGNOSIS — N390 Urinary tract infection, site not specified: Secondary | ICD-10-CM | POA: Diagnosis not present

## 2021-06-27 DIAGNOSIS — E1122 Type 2 diabetes mellitus with diabetic chronic kidney disease: Secondary | ICD-10-CM | POA: Diagnosis not present

## 2021-06-27 DIAGNOSIS — I120 Hypertensive chronic kidney disease with stage 5 chronic kidney disease or end stage renal disease: Secondary | ICD-10-CM | POA: Diagnosis not present

## 2021-06-27 DIAGNOSIS — D631 Anemia in chronic kidney disease: Secondary | ICD-10-CM | POA: Diagnosis not present

## 2021-06-27 DIAGNOSIS — N390 Urinary tract infection, site not specified: Secondary | ICD-10-CM | POA: Diagnosis not present

## 2021-06-27 DIAGNOSIS — N185 Chronic kidney disease, stage 5: Secondary | ICD-10-CM | POA: Diagnosis not present

## 2021-07-02 ENCOUNTER — Ambulatory Visit (HOSPITAL_BASED_OUTPATIENT_CLINIC_OR_DEPARTMENT_OTHER): Payer: Medicare Other | Attending: Internal Medicine | Admitting: Internal Medicine

## 2021-07-02 ENCOUNTER — Other Ambulatory Visit: Payer: Self-pay

## 2021-07-02 DIAGNOSIS — R0902 Hypoxemia: Secondary | ICD-10-CM | POA: Insufficient documentation

## 2021-07-02 DIAGNOSIS — G4733 Obstructive sleep apnea (adult) (pediatric): Secondary | ICD-10-CM

## 2021-07-02 DIAGNOSIS — I493 Ventricular premature depolarization: Secondary | ICD-10-CM | POA: Insufficient documentation

## 2021-07-02 DIAGNOSIS — G4734 Idiopathic sleep related nonobstructive alveolar hypoventilation: Secondary | ICD-10-CM

## 2021-07-02 DIAGNOSIS — G4731 Primary central sleep apnea: Secondary | ICD-10-CM | POA: Insufficient documentation

## 2021-07-06 DIAGNOSIS — G4733 Obstructive sleep apnea (adult) (pediatric): Secondary | ICD-10-CM | POA: Diagnosis not present

## 2021-07-10 NOTE — Procedures (Signed)
NAME: Daniel Ochoa DATE OF BIRTH:  1941/03/03 MEDICAL RECORD NUMBER [id number]  LOCATION: [facility]  PHYSICIAN: Marius Ditch  DATE OF STUDY: 07/02/2021  SLEEP STUDY TYPE: Positive Airway Pressure Titration               REFERRING PHYSICIAN: Marius Ditch, MD  EPWORTH SLEEPINESS SCORE:  24 HEIGHT: 5\' 7"  (170.2 cm)  WEIGHT: 253 lb (114.8 kg)    Body mass index is 39.63 kg/m.  NECK SIZE: 17 in.  CLINICAL INFORMATION The patient was referred to the sleep center for PAP titration. Most recent titration study dated 05/18/2019 was suboptimal at 18cm H2O with an AHI of 32.5/h. Subsequent downloads of CPAP have indicated that he has an AHI of 31 and an overnight oximetry noted inadequate oxygenation. Download suggested little in the way of central apnea.   MEDICATIONS Sleep medicine administered - Clonazepam 1 mg at 09:30:45 PM.  SLEEP STUDY TECHNIQUE The patient underwent an attended overnight polysomnography titration to assess the effects of cpap therapy. The following variables were monitored: EEG (C4-A1, C3-A2, O1-A2, O2-A1, F3-M2, F4-M1), EOG, submental and leg EMG, ECG, oxyhemoglobin saturation by pulse oximetry, thoracic and abdominal respiratory effort belts, nasal/oral airflow by pressure sensor, body position sensor and snoring sensor. CPAP pressure was titrated to eliminate apneas, hypopneas and oxygen desaturation.  TECHNICAL COMMENTS Comments added by Technician: Patient was restless all through the night. Patient sleep in a recliner chair. Comments added by Scorer: N/A  SLEEP ARCHITECTURE The study was initiated at 11:16:12 PM and terminated at 5:18:01 AM. Total recorded time was 361.8 minutes. EEG confirmed total sleep time was 145.5 minutes yielding a sleep efficiency of 40.2%. Sleep onset after lights out was 0.6 minutes with a REM latency of 135.0 minutes. The patient spent 51.9% of the night in stage N1 sleep, 37.1% in stage N2 sleep,  0.0% in stage N3 and 11% in REM. The Arousal Index was 70.1/hour.  RESPIRATORY PARAMETERS The overall AHI was 27.2 per hour, with a central apnea index of 19.8 per hour. Optimal pressure was not achieved. The sleep efficiency was 40.2% and the patient was supine for 0.00%. The arousal index was 70.1 per hour.The oxygen nadir was 79.0% during sleep. He was progressively titrated to 18/11. When central apneas started at pressure of 14/8, he was changed to BPAP ST. BPAP ST 16/11 with backup rate of 10/minute seemed to give adequate airway control and oxygenation. However, this pressure was only observed for about 15 minutes.    LEG MOVEMENT DATA The total leg movements were 0 with a resulting leg movement index of 0.0. Associated arousal with leg movement index was 0.0.  CARDIAC DATA The underlying cardiac rhythm was most consistent with sinus rhythm. Mean heart rate during sleep was 56.2 bpm. Additional rhythm abnormalities include PVCs.  IMPRESSIONS - Obstructive Sleep Apnea (OSA) Sub-Optimal titration - Central Sleep Apnea emerged in the study. - Electrocardiographic data showed presence of PVCs. - Severe Oxygen Desaturation improved but not resolved with BPAP - Mark reduced sleep efficiency limited opportunity for proper titration  DIAGNOSIS - Obstructive Sleep Apnea (G47.33) - Central Sleep Apnea  RECOMMENDATIONS - Consider trial of BPAP ST with pressure of 16/11 with backup rate of 10. Alternatively, another PAP titration could be considered due to low sleep efficiency. If that is done ASV could be considered if BPAP ST does not result in adequate treatment.  Marius Ditch Sleep specialist, [place] Board of [employer] Medicine  ELECTRONICALLY  SIGNED ON:  07/10/2021, 7:33 PM [place] PH: [phone]   FX: [phone] [place]

## 2021-07-18 DIAGNOSIS — N186 End stage renal disease: Secondary | ICD-10-CM | POA: Diagnosis not present

## 2021-07-18 DIAGNOSIS — Z992 Dependence on renal dialysis: Secondary | ICD-10-CM | POA: Diagnosis not present

## 2021-07-20 DIAGNOSIS — E039 Hypothyroidism, unspecified: Secondary | ICD-10-CM | POA: Diagnosis not present

## 2021-07-20 DIAGNOSIS — Z125 Encounter for screening for malignant neoplasm of prostate: Secondary | ICD-10-CM | POA: Diagnosis not present

## 2021-07-20 DIAGNOSIS — E1129 Type 2 diabetes mellitus with other diabetic kidney complication: Secondary | ICD-10-CM | POA: Diagnosis not present

## 2021-07-20 DIAGNOSIS — I10 Essential (primary) hypertension: Secondary | ICD-10-CM | POA: Diagnosis not present

## 2021-07-27 DIAGNOSIS — G4733 Obstructive sleep apnea (adult) (pediatric): Secondary | ICD-10-CM | POA: Diagnosis not present

## 2021-07-27 DIAGNOSIS — G309 Alzheimer's disease, unspecified: Secondary | ICD-10-CM | POA: Diagnosis not present

## 2021-07-27 DIAGNOSIS — H3500 Unspecified background retinopathy: Secondary | ICD-10-CM | POA: Diagnosis not present

## 2021-07-27 DIAGNOSIS — L989 Disorder of the skin and subcutaneous tissue, unspecified: Secondary | ICD-10-CM | POA: Diagnosis not present

## 2021-07-27 DIAGNOSIS — Z0000 Encounter for general adult medical examination without abnormal findings: Secondary | ICD-10-CM | POA: Diagnosis not present

## 2021-07-27 DIAGNOSIS — I129 Hypertensive chronic kidney disease with stage 1 through stage 4 chronic kidney disease, or unspecified chronic kidney disease: Secondary | ICD-10-CM | POA: Diagnosis not present

## 2021-07-27 DIAGNOSIS — E1129 Type 2 diabetes mellitus with other diabetic kidney complication: Secondary | ICD-10-CM | POA: Diagnosis not present

## 2021-07-27 DIAGNOSIS — M21371 Foot drop, right foot: Secondary | ICD-10-CM | POA: Diagnosis not present

## 2021-07-27 DIAGNOSIS — I10 Essential (primary) hypertension: Secondary | ICD-10-CM | POA: Diagnosis not present

## 2021-07-27 DIAGNOSIS — R82998 Other abnormal findings in urine: Secondary | ICD-10-CM | POA: Diagnosis not present

## 2021-07-27 DIAGNOSIS — N184 Chronic kidney disease, stage 4 (severe): Secondary | ICD-10-CM | POA: Diagnosis not present

## 2021-07-27 DIAGNOSIS — I480 Paroxysmal atrial fibrillation: Secondary | ICD-10-CM | POA: Diagnosis not present

## 2021-07-27 DIAGNOSIS — E039 Hypothyroidism, unspecified: Secondary | ICD-10-CM | POA: Diagnosis not present

## 2021-07-28 ENCOUNTER — Ambulatory Visit (INDEPENDENT_AMBULATORY_CARE_PROVIDER_SITE_OTHER): Payer: Medicare Other | Admitting: Podiatry

## 2021-07-28 ENCOUNTER — Encounter: Payer: Self-pay | Admitting: Podiatry

## 2021-07-28 ENCOUNTER — Other Ambulatory Visit: Payer: Self-pay

## 2021-07-28 DIAGNOSIS — B351 Tinea unguium: Secondary | ICD-10-CM | POA: Diagnosis not present

## 2021-07-28 DIAGNOSIS — M2011 Hallux valgus (acquired), right foot: Secondary | ICD-10-CM | POA: Diagnosis not present

## 2021-07-28 DIAGNOSIS — E1142 Type 2 diabetes mellitus with diabetic polyneuropathy: Secondary | ICD-10-CM

## 2021-07-28 DIAGNOSIS — M2012 Hallux valgus (acquired), left foot: Secondary | ICD-10-CM | POA: Diagnosis not present

## 2021-07-28 DIAGNOSIS — M2041 Other hammer toe(s) (acquired), right foot: Secondary | ICD-10-CM | POA: Diagnosis not present

## 2021-07-28 DIAGNOSIS — M2042 Other hammer toe(s) (acquired), left foot: Secondary | ICD-10-CM | POA: Diagnosis not present

## 2021-07-28 DIAGNOSIS — E119 Type 2 diabetes mellitus without complications: Secondary | ICD-10-CM | POA: Diagnosis not present

## 2021-08-01 NOTE — Progress Notes (Signed)
ANNUAL DIABETIC FOOT EXAM  Subjective: Daniel Ochoa presents today for for annual diabetic foot examination, at risk foot care with history of diabetic neuropathy, and thick, elongated toenails 1-5 bilaterally which are tender when wearing enclosed shoe gear..  Patient relates 80 year h/o diabetes. His wife is present during today's visit. She is very involved in his foot care performing daily foot inspections and providing any treatment needed for him.   Patient does have h/o digital wounds which have healed uneventfully.  Patient has been diagnosed with neuropathy.  Patient's blood sugar was below 120 mg/dl today.   [name], MD is patient's PCP. Last visit was 07/27/2021.  Past Medical History:  Diagnosis Date   Acute deep vein thrombosis (DVT) of tibial vein of right lower extremity ([name]) 04/13/2014   Acute pulmonary embolism (HCC) 04/15/2014   Arthritis    Chronic fatigue    Chronic kidney disease, stage IV (severe) (HCC)    Complication of anesthesia    one surgery took 4 people to hold him down 2000   Constipation    Diabetic peripheral neuropathy ([place]) 10/26/2015   DM (diabetes mellitus), type 2, uncontrolled, with renal complications    [facility]   DVT (deep venous thrombosis) ([place])    RLE DVT with intermediate risk VQ scan for PE 03/2014   Dyspnea    Essential hypertension    Fibromyalgia    Hypothyroidism    Hypoxia 04/13/2014   Neuropathy    Obesity    OSA on CPAP    Pulmonary embolism ([place]) 04/13/2014   VQ 04/13/14 intermediate assoc with R DVT > on coumadin since Echo 04/14/14 > PA peak pressure: 89 mm Hg (S).    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    Muscle weakness 09/30/2018   Long term (current) use of insulin ([place]) 01/23/2018   Encounter for general adult medical examination without abnormal findings 01/07/2018   Alzheimer's disease ([place]) 04/23/2017   Diabetic renal disease ([place]) 04/23/2017   Hypothyroidism 04/23/2017   Morbid obesity ([place]) 04/23/2017   Neck pain 04/23/2017   Peripheral venous insufficiency 04/23/2017   Retinal disorder 04/23/2017   Diabetic peripheral neuropathy ([place]) 10/26/2015   Dyspnea 07/23/2014   Acute pulmonary embolism ([place]) 04/15/2014   Hypoxia 04/13/2014   Acute deep vein thrombosis (DVT) of tibial vein of right lower extremity ([place]) 04/13/2014   Pulmonary embolism ([place]) 04/13/2014   DM (diabetes mellitus), type 2, uncontrolled, with renal complications    Essential hypertension    OSA on CPAP    Chronic kidney disease, stage IV (severe) ([place])    Past Surgical History:  Procedure Laterality Date   AV FISTULA PLACEMENT Left 04/18/2017   Procedure: ARTERIOVENOUS (AV) FISTULA CREATION;  Surgeon: Angelia Mould, MD;  Location: [facility];  Service: Vascular;  Laterality: Left;   [place] Right 11/14/2020   Procedure: BASCILIC VEIN TRANSPOSITION 1ST STAGE RIGHT;  Surgeon: Angelia Mould, MD;  Location: [facility];  Service: Vascular;  Laterality: Right;   [place] Right 01/05/2021   Procedure: RIGHT SECOND STAGE [place];  Surgeon: Serafina Mitchell, MD;  Location: [facility];  Service: Vascular;  Laterality: Right;   CERVICAL LAMINECTOMY  LIGATION OF ARTERIOVENOUS  FISTULA Left 06/01/2021   Procedure: LIGATION OF LEFT ARM FISTULA;  Surgeon: Serafina Mitchell, MD;  Location: [facility];  Service: Vascular;  Laterality: Left;   LUMBAR FUSION     x 2   ROTATOR CUFF REPAIR Right    THYROIDECTOMY, PARTIAL     Current Outpatient Medications on File Prior to Visit  Medication Sig Dispense Refill   Accu-Chek Softclix Lancets lancets USE TO CHECK BLOOD  SUGAR UP TO TID E11.29     amLODipine (NORVASC) 5 MG tablet Take 5 mg by mouth daily.      amoxicillin-clavulanate (AUGMENTIN) 250-125 MG tablet Take 1 tablet by mouth 2 (two) times daily.     atenolol (TENORMIN) 50 MG tablet Take 50 mg by mouth 2 (two) times daily.      BINAXNOW COVID-19 AG HOME TEST KIT Use as Directed on the Package     Calcium-Vitamin D-Vitamin K (CVS CALCIUM SOFT CHEWS) 650-12.5-40 MG-MCG-MCG CHEW Chew 1-2 tablets by mouth 2 (two) times daily. Take 1 tablet in the morning & take 2 tablets at night     carboxymethylcellulose (REFRESH PLUS) 0.5 % SOLN Place 1 drop into both eyes 3 (three) times daily as needed (allergy/irritated eyes.).     clonazePAM (KLONOPIN) 1 MG tablet Take 1 mg by mouth at bedtime.      famotidine (PEPCID) 40 MG tablet Take 40 mg by mouth at bedtime.      FREESTYLE TEST STRIPS test strip USE AS DIRECTED TO TEST BLOOD SUGAR 5 TIMES PER DAY. DX: E11.65  11   furosemide (LASIX) 40 MG tablet Take 40-80 mg by mouth See admin instructions. Take 2 tablets (80 mg) by mouth in the morning & take 1 tablet (40 mg) by mouth in the afternoon.     HUMALOG 100 UNIT/ML injection Inject into the skin daily. Via insulin pump     HYDROcodone-acetaminophen (NORCO/VICODIN) 5-325 MG tablet Take 1-2 tablets by mouth See admin instructions. Take 1 tablet in the morning & take 2 tablets in the evening  0   Insulin Human (INSULIN PUMP) SOLN Inject into the skin. Humalog 100 unit/ml injection     ipratropium (ATROVENT) 0.03 % nasal spray Place 2 sprays into both nostrils daily as needed for rhinitis.     Lancets Misc. (ACCU-CHEK FASTCLIX LANCET) KIT USE TO CHECK BLOOD SUGAR UP TO TID E11.29     Semaglutide, 1 MG/DOSE, (OZEMPIC, 1 MG/DOSE,) 4 MG/3ML SOPN inject 76m     SYNTHROID 200 MCG tablet Take 200-300 mcg by mouth See admin instructions. Take 1 tablet (200 mcg) by mouth daily except on Sundays take 1.5 tablets (300 mcg)     vitamin C (ASCORBIC ACID) 250 MG tablet Take 250-500 mg  by mouth See admin instructions. Take 1 tablet (250 mg) by mouth in the morning and take 2 tablets (500 mg) by mouth at night     No current facility-administered medications on file prior to visit.    Allergies  Allergen Reactions   Statins Other (See Comments)    Elevated CK level   Latex Rash    Severe blistery rash   Social History   Occupational History   Not on file  Tobacco Use   Smoking status: Former    Types: Pipe   Smokeless tobacco: Never   Tobacco comments:    quit in 1993  Vaping Use   Vaping Use: Never used  Substance and Sexual Activity   Alcohol use: No  Drug use: No   Sexual activity: Not on file   Family History  Problem Relation Age of Onset   Cancer Father    Immunization History  Administered Date(s) Administered   Influenza Split 06/17/2014, 08/05/2014   Influenza, Quadrivalent, Recombinant, Inj, Pf 06/10/2018, 05/29/2019, 06/27/2020   PFIZER(Purple Top)SARS-COV-2 Vaccination 10/08/2019, 10/29/2019, 06/17/2020   Pneumococcal-Unspecified 06/17/2013   Zoster Recombinat (Shingrix) 06/11/2019   Zoster, Live 06/11/2019, 08/29/2019     Review of Systems: Negative except as noted in the HPI.   Objective: There were no vitals filed for this visit.  Markale Birdsell is a pleasant 80 y.o. male in NAD. AAO X 3.  Vascular Examination: CFT <3 seconds b/l LE. Palpable pedal pulses b/l LE. Pedal hair absent. No pain with calf compression RLE. Nonpitting edema noted BLE.  Dermatological Examination: Pedal integument with normal turgor, texture and tone BLE. No open wounds b/l LE. No interdigital macerations noted b/l LE. Toenails bilateral great toes well maintained with adequate length. No erythema, no edema, no drainage, no fluctuance. Toenails 2-5 bilaterally elongated, discolored, dystrophic, thickened, and crumbly with subungual debris and tenderness to dorsal palpation.  Musculoskeletal Examination: Normal muscle strength 5/5 to all lower  extremity muscle groups bilaterally. No pain, crepitus or joint limitation noted with ROM b/l LE. No gross bony pedal deformities b/l. Patient ambulates independently without assistive aids. Normal muscle strength 5/5 to all lower extremity muscle groups bilaterally. HAV with bunion deformity noted b/l LE. Hammertoe deformity noted 2-5 b/l.  Footwear Assessment: Does the patient wear appropriate shoes? Yes. Does the patient need inserts/orthotics? Yes.  Neurological Examination: Protective sensation intact 5/5 intact bilaterally with 10g monofilament b/l. Vibratory sensation diminished b/l.  Assessment: 1. Onychomycosis   2. Diabetic peripheral neuropathy associated with type 2 diabetes mellitus (HCC)   3. Hallux valgus, acquired, bilateral   4. Acquired hammertoes of both feet   5. Encounter for diabetic foot exam ([place])     ADA Risk Categorization: High Risk  Patient has one or more of the following: Loss of protective sensation Absent pedal pulses Severe Foot deformity History of foot ulcer  Plan: -Examined patient. -Diabetic foot examination performed today. -Continue diabetic foot care principles: inspect feet daily, monitor glucose as recommended by PCP and/or Endocrinologist, and follow prescribed diet per PCP, Endocrinologist and/or dietician. -Mycotic toenails 2-5 bilaterally were debrided in length and girth with sterile nail nippers and dremel without iatrogenic bleeding. -Patient/POA to call should there be question/concern in the interim.  Return in about 3 months (around 10/28/2021).  Marzetta Board, DPM

## 2021-08-07 DIAGNOSIS — N186 End stage renal disease: Secondary | ICD-10-CM | POA: Diagnosis not present

## 2021-08-28 DIAGNOSIS — N185 Chronic kidney disease, stage 5: Secondary | ICD-10-CM | POA: Diagnosis not present

## 2021-08-28 DIAGNOSIS — N390 Urinary tract infection, site not specified: Secondary | ICD-10-CM | POA: Diagnosis not present

## 2021-08-29 DIAGNOSIS — E1142 Type 2 diabetes mellitus with diabetic polyneuropathy: Secondary | ICD-10-CM | POA: Diagnosis not present

## 2021-08-29 DIAGNOSIS — Z87891 Personal history of nicotine dependence: Secondary | ICD-10-CM | POA: Diagnosis not present

## 2021-08-29 DIAGNOSIS — G4733 Obstructive sleep apnea (adult) (pediatric): Secondary | ICD-10-CM | POA: Diagnosis not present

## 2021-08-29 DIAGNOSIS — I120 Hypertensive chronic kidney disease with stage 5 chronic kidney disease or end stage renal disease: Secondary | ICD-10-CM | POA: Diagnosis not present

## 2021-08-29 DIAGNOSIS — Z992 Dependence on renal dialysis: Secondary | ICD-10-CM | POA: Diagnosis not present

## 2021-08-29 DIAGNOSIS — E1122 Type 2 diabetes mellitus with diabetic chronic kidney disease: Secondary | ICD-10-CM | POA: Diagnosis not present

## 2021-08-29 DIAGNOSIS — Z888 Allergy status to other drugs, medicaments and biological substances status: Secondary | ICD-10-CM | POA: Diagnosis not present

## 2021-08-29 DIAGNOSIS — Z79890 Hormone replacement therapy (postmenopausal): Secondary | ICD-10-CM | POA: Diagnosis not present

## 2021-08-29 DIAGNOSIS — Z91040 Latex allergy status: Secondary | ICD-10-CM | POA: Diagnosis not present

## 2021-08-29 DIAGNOSIS — E039 Hypothyroidism, unspecified: Secondary | ICD-10-CM | POA: Diagnosis not present

## 2021-08-29 DIAGNOSIS — Z6841 Body Mass Index (BMI) 40.0 and over, adult: Secondary | ICD-10-CM | POA: Diagnosis not present

## 2021-08-29 DIAGNOSIS — N186 End stage renal disease: Secondary | ICD-10-CM | POA: Diagnosis not present

## 2021-08-29 DIAGNOSIS — Z794 Long term (current) use of insulin: Secondary | ICD-10-CM | POA: Diagnosis not present

## 2021-08-29 DIAGNOSIS — N184 Chronic kidney disease, stage 4 (severe): Secondary | ICD-10-CM | POA: Diagnosis not present

## 2021-08-29 DIAGNOSIS — I10 Essential (primary) hypertension: Secondary | ICD-10-CM | POA: Diagnosis not present

## 2021-08-29 DIAGNOSIS — Z79899 Other long term (current) drug therapy: Secondary | ICD-10-CM | POA: Diagnosis not present

## 2021-09-04 DIAGNOSIS — D0461 Carcinoma in situ of skin of right upper limb, including shoulder: Secondary | ICD-10-CM | POA: Diagnosis not present

## 2021-09-04 DIAGNOSIS — D485 Neoplasm of uncertain behavior of skin: Secondary | ICD-10-CM | POA: Diagnosis not present

## 2021-09-04 DIAGNOSIS — C44622 Squamous cell carcinoma of skin of right upper limb, including shoulder: Secondary | ICD-10-CM | POA: Diagnosis not present

## 2021-09-04 DIAGNOSIS — L218 Other seborrheic dermatitis: Secondary | ICD-10-CM | POA: Diagnosis not present

## 2021-09-04 DIAGNOSIS — L570 Actinic keratosis: Secondary | ICD-10-CM | POA: Diagnosis not present

## 2021-09-04 DIAGNOSIS — C44319 Basal cell carcinoma of skin of other parts of face: Secondary | ICD-10-CM | POA: Diagnosis not present

## 2021-09-06 DIAGNOSIS — N185 Chronic kidney disease, stage 5: Secondary | ICD-10-CM | POA: Diagnosis not present

## 2021-09-06 DIAGNOSIS — D631 Anemia in chronic kidney disease: Secondary | ICD-10-CM | POA: Diagnosis not present

## 2021-09-06 DIAGNOSIS — I120 Hypertensive chronic kidney disease with stage 5 chronic kidney disease or end stage renal disease: Secondary | ICD-10-CM | POA: Diagnosis not present

## 2021-09-06 DIAGNOSIS — I770 Arteriovenous fistula, acquired: Secondary | ICD-10-CM | POA: Diagnosis not present

## 2021-09-06 DIAGNOSIS — E1122 Type 2 diabetes mellitus with diabetic chronic kidney disease: Secondary | ICD-10-CM | POA: Diagnosis not present

## 2021-09-06 DIAGNOSIS — N390 Urinary tract infection, site not specified: Secondary | ICD-10-CM | POA: Diagnosis not present

## 2021-09-26 DIAGNOSIS — N186 End stage renal disease: Secondary | ICD-10-CM | POA: Diagnosis not present

## 2021-09-26 DIAGNOSIS — Z4889 Encounter for other specified surgical aftercare: Secondary | ICD-10-CM | POA: Diagnosis not present

## 2021-10-04 DIAGNOSIS — C44622 Squamous cell carcinoma of skin of right upper limb, including shoulder: Secondary | ICD-10-CM | POA: Diagnosis not present

## 2021-10-04 DIAGNOSIS — L989 Disorder of the skin and subcutaneous tissue, unspecified: Secondary | ICD-10-CM | POA: Diagnosis not present

## 2021-10-10 DIAGNOSIS — N390 Urinary tract infection, site not specified: Secondary | ICD-10-CM | POA: Diagnosis not present

## 2021-10-10 DIAGNOSIS — N189 Chronic kidney disease, unspecified: Secondary | ICD-10-CM | POA: Diagnosis not present

## 2021-10-11 DIAGNOSIS — Z6836 Body mass index (BMI) 36.0-36.9, adult: Secondary | ICD-10-CM | POA: Diagnosis not present

## 2021-10-11 DIAGNOSIS — I2721 Secondary pulmonary arterial hypertension: Secondary | ICD-10-CM | POA: Diagnosis not present

## 2021-10-11 DIAGNOSIS — G4733 Obstructive sleep apnea (adult) (pediatric): Secondary | ICD-10-CM | POA: Diagnosis not present

## 2021-10-11 DIAGNOSIS — E039 Hypothyroidism, unspecified: Secondary | ICD-10-CM | POA: Diagnosis not present

## 2021-10-11 DIAGNOSIS — I10 Essential (primary) hypertension: Secondary | ICD-10-CM | POA: Diagnosis not present

## 2021-10-11 DIAGNOSIS — N184 Chronic kidney disease, stage 4 (severe): Secondary | ICD-10-CM | POA: Diagnosis not present

## 2021-10-11 DIAGNOSIS — E1122 Type 2 diabetes mellitus with diabetic chronic kidney disease: Secondary | ICD-10-CM | POA: Diagnosis not present

## 2021-10-15 DIAGNOSIS — I10 Essential (primary) hypertension: Secondary | ICD-10-CM | POA: Diagnosis not present

## 2021-10-15 DIAGNOSIS — I480 Paroxysmal atrial fibrillation: Secondary | ICD-10-CM | POA: Diagnosis not present

## 2021-10-15 DIAGNOSIS — E039 Hypothyroidism, unspecified: Secondary | ICD-10-CM | POA: Diagnosis not present

## 2021-10-15 DIAGNOSIS — N184 Chronic kidney disease, stage 4 (severe): Secondary | ICD-10-CM | POA: Diagnosis not present

## 2021-10-26 DIAGNOSIS — C44319 Basal cell carcinoma of skin of other parts of face: Secondary | ICD-10-CM | POA: Diagnosis not present

## 2021-11-02 DIAGNOSIS — N185 Chronic kidney disease, stage 5: Secondary | ICD-10-CM | POA: Diagnosis not present

## 2021-11-03 ENCOUNTER — Other Ambulatory Visit: Payer: Self-pay

## 2021-11-03 ENCOUNTER — Encounter: Payer: Self-pay | Admitting: Podiatry

## 2021-11-03 ENCOUNTER — Ambulatory Visit (INDEPENDENT_AMBULATORY_CARE_PROVIDER_SITE_OTHER): Payer: Medicare Other | Admitting: Podiatry

## 2021-11-03 DIAGNOSIS — M5136 Other intervertebral disc degeneration, lumbar region: Secondary | ICD-10-CM | POA: Insufficient documentation

## 2021-11-03 DIAGNOSIS — K5900 Constipation, unspecified: Secondary | ICD-10-CM | POA: Insufficient documentation

## 2021-11-03 DIAGNOSIS — E1142 Type 2 diabetes mellitus with diabetic polyneuropathy: Secondary | ICD-10-CM

## 2021-11-03 DIAGNOSIS — E1151 Type 2 diabetes mellitus with diabetic peripheral angiopathy without gangrene: Secondary | ICD-10-CM

## 2021-11-03 DIAGNOSIS — E11319 Type 2 diabetes mellitus with unspecified diabetic retinopathy without macular edema: Secondary | ICD-10-CM | POA: Insufficient documentation

## 2021-11-03 DIAGNOSIS — N185 Chronic kidney disease, stage 5: Secondary | ICD-10-CM | POA: Diagnosis not present

## 2021-11-03 DIAGNOSIS — Z87891 Personal history of nicotine dependence: Secondary | ICD-10-CM | POA: Diagnosis not present

## 2021-11-03 DIAGNOSIS — I5189 Other ill-defined heart diseases: Secondary | ICD-10-CM | POA: Insufficient documentation

## 2021-11-03 DIAGNOSIS — Z794 Long term (current) use of insulin: Secondary | ICD-10-CM | POA: Diagnosis not present

## 2021-11-03 DIAGNOSIS — H259 Unspecified age-related cataract: Secondary | ICD-10-CM | POA: Insufficient documentation

## 2021-11-03 DIAGNOSIS — Z6836 Body mass index (BMI) 36.0-36.9, adult: Secondary | ICD-10-CM | POA: Insufficient documentation

## 2021-11-03 DIAGNOSIS — B351 Tinea unguium: Secondary | ICD-10-CM

## 2021-11-03 NOTE — Patient Instructions (Signed)
Peripheral Vascular Disease ?Peripheral vascular disease (PVD) is a disease of the blood vessels that carry blood from the heart to the rest of the body. PVD is also called peripheral artery disease (PAD) or poor circulation. PVD affects most of the body. But it affects the legs and feet the most. ?PVD can lead to acute limb ischemia. This happens when there is a sudden stop of blood flow to an arm or leg. This is a medical emergency. ?What are the causes? ?The most common cause of PVD is a buildup of a fatty substance (plaque) inside your arteries. This decreases blood flow. Plaque can break off and block blood in a smaller artery. This can lead to acute limb ischemia. ?Other common causes of PVD include: ?Blood clots inside the blood vessels. ?Injuries to blood vessels. ?Irritation and swelling of blood vessels. ?Sudden tightening of the blood vessel (spasms). ?What increases the risk? ?A family history of PVD. ?Medical conditions, including: ?High cholesterol. ?Diabetes. ?High blood pressure. ?Heart disease. ?Past problems with blood clots. ?Past injury, such as burns or a broken bone. ?Other conditions, such as: ?Buerger's disease. This is caused by swollen or irritated blood vessels in your hands and feet. ?Arthritis. ?Birth defects that affect the arteries in your legs. ?Kidney disease. ?Using tobacco or nicotine products. ?Not getting enough exercise. ?Being very overweight (obese). ?Being 50 years old or older. ?What are the signs or symptoms? ?Cramps in your butt, legs, and feet. ?Pain and weakness in your legs when you are active that goes away when you rest. ?Leg pain when at rest. ?Leg numbness, tingling, or weakness. ?Coldness in a leg or foot, especially when compared with the other leg or foot. ?Skin or hair changes. These can include: ?Hair loss. ?Shiny skin. ?Pale or bluish skin. ?Thick toenails. ?Being unable to get or keep an erection. ?Tiredness (fatigue). ?Weak pulse or no pulse in the  feet. ?Wounds and sores on the toes, feet, or legs. These take longer to heal. ?How is this treated? ?Underlying causes are treated first. Other conditions, like diabetes, high cholesterol, and blood pressure, are also treated. Treatment may include: ?Lifestyle changes, such as: ?Quitting smoking. ?Getting regular exercise. ?Having a diet low in fat and cholesterol. ?Not drinking alcohol. ?Taking medicines, such as: ?Blood thinners. ?Medicines to improve blood flow. ?Medicines to improve your blood cholesterol. ?Procedures to: ?Open the arteries and restore blood flow. ?Insert a small mesh tube (stent) to keep a blocked vessel open. ?Create a new path for blood to flow to the body (peripheral bypass). ?Remove dead tissue from a wound. ?Remove an affected leg or arm. ?Follow these instructions at home: ?Medicines ?Take over-the-counter and prescription medicines only as told by your doctor. ?If you are taking blood thinners: ?Talk with your doctor before you take any medicines that have aspirin, or NSAIDs, such as ibuprofen. ?Take medicines exactly as told. Take them at the same time each day. ?Avoid doing things that could hurt or bruise you. Take action to prevent falls. ?Wear an alert bracelet or carry a card that shows you are taking blood thinners. ?Lifestyle ?  ?Get regular exercise. Ask your doctor about how to stay active. ?Talk with your doctor about keeping a healthy weight. If needed, ask about losing weight. ?Eat a diet that is low in fat and cholesterol. If you need help, talk with your doctor. ?Do not drink alcohol. ?Do not smoke or use any products that contain nicotine or tobacco. If you need help quitting, ask your   doctor. ?General instructions ?Take good care of your feet. To do this: ?Wear shoes that fit well and feel good. ?Check your feet often for any cuts or sores. ?Get a flu shot (influenza vaccine) each year. ?Keep all follow-up visits. ?Where to find more information ?Society for Vascular  Surgery: vascular.org ?American Heart Association: heart.org ?National Heart, Lung, and Blood Institute: nhlbi.nih.gov ?Contact a doctor if: ?You have cramps in your legs when you walk. ?You have leg pain when you rest. ?Your leg or foot feels cold. ?Your skin changes. ?You cannot get or keep an erection. ?You have cuts or sores on your legs or feet that do not heal. ?Get help right away if: ?You have sudden changes in the color and feeling of your arms or legs, such as: ?Your arm or leg turns cold, numb, and blue. ?Your arm or leg becomes red, warm, swollen, painful, or numb. ?You have any signs of a stroke. "BE FAST" is an easy way to remember the main warning signs: ?B - Balance. Dizziness, sudden trouble walking, or loss of balance. ?E - Eyes. Trouble seeing or a change in how you see. ?F - Face. Sudden weakness or loss of feeling of the face. The face or eyelid may droop on one side. ?A - Arms. Weakness or loss of feeling in an arm. This happens all of a sudden and most often on one side of the body. ?S - Speech. Sudden trouble speaking, slurred speech, or trouble understanding what people say. ?T - Time. Time to call emergency services. Write down what time symptoms started. ?You have other signs of a stroke, such as: ?A sudden, very bad headache with no known cause. ?Feeling like you may vomit (nausea). ?Vomiting. ?A seizure. ?You have chest pain or trouble breathing. ?These symptoms may be an emergency. Get help right away. Call your local emergency services (911 in the [place] ?Do not wait to see if the symptoms will go away. ?Do not drive yourself to the hospital. ?Summary ?Peripheral vascular disease (PVD) is a disease of the blood vessels. ?PVD affects the legs and feet the most. ?Symptoms may include leg pain or leg numbness, tingling, and weakness. ?Treatment may include lifestyle changes, medicines, and procedures. ?This information is not intended to replace advice given to you by your health care  provider. Make sure you discuss any questions you have with your health care provider. ?Document Revised: 03/07/2020 Document Reviewed: 03/07/2020 ?Elsevier Patient Education ? 2022 Elsevier Inc. ? ?

## 2021-11-06 ENCOUNTER — Encounter (HOSPITAL_COMMUNITY): Payer: Medicare Other

## 2021-11-07 ENCOUNTER — Telehealth: Payer: Self-pay | Admitting: Podiatry

## 2021-11-07 DIAGNOSIS — N189 Chronic kidney disease, unspecified: Secondary | ICD-10-CM | POA: Diagnosis not present

## 2021-11-07 DIAGNOSIS — N3020 Other chronic cystitis without hematuria: Secondary | ICD-10-CM | POA: Diagnosis not present

## 2021-11-07 DIAGNOSIS — R825 Elevated urine levels of drugs, medicaments and biological substances: Secondary | ICD-10-CM | POA: Diagnosis not present

## 2021-11-07 NOTE — Telephone Encounter (Signed)
[facility] called and stated they called the patient to get them scheduled for an ABI and patient declined

## 2021-11-10 ENCOUNTER — Emergency Department (HOSPITAL_COMMUNITY): Payer: Medicare Other

## 2021-11-10 ENCOUNTER — Encounter (HOSPITAL_COMMUNITY): Payer: Self-pay

## 2021-11-10 ENCOUNTER — Inpatient Hospital Stay (HOSPITAL_COMMUNITY): Payer: Medicare Other

## 2021-11-10 ENCOUNTER — Other Ambulatory Visit: Payer: Self-pay

## 2021-11-10 ENCOUNTER — Inpatient Hospital Stay (HOSPITAL_COMMUNITY)
Admission: EM | Admit: 2021-11-10 | Discharge: 2021-11-18 | DRG: 069 | Disposition: A | Discharge status: Skilled Nursing Facility | Payer: Medicare Other | Attending: Family Medicine | Admitting: Family Medicine

## 2021-11-10 DIAGNOSIS — E1129 Type 2 diabetes mellitus with other diabetic kidney complication: Secondary | ICD-10-CM | POA: Diagnosis not present

## 2021-11-10 DIAGNOSIS — Z86718 Personal history of other venous thrombosis and embolism: Secondary | ICD-10-CM | POA: Diagnosis not present

## 2021-11-10 DIAGNOSIS — R29818 Other symptoms and signs involving the nervous system: Secondary | ICD-10-CM | POA: Diagnosis not present

## 2021-11-10 DIAGNOSIS — G459 Transient cerebral ischemic attack, unspecified: Secondary | ICD-10-CM | POA: Diagnosis not present

## 2021-11-10 DIAGNOSIS — M1909 Primary osteoarthritis, other specified site: Secondary | ICD-10-CM | POA: Diagnosis present

## 2021-11-10 DIAGNOSIS — E1142 Type 2 diabetes mellitus with diabetic polyneuropathy: Secondary | ICD-10-CM | POA: Diagnosis present

## 2021-11-10 DIAGNOSIS — Z87440 Personal history of urinary (tract) infections: Secondary | ICD-10-CM | POA: Diagnosis not present

## 2021-11-10 DIAGNOSIS — E669 Obesity, unspecified: Secondary | ICD-10-CM | POA: Diagnosis present

## 2021-11-10 DIAGNOSIS — F0280 Dementia in other diseases classified elsewhere without behavioral disturbance: Secondary | ICD-10-CM | POA: Diagnosis not present

## 2021-11-10 DIAGNOSIS — E118 Type 2 diabetes mellitus with unspecified complications: Secondary | ICD-10-CM | POA: Diagnosis present

## 2021-11-10 DIAGNOSIS — I871 Compression of vein: Secondary | ICD-10-CM | POA: Diagnosis present

## 2021-11-10 DIAGNOSIS — B372 Candidiasis of skin and nail: Secondary | ICD-10-CM | POA: Diagnosis present

## 2021-11-10 DIAGNOSIS — M6281 Muscle weakness (generalized): Secondary | ICD-10-CM | POA: Diagnosis present

## 2021-11-10 DIAGNOSIS — M79603 Pain in arm, unspecified: Secondary | ICD-10-CM | POA: Diagnosis not present

## 2021-11-10 DIAGNOSIS — B9620 Unspecified Escherichia coli [E. coli] as the cause of diseases classified elsewhere: Secondary | ICD-10-CM | POA: Diagnosis present

## 2021-11-10 DIAGNOSIS — I129 Hypertensive chronic kidney disease with stage 1 through stage 4 chronic kidney disease, or unspecified chronic kidney disease: Secondary | ICD-10-CM | POA: Diagnosis present

## 2021-11-10 DIAGNOSIS — R29898 Other symptoms and signs involving the musculoskeletal system: Secondary | ICD-10-CM

## 2021-11-10 DIAGNOSIS — R410 Disorientation, unspecified: Secondary | ICD-10-CM | POA: Diagnosis not present

## 2021-11-10 DIAGNOSIS — Z20822 Contact with and (suspected) exposure to covid-19: Secondary | ICD-10-CM | POA: Diagnosis present

## 2021-11-10 DIAGNOSIS — N186 End stage renal disease: Secondary | ICD-10-CM | POA: Diagnosis not present

## 2021-11-10 DIAGNOSIS — Z794 Long term (current) use of insulin: Secondary | ICD-10-CM

## 2021-11-10 DIAGNOSIS — E039 Hypothyroidism, unspecified: Secondary | ICD-10-CM | POA: Diagnosis present

## 2021-11-10 DIAGNOSIS — R404 Transient alteration of awareness: Secondary | ICD-10-CM | POA: Diagnosis not present

## 2021-11-10 DIAGNOSIS — I480 Paroxysmal atrial fibrillation: Secondary | ICD-10-CM | POA: Diagnosis present

## 2021-11-10 DIAGNOSIS — T82898A Other specified complication of vascular prosthetic devices, implants and grafts, initial encounter: Secondary | ICD-10-CM | POA: Diagnosis not present

## 2021-11-10 DIAGNOSIS — Z9641 Presence of insulin pump (external) (internal): Secondary | ICD-10-CM | POA: Diagnosis present

## 2021-11-10 DIAGNOSIS — N184 Chronic kidney disease, stage 4 (severe): Secondary | ICD-10-CM | POA: Diagnosis present

## 2021-11-10 DIAGNOSIS — Z91040 Latex allergy status: Secondary | ICD-10-CM

## 2021-11-10 DIAGNOSIS — Z6839 Body mass index (BMI) 39.0-39.9, adult: Secondary | ICD-10-CM

## 2021-11-10 DIAGNOSIS — I959 Hypotension, unspecified: Secondary | ICD-10-CM | POA: Diagnosis not present

## 2021-11-10 DIAGNOSIS — R531 Weakness: Secondary | ICD-10-CM | POA: Diagnosis not present

## 2021-11-10 DIAGNOSIS — M40209 Unspecified kyphosis, site unspecified: Secondary | ICD-10-CM | POA: Diagnosis present

## 2021-11-10 DIAGNOSIS — M797 Fibromyalgia: Secondary | ICD-10-CM | POA: Diagnosis present

## 2021-11-10 DIAGNOSIS — E11319 Type 2 diabetes mellitus with unspecified diabetic retinopathy without macular edema: Secondary | ICD-10-CM | POA: Diagnosis present

## 2021-11-10 DIAGNOSIS — N390 Urinary tract infection, site not specified: Secondary | ICD-10-CM | POA: Diagnosis present

## 2021-11-10 DIAGNOSIS — E1122 Type 2 diabetes mellitus with diabetic chronic kidney disease: Secondary | ICD-10-CM | POA: Diagnosis present

## 2021-11-10 DIAGNOSIS — M19021 Primary osteoarthritis, right elbow: Secondary | ICD-10-CM | POA: Diagnosis not present

## 2021-11-10 DIAGNOSIS — L304 Erythema intertrigo: Secondary | ICD-10-CM | POA: Diagnosis present

## 2021-11-10 DIAGNOSIS — I4891 Unspecified atrial fibrillation: Secondary | ICD-10-CM | POA: Diagnosis not present

## 2021-11-10 DIAGNOSIS — R609 Edema, unspecified: Secondary | ICD-10-CM | POA: Diagnosis not present

## 2021-11-10 DIAGNOSIS — R9431 Abnormal electrocardiogram [ECG] [EKG]: Secondary | ICD-10-CM | POA: Diagnosis not present

## 2021-11-10 DIAGNOSIS — R4781 Slurred speech: Secondary | ICD-10-CM | POA: Diagnosis not present

## 2021-11-10 DIAGNOSIS — I10 Essential (primary) hypertension: Secondary | ICD-10-CM | POA: Diagnosis not present

## 2021-11-10 DIAGNOSIS — M25511 Pain in right shoulder: Secondary | ICD-10-CM | POA: Diagnosis not present

## 2021-11-10 DIAGNOSIS — G8191 Hemiplegia, unspecified affecting right dominant side: Secondary | ICD-10-CM | POA: Diagnosis present

## 2021-11-10 DIAGNOSIS — Z86711 Personal history of pulmonary embolism: Secondary | ICD-10-CM | POA: Diagnosis not present

## 2021-11-10 DIAGNOSIS — R4182 Altered mental status, unspecified: Secondary | ICD-10-CM | POA: Diagnosis not present

## 2021-11-10 DIAGNOSIS — Z7985 Long-term (current) use of injectable non-insulin antidiabetic drugs: Secondary | ICD-10-CM

## 2021-11-10 DIAGNOSIS — R600 Localized edema: Secondary | ICD-10-CM | POA: Diagnosis present

## 2021-11-10 DIAGNOSIS — R278 Other lack of coordination: Secondary | ICD-10-CM | POA: Diagnosis not present

## 2021-11-10 DIAGNOSIS — N185 Chronic kidney disease, stage 5: Secondary | ICD-10-CM | POA: Diagnosis not present

## 2021-11-10 DIAGNOSIS — Z79890 Hormone replacement therapy (postmenopausal): Secondary | ICD-10-CM

## 2021-11-10 DIAGNOSIS — Z87891 Personal history of nicotine dependence: Secondary | ICD-10-CM

## 2021-11-10 DIAGNOSIS — M79601 Pain in right arm: Secondary | ICD-10-CM | POA: Diagnosis not present

## 2021-11-10 DIAGNOSIS — M7989 Other specified soft tissue disorders: Secondary | ICD-10-CM

## 2021-11-10 DIAGNOSIS — J9601 Acute respiratory failure with hypoxia: Secondary | ICD-10-CM | POA: Diagnosis not present

## 2021-11-10 DIAGNOSIS — G4733 Obstructive sleep apnea (adult) (pediatric): Secondary | ICD-10-CM | POA: Diagnosis present

## 2021-11-10 DIAGNOSIS — G309 Alzheimer's disease, unspecified: Secondary | ICD-10-CM | POA: Diagnosis present

## 2021-11-10 DIAGNOSIS — M79644 Pain in right finger(s): Secondary | ICD-10-CM

## 2021-11-10 DIAGNOSIS — Z888 Allergy status to other drugs, medicaments and biological substances status: Secondary | ICD-10-CM

## 2021-11-10 DIAGNOSIS — Z79891 Long term (current) use of opiate analgesic: Secondary | ICD-10-CM

## 2021-11-10 DIAGNOSIS — Z79899 Other long term (current) drug therapy: Secondary | ICD-10-CM

## 2021-11-10 DIAGNOSIS — Z7401 Bed confinement status: Secondary | ICD-10-CM | POA: Diagnosis not present

## 2021-11-10 DIAGNOSIS — I6782 Cerebral ischemia: Secondary | ICD-10-CM | POA: Diagnosis not present

## 2021-11-10 LAB — COMPREHENSIVE METABOLIC PANEL
ALT: 9 U/L (ref 0–44)
AST: 11 U/L — ABNORMAL LOW (ref 15–41)
Albumin: 3 g/dL — ABNORMAL LOW (ref 3.5–5.0)
Alkaline Phosphatase: 50 U/L (ref 38–126)
Anion gap: 11 (ref 5–15)
BUN: 82 mg/dL — ABNORMAL HIGH (ref 8–23)
CO2: 25 mmol/L (ref 22–32)
Calcium: 9.1 mg/dL (ref 8.9–10.3)
Chloride: 104 mmol/L (ref 98–111)
Creatinine, Ser: 4.06 mg/dL — ABNORMAL HIGH (ref 0.61–1.24)
GFR, Estimated: 14 mL/min — ABNORMAL LOW (ref >60)
Glucose, Bld: 153 mg/dL — ABNORMAL HIGH (ref 70–99)
Potassium: 4.4 mmol/L (ref 3.5–5.1)
Sodium: 140 mmol/L (ref 135–145)
Total Bilirubin: 0.4 mg/dL (ref 0.3–1.2)
Total Protein: 6.7 g/dL (ref 6.5–8.1)

## 2021-11-10 LAB — URINALYSIS, ROUTINE W REFLEX MICROSCOPIC
Bilirubin Urine: NEGATIVE
Glucose, UA: NEGATIVE mg/dL
Ketones, ur: NEGATIVE mg/dL
Nitrite: NEGATIVE
Protein, ur: NEGATIVE mg/dL
Specific Gravity, Urine: 1.012 (ref 1.005–1.030)
WBC, UA: >50 WBC/hpf — ABNORMAL HIGH (ref 0–5)
pH: 5 (ref 5.0–8.0)

## 2021-11-10 LAB — CBC WITH DIFFERENTIAL/PLATELET
Abs Immature Granulocytes: 0.04 10*3/uL (ref 0.00–0.07)
Basophils Absolute: 0.1 10*3/uL (ref 0.0–0.1)
Basophils Relative: 1 %
Eosinophils Absolute: 0.1 10*3/uL (ref 0.0–0.5)
Eosinophils Relative: 1 %
HCT: 37.6 % — ABNORMAL LOW (ref 39.0–52.0)
Hemoglobin: 11.8 g/dL — ABNORMAL LOW (ref 13.0–17.0)
Immature Granulocytes: 0 %
Lymphocytes Relative: 14 %
Lymphs Abs: 1.5 10*3/uL (ref 0.7–4.0)
MCH: 29.2 pg (ref 26.0–34.0)
MCHC: 31.4 g/dL (ref 30.0–36.0)
MCV: 93.1 fL (ref 80.0–100.0)
Monocytes Absolute: 2.3 10*3/uL — ABNORMAL HIGH (ref 0.1–1.0)
Monocytes Relative: 21 %
Neutro Abs: 6.8 10*3/uL (ref 1.7–7.7)
Neutrophils Relative %: 63 %
Platelets: 178 10*3/uL (ref 150–400)
RBC: 4.04 MIL/uL — ABNORMAL LOW (ref 4.22–5.81)
RDW: 13.8 % (ref 11.5–15.5)
WBC: 10.8 10*3/uL — ABNORMAL HIGH (ref 4.0–10.5)
nRBC: 0 % (ref 0.0–0.2)

## 2021-11-10 LAB — RESP PANEL BY RT-PCR (FLU A&B, COVID) ARPGX2
Influenza A by PCR: NEGATIVE
Influenza B by PCR: NEGATIVE
SARS Coronavirus 2 by RT PCR: NEGATIVE

## 2021-11-10 LAB — I-STAT CHEM 8, ED
BUN: 88 mg/dL — ABNORMAL HIGH (ref 8–23)
Calcium, Ion: 1.19 mmol/L (ref 1.15–1.40)
Chloride: 105 mmol/L (ref 98–111)
Creatinine, Ser: 4.4 mg/dL — ABNORMAL HIGH (ref 0.61–1.24)
Glucose, Bld: 146 mg/dL — ABNORMAL HIGH (ref 70–99)
HCT: 35 % — ABNORMAL LOW (ref 39.0–52.0)
Hemoglobin: 11.9 g/dL — ABNORMAL LOW (ref 13.0–17.0)
Potassium: 4.4 mmol/L (ref 3.5–5.1)
Sodium: 140 mmol/L (ref 135–145)
TCO2: 24 mmol/L (ref 22–32)

## 2021-11-10 LAB — TROPONIN I (HIGH SENSITIVITY)
Troponin I (High Sensitivity): 31 ng/L — ABNORMAL HIGH (ref <18)
Troponin I (High Sensitivity): 18 ng/L — ABNORMAL HIGH (ref <18)

## 2021-11-10 LAB — GLUCOSE, CAPILLARY: Glucose-Capillary: 241 mg/dL — ABNORMAL HIGH (ref 70–99)

## 2021-11-10 LAB — PROTIME-INR
INR: 1 (ref 0.8–1.2)
Prothrombin Time: 13.5 seconds (ref 11.4–15.2)

## 2021-11-10 LAB — HEMOGLOBIN A1C
Hgb A1c MFr Bld: 6 % — ABNORMAL HIGH (ref 4.8–5.6)
Mean Plasma Glucose: 125.5 mg/dL

## 2021-11-10 MED ORDER — INSULIN ASPART 100 UNIT/ML IJ SOLN
0.0000 [IU] | Freq: Every day | INTRAMUSCULAR | Status: DC
  Administered 2021-11-10 – 2021-11-17 (×6): 2 [IU] via SUBCUTANEOUS

## 2021-11-10 MED ORDER — HEPARIN SODIUM (PORCINE) 5000 UNIT/ML IJ SOLN
5000.0000 [IU] | Freq: Three times a day (TID) | INTRAMUSCULAR | Status: DC
  Administered 2021-11-10 – 2021-11-17 (×22): 5000 [IU] via SUBCUTANEOUS
  Filled 2021-11-10 (×22): qty 1

## 2021-11-10 MED ORDER — SODIUM CHLORIDE 0.9 % IV SOLN: 1.0000 g | INTRAVENOUS | Status: DC

## 2021-11-10 MED ORDER — ACETAMINOPHEN 325 MG PO TABS
650.0000 mg | ORAL_TABLET | Freq: Four times a day (QID) | ORAL | Status: DC | PRN
  Administered 2021-11-10 – 2021-11-14 (×5): 650 mg via ORAL
  Filled 2021-11-10 (×5): qty 2

## 2021-11-10 MED ORDER — INSULIN ASPART 100 UNIT/ML IJ SOLN
0.0000 [IU] | Freq: Three times a day (TID) | INTRAMUSCULAR | Status: DC
  Administered 2021-11-11: 2 [IU] via SUBCUTANEOUS
  Administered 2021-11-11: 3 [IU] via SUBCUTANEOUS
  Administered 2021-11-11: 2 [IU] via SUBCUTANEOUS
  Administered 2021-11-12 – 2021-11-13 (×4): 3 [IU] via SUBCUTANEOUS
  Administered 2021-11-13 (×2): 2 [IU] via SUBCUTANEOUS
  Administered 2021-11-14: 3 [IU] via SUBCUTANEOUS
  Administered 2021-11-14 (×2): 2 [IU] via SUBCUTANEOUS
  Administered 2021-11-15 (×2): 3 [IU] via SUBCUTANEOUS
  Administered 2021-11-15 – 2021-11-16 (×2): 2 [IU] via SUBCUTANEOUS
  Administered 2021-11-16 – 2021-11-17 (×5): 3 [IU] via SUBCUTANEOUS

## 2021-11-10 NOTE — ED Notes (Signed)
This RN notified by MRI that unable to complete MRI secondary to Kyphosis. This RN notified attending Avon Gully MD.

## 2021-11-10 NOTE — ED Triage Notes (Signed)
Past 2 days with slurred speech and weakness contacted his MD and they thought he had a TIA and want an MRI

## 2021-11-10 NOTE — H&P (Signed)
History and Physical    Daniel Ochoa FVC:[id number] DOB: August 14, 1941 DOA: 11/10/2021  PCP: Donnajean Lopes, MD   Chief Complaint: Weakness, unilateral; concurrent ambulatory dysfunction  HPI: Daniel Ochoa is a 81 y.o. male with medical history significant of IDDM2 on insulin pump, lumbago, Alzheimer's who presents with transient weakness and ambulatory dysfunction. PCP sent to ED for workup for TIA. Recent history of UTI s/p antibiotics 2-3 weeks ago per wife at bedside. Personal history and ROS limited due to patient's Alzheimer's. Neurology sidelined in the ED, given atypical symptoms will hold off on formal consult pending MRI, CT head unremarkable for acute findings.  Assessment/Plan Principal Problem:   TIA (transient ischemic attack) Active Problems:   Diabetes mellitus type 2 with complications (HCC)   Chronic kidney disease, stage IV (severe) (HCC)   Alzheimer's disease (HCC)   Hypothyroidism   Muscle weakness   Paroxysmal atrial fibrillation (HCC)   Diabetic retinopathy associated with type 2 diabetes mellitus (HCC)   Questionable UTI, recurrent, POA  - Follow cultures, start ceftriaxone x3 days  - Recent UTI (cultures unavailable on epic)  - Finished unspecified antiobiotics 3-4 weeks ago per wife, seen by urology at that time  - No recent instrumentation - follow culture and de-escalate as possible  Rule out TIA vs CVA  - Atypical weakness, RLE weakness at home, LLE weakness here  - CT head unremarkable for acute findings  - MRI pending  - Consult neuro vs surgery pending MRI findings  IDDM2 on insulin pump - Transition to sliding scale for now  CKD4, stable - Follow repeat labs, hold nephrotoxins  DVT prophylaxis: Heparin  Code Status: Full  Family Communication: Wife at bedside  Status is: Inpatient  Dispo: The patient is from: Home              Anticipated d/c is to: Pending              Anticipated d/c date is: 24-48h              Patient  currently NOT medically stable for discharge  Consultants:  None  Procedures:  None   Past Medical History:  Diagnosis Date   Acute deep vein thrombosis (DVT) of tibial vein of right lower extremity ([place]) 04/13/2014   Acute pulmonary embolism ([place]) 04/15/2014   Arthritis    Chronic fatigue    Chronic kidney disease, stage IV (severe) ([place])    Complication of anesthesia    one surgery took 4 people to hold him down 2000   Constipation    Diabetic peripheral neuropathy ([place]) 10/26/2015   DM (diabetes mellitus), type 2, uncontrolled, with renal complications    [facility]   DVT (deep venous thrombosis) ([place])    RLE DVT with intermediate risk VQ scan for PE 03/2014   Dyspnea    Essential hypertension    Fibromyalgia    Hypothyroidism    Hypoxia 04/13/2014   Neuropathy    Obesity    OSA on CPAP    Pulmonary embolism ([place]) 04/13/2014   VQ 04/13/14 intermediate assoc with R DVT > on coumadin since Echo 04/14/14 > PA peak pressure: 89 mm Hg (S).     Past Surgical History:  Procedure Laterality Date   AV FISTULA PLACEMENT Left 04/18/2017   Procedure: ARTERIOVENOUS (AV) FISTULA CREATION;  Surgeon: Angelia Mould, MD;  Location: [place];  Service: Vascular;  Laterality: Left;   [place] Right 11/14/2020   Procedure: BASCILIC VEIN  TRANSPOSITION 1ST STAGE RIGHT;  Surgeon: Angelia Mould, MD;  Location: [place];  Service: Vascular;  Laterality: Right;   [place] Right 01/05/2021   Procedure: RIGHT SECOND STAGE [place];  Surgeon: Serafina Mitchell, MD;  Location[facility];  Service: Vascular;  Laterality: Right;   CERVICAL LAMINECTOMY     LIGATION OF ARTERIOVENOUS  FISTULA Left 06/01/2021   Procedure: LIGATION OF LEFT ARM FISTULA;  Surgeon: Serafina Mitchell, MD;  Location: [facility];  Service: Vascular;  Laterality: Left;   LUMBAR FUSION     x 2   ROTATOR CUFF REPAIR Right    THYROIDECTOMY, PARTIAL       reports that he has  quit smoking. His smoking use included pipe. He has never used smokeless tobacco. He reports that he does not drink alcohol and does not use drugs.  Allergies  Allergen Reactions   Statins Other (See Comments)    Elevated CK level   Lisinopril     Other reaction(s): stopped by pulmonology  to help with breathing   Latex Rash    Severe blistery rash    Family History  Problem Relation Age of Onset   Cancer Father     Prior to Admission medications   Medication Sig Start Date End Date Taking? Authorizing Provider  Accu-Chek Softclix Lancets lancets USE TO CHECK BLOOD SUGAR UP TO TID E11.29 12/21/19   [provider]  amLODipine (NORVASC) 5 MG tablet Take 5 mg by mouth daily.  03/18/15   [provider]  amoxicillin-clavulanate (AUGMENTIN) 250-125 MG tablet Take 1 tablet by mouth 2 (two) times daily. 06/27/21   [provider]  atenolol (TENORMIN) 50 MG tablet Take 50 mg by mouth 2 (two) times daily.  01/31/14   [provider]  Ocie Bob AG HOME TEST KIT Use as Directed on the Package 06/30/21   [provider]  Calcium-Vitamin D-Vitamin K ([facility] CALCIUM SOFT CHEWS) 650-12.5-40 MG-MCG-MCG CHEW Chew 1-2 tablets by mouth 2 (two) times daily. Take 1 tablet in the morning & take 2 tablets at night    [provider]  carboxymethylcellulose (REFRESH PLUS) 0.5 % SOLN Place 1 drop into both eyes 3 (three) times daily as needed (allergy/irritated eyes.).    [provider]  clonazePAM (KLONOPIN) 1 MG tablet Take 1 mg by mouth at bedtime.  02/18/14   [provider]  famotidine (PEPCID) 40 MG tablet Take 40 mg by mouth at bedtime.  03/17/14   [provider]  FREESTYLE TEST STRIPS test strip USE AS DIRECTED TO TEST BLOOD SUGAR 5 TIMES PER DAY. DX: E11.65 06/06/18   [provider]  furosemide (LASIX) 40 MG tablet Take 40-80 mg by mouth See admin instructions. Take 2 tablets (80 mg) by mouth in the morning & take 1  tablet (40 mg) by mouth in the afternoon. 10/20/14   [provider]  HUMALOG 100 UNIT/ML injection Inject into the skin daily. Via insulin pump 03/21/21   [provider]  HYDROcodone-acetaminophen (NORCO/VICODIN) 5-325 MG tablet Take 1-2 tablets by mouth See admin instructions. Take 1 tablet in the morning & take 2 tablets in the evening 06/27/18   [provider]  Insulin Human (INSULIN PUMP) SOLN Inject into the skin. Humalog 100 unit/ml injection    [provider]  ipratropium (ATROVENT) 0.03 % nasal spray Place 2 sprays into both nostrils daily as needed for rhinitis.    [provider]  Lancets Misc. (ACCU-CHEK  FASTCLIX LANCET) KIT USE TO CHECK BLOOD SUGAR UP TO TID E11.29 12/23/19   [provider]  Semaglutide, 1 MG/DOSE, (OZEMPIC, 1 MG/DOSE,) 4 MG/3ML SOPN inject 50m 05/11/21   [provider]  SYNTHROID 200 MCG tablet Take 200-300 mcg by mouth See admin instructions. Take 1 tablet (200 mcg) by mouth daily except on Sundays take 1.5 tablets (300 mcg) 01/12/14   [provider]  vitamin C (ASCORBIC ACID) 250 MG tablet Take 250-500 mg by mouth See admin instructions. Take 1 tablet (250 mg) by mouth in the morning and take 2 tablets (500 mg) by mouth at night    [provider]    Physical Exam: Vitals:   11/10/21 1430 11/10/21 1545 11/10/21 1600 11/10/21 1615  BP: (!) 117/51 (!) 120/51 (!) 111/52 (!) 116/47  Pulse: 61 62 61 64  Resp: _0 Temp:      SpO2: 96% 97% 97% 96%  Weight:      Height:        Constitutional: NAD, calm, comfortable Vitals:   11/10/21 1430 11/10/21 1545 11/10/21 1600 11/10/21 1615  BP: (!) 117/51 (!) 120/51 (!) 111/52 (!) 116/47  Pulse: 61 62 61 64  Resp: _1 Temp:      SpO2: 96% 97% 97% 96%  Weight:      Height:       General:  Pleasantly resting in bed, No acute distress. HEENT:  Normocephalic atraumatic.  Sclerae nonicteric, noninjected.  Extraocular movements  intact bilaterally. Neck:  Without mass or deformity.  Trachea is midline. Lungs:  Clear to auscultate bilaterally without rhonchi, wheeze, or rales. Heart:  Regular rate and rhythm.  Without murmurs, rubs, or gallops. Abdomen:  Soft, nontender, nondistended.  Without guarding or rebound. Extremities: Without cyanosis, clubbing, edema, or obvious deformity. Vascular:  Dorsalis pedis and posterior tibial pulses palpable bilaterally. Skin:  Warm and dry, no erythema, no ulcerations.  Labs on Admission: I have personally reviewed following labs and imaging studies  CBC: Recent Labs  Lab 11/10/21 1406 11/10/21 1409  WBC  --  10.8*  NEUTROABS  --  6.8  HGB 11.9* 11.8*  HCT 35.0* 37.6*  MCV  --  93.1  PLT  --  1680  Basic Metabolic Panel: Recent Labs  Lab 11/10/21 1406 11/10/21 1409  NA 140 140  K 4.4 4.4  CL 105 104  CO2  --  25  GLUCOSE 146* 153*  BUN 88* 82*  CREATININE 4.40* 4.06*  CALCIUM  --  9.1   GFR: Estimated Creatinine Clearance: 17.9 mL/min (A) (by C-G formula based on SCr of 4.06 mg/dL (H)). Liver Function Tests: Recent Labs  Lab 11/10/21 1409  AST 11*  ALT 9  ALKPHOS 50  BILITOT 0.4  PROT 6.7  ALBUMIN 3.0*   No results for input(s): LIPASE, AMYLASE in the last 168 hours. No results for input(s): AMMONIA in the last 168 hours. Coagulation Profile: Recent Labs  Lab 11/10/21 1409  INR 1.0   Cardiac Enzymes: No results for input(s): CKTOTAL, CKMB, CKMBINDEX, TROPONINI in the last 168 hours. BNP (last 3 results) No results for input(s): PROBNP in the last 8760 hours. HbA1C: No results for input(s): HGBA1C in the last 72 hours. CBG: No results for input(s): GLUCAP in the last 168 hours. Lipid Profile: No results for input(s): CHOL, HDL, LDLCALC, TRIG, CHOLHDL, LDLDIRECT in the last 72 hours. Thyroid Function Tests: No results for input(s): TSH, T4TOTAL, FREET4, T3FREE, THYROIDAB  in the last 72 hours. Anemia Panel: No results for input(s):  VITAMINB12, FOLATE, FERRITIN, TIBC, IRON, RETICCTPCT in the last 72 hours. Urine analysis:    Component Value Date/Time   COLORURINE YELLOW 11/10/2021 1639   APPEARANCEUR HAZY (A) 11/10/2021 1639   LABSPEC 1.012 11/10/2021 1639   PHURINE 5.0 11/10/2021 1639   GLUCOSEU NEGATIVE 11/10/2021 1639   HGBUR SMALL (A) 11/10/2021 1639   BILIRUBINUR NEGATIVE 11/10/2021 1639   KETONESUR NEGATIVE 11/10/2021 1639   PROTEINUR NEGATIVE 11/10/2021 1639   UROBILINOGEN 0.2 04/13/2014 2334   NITRITE NEGATIVE 11/10/2021 1639   LEUKOCYTESUR LARGE (A) 11/10/2021 1639    Radiological Exams on Admission: DG Chest 2 View  Result Date: 11/10/2021 CLINICAL DATA:  Altered mental status EXAM: CHEST - 2 VIEW COMPARISON:  2015 FINDINGS: Shallow inspiration with low lung volumes. Patchy density at the left lung base. No significant pleural effusion. No pneumothorax. Cardiomediastinal contours are within normal limits. No acute osseous abnormality. Surgical clips overlie the right superior mediastinum or lower neck. IMPRESSION: Patchy atelectasis/consolidation at the left lung base. Electronically Signed   By: Macy Mis M.D.   On: 11/10/2021 13:50   DG Shoulder Right  Result Date: 11/10/2021 CLINICAL DATA:  Right elbow and shoulder pain. EXAM: RIGHT SHOULDER - 2+ VIEW COMPARISON:  No pertinent prior exams available for comparison. FINDINGS: There is normal bony alignment. No evidence of acute osseous or articular abnormality. Glenohumeral joint osteoarthrosis. Surgical clips within the right lower neck. IMPRESSION: No evidence of acute osseous or articular abnormality. Clinical humeral joint osteoarthrosis. Electronically Signed   By: Kellie Simmering D.O.   On: 11/10/2021 13:50   DG Elbow Complete Right  Result Date: 11/10/2021 CLINICAL DATA:  RIGHT elbow and shoulder pain, altered mental status in a 81 year old male. EXAM: RIGHT ELBOW - COMPLETE 3+ VIEW COMPARISON:  None FINDINGS: No visible fracture. Degenerative  changes about the elbow. Question mild elevation of the anterior fat pad but without distension/anterior bowing. No posterior fat pad. IMPRESSION: No visible fracture. Question mild elevation of the anterior fat pad but without anterior bowing of the fat pad. Favored to be related to degenerative changes, difficult to entirely exclude the possibility of occult fracture, most commonly of the radial head in a skeletally mature patient. No posterior fat pad elevation, correlate with any point tenderness over the radial head. Electronically Signed   By: Zetta Bills M.D.   On: 11/10/2021 13:52   CT HEAD WO CONTRAST (5MM)  Result Date: 11/10/2021 CLINICAL DATA:  Neuro deficit, acute, stroke suspected left leg weakness / R arm weakness / slurred speech for 2 days EXAM: CT HEAD WITHOUT CONTRAST TECHNIQUE: Contiguous axial images were obtained from the base of the skull through the vertex without intravenous contrast. RADIATION DOSE REDUCTION: This exam was performed according to the departmental dose-optimization program which includes automated exposure control, adjustment of the mA and/or kV according to patient size and/or use of iterative reconstruction technique. COMPARISON:  None. FINDINGS: Suboptimal evaluation due to artifact. Brain: There is no acute intracranial hemorrhage, mass effect, or edema. Gray-white differentiation is preserved. There is no extra-axial fluid collection. Ventricles and sulci are within normal limits in size and configuration. Patchy low-density in the supratentorial white matter is nonspecific but may reflect mild chronic microvascular ischemic changes. Age-indeterminate small vessel infarct at the right caudothalamic groove. Vascular: There is atherosclerotic calcification at the skull base. Skull: Prior suboccipital craniectomy. Sinuses/Orbits: No acute finding. Other: None. IMPRESSION: Degraded by artifact. No acute intracranial hemorrhage or  mass effect. Age-indeterminate small  vessel infarct at the right caudothalamic groove. Mild chronic microvascular ischemic changes. Electronically Signed   By: Macy Mis M.D.   On: 11/10/2021 13:21    Little Ishikawa DO [facility] For contact please use secure messenger on Epic  If 7PM-7AM, please contact night-coverage located on [email or web address]   11/10/2021, 5:11 PM

## 2021-11-10 NOTE — Plan of Care (Signed)
  Problem: Education: Goal: Knowledge of General Education information will improve Description Including pain rating scale, medication(s)/side effects and non-pharmacologic comfort measures Outcome: Progressing   

## 2021-11-10 NOTE — ED Notes (Signed)
Back from scans

## 2021-11-10 NOTE — ED Provider Notes (Signed)
[facility] EMERGENCY DEPARTMENT Provider Note   CSN: [id number] Arrival date & time: 11/10/21  1220     History  Chief Complaint  Patient presents with   Weakness    Daniel Ochoa is a 81 y.o. male.   Weakness  Patient is a 81 year old gentleman with past medical history significant for Alzheimer's, DM 2, PAF, ESRD not yet on dialysis  Patient has a history of Alzheimer's per wife and patient it is mild however he is not a particularly gifted historian  Per patient's wife who is at bedside patient has been experiencing right arm and right leg weakness since 2 days ago 2/22 in the evening sometime.  They are not certain exactly when the symptoms started.  On my initial exam the patient is alone in the room and informs me that his right arm hurts.  During my exam I uncovered that patient is unable to lift his left leg off the bed.  He states that this started 2 days ago.  And his wife seem to disagree about his symptoms.  However it is clear that he has been having significant trouble walking because of weakness.  He has a history of low back pain he states is not particularly worse today than usual.  He denies any bowel or bladder incontinence.  No urinary frequency urgency dysuria or blood in his urine that he can see.  He denies any chest pain or difficulty breathing.  It seems that he has been without any visual changes or slurred speech although his wife states that she feels his speech is more hesitant and halting than usual    Home Medications Prior to Admission medications   Medication Sig Start Date End Date Taking? Authorizing Provider  Accu-Chek Softclix Lancets lancets USE TO CHECK BLOOD SUGAR UP TO TID E11.29 12/21/19   [provider]  amLODipine (NORVASC) 5 MG tablet Take 5 mg by mouth daily.  03/18/15   [provider]  amoxicillin-clavulanate (AUGMENTIN) 250-125 MG tablet Take 1 tablet by mouth 2 (two) times daily. 06/27/21    [provider]  atenolol (TENORMIN) 50 MG tablet Take 50 mg by mouth 2 (two) times daily.  01/31/14   [provider]  Ocie Bob AG HOME TEST KIT Use as Directed on the Package 06/30/21   [provider]  Calcium-Vitamin D-Vitamin K ([employer] CALCIUM SOFT CHEWS) 650-12.5-40 MG-MCG-MCG CHEW Chew 1-2 tablets by mouth 2 (two) times daily. Take 1 tablet in the morning & take 2 tablets at night    [provider]  carboxymethylcellulose (REFRESH PLUS) 0.5 % SOLN Place 1 drop into both eyes 3 (three) times daily as needed (allergy/irritated eyes.).    [provider]  clonazePAM (KLONOPIN) 1 MG tablet Take 1 mg by mouth at bedtime.  02/18/14   [provider]  famotidine (PEPCID) 40 MG tablet Take 40 mg by mouth at bedtime.  03/17/14   [provider]  FREESTYLE TEST STRIPS test strip USE AS DIRECTED TO TEST BLOOD SUGAR 5 TIMES PER DAY. DX: E11.65 06/06/18   [provider]  furosemide (LASIX) 40 MG tablet Take 40-80 mg by mouth See admin instructions. Take 2 tablets (80 mg) by mouth in the morning & take 1 tablet (40 mg) by mouth in the afternoon. 10/20/14   [provider]  HUMALOG 100 UNIT/ML injection Inject into the skin daily. Via insulin pump 03/21/21   [provider]  HYDROcodone-acetaminophen (NORCO/VICODIN) 5-325 MG tablet Take  1-2 tablets by mouth See admin instructions. Take 1 tablet in the morning & take 2 tablets in the evening 06/27/18   [provider]  Insulin Human (INSULIN PUMP) SOLN Inject into the skin. Humalog 100 unit/ml injection    [provider]  ipratropium (ATROVENT) 0.03 % nasal spray Place 2 sprays into both nostrils daily as needed for rhinitis.    [provider]  Lancets Misc. (ACCU-CHEK FASTCLIX LANCET) KIT USE TO CHECK BLOOD SUGAR UP TO TID E11.29 12/23/19   [provider]  Semaglutide, 1 MG/DOSE, (OZEMPIC, 1 MG/DOSE,) 4 MG/3ML SOPN inject 28m 05/11/21    [provider]  SYNTHROID 200 MCG tablet Take 200-300 mcg by mouth See admin instructions. Take 1 tablet (200 mcg) by mouth daily except on Sundays take 1.5 tablets (300 mcg) 01/12/14   [provider]  vitamin C (ASCORBIC ACID) 250 MG tablet Take 250-500 mg by mouth See admin instructions. Take 1 tablet (250 mg) by mouth in the morning and take 2 tablets (500 mg) by mouth at night    [provider]      Allergies    Statins, Lisinopril, and Latex    Review of Systems   Review of Systems  Neurological:  Positive for weakness.   Physical Exam Updated Vital Signs BP (!) 130/52    Pulse 64    Temp 98.4 F (36.9 C)    Resp 16    Ht _0  (1.702 m)    Wt 114.8 kg    SpO2 95%    BMI 39.64 kg/m  Physical Exam Vitals and nursing note reviewed.  Constitutional:      General: He is not in acute distress.    Comments: Elderly 81year old male in no acute distress. Chronically unwell appearing.   HENT:     Head: Normocephalic and atraumatic.     Nose: Nose normal.  Eyes:     General: No scleral icterus. Cardiovascular:     Rate and Rhythm: Normal rate and regular rhythm.     Pulses: Normal pulses.     Heart sounds: Normal heart sounds.  Pulmonary:     Effort: Pulmonary effort is normal. No respiratory distress.     Breath sounds: No wheezing.  Abdominal:     Palpations: Abdomen is soft.     Tenderness: There is no abdominal tenderness.  Musculoskeletal:     Cervical back: Normal range of motion.     Right lower leg: No edema.     Left lower leg: No edema.  Skin:    General: Skin is warm and dry.     Capillary Refill: Capillary refill takes less than 2 seconds.  Neurological:     Mental Status: He is alert. Mental status is at baseline.     Comments: Left leg weakness unable to lift left leg off bed.  Right leg able to lift off bed able to flex and extend ankle.  Right upper extremity seems to be uncomfortable to move but bilateral grip strength is  symmetric.  Flexion is some and extension at the elbow of the right upper extremity seems to be the nidus of pain.  PERRLA, smile symmetric, sensation all 4 extremities and face  Psychiatric:        Mood and Affect: Mood normal.        Behavior: Behavior normal.    ED Results / Procedures / Treatments   Labs (all labs ordered are listed, but only abnormal results  are displayed) Labs Reviewed  CBC WITH DIFFERENTIAL/PLATELET - Abnormal; Notable for the following components:      Result Value   WBC 10.8 (*)    RBC 4.04 (*)    Hemoglobin 11.8 (*)    HCT 37.6 (*)    Monocytes Absolute 2.3 (*)    All other components within normal limits  COMPREHENSIVE METABOLIC PANEL - Abnormal; Notable for the following components:   Glucose, Bld 153 (*)    BUN 82 (*)    Creatinine, Ser 4.06 (*)    Albumin 3.0 (*)    AST 11 (*)    GFR, Estimated 14 (*)    All other components within normal limits  URINALYSIS, ROUTINE W REFLEX MICROSCOPIC - Abnormal; Notable for the following components:   APPearance HAZY (*)    Hgb urine dipstick SMALL (*)    Leukocytes,Ua LARGE (*)    WBC, UA >50 (*)    Bacteria, UA MANY (*)    All other components within normal limits  I-STAT CHEM 8, ED - Abnormal; Notable for the following components:   BUN 88 (*)    Creatinine, Ser 4.40 (*)    Glucose, Bld 146 (*)    Hemoglobin 11.9 (*)    HCT 35.0 (*)    All other components within normal limits  TROPONIN I (HIGH SENSITIVITY) - Abnormal; Notable for the following components:   Troponin I (High Sensitivity) 31 (*)    All other components within normal limits  TROPONIN I (HIGH SENSITIVITY) - Abnormal; Notable for the following components:   Troponin I (High Sensitivity) 18 (*)    All other components within normal limits  RESP PANEL BY RT-PCR (FLU A&B, COVID) [id number]  URINE CULTURE  PROTIME-INR  CBC  CREATININE, SERUM  BASIC METABOLIC PANEL  CBC  HEMOGLOBIN A1C    EKG None  Radiology DG Chest 2  View  Result Date: 11/10/2021 CLINICAL DATA:  Altered mental status EXAM: CHEST - 2 VIEW COMPARISON:  2015 FINDINGS: Shallow inspiration with low lung volumes. Patchy density at the left lung base. No significant pleural effusion. No pneumothorax. Cardiomediastinal contours are within normal limits. No acute osseous abnormality. Surgical clips overlie the right superior mediastinum or lower neck. IMPRESSION: Patchy atelectasis/consolidation at the left lung base. Electronically Signed   By: Macy Mis M.D.   On: 11/10/2021 13:50   DG Shoulder Right  Result Date: 11/10/2021 CLINICAL DATA:  Right elbow and shoulder pain. EXAM: RIGHT SHOULDER - 2+ VIEW COMPARISON:  No pertinent prior exams available for comparison. FINDINGS: There is normal bony alignment. No evidence of acute osseous or articular abnormality. Glenohumeral joint osteoarthrosis. Surgical clips within the right lower neck. IMPRESSION: No evidence of acute osseous or articular abnormality. Clinical humeral joint osteoarthrosis. Electronically Signed   By: Kellie Simmering D.O.   On: 11/10/2021 13:50   DG Elbow Complete Right  Result Date: 11/10/2021 CLINICAL DATA:  RIGHT elbow and shoulder pain, altered mental status in a 81 year old male. EXAM: RIGHT ELBOW - COMPLETE 3+ VIEW COMPARISON:  None FINDINGS: No visible fracture. Degenerative changes about the elbow. Question mild elevation of the anterior fat pad but without distension/anterior bowing. No posterior fat pad. IMPRESSION: No visible fracture. Question mild elevation of the anterior fat pad but without anterior bowing of the fat pad. Favored to be related to degenerative changes, difficult to entirely exclude the possibility of occult fracture, most commonly of the radial head in a skeletally mature patient. No posterior fat pad elevation, correlate  with any point tenderness over the radial head. Electronically Signed   By: Zetta Bills M.D.   On: 11/10/2021 13:52   CT HEAD WO  CONTRAST (5MM)  Result Date: 11/10/2021 CLINICAL DATA:  Neuro deficit, acute, stroke suspected left leg weakness / R arm weakness / slurred speech for 2 days EXAM: CT HEAD WITHOUT CONTRAST TECHNIQUE: Contiguous axial images were obtained from the base of the skull through the vertex without intravenous contrast. RADIATION DOSE REDUCTION: This exam was performed according to the departmental dose-optimization program which includes automated exposure control, adjustment of the mA and/or kV according to patient size and/or use of iterative reconstruction technique. COMPARISON:  None. FINDINGS: Suboptimal evaluation due to artifact. Brain: There is no acute intracranial hemorrhage, mass effect, or edema. Gray-white differentiation is preserved. There is no extra-axial fluid collection. Ventricles and sulci are within normal limits in size and configuration. Patchy low-density in the supratentorial white matter is nonspecific but may reflect mild chronic microvascular ischemic changes. Age-indeterminate small vessel infarct at the right caudothalamic groove. Vascular: There is atherosclerotic calcification at the skull base. Skull: Prior suboccipital craniectomy. Sinuses/Orbits: No acute finding. Other: None. IMPRESSION: Degraded by artifact. No acute intracranial hemorrhage or mass effect. Age-indeterminate small vessel infarct at the right caudothalamic groove. Mild chronic microvascular ischemic changes. Electronically Signed   By: Macy Mis M.D.   On: 11/10/2021 13:21    Procedures .Critical Care Performed by: Tedd Sias, PA Authorized by: Tedd Sias, PA   Critical care provider statement:    Critical care time (minutes):  30   Critical care was necessary to treat or prevent imminent or life-threatening deterioration of the following conditions: neurologic deficits - likely stroke / TIA.   Critical care was time spent personally by me on the following activities:  Development of  treatment plan with patient or surrogate, discussions with consultants, evaluation of patient's response to treatment, examination of patient, ordering and review of laboratory studies, ordering and review of radiographic studies, ordering and performing treatments and interventions, pulse oximetry, re-evaluation of patient's condition and review of old charts    Medications Ordered in ED Medications  acetaminophen (TYLENOL) tablet 650 mg (650 mg Oral Given 11/10/21 1651)  heparin injection 5,000 Units (has no administration in time range)  cefTRIAXone (ROCEPHIN) 1 g in sodium chloride 0.9 % 100 mL IVPB (has no administration in time range)  insulin aspart (novoLOG) injection 0-9 Units (has no administration in time range)  insulin aspart (novoLOG) injection 0-5 Units (has no administration in time range)    ED Course/ Medical Decision Making/ A&P Clinical Course as of 11/10/21 1840  Fri Nov 10, 2021  1441 Discussed with Dr. Cheral Marker of neurology.  Patient has significant left lower extremity weakness  Will require admission for physical therapy at the very least.  Per Dr. Lisabeth Devoid if MRI brain without is negative [WF]  1608 Chest x-ray with questionable atelectasis.  Patient without fever denies any coughing or shortness of breath.  Doubt pneumonia  X-ray of right elbow and shoulder without fracture.  Radiology questions possibility of elbow fracture however there is no point tenderness but more diffuse discomfort with palpation of elbow. [WF]  1609 CT head independently reviewed by me.  Also discussed with Dr. Cheral Marker who recommended MRI brain [WF]  1708 Discussed with Dr. Avon Gully who will admit.  Appreciate his expert consultation and care of patient. [WF]    Clinical Course User Index [WF] Tedd Sias, [place]  Medical Decision Making Amount and/or Complexity of Data Reviewed Labs: ordered. Radiology: ordered.  Risk OTC drugs. Decision regarding  hospitalization.    This patient presents to the ED for concern of weakness, this involves a number of treatment options, and is a complaint that carries with it a moderate to high risk of complications and morbidity.  The differential diagnosis includes stroke, cauda equina, physical rehabilitation, electrolyte disturbance, metabolic or endocrine encephalopathy   Co morbidities: Discussed in HPI   Brief History:  Approximately 2 days of extremity weakness seems that he has had fluctuating symptoms between right upper and lower extremity versus just left lower extremity.  Seems that he has been having symptoms for greater than 24 hours however.  Zickel exam notable for left lower extremity weakness however on my frequent reassessments he seems to have resolution of his left lower extremity weakness.  He is generally weak however and will require MRI brain and MRI lumbar spine to rule out stroke or cauda equina.  I discussed with Dr. Cheral Marker of neurology  I also talked with my attending physician who assessed patient at bedside  And is in agreement with my plan  We will provide patient with Tylenol he has not had any falls and I suspect that his right upper extremity pain is not from a fracture    EMR reviewed including pt PMHx, past surgical history and past visits to ER.   See HPI for more details   Lab Tests:  I ordered and independently interpreted labs.  The pertinent results include:    Labs notable for CBC without notable abnormality mild leukocytosis and mild anemia present.  CMP with continued CKD stage IV not on dialysis.  COVID influenza negative.  Troponin x1 marginally elevated EKG nonischemic.  Will trend.  Urinalysis abnormal urine culture obtained we will hold off on empiric treatment.  Imaging Studies:  Abnormal findings. I personally reviewed all imaging studies. Imaging notable for CT without contrast of head that shows age-indeterminate stroke however  after discussing with neurology it is hard to know whether this is related to his symptoms currently.  Will obtain MRI.  X-rays without evidence of fracture.   Cardiac Monitoring:  NA EKG non-ischemic   Medicines ordered:  I ordered medication including Tylenol for right arm pain Reevaluation of the patient after these medicines showed that the patient stayed the same I have reviewed the patients home medicines and have made adjustments as needed   Critical Interventions:     Consults:  I requested consultation with Dr. Cheral Marker of neurology,  and discussed lab and imaging findings as well as pertinent plan - they recommend: MRI brain.    Reevaluation:  After the interventions noted above I re-evaluated patient and found that they have improved/resolved   Social Determinants of Health:  The patient's social determinants of health were a factor in the care of this patient    Problem List / ED Course:  Atypical extremity weakness Alzheimer's Low back pain   Dispostion:  After consideration of the diagnostic results and the patients response to treatment, I feel that the patent would benefit from MRI imaging to rule out acute stroke and to assess for spinal cord impingement.  Ultimately patient is very weak and will require physical therapy Dr. Avon Gully willing to see patient and place in observation pending work-up completion.   Final Clinical Impression(s) / ED Diagnoses Final diagnoses:  Left leg weakness    Rx / DC Orders ED Discharge Orders  None         Tedd Sias, [place] 11/10/21 1840    Teressa Lower, MD 11/11/21 [phone]

## 2021-11-10 NOTE — Progress Notes (Signed)
Attempted to scan MRI. Pt would not fit in scanner due to kyphosis.

## 2021-11-11 ENCOUNTER — Encounter (HOSPITAL_COMMUNITY): Payer: Self-pay | Admitting: Internal Medicine

## 2021-11-11 DIAGNOSIS — G459 Transient cerebral ischemic attack, unspecified: Secondary | ICD-10-CM | POA: Diagnosis not present

## 2021-11-11 LAB — BASIC METABOLIC PANEL
Anion gap: 15 (ref 5–15)
BUN: 82 mg/dL — ABNORMAL HIGH (ref 8–23)
CO2: 22 mmol/L (ref 22–32)
Calcium: 9.2 mg/dL (ref 8.9–10.3)
Chloride: 102 mmol/L (ref 98–111)
Creatinine, Ser: 3.81 mg/dL — ABNORMAL HIGH (ref 0.61–1.24)
GFR, Estimated: 15 mL/min — ABNORMAL LOW (ref >60)
Glucose, Bld: 237 mg/dL — ABNORMAL HIGH (ref 70–99)
Potassium: 4.6 mmol/L (ref 3.5–5.1)
Sodium: 139 mmol/L (ref 135–145)

## 2021-11-11 LAB — GLUCOSE, CAPILLARY
Glucose-Capillary: 227 mg/dL — ABNORMAL HIGH (ref 70–99)
Glucose-Capillary: 227 mg/dL — ABNORMAL HIGH (ref 70–99)
Glucose-Capillary: 248 mg/dL — ABNORMAL HIGH (ref 70–99)
Glucose-Capillary: 179 mg/dL — ABNORMAL HIGH (ref 70–99)
Glucose-Capillary: 249 mg/dL — ABNORMAL HIGH (ref 70–99)

## 2021-11-11 LAB — CBC
HCT: 36.9 % — ABNORMAL LOW (ref 39.0–52.0)
Hemoglobin: 12.1 g/dL — ABNORMAL LOW (ref 13.0–17.0)
MCH: 29.6 pg (ref 26.0–34.0)
MCHC: 32.8 g/dL (ref 30.0–36.0)
MCV: 90.2 fL (ref 80.0–100.0)
Platelets: 177 10*3/uL (ref 150–400)
RBC: 4.09 MIL/uL — ABNORMAL LOW (ref 4.22–5.81)
RDW: 13.5 % (ref 11.5–15.5)
WBC: 10.4 10*3/uL (ref 4.0–10.5)
nRBC: 0 % (ref 0.0–0.2)

## 2021-11-11 MED ORDER — LACTATED RINGERS IV SOLN
INTRAVENOUS | Status: DC
Start: 2021-11-11 — End: 2021-11-15

## 2021-11-11 MED ORDER — NYSTATIN 100000 UNIT/GM EX POWD
1.0000 "application " | Freq: Two times a day (BID) | CUTANEOUS | Status: DC
  Administered 2021-11-11 – 2021-11-14 (×6): 1 via TOPICAL
  Filled 2021-11-11: qty 15

## 2021-11-11 MED ORDER — SODIUM CHLORIDE 0.9 % IV SOLN
1.0000 g | INTRAVENOUS | Status: DC
  Administered 2021-11-11 – 2021-11-14 (×4): 1 g via INTRAVENOUS
  Filled 2021-11-11 (×4): qty 10

## 2021-11-11 NOTE — Evaluation (Signed)
Occupational Therapy Evaluation Patient Details Name: Daniel Ochoa MRN: [id number] DOB: [birth year] Today's Date: 11/11/2021   History of Present Illness Daniel Ochoa is a 81 y.o. male presents with transient weakness and ambulatory dysfunction. PCP sent to ED for workup for TIA. Recent history of UTI s/p antibiotics 2-3 weeks ago per wife. CT: Negative for acute findings.PHMx: IDDM2 on insulin pump, lumbago, Alzheimer's   Clinical Impression   This 81 yo male admitted with above presents to acute OT with PLOF of being ambulatory on his own and able to A with some basic ADLs up until about a week ago. He currently is total A +2 for bed mobility, Max A-Mod A +2 for sit<>stand, can drink from cup with presented to him and wash is face when given a washcloth. His RUE is quite painful to move/touch (I have made MD aware through chat text) which is also impeding his abilities since he is right hand dominant. He will continue to benefit from acute OT with follow up at SNF.      Recommendations for follow up therapy are one component of a multi-disciplinary discharge planning process, led by the attending physician.  Recommendations may be updated based on patient status, additional functional criteria and insurance authorization.   Follow Up Recommendations  Skilled nursing-short term rehab (<3 hours/day)    Assistance Recommended at Discharge Frequent or constant Supervision/Assistance  Patient can return home with the following Two people to help with bathing/dressing/bathroom;Two people to help with walking and/or transfers;Assistance with feeding;Help with stairs or ramp for entrance;Assist for transportation;Assistance with cooking/housework;Direct supervision/assist for financial management;Direct supervision/assist for medications management    Functional Status Assessment  Patient has had a recent decline in their functional status and demonstrates the ability to make  significant improvements in function in a reasonable and predictable amount of time.  Equipment Recommendations  Other (comment) (TBD next venue)       Precautions / Restrictions Precautions Precautions: Fall Precaution Comments: right arm really painful even to touch Restrictions Weight Bearing Restrictions: No      Mobility Bed Mobility Overal bed mobility: Needs Assistance Bed Mobility: Supine to Sit, Sit to Supine, Rolling Rolling: Max assist   Supine to sit: Total assist, +2 for physical assistance Sit to supine: Total assist, +2 for physical assistance   General bed mobility comments: step-by-step cues    Transfers Overall transfer level: Needs assistance Equipment used: 2 person hand held assist Transfers: Sit to/from Stand             General transfer comment: Max A +2 sit<>stand raised bed x1; Mod A +2 sit<>stand raised bed x2      Balance Overall balance assessment: Needs assistance Sitting-balance support: No upper extremity supported, Feet supported Sitting balance-Leahy Scale: Fair     Standing balance support: Bilateral upper extremity supported Standing balance-Leahy Scale: Poor                             ADL either performed or assessed with clinical judgement   ADL Overall ADL's : Needs assistance/impaired Eating/Feeding: Bed level;Minimal assistance Eating/Feeding Details (indicate cue type and reason): has to currenlty use non dominant arm (L) due to painful dominant arm (R) Grooming: Wash/dry face;Min guard;Sitting Grooming Details (indicate cue type and reason): did use RUE to help adjust washcloth in left hand Upper Body Bathing: Maximal assistance;Sitting   Lower Body Bathing: Total assistance Lower Body Bathing Details (indicate cue type  and reason): Max A +2 sit<>stand from raised be (gait belt and chuck pad, no AD) Upper Body Dressing : Total assistance;Sitting   Lower Body Dressing: Total assistance Lower Body  Dressing Details (indicate cue type and reason): Max A +2 sit<>stand from raised be (gait belt and chuck pad, no AD)     Toileting- Clothing Manipulation and Hygiene: Total assistance Toileting - Clothing Manipulation Details (indicate cue type and reason): Max A +2 sit<>stand from raised be (gait belt and chuck pad, no AD)             Vision Baseline Vision/History: 1 Wears glasses Ability to See in Adequate Light: 0 Adequate Patient Visual Report: No change from baseline              Pertinent Vitals/Pain Pain Assessment Pain Assessment: Faces Faces Pain Scale: Hurts worst Pain Location: RUE at times when moved or touch Pain Descriptors / Indicators: Grimacing, Guarding Pain Intervention(s): Limited activity within patient's tolerance, Monitored during session, Repositioned     Hand Dominance Right   Extremity/Trunk Assessment Upper Extremity Assessment Upper Extremity Assessment: RUE deficits/detail RUE Deficits / Details: only moves it on his own minimally when asked to do so (otherwise does not want to move it due to  pain) RUE Coordination: decreased fine motor;decreased gross motor              Cognition Arousal/Alertness: Awake/alert Behavior During Therapy: Flat affect Overall Cognitive Status: Impaired/Different from baseline Area of Impairment: Problem solving                             Problem Solving: Slow processing, Decreased initiation, Difficulty sequencing, Requires verbal cues, Requires tactile cues General Comments: Per wife he normally can get up and move around on his own, today he cannot with step by step nor general cues                Home Living Family/patient expects to be discharged to:: Skilled nursing facility Living Arrangements: Spouse/significant other Available Help at Discharge: Family;Available 24 hours/day                                    Prior Functioning/Environment Prior Level of  Function : Needs assist  Cognitive Assist : ADLs (cognitive)   ADLs (Cognitive): Step by step cues Physical Assist : ADLs (physical)   ADLs (physical): Grooming;Bathing;Dressing;Toileting;IADLs Mobility Comments: Per wife he ambulates sometimes with an AD, but in the home he usually does not use an AD ADLs Comments: Per wife he could do some of his basic ADLs        OT Problem List: Decreased strength;Decreased range of motion;Impaired balance (sitting and/or standing);Impaired UE functional use;Pain;Decreased coordination;Decreased cognition;Decreased safety awareness      OT Treatment/Interventions: Self-care/ADL training;DME and/or AE instruction;Patient/family education;Balance training;Therapeutic exercise;Therapeutic activities    OT Goals(Current goals can be found in the care plan section) Acute Rehab OT Goals Patient Stated Goal: wife is aware she cannot manage him at home by herself at his current level of A needed so she is open to SNF OT Goal Formulation: With family Time For Goal Achievement: 12-12-2021 Potential to Achieve Goals: Good  OT Frequency: Min 2X/week    Co-evaluation PT/OT/SLP Co-Evaluation/Treatment: Yes Reason for Co-Treatment: For patient/therapist safety;To address functional/ADL transfers PT goals addressed during session: Mobility/safety with mobility;Balance;Strengthening/ROM OT goals addressed during session: Strengthening/ROM;ADL's  and self-care      AM-PAC OT "6 Clicks" Daily Activity     Outcome Measure Help from another person eating meals?: A Little Help from another person taking care of personal grooming?: A Lot Help from another person toileting, which includes using toliet, bedpan, or urinal?: Total Help from another person bathing (including washing, rinsing, drying)?: A Lot Help from another person to put on and taking off regular upper body clothing?: Total Help from another person to put on and taking off regular lower body clothing?:  Total 6 Click Score: 10   End of Session Equipment Utilized During Treatment: Gait belt Nurse Communication: Mobility status  Activity Tolerance: Patient tolerated treatment well Patient left: in bed;with call bell/phone within reach;with bed alarm set;with family/visitor present  OT Visit Diagnosis: Unsteadiness on feet (R26.81);Other abnormalities of gait and mobility (R26.89);Muscle weakness (generalized) (M62.81);Pain;Other symptoms and signs involving cognitive function Pain - Right/Left: Right Pain - part of body: Arm                Time: 2336-1224 OT Time Calculation (min): 27 min Charges:  OT General Charges $OT Visit: 1 Visit OT Evaluation $OT Eval Moderate Complexity: 1 Mod  [place] [address], OTR/L Acute [employer] Pager [phone] Office [phone]    Almon Register 11/11/2021, 9:56 AM

## 2021-11-11 NOTE — Progress Notes (Addendum)
PROGRESS NOTE    Daniel Ochoa  JSE:[id number] DOB: 1941-01-12 DOA: 11/10/2021 PCP: Donnajean Lopes, MD   Brief Narrative:  Daniel Ochoa is a 81 y.o. male with medical history significant of IDDM2 on insulin pump, lumbago, Alzheimer's who presents with transient weakness and ambulatory dysfunction. PCP sent to ED for workup for TIA. Recent history of UTI s/p antibiotics 2-3 weeks ago per wife at bedside. Personal history and ROS limited due to patient's Alzheimer's. Neurology sidelined in the ED, given atypical symptoms will hold off on formal consult pending MRI, CT head unremarkable for acute findings.    2/25 - Overnight patient was unable to obtain MRI due to profound kyphosis, discussed with neurology given patient's atypical symptoms, diffuse fatigue and weakness with no focal deficits no indication for repeat imaging  Assessment & Plan:   Principal Problem:   TIA (transient ischemic attack) Active Problems:   Diabetes mellitus type 2 with complications ([place])   Chronic kidney disease, stage IV (severe) ([facility])   Alzheimer's disease ([place])   Hypothyroidism   Muscle weakness   Paroxysmal atrial fibrillation ([facility])   Diabetic retinopathy associated with type 2 diabetes mellitus ([facility])  E. coli UTI, recurrent, POA  - Follow cultures, start ceftriaxone x3 days  - Recent UTI (cultures unavailable on epic)  - Finished unspecified antiobiotics 3-4 weeks ago per wife, seen by urology at that time  - No recent instrumentation - follow culture and de-escalate as possible   Cannot rule out TIA vs CVA, POA  - Atypical weakness, RLE weakness at home, LLE weakness here  - CT head unremarkable for acute findings  - MRI pending  - Consult neuro vs surgery pending MRI findings   IDDM2 on insulin pump - Transition to sliding scale for now   CKD4, stable - Follow repeat labs, hold nephrotoxins   DVT prophylaxis: Heparin  Code Status: Full  Family Communication: Wife at  bedside   Status is: Inpatient  Dispo: The patient is from: Home              Anticipated d/c is to: To be determined              Anticipated d/c date is: 48 to 72 hours              Patient currently not medically stable for discharge  Consultants:  None  Procedures:  None  Antimicrobials:  Ceftriaxone x3 days, may prolong to 5-day course given failure of outpatient therapy and recent instrumentation with urology  Subjective: Unable to obtain MRI overnight -continues to be somnolent, generalized weakness, review of systems somewhat limited but patient following commands.  Objective: Vitals:   11/10/21 1913 11/10/21 2319 11/11/21 0356 11/11/21 0402  BP: (!) 121/53 (!) 149/57 (!) 162/66 (!) 149/73  Pulse: 64 72 77 64  Resp: 18 18 16 16   Temp: 97.9 F (36.6 C) 98.7 F (37.1 C) 98.3 F (36.8 C) 97.6 F (36.4 C)  TempSrc: Oral Oral Oral Oral  SpO2: 100% 96% 94% 100%  Weight:      Height:        Intake/Output Summary (Last 24 hours) at 11/11/2021 0729 Last data filed at 11/11/2021 0610 Gross per 24 hour  Intake 240 ml  Output 600 ml  Net -360 ml   Filed Weights   11/10/21 1227  Weight: 114.8 kg    Examination:  General:  Pleasantly resting in bed, No acute distress. HEENT:  Normocephalic atraumatic.  Sclerae nonicteric, noninjected.  Extraocular movements intact bilaterally. Neck:  Without mass or deformity.  Trachea is midline. Lungs:  Clear to auscultate bilaterally without rhonchi, wheeze, or rales. Heart:  Regular rate and rhythm.  Without murmurs, rubs, or gallops. Abdomen:  Soft, nontender, nondistended.  Without guarding or rebound. Extremities: Profoundly weak globally, 2 out of 5 strength bilateral lower extremities , 4 out of 5 strength bilateral upper extremities  Vascular:  Dorsalis pedis and posterior tibial pulses palpable bilaterally. Skin:  Warm and dry, no erythema, no ulcerations.  Data Reviewed: I have personally reviewed following labs and  imaging studies  CBC: Recent Labs  Lab 11/10/21 1406 11/10/21 1409 11/11/21 0101  WBC  --  10.8* 10.4  NEUTROABS  --  6.8  --   HGB 11.9* 11.8* 12.1*  HCT 35.0* 37.6* 36.9*  MCV  --  93.1 90.2  PLT  --  178 295   Basic Metabolic Panel: Recent Labs  Lab 11/10/21 1406 11/10/21 1409 11/11/21 0101  NA 140 140 139  K 4.4 4.4 4.6  CL 105 104 102  CO2  --  25 22  GLUCOSE 146* 153* 237*  BUN 88* 82* 82*  CREATININE 4.40* 4.06* 3.81*  CALCIUM  --  9.1 9.2   GFR: Estimated Creatinine Clearance: 19 mL/min (A) (by C-G formula based on SCr of 3.81 mg/dL (H)). Liver Function Tests: Recent Labs  Lab 11/10/21 1409  AST 11*  ALT 9  ALKPHOS 50  BILITOT 0.4  PROT 6.7  ALBUMIN 3.0*   No results for input(s): LIPASE, AMYLASE in the last 168 hours. No results for input(s): AMMONIA in the last 168 hours. Coagulation Profile: Recent Labs  Lab 11/10/21 1409  INR 1.0   Cardiac Enzymes: No results for input(s): CKTOTAL, CKMB, CKMBINDEX, TROPONINI in the last 168 hours. BNP (last 3 results) No results for input(s): PROBNP in the last 8760 hours. HbA1C: Recent Labs    11/10/21 1914  HGBA1C 6.0*   CBG: Recent Labs  Lab 11/10/21 2156 11/11/21 0605  GLUCAP 241* 227*   Lipid Profile: No results for input(s): CHOL, HDL, LDLCALC, TRIG, CHOLHDL, LDLDIRECT in the last 72 hours. Thyroid Function Tests: No results for input(s): TSH, T4TOTAL, FREET4, T3FREE, THYROIDAB in the last 72 hours. Anemia Panel: No results for input(s): VITAMINB12, FOLATE, FERRITIN, TIBC, IRON, RETICCTPCT in the last 72 hours. Sepsis Labs: No results for input(s): PROCALCITON, LATICACIDVEN in the last 168 hours.  Recent Results (from the past 240 hour(s))  Resp Panel by RT-PCR (Flu A&B, Covid) Nasopharyngeal Swab     Status: None   Collection Time: 11/10/21  2:52 PM   Specimen: Nasopharyngeal Swab; Nasopharyngeal(NP) swabs in vial transport medium  Result Value Ref Range Status   SARS Coronavirus 2  by RT PCR NEGATIVE NEGATIVE Final    Comment: (NOTE) SARS-CoV-2 target nucleic acids are NOT DETECTED.  The SARS-CoV-2 RNA is generally detectable in upper respiratory specimens during the acute phase of infection. The lowest concentration of SARS-CoV-2 viral copies this assay can detect is 138 copies/mL. A negative result does not preclude SARS-Cov-2 infection and should not be used as the sole basis for treatment or other patient management decisions. A negative result may occur with  improper specimen collection/handling, submission of specimen other than nasopharyngeal swab, presence of viral mutation(s) within the areas targeted by this assay, and inadequate number of viral copies(<138 copies/mL). A negative result must be combined with clinical observations, patient history, and epidemiological information. The expected result is Negative.  Fact Sheet  for Patients:  EntrepreneurPulse.com.au  Fact Sheet for Healthcare Providers:  IncredibleEmployment.be  This test is no t yet approved or cleared by the [place] FDA and  has been authorized for detection and/or diagnosis of SARS-CoV-2 by FDA under an Emergency Use Authorization (EUA). This EUA will remain  in effect (meaning this test can be used) for the duration of the COVID-19 declaration under Section 564(b)(1) of the Act, 21 U.S.C.section 360bbb-3(b)(1), unless the authorization is terminated  or revoked sooner.       Influenza A by PCR NEGATIVE NEGATIVE Final   Influenza B by PCR NEGATIVE NEGATIVE Final    Comment: (NOTE) The Xpert Xpress SARS-CoV-2/FLU/RSV plus assay is intended as an aid in the diagnosis of influenza from Nasopharyngeal swab specimens and should not be used as a sole basis for treatment. Nasal washings and aspirates are unacceptable for Xpert Xpress SARS-CoV-2/FLU/RSV testing.  Fact Sheet for Patients: EntrepreneurPulse.com.au  Fact  Sheet for Healthcare Providers: IncredibleEmployment.be  This test is not yet approved or cleared by the [place] FDA and has been authorized for detection and/or diagnosis of SARS-CoV-2 by FDA under an Emergency Use Authorization (EUA). This EUA will remain in effect (meaning this test can be used) for the duration of the COVID-19 declaration under Section 564(b)(1) of the Act, 21 U.S.C. section 360bbb-3(b)(1), unless the authorization is terminated or revoked.  Performed at [facility], [place] [address], [place], [place] [address]          Radiology Studies: DG Chest 2 View  Result Date: 11/10/2021 CLINICAL DATA:  Altered mental status EXAM: CHEST - 2 VIEW COMPARISON:  2015 FINDINGS: Shallow inspiration with low lung volumes. Patchy density at the left lung base. No significant pleural effusion. No pneumothorax. Cardiomediastinal contours are within normal limits. No acute osseous abnormality. Surgical clips overlie the right superior mediastinum or lower neck. IMPRESSION: Patchy atelectasis/consolidation at the left lung base. Electronically Signed   By: Macy Mis M.D.   On: 11/10/2021 13:50   DG Shoulder Right  Result Date: 11/10/2021 CLINICAL DATA:  Right elbow and shoulder pain. EXAM: RIGHT SHOULDER - 2+ VIEW COMPARISON:  No pertinent prior exams available for comparison. FINDINGS: There is normal bony alignment. No evidence of acute osseous or articular abnormality. Glenohumeral joint osteoarthrosis. Surgical clips within the right lower neck. IMPRESSION: No evidence of acute osseous or articular abnormality. Clinical humeral joint osteoarthrosis. Electronically Signed   By: Kellie Simmering D.O.   On: 11/10/2021 13:50   DG Elbow Complete Right  Result Date: 11/10/2021 CLINICAL DATA:  RIGHT elbow and shoulder pain, altered mental status in a 81 year old male. EXAM: RIGHT ELBOW - COMPLETE 3+ VIEW COMPARISON:  None FINDINGS: No visible fracture.  Degenerative changes about the elbow. Question mild elevation of the anterior fat pad but without distension/anterior bowing. No posterior fat pad. IMPRESSION: No visible fracture. Question mild elevation of the anterior fat pad but without anterior bowing of the fat pad. Favored to be related to degenerative changes, difficult to entirely exclude the possibility of occult fracture, most commonly of the radial head in a skeletally mature patient. No posterior fat pad elevation, correlate with any point tenderness over the radial head. Electronically Signed   By: Zetta Bills M.D.   On: 11/10/2021 13:52   CT HEAD WO CONTRAST (5MM)  Result Date: 11/10/2021 CLINICAL DATA:  Neuro deficit, acute, stroke suspected left leg weakness / R arm weakness / slurred speech for 2 days EXAM: CT HEAD WITHOUT CONTRAST  TECHNIQUE: Contiguous axial images were obtained from the base of the skull through the vertex without intravenous contrast. RADIATION DOSE REDUCTION: This exam was performed according to the departmental dose-optimization program which includes automated exposure control, adjustment of the mA and/or kV according to patient size and/or use of iterative reconstruction technique. COMPARISON:  None. FINDINGS: Suboptimal evaluation due to artifact. Brain: There is no acute intracranial hemorrhage, mass effect, or edema. Gray-white differentiation is preserved. There is no extra-axial fluid collection. Ventricles and sulci are within normal limits in size and configuration. Patchy low-density in the supratentorial white matter is nonspecific but may reflect mild chronic microvascular ischemic changes. Age-indeterminate small vessel infarct at the right caudothalamic groove. Vascular: There is atherosclerotic calcification at the skull base. Skull: Prior suboccipital craniectomy. Sinuses/Orbits: No acute finding. Other: None. IMPRESSION: Degraded by artifact. No acute intracranial hemorrhage or mass effect.  Age-indeterminate small vessel infarct at the right caudothalamic groove. Mild chronic microvascular ischemic changes. Electronically Signed   By: Macy Mis M.D.   On: 11/10/2021 13:21    Scheduled Meds:  heparin  5,000 Units Subcutaneous Q8H   insulin aspart  0-5 Units Subcutaneous QHS   insulin aspart  0-9 Units Subcutaneous TID WC   Continuous Infusions:  cefTRIAXone (ROCEPHIN)  IV       LOS: 1 day   Time spent: 31min  Allyanna Appleman C Shelma Eiben, DO [facility]  If 7PM-7AM, please contact night-coverage [email or web address]  11/11/2021, 7:29 AM

## 2021-11-11 NOTE — NC FL2 (Signed)
[place] LEVEL OF CARE SCREENING TOOL     IDENTIFICATION  Patient Name: Daniel Ochoa Birthdate: 1941/01/25 Sex: male Admission Date (Current Location): 11/10/2021  [facility] and [place] Number:  Herbalist and Address:  [facility], [place] [address] [place], [place] [address]      Provider Number: [id number]  Attending Physician Name and Address:  Little Ishikawa, MD  Relative Name and Phone Number:       Current Level of Care: Hospital Recommended Level of Care: [place] Prior Approval Number:    Date Approved/Denied:   PASRR Number: [id number]  Discharge Plan: SNF    Current Diagnoses: Patient Active Problem List   Diagnosis Date Noted   TIA (transient ischemic attack) 11/10/2021   Constipation 11/03/2021   Body mass index (BMI) 36.0-36.9, adult 11/03/2021   Age-related cataract 11/03/2021   Degeneration of lumbar intervertebral disc 11/03/2021   Diabetic retinopathy associated with type 2 diabetes mellitus ([place]) 39/76/7341   Diastolic dysfunction 93/79/0240   ESRD (end stage renal disease) ([place]) 08/07/2021   Low back pain 12/17/2020   Weakness 04/11/2020   Paroxysmal atrial fibrillation ([place]) 02/22/2020   Right foot drop 02/22/2020   Shoulder joint pain 02/22/2020   Sedative, hypnotic or anxiolytic dependence, uncomplicated ([place]) 97/35/3299   Lipoprotein deficiency disorder 02/17/2019   Muscle weakness 09/30/2018   Long term (current) use of insulin ([place]) 01/23/2018   Encounter for general adult medical examination without abnormal findings 01/07/2018   Alzheimer's disease ([place]) 04/23/2017   Diabetic renal disease ([place]) 04/23/2017   Hypothyroidism 04/23/2017   Morbid obesity ([place]) 04/23/2017   Neck pain 04/23/2017   Peripheral venous insufficiency 04/23/2017   Retinal disorder 04/23/2017   Diabetic peripheral neuropathy ([place]) 10/26/2015   Dyspnea 07/23/2014   Acute pulmonary  embolism ([place]) 04/15/2014   Hypoxia 04/13/2014   Acute deep vein thrombosis (DVT) of tibial vein of right lower extremity ([place]) 04/13/2014   Pulmonary embolism ([place]) 04/13/2014   Diabetes mellitus type 2 with complications (HCC)    Essential hypertension    OSA on CPAP    Chronic kidney disease, stage IV (severe) (HCC)     Orientation RESPIRATION BLADDER Height & Weight     Self  Normal External catheter, Incontinent Weight: 253 lb 1.4 oz (114.8 kg) Height:  5\' 7"  (170.2 cm)  BEHAVIORAL SYMPTOMS/MOOD NEUROLOGICAL BOWEL NUTRITION STATUS      Incontinent Diet  AMBULATORY STATUS COMMUNICATION OF NEEDS Skin   Extensive Assist   Normal                       Personal Care Assistance Level of Assistance  Bathing, Feeding, Dressing Bathing Assistance: Maximum assistance Feeding assistance: Maximum assistance Dressing Assistance: Maximum assistance     Functional Limitations Info             SPECIAL CARE FACTORS FREQUENCY  PT (By licensed PT), OT (By licensed OT)     PT Frequency: 5x a week OT Frequency: 5x a week            Contractures      Additional Factors Info  Code Status, Allergies Code Status Info: Full Allergies Info: Statins   Lisinopril   Latex           Current Medications (11/11/2021):  This is the current hospital active medication list Current Facility-Administered Medications  Medication Dose Route Frequency Provider Last Rate Last Admin   acetaminophen (  TYLENOL) tablet 650 mg  650 mg Oral Q6H PRN Tedd Sias, PA   650 mg at 11/10/21 1651   cefTRIAXone (ROCEPHIN) 1 g in sodium chloride 0.9 % 100 mL IVPB  1 g Intravenous Q24H Little Ishikawa, MD 200 mL/hr at 11/11/21 0814 1 g at 11/11/21 0814   heparin injection 5,000 Units  5,000 Units Subcutaneous Q8H Little Ishikawa, MD   5,000 Units at 11/11/21 2549   insulin aspart (novoLOG) injection 0-5 Units  0-5 Units Subcutaneous QHS Little Ishikawa, MD   2 Units at 11/10/21 2245    insulin aspart (novoLOG) injection 0-9 Units  0-9 Units Subcutaneous TID WC Little Ishikawa, MD   2 Units at 11/11/21 1208   lactated ringers infusion   Intravenous Continuous Little Ishikawa, MD 75 mL/hr at 11/11/21 1105 New Bag at 11/11/21 1105     Discharge Medications: Please see discharge summary for a list of discharge medications.  Relevant Imaging Results:  Relevant Lab Results:   Additional Information SSN [id number] covid vax x3  Emeterio Reeve, LCSW

## 2021-11-11 NOTE — Progress Notes (Signed)
HOSPITAL MEDICINE OVERNIGHT EVENT NOTE    Notified by nursing that patient experienced a axillary temperature of 102 F at 7:37 PM this evening.  At that time, patient had received a dose of as needed Tylenol resulting in prompt resolution of the fever by 9:30 PM.  It is worth noting that patient is currently exhibiting a yellow MEWS score.   Chart reviewed, patient was originally hospitalized for TIA/CVA work-up but also is being treated for suspected urinary tract infection with intravenous ceftriaxone.  Patient is currently hemodynamically stable and according to nursing has had no clinical change or complaints.  We will continue current plan of care with intravenous ceftriaxone.  We will continue to closely monitor.  Vernelle Emerald  MD [facility]

## 2021-11-11 NOTE — TOC Initial Note (Signed)
Transition of Care [facility]) - Initial/Assessment Note    Patient Details  Name: Daniel Ochoa MRN: [id number] Date of Birth: 24-May-1941  Transition of Care [facility]) CM/SW Contact:    Emeterio Reeve, LCSW Phone Number: 11/11/2021, 1:57 PM  Clinical Narrative:                  CSW received SNF consult. CSW met with pt and wife at bedside. CSW introduced self and explained role at the hospital. Wife reports that PTA pt was at home with wife Pt was independent with mobility and ADL's.   CSW reviewed PT/OT recommendations for SNF. Pts wife states rehab will be good for the patient. Wife gave CSW permission to fax out to facilities in the area. Wife as no preference of facility at this time. But requested facilities in [place] be explored as well. CSW gave pt medicare.gov rating list to review. CSW explained insurance auth process. Pt reports they are covid vaccinated.  CSW will continue to follow.   Expected Discharge Plan: Skilled Nursing Facility Barriers to Discharge: Continued Medical Work up   Patient Goals and CMS Choice Patient states their goals for this hospitalization and ongoing recovery are:: to improve with therapy CMS Medicare.gov Compare Post Acute Care list provided to:: Patient Choice offered to / list presented to : Patient, Spouse  Expected Discharge Plan and Services Expected Discharge Plan: [place]       Living arrangements for the past 2 months: Single Family Home                                      Prior Living Arrangements/Services Living arrangements for the past 2 months: Single Family Home Lives with:: Spouse Patient language and need for interpreter reviewed:: Yes Do you feel safe going back to the place where you live?: Yes      Need for Family Participation in Patient Care: Yes (Comment) Care giver support system in place?: Yes (comment)   Criminal Activity/Legal Involvement Pertinent to Current  Situation/Hospitalization: No - Comment as needed  Activities of Daily Living Home Assistive Devices/Equipment: Other (Comment) ADL Screening (condition at time of admission) Patient's cognitive ability adequate to safely complete daily activities?: No Is the patient deaf or have difficulty hearing?: No Does the patient have difficulty seeing, even when wearing glasses/contacts?: No Does the patient have difficulty concentrating, remembering, or making decisions?: Yes Patient able to express need for assistance with ADLs?: Yes Does the patient have difficulty dressing or bathing?: Yes Independently performs ADLs?: No Does the patient have difficulty walking or climbing stairs?: Yes Weakness of Legs: Both Weakness of Arms/Hands: Right  Permission Sought/Granted Permission sought to share information with : Family Supports, Chartered certified accountant granted to share information with : Yes, Verbal Permission Granted     Permission granted to share info w AGENCY: SNF        Emotional Assessment Appearance:: Appears stated age Attitude/Demeanor/Rapport: Engaged Affect (typically observed): Appropriate Orientation: : Oriented to Self Alcohol / Substance Use: Not Applicable Psych Involvement: No (comment)  Admission diagnosis:  TIA (transient ischemic attack) [G45.9] Left leg weakness [R29.898] Patient Active Problem List   Diagnosis Date Noted   TIA (transient ischemic attack) 11/10/2021   Constipation 11/03/2021   Body mass index (BMI) 36.0-36.9, adult 11/03/2021   Age-related cataract 11/03/2021   Degeneration of lumbar intervertebral disc 11/03/2021   Diabetic retinopathy associated  with type 2 diabetes mellitus ([place]) 23/93/5940   Diastolic dysfunction 90/50/2561   ESRD (end stage renal disease) ([place]) 08/07/2021   Low back pain 12/17/2020   Weakness 04/11/2020   Paroxysmal atrial fibrillation (HCC) 02/22/2020   Right foot drop 02/22/2020   Shoulder joint  pain 02/22/2020   Sedative, hypnotic or anxiolytic dependence, uncomplicated ([place]) 54/88/4573   Lipoprotein deficiency disorder 02/17/2019   Muscle weakness 09/30/2018   Long term (current) use of insulin ([place]) 01/23/2018   Encounter for general adult medical examination without abnormal findings 01/07/2018   Alzheimer's disease ([place]) 04/23/2017   Diabetic renal disease ([place]) 04/23/2017   Hypothyroidism 04/23/2017   Morbid obesity ([place]) 04/23/2017   Neck pain 04/23/2017   Peripheral venous insufficiency 04/23/2017   Retinal disorder 04/23/2017   Diabetic peripheral neuropathy ([place]) 10/26/2015   Dyspnea 07/23/2014   Acute pulmonary embolism ([place]) 04/15/2014   Hypoxia 04/13/2014   Acute deep vein thrombosis (DVT) of tibial vein of right lower extremity ([place]) 04/13/2014   Pulmonary embolism ([place]) 04/13/2014   Diabetes mellitus type 2 with complications ([place])    Essential hypertension    OSA on CPAP    Chronic kidney disease, stage IV (severe) ([place])    PCP:  Donnajean Lopes, MD Pharmacy:   [facility] ([place]), [place] - [address] [place] ([place] [address] Phone: [phone] Fax: [phone]     Social Determinants of Health ([place]) Interventions    Readmission Risk Interventions No flowsheet data found.  Emeterio Reeve, LCSW Clinical Social Worker

## 2021-11-11 NOTE — Evaluation (Signed)
Physical Therapy Evaluation Patient Details Name: Daniel Ochoa MRN: [id number] DOB: [birth year] Today's Date: 11/11/2021  History of Present Illness  81 y/o male presented to ED on 11/10/21 for workup for TIA due to 2 days of slurred speech and weakness. Unable to obtain MRI due to kyphosis. CT head unremarkable. Patient with recent UTI x 2-3 weeks ago. PMH: DM type 2, Alzheimer's, Afib, CKD stage IV  Clinical Impression  Patient admitted with the above. Patient presents with generalized weakness, impaired balance, decreased activity tolerance, and impaired functional mobility. Patient with cognitive deficits at baseline. Patient requires totalA+2 for bed mobility and mod-maxA+2 for sit to stand transfer. Patient with complaints of R UE pain with movement and touch. Patient will benefit from skilled PT services during acute stay to address listed deficits. Recommend SNF at discharge to maximize functional mobility and decrease burden of care.        Recommendations for follow up therapy are one component of a multi-disciplinary discharge planning process, led by the attending physician.  Recommendations may be updated based on patient status, additional functional criteria and insurance authorization.  Follow Up Recommendations Skilled nursing-short term rehab (<3 hours/day)    Assistance Recommended at Discharge Frequent or constant Supervision/Assistance  Patient can return home with the following  Two people to help with walking and/or transfers;Two people to help with bathing/dressing/bathroom;Assistance with cooking/housework;Assistance with feeding;Direct supervision/assist for medications management;Direct supervision/assist for financial management;Assist for transportation;Help with stairs or ramp for entrance    Equipment Recommendations Other (comment) (TBD)  Recommendations for Other Services       Functional Status Assessment Patient has had a recent decline in their  functional status and demonstrates the ability to make significant improvements in function in a reasonable and predictable amount of time.     Precautions / Restrictions Precautions Precautions: Fall Precaution Comments: right arm really painful even to touch Restrictions Weight Bearing Restrictions: No      Mobility  Bed Mobility Overal bed mobility: Needs Assistance Bed Mobility: Supine to Sit, Sit to Supine, Rolling Rolling: Max assist   Supine to sit: Total assist, +2 for physical assistance Sit to supine: Total assist, +2 for physical assistance   General bed mobility comments: step-by-step cues    Transfers Overall transfer level: Needs assistance Equipment used: 2 person hand held assist Transfers: Sit to/from Stand Sit to Stand: Mod assist, +2 physical assistance, +2 safety/equipment, From elevated surface, Max assist           General transfer comment: Max A +2 sit<>stand raised bed x1; Mod A +2 sit<>stand raised bed x2    Ambulation/Gait               General Gait Details: unable at this time  Stairs            Wheelchair Mobility    Modified Rankin (Stroke Patients Only) Modified Rankin (Stroke Patients Only) Pre-Morbid Rankin Score: Moderately severe disability Modified Rankin: Severe disability     Balance Overall balance assessment: Needs assistance Sitting-balance support: No upper extremity supported, Feet supported Sitting balance-Leahy Scale: Fair     Standing balance support: Bilateral upper extremity supported Standing balance-Leahy Scale: Poor                               Pertinent Vitals/Pain Pain Assessment Pain Assessment: Faces Faces Pain Scale: Hurts worst Pain Location: RUE at times when moved or touch Pain Descriptors / Indicators:  Grimacing, Guarding Pain Intervention(s): Monitored during session, Repositioned, Limited activity within patient's tolerance    Home Living Family/patient expects  to be discharged to:: Skilled nursing facility Living Arrangements: Spouse/significant other Available Help at Discharge: Family;Available 24 hours/day                    Prior Function Prior Level of Function : Needs assist  Cognitive Assist : ADLs (cognitive)   ADLs (Cognitive): Step by step cues Physical Assist : ADLs (physical)   ADLs (physical): Grooming;Bathing;Dressing;Toileting;IADLs Mobility Comments: Per wife he ambulates sometimes with an AD, but in the home he usually does not use an AD. In past week, patient has not been mobilizing ADLs Comments: Per wife he could do some of his basic ADLs     Hand Dominance   Dominant Hand: Right    Extremity/Trunk Assessment   Upper Extremity Assessment Upper Extremity Assessment: Defer to OT evaluation RUE Deficits / Details: only moves it on his own minimally when asked to do so (otherwise does not want to move it due to  pain) RUE Coordination: decreased fine motor;decreased gross motor    Lower Extremity Assessment Lower Extremity Assessment: Generalized weakness    Cervical / Trunk Assessment Cervical / Trunk Assessment: Kyphotic  Communication   Communication: No difficulties  Cognition Arousal/Alertness: Awake/alert Behavior During Therapy: Flat affect Overall Cognitive Status: History of cognitive impairments - at baseline Area of Impairment: Problem solving                             Problem Solving: Slow processing, Decreased initiation, Difficulty sequencing, Requires verbal cues, Requires tactile cues General Comments: Per wife he normally can get up and move around on his own, today he cannot with step by step nor general cues        General Comments      Exercises     Assessment/Plan    PT Assessment Patient needs continued PT services  PT Problem List Decreased strength;Decreased activity tolerance;Decreased balance;Decreased mobility;Decreased coordination;Decreased  cognition;Decreased knowledge of use of DME;Decreased knowledge of precautions;Decreased safety awareness       PT Treatment Interventions DME instruction;Gait training;Functional mobility training;Therapeutic activities;Therapeutic exercise;Balance training;Neuromuscular re-education;Patient/family education    PT Goals (Current goals can be found in the Care Plan section)  Acute Rehab PT Goals Patient Stated Goal: did not state PT Goal Formulation: With family Time For Goal Achievement: 2021-12-22 Potential to Achieve Goals: Fair    Frequency Min 3X/week     Co-evaluation PT/OT/SLP Co-Evaluation/Treatment: Yes Reason for Co-Treatment: For patient/therapist safety;To address functional/ADL transfers PT goals addressed during session: Mobility/safety with mobility;Balance;Strengthening/ROM OT goals addressed during session: Strengthening/ROM;ADL's and self-care       AM-PAC PT "6 Clicks" Mobility  Outcome Measure Help needed turning from your back to your side while in a flat bed without using bedrails?: Total Help needed moving from lying on your back to sitting on the side of a flat bed without using bedrails?: Total Help needed moving to and from a bed to a chair (including a wheelchair)?: Total Help needed standing up from a chair using your arms (e.g., wheelchair or bedside chair)?: Total Help needed to walk in hospital room?: Total Help needed climbing 3-5 steps with a railing? : Total 6 Click Score: 6    End of Session Equipment Utilized During Treatment: Gait belt Activity Tolerance: Patient tolerated treatment well Patient left: in bed;with call bell/phone within reach;with bed  alarm set;with family/visitor present Nurse Communication: Mobility status PT Visit Diagnosis: Unsteadiness on feet (R26.81);Muscle weakness (generalized) (M62.81);Difficulty in walking, not elsewhere classified (R26.2)    Time: 5726-2035 PT Time Calculation (min) (ACUTE ONLY): 28  min   Charges:   PT Evaluation $PT Eval Moderate Complexity: 1 Mod          Padraic Marinos A. Gilford Rile PT, DPT Acute Rehabilitation Services Pager [phone] Office [phone]   Linna Hoff 11/11/2021, 10:40 AM

## 2021-11-11 NOTE — Progress Notes (Signed)
°  Subjective:  Patient ID: Daniel Ochoa, male    DOB: 1940-11-21,  MRN: [id number]  Daniel Ochoa presents to clinic today for at risk foot care with history of diabetic neuropathy and thick, elongated toenails b/l feet which are tender when wearing enclosed shoe gear.  Patient states blood glucose was 135 mg/dl today.    New problem(s): with chief concern of episode of toe discoloration of left 3rd toe. Wife relates toe became red and blistered. She applied Mupirocin Ointment on digit and it eventually resolved.  PCP is Donnajean Lopes, MD , and last visit was October 06, 2021.  Allergies  Allergen Reactions   Statins Other (See Comments)    Elevated CK level   Lisinopril     Other reaction(s): stopped by pulmonology  to help with breathing   Latex Rash    Severe blistery rash    Review of Systems: Negative except as noted in the HPI. Objective:   Constitutional Daniel Ochoa is a pleasant 81 y.o. Caucasian male, obese in NAD. AAO x 3.   Vascular CFT <3 seconds b/l LE. Diminished pedal pulses b/l LE. Pedal hair absent. No pain with calf compression b/l. Lower extremity skin temperature gradient within normal limits. Nonpitting edema noted BLE. Evidence of chronic venous insufficiency b/l LE.  Neurologic Normal speech. Oriented to person, place, and time. Protective sensation intact 5/5 intact bilaterally with 10g monofilament b/l. Vibratory sensation intact b/l.  Dermatologic Pedal skin is warm and supple b/l LE. No open wounds b/l LE. No interdigital macerations noted b/l LE. Toenails 1-5 b/l elongated, discolored, dystrophic, thickened, crumbly with subungual debris and tenderness to dorsal palpation.  Orthopedic: Muscle strength 5/5 to all lower extremity muscle groups bilaterally. No pain, crepitus or joint limitation noted with ROM bilateral LE. HAV with bunion deformity noted b/l LE. Hammertoe deformity noted 2-5 b/l.   Radiographs: None  Last A1c:   Hemoglobin A1C Latest Ref Rng & Units 11/10/2021  HGBA1C 4.8 - 5.6 % 6.0(H)  Some recent data might be hidden   Assessment:   1. Onychomycosis   2. Former smoker   3. Diabetic peripheral neuropathy associated with type 2 diabetes mellitus ([place])   4. Type 2 diabetes mellitus with diabetic peripheral angiopathy without gangrene, with long-term current use of insulin ([place])    Plan:  Patient was evaluated and treated and all questions answered. Consent given for treatment as described below: -Examined patient. -Discussed need for baseline ABI's due to multiple risk factors.  -Ordered noninvasive arterial studies ABIs with and without TBIs for b/l lower extremities.They are in agreement. Information dispensed on PAD. -Mycotic toenails 1-5 bilaterally were debrided in length and girth with sterile nail nippers and dremel without incident. -Patient/POA to call should there be question/concern in the interim.  Return in about 3 months (around 01/31/2022).  Marzetta Board, DPM

## 2021-11-11 NOTE — Progress Notes (Signed)
°   11/11/21 2000  Notify: Provider  Provider Name/Title Dr Cyd Silence  Date Provider Notified 11/11/21  Time Provider Notified 2001  Notification Type Page  Notification Reason Other (Comment) (Pt had a temp of 102)  Provider response No new orders  Date of Provider Response 11/11/21  Time of Provider Response 2100

## 2021-11-12 DIAGNOSIS — G459 Transient cerebral ischemic attack, unspecified: Secondary | ICD-10-CM | POA: Diagnosis not present

## 2021-11-12 LAB — CBC
HCT: 37.1 % — ABNORMAL LOW (ref 39.0–52.0)
Hemoglobin: 11.9 g/dL — ABNORMAL LOW (ref 13.0–17.0)
MCH: 29 pg (ref 26.0–34.0)
MCHC: 32.1 g/dL (ref 30.0–36.0)
MCV: 90.5 fL (ref 80.0–100.0)
Platelets: 169 10*3/uL (ref 150–400)
RBC: 4.1 MIL/uL — ABNORMAL LOW (ref 4.22–5.81)
RDW: 13.7 % (ref 11.5–15.5)
WBC: 14.9 10*3/uL — ABNORMAL HIGH (ref 4.0–10.5)
nRBC: 0 % (ref 0.0–0.2)

## 2021-11-12 LAB — URINE CULTURE: Culture: >=100000 — AB

## 2021-11-12 LAB — GLUCOSE, CAPILLARY
Glucose-Capillary: 227 mg/dL — ABNORMAL HIGH (ref 70–99)
Glucose-Capillary: 216 mg/dL — ABNORMAL HIGH (ref 70–99)
Glucose-Capillary: 213 mg/dL — ABNORMAL HIGH (ref 70–99)
Glucose-Capillary: 222 mg/dL — ABNORMAL HIGH (ref 70–99)
Glucose-Capillary: 233 mg/dL — ABNORMAL HIGH (ref 70–99)

## 2021-11-12 LAB — BASIC METABOLIC PANEL
Anion gap: 13 (ref 5–15)
BUN: 84 mg/dL — ABNORMAL HIGH (ref 8–23)
CO2: 20 mmol/L — ABNORMAL LOW (ref 22–32)
Calcium: 8.8 mg/dL — ABNORMAL LOW (ref 8.9–10.3)
Chloride: 106 mmol/L (ref 98–111)
Creatinine, Ser: 3.48 mg/dL — ABNORMAL HIGH (ref 0.61–1.24)
GFR, Estimated: 17 mL/min — ABNORMAL LOW (ref >60)
Glucose, Bld: 236 mg/dL — ABNORMAL HIGH (ref 70–99)
Potassium: 4.8 mmol/L (ref 3.5–5.1)
Sodium: 139 mmol/L (ref 135–145)

## 2021-11-12 MED ORDER — LEVOTHYROXINE SODIUM 100 MCG PO TABS
200.0000 ug | ORAL_TABLET | ORAL | Status: DC
  Administered 2021-11-13 – 2021-11-17 (×5): 200 ug via ORAL
  Filled 2021-11-12 (×5): qty 2

## 2021-11-12 MED ORDER — LACTATED RINGERS IV BOLUS
500.0000 mL | Freq: Once | INTRAVENOUS | Status: AC
  Administered 2021-11-12: 500 mL via INTRAVENOUS

## 2021-11-12 MED ORDER — LEVOTHYROXINE SODIUM 100 MCG PO TABS
300.0000 ug | ORAL_TABLET | ORAL | Status: DC
Start: 2021-11-19 — End: 2021-11-18

## 2021-11-12 NOTE — TOC Progression Note (Signed)
Transition of Care [facility]) - Progression Note    Patient Details  Name: Daniel Ochoa MRN: [id number] Date of Birth: Apr 29, 1941  [facility]) CM/SW [place], [place] Phone Number: 11/12/2021, 1:20 PM  Clinical Narrative:   CSW met with patient and spouse at bedside to discuss SNF offers. Spouse interested in [facility], [facility], [place], or [facility] [facility]. All referrals are still pending. CSW to follow up with SNF preferences to see if they can offer, and update patient and spouse when SNFs respond. CSW to follow.    Expected Discharge Plan: [place] Barriers to Discharge: Continued Medical Work up  Expected Discharge Plan and Services Expected Discharge Plan: [place] arrangements for the past 2 months: Single Family Home                                       Social Determinants of Health (SDOH) Interventions    Readmission Risk Interventions No flowsheet data found.

## 2021-11-12 NOTE — Progress Notes (Signed)
PROGRESS NOTE    Daniel Ochoa  ELF:[id number] DOB: [birth year] DOA: 11/10/2021 PCP: Donnajean Lopes, MD   Brief Narrative:  Daniel Ochoa is a 81 y.o. male with medical history significant of IDDM2 on insulin pump, lumbago, Alzheimer's who presents with transient weakness and ambulatory dysfunction. PCP sent to ED for workup for TIA. Recent history of UTI s/p antibiotics 2-3 weeks ago per wife at bedside. Personal history and ROS limited due to patient's Alzheimer's. Neurology sidelined in the ED, given atypical symptoms will hold off on formal consult pending MRI, CT head unremarkable for acute findings.    2/25 - unable to sit for MRI due to profound kyphosis 2/26-single febrile event overnight  Assessment & Plan:   Principal Problem:   TIA (transient ischemic attack) Active Problems:   Diabetes mellitus type 2 with complications (HCC)   Chronic kidney disease, stage IV (severe) (HCC)   Alzheimer's disease (HCC)   Hypothyroidism   Muscle weakness   Paroxysmal atrial fibrillation (HCC)   Diabetic retinopathy associated with type 2 diabetes mellitus (HCC)  E. coli UTI, recurrent, POA  - Follow cultures, start ceftriaxone x3 days - expand to 5 days given ongoing symptoms/fever - ? recent instrumentation with urology outpatient per wife  - Recent UTI (cultures unavailable on epic); repeat culture here E Coli - sensitivities pending  - Finished unspecified antiobiotics 3-4 weeks ago per wife  - If recurrent fevers consider expansion of antibiotics if cultures remain pending; otherwise de-escalate antibiotics appropriately   Unlikely TIA vs CVA, POA  -Unfortunately cannot rule out due to inability to obtain MRI   - Atypical weakness, RLE weakness at home, LLE weakness here  - CT head unremarkable for acute findings  - MRI unable to be obtained - sideline Neuro - no repeat imaging given non-acute, bilateral symptoms   IDDM2 on insulin pump, well controlled- Transition  to sliding scale for now  CKD4, stable - Follow repeat labs, hold nephrotoxins   DVT prophylaxis: Heparin  Code Status: Full  Family Communication: Wife at bedside   Status is: Inpatient  Dispo: The patient is from: Home              Anticipated d/c is to: To be determined, tentatively SNF given PT OT recommendations              Anticipated d/c date is: 48 to 72 hours              Patient currently not medically stable for discharge  Consultants:  None  Procedures:  None  Antimicrobials:  Ceftriaxone x5 days, de-escalate pending cultures as above  Subjective: Single febrile event overnight responded to Tylenol, otherwise no acute issues or events, patient much more awake alert closer to baseline per wife today than previous.  Denies nausea vomiting diarrhea constipation headache fevers chills or chest pain  Objective: Vitals:   11/11/21 1937 11/11/21 2130 11/11/21 2325 11/12/21 0322  BP: 138/67 (!) 147/61 (!) 145/60 (!) 148/51  Pulse: 82 84 82 74  Resp: 16 18 20 18   Temp: (!) 102 F (38.9 C) 98.9 F (37.2 C) 98.2 F (36.8 C) 97.9 F (36.6 C)  TempSrc: Oral Oral Oral Axillary  SpO2: 94% 98% 92% 95%  Weight:      Height:        Intake/Output Summary (Last 24 hours) at 11/12/2021 0711 Last data filed at 11/12/2021 0322 Gross per 24 hour  Intake 1411.15 ml  Output 800 ml  Net 611.15  ml    Filed Weights   11/10/21 1227  Weight: 114.8 kg    Examination:  General:  Pleasantly resting in bed, No acute distress. HEENT:  Normocephalic atraumatic.  Sclerae nonicteric, noninjected.  Extraocular movements intact bilaterally. Neck:  Without mass or deformity.  Trachea is midline. Lungs:  Clear to auscultate bilaterally without rhonchi, wheeze, or rales. Heart:  Regular rate and rhythm.  Without murmurs, rubs, or gallops. Abdomen:  Soft, nontender, nondistended.  Without guarding or rebound. Extremities: Profoundly weak globally, 3 out of 5 strength bilateral lower  extremities , 3 out of 5 strength bilateral upper extremities  Vascular:  Dorsalis pedis and posterior tibial pulses palpable bilaterally. Skin:  Warm and dry, no erythema, no ulcerations.  Data Reviewed: I have personally reviewed following labs and imaging studies  CBC: Recent Labs  Lab 11/10/21 1406 11/10/21 1409 11/11/21 0101 11/12/21 0144  WBC  --  10.8* 10.4 14.9*  NEUTROABS  --  6.8  --   --   HGB 11.9* 11.8* 12.1* 11.9*  HCT 35.0* 37.6* 36.9* 37.1*  MCV  --  93.1 90.2 90.5  PLT  --  178 177 469    Basic Metabolic Panel: Recent Labs  Lab 11/10/21 1406 11/10/21 1409 11/11/21 0101 11/12/21 0144  NA 140 140 139 139  K 4.4 4.4 4.6 4.8  CL 105 104 102 106  CO2  --  25 22 20*  GLUCOSE 146* 153* 237* 236*  BUN 88* 82* 82* 84*  CREATININE 4.40* 4.06* 3.81* 3.48*  CALCIUM  --  9.1 9.2 8.8*    GFR: Estimated Creatinine Clearance: 20.8 mL/min (A) (by C-G formula based on SCr of 3.48 mg/dL (H)). Liver Function Tests: Recent Labs  Lab 11/10/21 1409  AST 11*  ALT 9  ALKPHOS 50  BILITOT 0.4  PROT 6.7  ALBUMIN 3.0*    No results for input(s): LIPASE, AMYLASE in the last 168 hours. No results for input(s): AMMONIA in the last 168 hours. Coagulation Profile: Recent Labs  Lab 11/10/21 1409  INR 1.0    Cardiac Enzymes: No results for input(s): CKTOTAL, CKMB, CKMBINDEX, TROPONINI in the last 168 hours. BNP (last 3 results) No results for input(s): PROBNP in the last 8760 hours. HbA1C: Recent Labs    11/10/21 1914  HGBA1C 6.0*    CBG: Recent Labs  Lab 11/11/21 0738 11/11/21 1159 11/11/21 1622 11/11/21 2204 11/12/21 0633  GLUCAP 227* 248* 179* 249* 227*    Lipid Profile: No results for input(s): CHOL, HDL, LDLCALC, TRIG, CHOLHDL, LDLDIRECT in the last 72 hours. Thyroid Function Tests: No results for input(s): TSH, T4TOTAL, FREET4, T3FREE, THYROIDAB in the last 72 hours. Anemia Panel: No results for input(s): VITAMINB12, FOLATE, FERRITIN, TIBC,  IRON, RETICCTPCT in the last 72 hours. Sepsis Labs: No results for input(s): PROCALCITON, LATICACIDVEN in the last 168 hours.  Recent Results (from the past 240 hour(s))  Resp Panel by RT-PCR (Flu A&B, Covid) Nasopharyngeal Swab     Status: None   Collection Time: 11/10/21  2:52 PM   Specimen: Nasopharyngeal Swab; Nasopharyngeal(NP) swabs in vial transport medium  Result Value Ref Range Status   SARS Coronavirus 2 by RT PCR NEGATIVE NEGATIVE Final    Comment: (NOTE) SARS-CoV-2 target nucleic acids are NOT DETECTED.  The SARS-CoV-2 RNA is generally detectable in upper respiratory specimens during the acute phase of infection. The lowest concentration of SARS-CoV-2 viral copies this assay can detect is 138 copies/mL. A negative result does not preclude SARS-Cov-2 infection and  should not be used as the sole basis for treatment or other patient management decisions. A negative result may occur with  improper specimen collection/handling, submission of specimen other than nasopharyngeal swab, presence of viral mutation(s) within the areas targeted by this assay, and inadequate number of viral copies(<138 copies/mL). A negative result must be combined with clinical observations, patient history, and epidemiological information. The expected result is Negative.  Fact Sheet for Patients:  EntrepreneurPulse.com.au  Fact Sheet for Healthcare Providers:  IncredibleEmployment.be  This test is no t yet approved or cleared by the [place] FDA and  has been authorized for detection and/or diagnosis of SARS-CoV-2 by FDA under an Emergency Use Authorization (EUA). This EUA will remain  in effect (meaning this test can be used) for the duration of the COVID-19 declaration under Section 564(b)(1) of the Act, 21 U.S.C.section 360bbb-3(b)(1), unless the authorization is terminated  or revoked sooner.       Influenza A by PCR NEGATIVE NEGATIVE Final    Influenza B by PCR NEGATIVE NEGATIVE Final    Comment: (NOTE) The Xpert Xpress SARS-CoV-2/FLU/RSV plus assay is intended as an aid in the diagnosis of influenza from Nasopharyngeal swab specimens and should not be used as a sole basis for treatment. Nasal washings and aspirates are unacceptable for Xpert Xpress SARS-CoV-2/FLU/RSV testing.  Fact Sheet for Patients: EntrepreneurPulse.com.au  Fact Sheet for Healthcare Providers: IncredibleEmployment.be  This test is not yet approved or cleared by the [place] FDA and has been authorized for detection and/or diagnosis of SARS-CoV-2 by FDA under an Emergency Use Authorization (EUA). This EUA will remain in effect (meaning this test can be used) for the duration of the COVID-19 declaration under Section 564(b)(1) of the Act, 21 U.S.C. section 360bbb-3(b)(1), unless the authorization is terminated or revoked.  Performed at [facility], [place] [address], [place], [place] [address]   Urine Culture     Status: Abnormal (Preliminary result)   Collection Time: 11/10/21  4:58 PM   Specimen: Urine, Clean Catch  Result Value Ref Range Status   Specimen Description URINE, CLEAN CATCH  Final   Special Requests NONE  Final   Culture (A)  Final    >=100,000 COLONIES/mL ESCHERICHIA COLI SUSCEPTIBILITIES TO FOLLOW Performed at [facility], [place] [address] [place], [place] [address]    Report Status PENDING  Incomplete          Radiology Studies: DG Chest 2 View  Result Date: 11/10/2021 CLINICAL DATA:  Altered mental status EXAM: CHEST - 2 VIEW COMPARISON:  2015 FINDINGS: Shallow inspiration with low lung volumes. Patchy density at the left lung base. No significant pleural effusion. No pneumothorax. Cardiomediastinal contours are within normal limits. No acute osseous abnormality. Surgical clips overlie the right superior mediastinum or lower neck. IMPRESSION: Patchy  atelectasis/consolidation at the left lung base. Electronically Signed   By: Macy Mis M.D.   On: 11/10/2021 13:50   DG Shoulder Right  Result Date: 11/10/2021 CLINICAL DATA:  Right elbow and shoulder pain. EXAM: RIGHT SHOULDER - 2+ VIEW COMPARISON:  No pertinent prior exams available for comparison. FINDINGS: There is normal bony alignment. No evidence of acute osseous or articular abnormality. Glenohumeral joint osteoarthrosis. Surgical clips within the right lower neck. IMPRESSION: No evidence of acute osseous or articular abnormality. Clinical humeral joint osteoarthrosis. Electronically Signed   By: Kellie Simmering D.O.   On: 11/10/2021 13:50   DG Elbow Complete Right  Result Date: 11/10/2021 CLINICAL DATA:  RIGHT elbow  and shoulder pain, altered mental status in a 80 year old male. EXAM: RIGHT ELBOW - COMPLETE 3+ VIEW COMPARISON:  None FINDINGS: No visible fracture. Degenerative changes about the elbow. Question mild elevation of the anterior fat pad but without distension/anterior bowing. No posterior fat pad. IMPRESSION: No visible fracture. Question mild elevation of the anterior fat pad but without anterior bowing of the fat pad. Favored to be related to degenerative changes, difficult to entirely exclude the possibility of occult fracture, most commonly of the radial head in a skeletally mature patient. No posterior fat pad elevation, correlate with any point tenderness over the radial head. Electronically Signed   By: Zetta Bills M.D.   On: 11/10/2021 13:52   CT HEAD WO CONTRAST (5MM)  Result Date: 11/10/2021 CLINICAL DATA:  Neuro deficit, acute, stroke suspected left leg weakness / R arm weakness / slurred speech for 2 days EXAM: CT HEAD WITHOUT CONTRAST TECHNIQUE: Contiguous axial images were obtained from the base of the skull through the vertex without intravenous contrast. RADIATION DOSE REDUCTION: This exam was performed according to the departmental dose-optimization program which  includes automated exposure control, adjustment of the mA and/or kV according to patient size and/or use of iterative reconstruction technique. COMPARISON:  None. FINDINGS: Suboptimal evaluation due to artifact. Brain: There is no acute intracranial hemorrhage, mass effect, or edema. Gray-white differentiation is preserved. There is no extra-axial fluid collection. Ventricles and sulci are within normal limits in size and configuration. Patchy low-density in the supratentorial white matter is nonspecific but may reflect mild chronic microvascular ischemic changes. Age-indeterminate small vessel infarct at the right caudothalamic groove. Vascular: There is atherosclerotic calcification at the skull base. Skull: Prior suboccipital craniectomy. Sinuses/Orbits: No acute finding. Other: None. IMPRESSION: Degraded by artifact. No acute intracranial hemorrhage or mass effect. Age-indeterminate small vessel infarct at the right caudothalamic groove. Mild chronic microvascular ischemic changes. Electronically Signed   By: Macy Mis M.D.   On: 11/10/2021 13:21    Scheduled Meds:  heparin  5,000 Units Subcutaneous Q8H   insulin aspart  0-5 Units Subcutaneous QHS   insulin aspart  0-9 Units Subcutaneous TID WC   nystatin  1 application Topical BID   Continuous Infusions:  cefTRIAXone (ROCEPHIN)  IV 1 g (11/11/21 0814)   lactated ringers 75 mL/hr at 11/12/21 0035     LOS: 2 days   Time spent: 67min  Mehtaab Mayeda C Magali Bray, DO [facility]  If 7PM-7AM, please contact night-coverage [email or web address]  11/12/2021, 7:11 AM

## 2021-11-13 ENCOUNTER — Inpatient Hospital Stay (HOSPITAL_COMMUNITY): Payer: Medicare Other

## 2021-11-13 DIAGNOSIS — G459 Transient cerebral ischemic attack, unspecified: Secondary | ICD-10-CM | POA: Diagnosis not present

## 2021-11-13 LAB — CBC
HCT: 32 % — ABNORMAL LOW (ref 39.0–52.0)
Hemoglobin: 10.5 g/dL — ABNORMAL LOW (ref 13.0–17.0)
MCH: 29.7 pg (ref 26.0–34.0)
MCHC: 32.8 g/dL (ref 30.0–36.0)
MCV: 90.7 fL (ref 80.0–100.0)
Platelets: 180 10*3/uL (ref 150–400)
RBC: 3.53 MIL/uL — ABNORMAL LOW (ref 4.22–5.81)
RDW: 13.8 % (ref 11.5–15.5)
WBC: 11.4 10*3/uL — ABNORMAL HIGH (ref 4.0–10.5)
nRBC: 0 % (ref 0.0–0.2)

## 2021-11-13 LAB — GLUCOSE, CAPILLARY
Glucose-Capillary: 196 mg/dL — ABNORMAL HIGH (ref 70–99)
Glucose-Capillary: 184 mg/dL — ABNORMAL HIGH (ref 70–99)
Glucose-Capillary: 240 mg/dL — ABNORMAL HIGH (ref 70–99)
Glucose-Capillary: 223 mg/dL — ABNORMAL HIGH (ref 70–99)

## 2021-11-13 LAB — PROCALCITONIN: Procalcitonin: 0.58 ng/mL

## 2021-11-13 LAB — BASIC METABOLIC PANEL
Anion gap: 11 (ref 5–15)
BUN: 81 mg/dL — ABNORMAL HIGH (ref 8–23)
CO2: 23 mmol/L (ref 22–32)
Calcium: 8.5 mg/dL — ABNORMAL LOW (ref 8.9–10.3)
Chloride: 105 mmol/L (ref 98–111)
Creatinine, Ser: 3.14 mg/dL — ABNORMAL HIGH (ref 0.61–1.24)
GFR, Estimated: 19 mL/min — ABNORMAL LOW (ref >60)
Glucose, Bld: 212 mg/dL — ABNORMAL HIGH (ref 70–99)
Potassium: 4.1 mmol/L (ref 3.5–5.1)
Sodium: 139 mmol/L (ref 135–145)

## 2021-11-13 MED ORDER — SENNOSIDES-DOCUSATE SODIUM 8.6-50 MG PO TABS: 1.0000 | ORAL_TABLET | Freq: Every evening | ORAL | Status: DC | PRN

## 2021-11-13 MED ORDER — POLYETHYLENE GLYCOL 3350 17 G PO PACK
17.0000 g | PACK | Freq: Two times a day (BID) | ORAL | Status: DC
  Administered 2021-11-13 – 2021-11-15 (×5): 17 g via ORAL
  Filled 2021-11-13 (×9): qty 1

## 2021-11-13 MED ORDER — ONDANSETRON HCL 4 MG/2ML IJ SOLN
4.0000 mg | Freq: Four times a day (QID) | INTRAMUSCULAR | Status: DC | PRN
  Administered 2021-11-13: 4 mg via INTRAVENOUS

## 2021-11-13 NOTE — Progress Notes (Signed)
HOSPITAL MEDICINE OVERNIGHT EVENT NOTE    Notified by nursing that patient is yet again exhibiting a fever, this time of 103 F.  Patient is additionally exhibiting a yellow mews score as a result.  Per my review of the chart patient had recent urologic instrumentation and is likely suffering from a urinary tract infection.  Patient has been treated with ceftriaxone since hospitalization.  Urine cultures are growing out E. coli with documented sensitivities confirming that the E. coli is sensitive to ceftriaxone.  According to nursing patient has no other physical exam findings or complaints to suggest there is an alternative source of infection.  Therefore, Tylenol has been administered.  I feel that continued intravenous ceftriaxone is appropriate for at least another 24 hours before considering broadening antibiotic coverage.  Nursing to notify me if fever does not abate quickly with Tylenol administration.  Continue to monitor closely.  Vernelle Emerald  MD [facility]

## 2021-11-13 NOTE — Progress Notes (Signed)
Physical Therapy Treatment Patient Details Name: Daniel Ochoa MRN: [id number] DOB: Jan 10, 1941 Today's Date: 11/13/2021   History of Present Illness 81 y/o male presented to ED on 11/10/21 for workup for TIA due to 2 days of slurred speech and weakness. Unable to obtain MRI due to kyphosis. CT head unremarkable. Patient with recent UTI x 2-3 weeks ago. Plan for R wrist ultrasound 2/27. PMH: DM type 2, Alzheimer's, Afib, CKD stage IV    PT Comments    Pt received in supine, agreeable to therapy session with emphasis on supine/seated UE/LE exercises and ROM. Pt continuing to report severe R wrist/forearm pain/tenderness, RUE elevated for edema mgmt. Pt performed therapeutic exercises with good tolerance, needing multimodal cues for proper technique and increased time to perform. Emphasis on repositioning for pressure relief/edema mgmt, exercise instruction for strengthening and importance of continued mobility throughout day with positional changes every 2 hours for skin protection and strengthening. Pt continues to benefit from PT services to progress toward functional mobility goals.   Recommendations for follow up therapy are one component of a multi-disciplinary discharge planning process, led by the attending physician.  Recommendations may be updated based on patient status, additional functional criteria and insurance authorization.  Follow Up Recommendations  Skilled nursing-short term rehab (<3 hours/day)     Assistance Recommended at Discharge Frequent or constant Supervision/Assistance  Patient can return home with the following Two people to help with walking and/or transfers;Two people to help with bathing/dressing/bathroom;Assistance with cooking/housework;Assistance with feeding;Direct supervision/assist for medications management;Direct supervision/assist for financial management;Assist for transportation;Help with stairs or ramp for entrance   Equipment Recommendations   Other (comment) (TBD)    Recommendations for Other Services       Precautions / Restrictions Precautions Precautions: Fall Precaution Comments: R wrist/forearm tender to touch, febrile Restrictions Weight Bearing Restrictions: No     Mobility  Bed Mobility Overal bed mobility: Needs Assistance Bed Mobility: Supine to Sit     Supine to sit: Total assist, HOB elevated     General bed mobility comments: pt cued to pull up on L bed rail with LUE to attempt long sitting in bed, pt able to raise trunk 1-2" forward but needing totalA to achieve upright posture (from bed in upright chair position), defer EOB due to pt febrile and R wrist too painful to mobilize safely with +1 assist only    Transfers                   General transfer comment: defer, pt febrile and per RN he is about to leave for ultrasound imaging apt for his R wrist; Total A for long sit in bed    Ambulation/Gait               General Gait Details: unable at this time   Stairs             Wheelchair Mobility    Modified Rankin (Stroke Patients Only) Modified Rankin (Stroke Patients Only) Pre-Morbid Rankin Score: Moderately severe disability Modified Rankin: Severe disability     Balance Overall balance assessment: Needs assistance Sitting-balance support: No upper extremity supported, Feet supported Sitting balance-Leahy Scale: Poor Sitting balance - Comments: unable to maintain long sitting in bed with LUE support of bed side rail                  Cognition Arousal/Alertness: Awake/alert Behavior During Therapy: Flat affect Overall Cognitive Status: History of cognitive impairments - at baseline Area of Impairment:  Problem solving               Problem Solving: Slow processing, Decreased initiation, Difficulty sequencing, Requires verbal cues, Requires tactile cues General Comments: Per wife he normally can get up and move around on his own, today he cannot with  step by step nor general cues; pt following simple supine UE/LE exercise cues but poor following of cues for repositioning in bed        Exercises Other Exercises Other Exercises: supine BLE AROM: ankle pumps x10 reps Other Exercises: supine BLE AAROM: heel slides, hip abduction x10 reps ea Other Exercises: seated BLE AAROM: LAQ, hip flexion x10 reps (bed in chair position) Other Exercises: pulling forward on L bed rail to long sit position x3 trials (unable to achieve upright unassisted)    General Comments General comments (skin integrity, edema, etc.): Pt skin noted to be hot, temp taken and reading 101.1, RN notified      Pertinent Vitals/Pain Pain Assessment Pain Assessment: Faces Faces Pain Scale: Hurts whole lot Pain Location: RUE (wrist/forearm, gentle shoulder ROM does not provoke pain per pt) Pain Descriptors / Indicators: Grimacing, Guarding Pain Intervention(s): Limited activity within patient's tolerance, Monitored during session, Repositioned, Patient requesting pain meds-RN notified (pt defers ice pack for his wrist)     PT Goals (current goals can now be found in the care plan section) Acute Rehab PT Goals Patient Stated Goal: to feel better PT Goal Formulation: With family Time For Goal Achievement: 12-23-2021 Progress towards PT goals: Progressing toward goals    Frequency    Min 3X/week      PT Plan Current plan remains appropriate    Co-evaluation              AM-PAC PT "6 Clicks" Mobility   Outcome Measure  Help needed turning from your back to your side while in a flat bed without using bedrails?: Total Help needed moving from lying on your back to sitting on the side of a flat bed without using bedrails?: Total Help needed moving to and from a bed to a chair (including a wheelchair)?: Total Help needed standing up from a chair using your arms (e.g., wheelchair or bedside chair)?: Total Help needed to walk in hospital room?: Total Help  needed climbing 3-5 steps with a railing? : Total 6 Click Score: 6    End of Session   Activity Tolerance: Patient limited by pain;Patient limited by fatigue (R wrist pain and pt febrile) Patient left: in bed;with call bell/phone within reach;with bed alarm set (bed in chair posture, B prevalon boots donned, RUE elevated with pillows for edema relief) Nurse Communication: Mobility status;Need for lift equipment (may need hoyer for OOB currently due to pain/fatigue) PT Visit Diagnosis: Unsteadiness on feet (R26.81);Muscle weakness (generalized) (M62.81);Difficulty in walking, not elsewhere classified (R26.2)     Time: 7782-4235 PT Time Calculation (min) (ACUTE ONLY): 21 min  Charges:  $Therapeutic Exercise: 8-22 mins                     Barak Bialecki P., PTA Acute Rehabilitation Services Pager: [phone] Office: [place] 11/13/2021, 5:54 PM

## 2021-11-13 NOTE — Progress Notes (Signed)
°   11/12/21 2307  Notify: Provider  Provider Name/Title Dr Cyd Silence  Date Provider Notified 11/12/21  Time Provider Notified 2328  Notification Type Page  Notification Reason Other (Comment) (PT'S TEMP IS 103)  Provider response No new orders  Date of Provider Response 11/12/21  Time of Provider Response 2355

## 2021-11-13 NOTE — Care Management Important Message (Signed)
Important Message  Patient Details  Name: Daniel Ochoa MRN: [id number] Date of Birth: [birth year]   Medicare Important Message Given:  Yes     Orbie Pyo 11/13/2021, 2:38 PM

## 2021-11-13 NOTE — TOC Progression Note (Signed)
Transition of Care [facility]) - Progression Note    Patient Details  Name: Daniel Ochoa MRN: [id number] Date of Birth: 06-10-1941  Transition of Care [facility]) CM/SW [place], [place] Phone Number: 11/13/2021, 11:59 AM  Clinical Narrative:   CSW contacted [facility], and both facilities can offer a bed. CSW met with wife at bedside to provide update, and she would like to go visit each to make a final decision. CSW to follow.    Expected Discharge Plan: [place] Barriers to Discharge: Continued Medical Work up  Expected Discharge Plan and Services Expected Discharge Plan: [place] arrangements for the past 2 months: Single Family Home                                       Social Determinants of Health (SDOH) Interventions    Readmission Risk Interventions No flowsheet data found.

## 2021-11-13 NOTE — Progress Notes (Signed)
PROGRESS NOTE    Daniel Ochoa  TDV:[id number] DOB: [birth year] DOA: 11/10/2021 PCP: Donnajean Lopes, MD   Brief Narrative:  Daniel Ochoa is a 81 y.o. male with medical history significant of IDDM2 on insulin pump, lumbago, Alzheimer's who presents with transient weakness and ambulatory dysfunction. PCP sent to ED for workup for TIA. Recent history of UTI s/p antibiotics 2-3 weeks ago per wife at bedside. Personal history and ROS limited due to patient's Alzheimer's. Neurology sidelined in the ED, given atypical symptoms will hold off on formal consult pending MRI, CT head unremarkable for acute findings.    2/25 - unable to sit for MRI due to profound kyphosis 2/26-  febrile event overnight 2/27 - febrile again (103) - blood cultures repeated, RUE [place] pending (pain at fistula site)  Assessment & Plan:   Principal Problem:   TIA (transient ischemic attack) Active Problems:   Diabetes mellitus type 2 with complications (HCC)   Chronic kidney disease, stage IV (severe) (HCC)   Alzheimer's disease ([place])   Hypothyroidism   Muscle weakness   Paroxysmal atrial fibrillation (HCC)   Diabetic retinopathy associated with type 2 diabetes mellitus (HCC)  E. coli UTI, recurrent, POA - Follow cultures,continue ceftriaxone x5 days - Ongoing fevers overnight, repeat blood cultures to rule out bacteremia/disseminated infection - Cultures resulted, E. coli sensitive to ceftriaxone - If patient remains febrile despite appropriate antibiotics will discuss with ID for possible secondary infectious work-up or broadening antibiotics - Right forearm ultrasound pending given notable pain; given ongoing fevers will need to rule out infectious process   Unlikely TIA vs CVA, POA -Unfortunately cannot rule out due to inability to obtain MRI  -Atypical weakness, RLE weakness at home, LLE weakness here; right upper extremity weakness today secondary to pain at the wrist near fistula site,  ultrasound pending as above - CT head unremarkable for acute findings - MRI unable to be obtained - sideline Neuro - no repeat imaging given non-acute, bilateral symptoms   IDDM2 on insulin pump, well controlled- Transition to sliding scale for now  CKD4, stable - Follow repeat labs, hold nephrotoxins   DVT prophylaxis: Heparin  Code Status: Full  Family Communication: Wife at bedside   Status is: Inpatient  Dispo: The patient is from: Home              Anticipated d/c is to: To be determined, tentatively SNF given PT OT recommendations              Anticipated d/c date is: 48 to 72 hours              Patient currently not medically stable for discharge  Consultants:  None  Procedures:  None  Antimicrobials:  Ceftriaxone x5 days, may extend given ongoing recurrent fevers, concern for bacteremia/disseminated infection  Subjective: Febrile again overnight otherwise patient appears more awake alert oriented denies nausea vomiting diarrhea headache fevers chills or chest pain.  Notably no bowel movement since admission although patient denies overt constipation or urge to defecate.  Objective: Vitals:   11/12/21 1929 11/12/21 2307 11/13/21 0111 11/13/21 0319  BP: (!) 139/56 (!) 134/58 (!) 130/54 (!) 121/49  Pulse: 84 85 82 73  Resp: 18 16 (!) 24 (!) 22  Temp: 99.8 F (37.7 C) (!) 103 F (39.4 C) 99.7 F (37.6 C) 99.6 F (37.6 C)  TempSrc: Oral Oral Oral Oral  SpO2: 94% 92% 91% 92%  Weight:      Height:  Intake/Output Summary (Last 24 hours) at 11/13/2021 0716 Last data filed at 11/13/2021 0321 Gross per 24 hour  Intake 660 ml  Output 1050 ml  Net -390 ml    Filed Weights   11/10/21 1227  Weight: 114.8 kg    Examination:  General:  Pleasantly resting in bed, No acute distress. HEENT:  Normocephalic atraumatic.  Sclerae nonicteric, noninjected.  Extraocular movements intact bilaterally. Neck:  Without mass or deformity.  Trachea is midline. Lungs:  Clear  to auscultate bilaterally without rhonchi, wheeze, or rales. Heart:  Regular rate and rhythm.  Without murmurs, rubs, or gallops. Abdomen:  Soft, nontender, nondistended.  Without guarding or rebound. Extremities: Bilateral lower extremities strength 4 out of 5, right upper extremity palpation and range of motion limited secondary to pain Vascular: Right forearm radial artery fistula thrill palpable Skin:  Warm and dry, no erythema, no ulcerations.  Data Reviewed: I have personally reviewed following labs and imaging studies  CBC: Recent Labs  Lab 11/10/21 1406 11/10/21 1409 11/11/21 0101 11/12/21 0144 11/13/21 0255  WBC  --  10.8* 10.4 14.9* 11.4*  NEUTROABS  --  6.8  --   --   --   HGB 11.9* 11.8* 12.1* 11.9* 10.5*  HCT 35.0* 37.6* 36.9* 37.1* 32.0*  MCV  --  93.1 90.2 90.5 90.7  PLT  --  178 177 169 161    Basic Metabolic Panel: Recent Labs  Lab 11/10/21 1406 11/10/21 1409 11/11/21 0101 11/12/21 0144 11/13/21 0255  NA 140 140 139 139 139  K 4.4 4.4 4.6 4.8 4.1  CL 105 104 102 106 105  CO2  --  25 22 20* 23  GLUCOSE 146* 153* 237* 236* 212*  BUN 88* 82* 82* 84* 81*  CREATININE 4.40* 4.06* 3.81* 3.48* 3.14*  CALCIUM  --  9.1 9.2 8.8* 8.5*    GFR: Estimated Creatinine Clearance: 23.1 mL/min (A) (by C-G formula based on SCr of 3.14 mg/dL (H)). Liver Function Tests: Recent Labs  Lab 11/10/21 1409  AST 11*  ALT 9  ALKPHOS 50  BILITOT 0.4  PROT 6.7  ALBUMIN 3.0*    No results for input(s): LIPASE, AMYLASE in the last 168 hours. No results for input(s): AMMONIA in the last 168 hours. Coagulation Profile: Recent Labs  Lab 11/10/21 1409  INR 1.0    Cardiac Enzymes: No results for input(s): CKTOTAL, CKMB, CKMBINDEX, TROPONINI in the last 168 hours. BNP (last 3 results) No results for input(s): PROBNP in the last 8760 hours. HbA1C: Recent Labs    11/10/21 1914  HGBA1C 6.0*    CBG: Recent Labs  Lab 11/12/21 0737 11/12/21 1210 11/12/21 1707  11/12/21 2214 11/13/21 0613  GLUCAP 216* 213* 222* 233* 196*    Lipid Profile: No results for input(s): CHOL, HDL, LDLCALC, TRIG, CHOLHDL, LDLDIRECT in the last 72 hours. Thyroid Function Tests: No results for input(s): TSH, T4TOTAL, FREET4, T3FREE, THYROIDAB in the last 72 hours. Anemia Panel: No results for input(s): VITAMINB12, FOLATE, FERRITIN, TIBC, IRON, RETICCTPCT in the last 72 hours. Sepsis Labs: No results for input(s): PROCALCITON, LATICACIDVEN in the last 168 hours.  Recent Results (from the past 240 hour(s))  Resp Panel by RT-PCR (Flu A&B, Covid) Nasopharyngeal Swab     Status: None   Collection Time: 11/10/21  2:52 PM   Specimen: Nasopharyngeal Swab; Nasopharyngeal(NP) swabs in vial transport medium  Result Value Ref Range Status   SARS Coronavirus 2 by RT PCR NEGATIVE NEGATIVE Final    Comment: (NOTE) SARS-CoV-2  target nucleic acids are NOT DETECTED.  The SARS-CoV-2 RNA is generally detectable in upper respiratory specimens during the acute phase of infection. The lowest concentration of SARS-CoV-2 viral copies this assay can detect is 138 copies/mL. A negative result does not preclude SARS-Cov-2 infection and should not be used as the sole basis for treatment or other patient management decisions. A negative result may occur with  improper specimen collection/handling, submission of specimen other than nasopharyngeal swab, presence of viral mutation(s) within the areas targeted by this assay, and inadequate number of viral copies(<138 copies/mL). A negative result must be combined with clinical observations, patient history, and epidemiological information. The expected result is Negative.  Fact Sheet for Patients:  EntrepreneurPulse.com.au  Fact Sheet for Healthcare Providers:  IncredibleEmployment.be  This test is no t yet approved or cleared by the [place] FDA and  has been authorized for detection and/or  diagnosis of SARS-CoV-2 by FDA under an Emergency Use Authorization (EUA). This EUA will remain  in effect (meaning this test can be used) for the duration of the COVID-19 declaration under Section 564(b)(1) of the Act, 21 U.S.C.section 360bbb-3(b)(1), unless the authorization is terminated  or revoked sooner.       Influenza A by PCR NEGATIVE NEGATIVE Final   Influenza B by PCR NEGATIVE NEGATIVE Final    Comment: (NOTE) The Xpert Xpress SARS-CoV-2/FLU/RSV plus assay is intended as an aid in the diagnosis of influenza from Nasopharyngeal swab specimens and should not be used as a sole basis for treatment. Nasal washings and aspirates are unacceptable for Xpert Xpress SARS-CoV-2/FLU/RSV testing.  Fact Sheet for Patients: EntrepreneurPulse.com.au  Fact Sheet for Healthcare Providers: IncredibleEmployment.be  This test is not yet approved or cleared by the [place] FDA and has been authorized for detection and/or diagnosis of SARS-CoV-2 by FDA under an Emergency Use Authorization (EUA). This EUA will remain in effect (meaning this test can be used) for the duration of the COVID-19 declaration under Section 564(b)(1) of the Act, 21 U.S.C. section 360bbb-3(b)(1), unless the authorization is terminated or revoked.  Performed at [facility], [place] [address], [place], [place] [address]   Urine Culture     Status: Abnormal   Collection Time: 11/10/21  4:58 PM   Specimen: Urine, Clean Catch  Result Value Ref Range Status   Specimen Description URINE, CLEAN CATCH  Final   Special Requests   Final    NONE Performed at [facility], [place] [address] [place], [place] [address]    Culture >=100,000 COLONIES/mL ESCHERICHIA COLI (A)  Final   Report Status 11/12/2021 FINAL  Final   Organism ID, Bacteria ESCHERICHIA COLI (A)  Final      Susceptibility   Escherichia coli - MIC*    AMPICILLIN >=32 RESISTANT Resistant     CEFAZOLIN >=64  RESISTANT Resistant     CEFEPIME <=0.12 SENSITIVE Sensitive     CEFTRIAXONE <=0.25 SENSITIVE Sensitive     CIPROFLOXACIN >=4 RESISTANT Resistant     GENTAMICIN 4 SENSITIVE Sensitive     IMIPENEM <=0.25 SENSITIVE Sensitive     NITROFURANTOIN <=16 SENSITIVE Sensitive     TRIMETH/SULFA <=20 SENSITIVE Sensitive     AMPICILLIN/SULBACTAM >=32 RESISTANT Resistant     PIP/TAZO >=128 RESISTANT Resistant     * >=100,000 COLONIES/mL ESCHERICHIA COLI   Radiology Studies: No results found.  Scheduled Meds:  heparin  5,000 Units Subcutaneous Q8H   insulin aspart  0-5 Units Subcutaneous QHS   insulin aspart  0-9  Units Subcutaneous TID WC   levothyroxine  200 mcg Oral Once per day on Mon Tue Wed Thu Fri Sat   And   [START ON 11/19/2021] levothyroxine  300 mcg Oral Once per day on Sun   nystatin  1 application Topical BID   Continuous Infusions:  cefTRIAXone (ROCEPHIN)  IV 1 g (11/12/21 0759)   lactated ringers 75 mL/hr at 11/12/21 2358    LOS: 3 days   Time spent: 77min  Brinklee Cisse C Jashira Cotugno, DO [facility]  If 7PM-7AM, please contact night-coverage [email or web address]  11/13/2021, 7:16 AM

## 2021-11-14 DIAGNOSIS — G459 Transient cerebral ischemic attack, unspecified: Secondary | ICD-10-CM | POA: Diagnosis not present

## 2021-11-14 LAB — CBC
HCT: 33 % — ABNORMAL LOW (ref 39.0–52.0)
Hemoglobin: 10.4 g/dL — ABNORMAL LOW (ref 13.0–17.0)
MCH: 29.1 pg (ref 26.0–34.0)
MCHC: 31.5 g/dL (ref 30.0–36.0)
MCV: 92.2 fL (ref 80.0–100.0)
Platelets: 181 10*3/uL (ref 150–400)
RBC: 3.58 MIL/uL — ABNORMAL LOW (ref 4.22–5.81)
RDW: 13.8 % (ref 11.5–15.5)
WBC: 13.6 10*3/uL — ABNORMAL HIGH (ref 4.0–10.5)
nRBC: 0 % (ref 0.0–0.2)

## 2021-11-14 LAB — GLUCOSE, CAPILLARY
Glucose-Capillary: 182 mg/dL — ABNORMAL HIGH (ref 70–99)
Glucose-Capillary: 176 mg/dL — ABNORMAL HIGH (ref 70–99)
Glucose-Capillary: 207 mg/dL — ABNORMAL HIGH (ref 70–99)
Glucose-Capillary: 172 mg/dL — ABNORMAL HIGH (ref 70–99)
Glucose-Capillary: 185 mg/dL — ABNORMAL HIGH (ref 70–99)

## 2021-11-14 LAB — PROCALCITONIN: Procalcitonin: 0.7 ng/mL

## 2021-11-14 LAB — BASIC METABOLIC PANEL
Anion gap: 12 (ref 5–15)
BUN: 84 mg/dL — ABNORMAL HIGH (ref 8–23)
CO2: 21 mmol/L — ABNORMAL LOW (ref 22–32)
Calcium: 8.3 mg/dL — ABNORMAL LOW (ref 8.9–10.3)
Chloride: 106 mmol/L (ref 98–111)
Creatinine, Ser: 2.87 mg/dL — ABNORMAL HIGH (ref 0.61–1.24)
GFR, Estimated: 22 mL/min — ABNORMAL LOW (ref >60)
Glucose, Bld: 189 mg/dL — ABNORMAL HIGH (ref 70–99)
Potassium: 4.6 mmol/L (ref 3.5–5.1)
Sodium: 139 mmol/L (ref 135–145)

## 2021-11-14 MED ORDER — NYSTATIN 100000 UNIT/GM EX POWD
Freq: Three times a day (TID) | CUTANEOUS | Status: DC
  Administered 2021-11-17: 1 via TOPICAL
  Filled 2021-11-14: qty 15

## 2021-11-14 MED ORDER — SODIUM CHLORIDE 0.9 % IV SOLN
1.0000 g | Freq: Two times a day (BID) | INTRAVENOUS | Status: DC
  Administered 2021-11-14 – 2021-11-17 (×8): 1 g via INTRAVENOUS
  Filled 2021-11-14 (×8): qty 20

## 2021-11-14 NOTE — Progress Notes (Addendum)
PROGRESS NOTE    Daniel Ochoa  JAS:[id number] DOB: 1941-02-09 DOA: 11/10/2021 PCP: Donnajean Lopes, MD   Brief Narrative:  Daniel Ochoa is a 81 y.o. male with medical history significant of IDDM2 on insulin pump, lumbago, Alzheimer's who presents with transient weakness and ambulatory dysfunction. PCP sent to ED for workup for TIA. Recent history of UTI s/p antibiotics 2-3 weeks ago per wife at bedside. Personal history and ROS limited due to patient's Alzheimer's. Neurology sidelined in the ED, given atypical symptoms will hold off on formal consult pending MRI, CT head unremarkable for acute findings.    2/25 - unable to sit for MRI due to profound kyphosis 2/26-  febrile event overnight 2/27 - febrile again (103) - blood cultures repeated, RUE [place] pending (pain at fistula site) 2/28 - febrile again overnight (101) ID consulted - recommend 7 days meropenem  Assessment & Plan:   Principal Problem:   TIA (transient ischemic attack) Active Problems:   Diabetes mellitus type 2 with complications (HCC)   Chronic kidney disease, stage IV (severe) (HCC)   Alzheimer's disease (HCC)   Hypothyroidism   Muscle weakness   Paroxysmal atrial fibrillation (HCC)   Diabetic retinopathy associated with type 2 diabetes mellitus (HCC)  E. coli UTI, recurrent, POA - ID consulted for further insight and recommendations given ongoing fevers - Concern for ESBL per ID - recommend meropenem x7 days - midline to continue at SNF if necessary (only PO option is bactrim and given CKD4 this is not ideal) - Recurrent fevers nightly but downtrending, repeat blood cultures to rule out bacteremia/disseminated infection -preliminary negative - Cultures resulted, E. coli sensitive to ceftriaxone - Right forearm ultrasound negative for any overt fluid collections or abscess   Unlikely TIA vs CVA, POA -Unfortunately cannot formally rule out due to inability to obtain MRI  -Atypical weakness, RLE  weakness at home, LLE weakness here; right upper extremity weakness today secondary to pain at the wrist near fistula site, ultrasound negative - CT head unremarkable for acute findings - MRI unable to be obtained - sideline Neuro - no repeat imaging given non-acute, bilateral symptoms   Mucocutaneous Candida versus intertrigo continue topical fluconazole 3 times daily IDDM2 on insulin pump, well controlled- Transition to sliding scale for now  CKD4, stable - Follow repeat labs, hold nephrotoxins   DVT prophylaxis: Heparin  Code Status: Full  Family Communication: Wife at bedside   Status is: Inpatient  Dispo: The patient is from: Home              Anticipated d/c is to: To be determined, tentatively SNF given PT OT recommendations              Anticipated d/c date is: 24-48 hours              Patient currently IS medically stable for discharge  Consultants:  None  Procedures:  None  Antimicrobials:  Ceftriaxone x5 days  Subjective: Febrile again overnight otherwise; patient closer to baseline mental status today remains markedly weak compared to his baseline looking forward to physical therapy later today  Objective: Vitals:   11/13/21 1749 11/13/21 1933 11/13/21 2314 11/14/21 0304  BP:  (!) 156/63 (!) 133/58 (!) 136/56  Pulse:  75 75 75  Resp:  20 20 20   Temp: (!) 101.1 F (38.4 C) 99.2 F (37.3 C) 98.8 F (37.1 C) 97.6 F (36.4 C)  TempSrc: Oral Oral Oral Oral  SpO2:  93% 93% 91%  Weight:  Height:        Intake/Output Summary (Last 24 hours) at 11/14/2021 0710 Last data filed at 11/13/2021 1700 Gross per 24 hour  Intake 680 ml  Output 700 ml  Net -20 ml    Filed Weights   11/10/21 1227  Weight: 114.8 kg    Examination:  General:  Pleasantly resting in bed, No acute distress. HEENT:  Normocephalic atraumatic.  Sclerae nonicteric, noninjected.  Extraocular movements intact bilaterally. Neck:  Without mass or deformity.  Trachea is midline. Lungs:   Clear to auscultate bilaterally without rhonchi, wheeze, or rales. Heart:  Regular rate and rhythm.  Without murmurs, rubs, or gallops. Abdomen:  Soft, nontender, nondistended.  Without guarding or rebound. Extremities: Bilateral lower extremities strength 4 out of 5, right upper extremity range of motion limited by pain Vascular: Right forearm radial artery fistula thrill palpable Skin:  Warm and dry, no erythema, no ulcerations.  Data Reviewed: I have personally reviewed following labs and imaging studies  CBC: Recent Labs  Lab 11/10/21 1409 11/11/21 0101 11/12/21 0144 11/13/21 0255 11/14/21 0357  WBC 10.8* 10.4 14.9* 11.4* 13.6*  NEUTROABS 6.8  --   --   --   --   HGB 11.8* 12.1* 11.9* 10.5* 10.4*  HCT 37.6* 36.9* 37.1* 32.0* 33.0*  MCV 93.1 90.2 90.5 90.7 92.2  PLT 178 177 169 180 621    Basic Metabolic Panel: Recent Labs  Lab 11/10/21 1409 11/11/21 0101 11/12/21 0144 11/13/21 0255 11/14/21 0357  NA 140 139 139 139 139  K 4.4 4.6 4.8 4.1 4.6  CL 104 102 106 105 106  CO2 25 22 20* 23 21*  GLUCOSE 153* 237* 236* 212* 189*  BUN 82* 82* 84* 81* 84*  CREATININE 4.06* 3.81* 3.48* 3.14* 2.87*  CALCIUM 9.1 9.2 8.8* 8.5* 8.3*    GFR: Estimated Creatinine Clearance: 25.3 mL/min (A) (by C-G formula based on SCr of 2.87 mg/dL (H)). Liver Function Tests: Recent Labs  Lab 11/10/21 1409  AST 11*  ALT 9  ALKPHOS 50  BILITOT 0.4  PROT 6.7  ALBUMIN 3.0*    No results for input(s): LIPASE, AMYLASE in the last 168 hours. No results for input(s): AMMONIA in the last 168 hours. Coagulation Profile: Recent Labs  Lab 11/10/21 1409  INR 1.0    Cardiac Enzymes: No results for input(s): CKTOTAL, CKMB, CKMBINDEX, TROPONINI in the last 168 hours. BNP (last 3 results) No results for input(s): PROBNP in the last 8760 hours. HbA1C: No results for input(s): HGBA1C in the last 72 hours.  CBG: Recent Labs  Lab 11/13/21 0613 11/13/21 1115 11/13/21 1615 11/13/21 2131  11/14/21 0619  GLUCAP 196* 184* 240* 223* 182*    Lipid Profile: No results for input(s): CHOL, HDL, LDLCALC, TRIG, CHOLHDL, LDLDIRECT in the last 72 hours. Thyroid Function Tests: No results for input(s): TSH, T4TOTAL, FREET4, T3FREE, THYROIDAB in the last 72 hours. Anemia Panel: No results for input(s): VITAMINB12, FOLATE, FERRITIN, TIBC, IRON, RETICCTPCT in the last 72 hours. Sepsis Labs: Recent Labs  Lab 11/13/21 0845  PROCALCITON 0.58    Recent Results (from the past 240 hour(s))  Resp Panel by RT-PCR (Flu A&B, Covid) Nasopharyngeal Swab     Status: None   Collection Time: 11/10/21  2:52 PM   Specimen: Nasopharyngeal Swab; Nasopharyngeal(NP) swabs in vial transport medium  Result Value Ref Range Status   SARS Coronavirus 2 by RT PCR NEGATIVE NEGATIVE Final    Comment: (NOTE) SARS-CoV-2 target nucleic acids are NOT DETECTED.  The SARS-CoV-2 RNA is generally detectable in upper respiratory specimens during the acute phase of infection. The lowest concentration of SARS-CoV-2 viral copies this assay can detect is 138 copies/mL. A negative result does not preclude SARS-Cov-2 infection and should not be used as the sole basis for treatment or other patient management decisions. A negative result may occur with  improper specimen collection/handling, submission of specimen other than nasopharyngeal swab, presence of viral mutation(s) within the areas targeted by this assay, and inadequate number of viral copies(<138 copies/mL). A negative result must be combined with clinical observations, patient history, and epidemiological information. The expected result is Negative.  Fact Sheet for Patients:  EntrepreneurPulse.com.au  Fact Sheet for Healthcare Providers:  IncredibleEmployment.be  This test is no t yet approved or cleared by the [place] FDA and  has been authorized for detection and/or diagnosis of SARS-CoV-2 by FDA under an  Emergency Use Authorization (EUA). This EUA will remain  in effect (meaning this test can be used) for the duration of the COVID-19 declaration under Section 564(b)(1) of the Act, 21 U.S.C.section 360bbb-3(b)(1), unless the authorization is terminated  or revoked sooner.       Influenza A by PCR NEGATIVE NEGATIVE Final   Influenza B by PCR NEGATIVE NEGATIVE Final    Comment: (NOTE) The Xpert Xpress SARS-CoV-2/FLU/RSV plus assay is intended as an aid in the diagnosis of influenza from Nasopharyngeal swab specimens and should not be used as a sole basis for treatment. Nasal washings and aspirates are unacceptable for Xpert Xpress SARS-CoV-2/FLU/RSV testing.  Fact Sheet for Patients: EntrepreneurPulse.com.au  Fact Sheet for Healthcare Providers: IncredibleEmployment.be  This test is not yet approved or cleared by the [place] FDA and has been authorized for detection and/or diagnosis of SARS-CoV-2 by FDA under an Emergency Use Authorization (EUA). This EUA will remain in effect (meaning this test can be used) for the duration of the COVID-19 declaration under Section 564(b)(1) of the Act, 21 U.S.C. section 360bbb-3(b)(1), unless the authorization is terminated or revoked.  Performed at [facility], [place] [address], [place], [place] [address]   Urine Culture     Status: Abnormal   Collection Time: 11/10/21  4:58 PM   Specimen: Urine, Clean Catch  Result Value Ref Range Status   Specimen Description URINE, CLEAN CATCH  Final   Special Requests   Final    NONE Performed at [facility], [place] [address], [place], [place] [address]    Culture >=100,000 COLONIES/mL ESCHERICHIA COLI (A)  Final   Report Status 11/12/2021 FINAL  Final   Organism ID, Bacteria ESCHERICHIA COLI (A)  Final      Susceptibility   Escherichia coli - MIC*    AMPICILLIN >=32 RESISTANT Resistant     CEFAZOLIN >=64 RESISTANT Resistant     CEFEPIME <=0.12  SENSITIVE Sensitive     CEFTRIAXONE <=0.25 SENSITIVE Sensitive     CIPROFLOXACIN >=4 RESISTANT Resistant     GENTAMICIN 4 SENSITIVE Sensitive     IMIPENEM <=0.25 SENSITIVE Sensitive     NITROFURANTOIN <=16 SENSITIVE Sensitive     TRIMETH/SULFA <=20 SENSITIVE Sensitive     AMPICILLIN/SULBACTAM >=32 RESISTANT Resistant     PIP/TAZO >=128 RESISTANT Resistant     * >=100,000 COLONIES/mL ESCHERICHIA COLI   Radiology Studies: [place] RT UPPER EXTREM LTD SOFT TISSUE NON VASCULAR  Result Date: 11/13/2021 CLINICAL DATA:  Pain and swelling.  History of AV fistula. EXAM: ULTRASOUND right UPPER EXTREMITY LIMITED TECHNIQUE: Ultrasound examination of the  upper extremity soft tissues was performed in the area of clinical concern. COMPARISON:  None. FINDINGS: Mild inflammation/edema is noted in the soft tissues but no discrete fluid collection to suggest an abscess or hematoma. No soft tissue mass is identified. IMPRESSION: Mild soft tissue edema but no discrete fluid collection or hematoma. Electronically Signed   By: Marijo Sanes M.D.   On: 11/13/2021 20:04    Scheduled Meds:  heparin  5,000 Units Subcutaneous Q8H   insulin aspart  0-5 Units Subcutaneous QHS   insulin aspart  0-9 Units Subcutaneous TID WC   levothyroxine  200 mcg Oral Once per day on Mon Tue Wed Thu Fri Sat   And   [START ON 11/19/2021] levothyroxine  300 mcg Oral Once per day on Sun   nystatin  1 application Topical BID   polyethylene glycol  17 g Oral BID   Continuous Infusions:  cefTRIAXone (ROCEPHIN)  IV Stopped (11/13/21 2155)   lactated ringers 75 mL/hr at 11/14/21 0156    LOS: 4 days   Time spent: 57min  Manna Gose C Kemi Gell, DO [facility]  If 7PM-7AM, please contact night-coverage [email or web address]  11/14/2021, 7:10 AM

## 2021-11-14 NOTE — Evaluation (Signed)
Clinical/Bedside Swallow Evaluation Patient Details  Name: Daniel Ochoa MRN: [id number] Date of Birth: 1940-12-20  Today's Date: 11/14/2021 Time: SLP Start Time (ACUTE ONLY): 1600 SLP Stop Time (ACUTE ONLY): 2505 SLP Time Calculation (min) (ACUTE ONLY): 15 min  Past Medical History:  Past Medical History:  Diagnosis Date   Acute deep vein thrombosis (DVT) of tibial vein of right lower extremity ([place]) 04/13/2014   Acute pulmonary embolism ([place]) 04/15/2014   Arthritis    Chronic fatigue    Chronic kidney disease, stage IV (severe) (HCC)    Complication of anesthesia    one surgery took 4 people to hold him down 2000   Constipation    Diabetic peripheral neuropathy ([place]) 10/26/2015   DM (diabetes mellitus), type 2, uncontrolled, with renal complications    [facility]   DVT (deep venous thrombosis) ([place])    RLE DVT with intermediate risk VQ scan for PE 03/2014   Dyspnea    Essential hypertension    Fibromyalgia    Hypothyroidism    Hypoxia 04/13/2014   Neuropathy    Obesity    OSA on CPAP    Pulmonary embolism ([place]) 04/13/2014   VQ 04/13/14 intermediate assoc with R DVT > on coumadin since Echo 04/14/14 > PA peak pressure: 89 mm Hg (S).    Past Surgical History:  Past Surgical History:  Procedure Laterality Date   AV FISTULA PLACEMENT Left 04/18/2017   Procedure: ARTERIOVENOUS (AV) FISTULA CREATION;  Surgeon: Angelia Mould, MD;  Location: [place] [facility];  Service: Vascular;  Laterality: Left;   [place] Right 11/14/2020   Procedure: BASCILIC VEIN TRANSPOSITION 1ST STAGE RIGHT;  Surgeon: Angelia Mould, MD;  Location: [place];  Service: Vascular;  Laterality: Right;   [place] Right 01/05/2021   Procedure: RIGHT SECOND STAGE [place];  Surgeon: Serafina Mitchell, MD;  Location: [facility] OR;  Service: Vascular;  Laterality: Right;   CERVICAL LAMINECTOMY     LIGATION OF ARTERIOVENOUS  FISTULA Left 06/01/2021   Procedure:  LIGATION OF LEFT ARM FISTULA;  Surgeon: Serafina Mitchell, MD;  Location: [facility] OR;  Service: Vascular;  Laterality: Left;   LUMBAR FUSION     x 2   ROTATOR CUFF REPAIR Right    THYROIDECTOMY, PARTIAL     HPI:  81 y/o male presented to ED on 11/10/21 for workup for TIA due to 2 days of slurred speech and weakness. Unable to obtain MRI due to kyphosis. CT head unremarkable. Patient with recent UTI x 2-3 weeks ago.  PMH: DM type 2, Alzheimer's, Afib, CKD stage IV    Assessment / Plan / Recommendation  Clinical Impression  Patient was assessed for swallow function at bedside and although he exhibited one instance of mildly delayed throat clearing after first couple of sips of liquids, he did not exhibit any further s/s even when taking sequential straw sips. Swallow initiation appeared timely and patient's voice remained clear throughout. SLP is not recommending further intervention at this time but please reorder if dysphagia concerns arise. SLP Visit Diagnosis: Dysphagia, unspecified (R13.10)    Aspiration Risk  No limitations;Mild aspiration risk    Diet Recommendation Regular;Thin liquid   Liquid Administration via: Straw;Cup Medication Administration: Whole meds with liquid Supervision: Patient able to self feed Compensations: Slow rate;Small sips/bites Postural Changes: Seated upright at 90 degrees    Other  Recommendations Oral Care Recommendations: Oral care BID    Recommendations for follow up therapy are one component of  a multi-disciplinary discharge planning process, led by the attending physician.  Recommendations may be updated based on patient status, additional functional criteria and insurance authorization.  Follow up Recommendations No SLP follow up      Assistance Recommended at Discharge None  Functional Status Assessment Patient has had a recent decline in their functional status and demonstrates the ability to make significant improvements in function in a reasonable  and predictable amount of time.  Frequency and Duration     N/A       Prognosis   N/A     Swallow Study   General Date of Onset: 11/14/21 HPI: 81 y/o male presented to ED on 11/10/21 for workup for TIA due to 2 days of slurred speech and weakness. Unable to obtain MRI due to kyphosis. CT head unremarkable. Patient with recent UTI x 2-3 weeks ago.  PMH: DM type 2, Alzheimer's, Afib, CKD stage IV Type of Study: Bedside Swallow Evaluation Previous Swallow Assessment: none found Diet Prior to this Study: Regular;Thin liquids Temperature Spikes Noted: No Respiratory Status: Room air History of Recent Intubation: No Behavior/Cognition: Alert;Cooperative;Pleasant mood Oral Cavity Assessment: Within Functional Limits Oral Care Completed by SLP: No Oral Cavity - Dentition: Adequate natural dentition Vision: Functional for self-feeding Self-Feeding Abilities: Able to feed self Patient Positioning: Upright in bed Baseline Vocal Quality: Normal Volitional Cough: Strong Volitional Swallow: Able to elicit    Oral/Motor/Sensory Function Overall Oral Motor/Sensory Function: Within functional limits   Ice Chips     Thin Liquid Thin Liquid: Impaired Presentation: Straw;Self Fed Pharyngeal  Phase Impairments: Other (comments) Other Comments: one instance of mildly delayed throat clear when drinking first couple sips but subsequent single and sequential swallows did not result in any overt s/s aspiration or penetration    Nectar Thick     Honey Thick     Puree Puree: Not tested   Solid     Solid: Not tested     Sonia Baller, MA, CCC-SLP Speech Therapy

## 2021-11-14 NOTE — Progress Notes (Signed)
Occupational Therapy Treatment Patient Details Name: Daniel Ochoa MRN: [id number] DOB: 12-05-1940 Today's Date: 11/14/2021   History of present illness 81 y/o male presented to ED on 11/10/21 for workup for TIA due to 2 days of slurred speech and weakness. Unable to obtain MRI due to kyphosis. CT head unremarkable. Patient with recent UTI x 2-3 weeks ago. Plan for R wrist ultrasound 2/27. PMH: DM type 2, Alzheimer's, Afib, CKD stage IV   OT comments  [name] is making limited progress, he reports his RUE pain is slightly better this date. Started session with AAROM and PROM exercises listed below, pt tolerated will with increased noted as we progressed. Simulated self feeding with RUE (dominate), ultimately max A for task. Pt required max A for rolling L and R with cues fro positioning and BLE involvement. Pt is stiff/rigid and will require +2 to move toward EOB, possibly attempt OOB with lift equipment if pt tolerates. OT to continue to follow acutely. D/c recommendation remains appropriate.    Recommendations for follow up therapy are one component of a multi-disciplinary discharge planning process, led by the attending physician.  Recommendations may be updated based on patient status, additional functional criteria and insurance authorization.    Follow Up Recommendations  Skilled nursing-short term rehab (<3 hours/day)    Assistance Recommended at Discharge Frequent or constant Supervision/Assistance  Patient can return home with the following  Two people to help with bathing/dressing/bathroom;Two people to help with walking and/or transfers;Assistance with feeding;Help with stairs or ramp for entrance;Assist for transportation;Assistance with cooking/housework;Direct supervision/assist for financial management;Direct supervision/assist for medications management   Equipment Recommendations  Other (comment)    Recommendations for Other Services      Precautions / Restrictions  Precautions Precautions: Fall Precaution Comments: R wrist/forearm tender to touch, febrile Restrictions Weight Bearing Restrictions: No       Mobility Bed Mobility Overal bed mobility: Needs Assistance Bed Mobility: Supine to Sit Rolling: Max assist         General bed mobility comments: with cues pt able to assist py pushing throughout LEs    Transfers                   General transfer comment: defer to safety - may need to consider lift equipment     Balance Overall balance assessment: Needs assistance Sitting-balance support: No upper extremity supported, Feet supported Sitting balance-Leahy Scale: Poor Sitting balance - Comments: unable to maintain long sitting, unable to attempt EOB                                   ADL either performed or assessed with clinical judgement   ADL Overall ADL's : Needs assistance/impaired Eating/Feeding: Maximal assistance;Bed level Eating/Feeding Details (indicate cue type and reason): practicing using dom. hand with elbow supported and increased assist                                 Functional mobility during ADLs: Maximal assistance General ADL Comments: Pt extremely rigid and stiff, limited by pain    Extremity/Trunk Assessment Upper Extremity Assessment Upper Extremity Assessment: LUE deficits/detail RUE Deficits / Details: moves elbow wrist and hand throughout abotu 50% of ROM with cues, grimacing but denies pain. shoulder limited to ~20* flexion & abduction (wife states this is baseline) RUE: Unable to fully assess due to pain  RUE Coordination: decreased fine motor;decreased gross motor LUE Deficits / Details: should limited to ~20* flexion & abduction (baseline?) LUE Coordination: decreased fine motor;decreased gross motor   Lower Extremity Assessment Lower Extremity Assessment: Defer to PT evaluation        Vision   Vision Assessment?: No apparent visual deficits    Perception Perception Perception: Not tested   Praxis Praxis Praxis: Not tested    Cognition Arousal/Alertness: Awake/alert Behavior During Therapy: Flat affect Overall Cognitive Status: History of cognitive impairments - at baseline Area of Impairment: Problem solving                             Problem Solving: Slow processing, Decreased initiation, Difficulty sequencing, Requires verbal cues, Requires tactile cues General Comments: flat affect throughout and requires incr time and repetition to follow simple commands.        Exercises Exercises: Other exercises Other Exercises Other Exercises: R hand gross grasp & extension 5x Other Exercises: R wrist flexion and extension 5x Other Exercises: R elbow flexion and extension 5x Other Exercises: R shoulder flexion and abduction, 10x Other Exercises: Shoulder horizontal abduction with with elbow flex/ext to bring hand to mouth (simulating eating)       General Comments VSS on RA, wife present    Pertinent Vitals/ Pain       Pain Assessment Pain Assessment: Faces Faces Pain Scale: Hurts whole lot Pain Location: RUE (wrist/forearm, gentle shoulder ROM does not provoke pain per pt) Pain Descriptors / Indicators: Grimacing, Guarding Pain Intervention(s): Monitored during session   Frequency  Min 2X/week        Progress Toward Goals  OT Goals(current goals can now be found in the care plan section)  Progress towards OT goals: Progressing toward goals  Acute Rehab OT Goals Patient Stated Goal: less pain OT Goal Formulation: With family Time For Goal Achievement: 12/12/21 Potential to Achieve Goals: Good ADL Goals Pt Will Perform Grooming: with set-up;with supervision;sitting Pt Will Perform Upper Body Bathing: with min assist;sitting Pt Will Transfer to Toilet: with min assist;squat pivot transfer;stand pivot transfer;bedside commode Pt/caregiver will Perform Home Exercise Program: Right Upper  extremity;Increased ROM;Increased strength Additional ADL Goal #1: Pt will be Min A in and OOB for basic ADLs  Plan Discharge plan remains appropriate       AM-PAC OT "6 Clicks" Daily Activity     Outcome Measure   Help from another person eating meals?: A Little Help from another person taking care of personal grooming?: A Lot Help from another person toileting, which includes using toliet, bedpan, or urinal?: Total Help from another person bathing (including washing, rinsing, drying)?: A Lot Help from another person to put on and taking off regular upper body clothing?: Total Help from another person to put on and taking off regular lower body clothing?: Total 6 Click Score: 10    End of Session    OT Visit Diagnosis: Unsteadiness on feet (R26.81);Other abnormalities of gait and mobility (R26.89);Muscle weakness (generalized) (M62.81);Pain;Other symptoms and signs involving cognitive function Pain - Right/Left: Right Pain - part of body: Arm   Activity Tolerance Patient tolerated treatment well;Patient limited by pain   Patient Left in bed;with call bell/phone within reach;with bed alarm set;with family/visitor present   Nurse Communication Mobility status        Time: 6761-9509 OT Time Calculation (min): 26 min  Charges: OT General Charges $OT Visit: 1 Visit OT Treatments $Therapeutic Activity: 8-22 mins $  Therapeutic Exercise: 8-22 mins  Rohnan Bartleson A Delma Drone 11/14/2021, 1:41 PM

## 2021-11-14 NOTE — Progress Notes (Signed)
Inpatient Diabetes Program Recommendations  AACE/ADA: New Consensus Statement on Inpatient Glycemic Control (2015)  Target Ranges:  Prepandial:   less than 140 mg/dL      Peak postprandial:   less than 180 mg/dL (1-2 hours)      Critically ill patients:  140 - 180 mg/dL   Lab Results  Component Value Date   GLUCAP 176 (H) 11/14/2021   HGBA1C 6.0 (H) 11/10/2021    Review of Glycemic Control  Latest Reference Range & Units 11/13/21 06:13 11/13/21 11:15 11/13/21 16:15 11/13/21 21:31 11/14/21 06:19  Glucose-Capillary 70 - 99 mg/dL 196 (H) 184 (H) 240 (H) 223 (H) 182 (H)  (H): Data is abnormally high  Diabetes history: DM2 Outpatient Diabetes medications: Omnipod insulin pump  Current orders for Inpatient glycemic control: Novolog 0-9 units correction tid + hs 0-5 units  Inpatient Diabetes Program Recommendations:   Spoke with wife regarding diabetes management. Patient's insulin pump is @ home and does not know settings. Shared patient may be discharged from the hospital today. Since patient is unable to care for insulin pump by himself discussed with wife will continue to receive insulin injections and CBG checks without pump for now. Wife acknowledged understanding.  Thank you, Nani Gasser. Les Longmore, RN, MSN, CDE  Diabetes Coordinator Inpatient Glycemic Control Team Team Pager [phone] (8am-5pm) 11/14/2021 10:32 AM

## 2021-11-15 ENCOUNTER — Inpatient Hospital Stay (HOSPITAL_COMMUNITY): Payer: Medicare Other

## 2021-11-15 DIAGNOSIS — G309 Alzheimer's disease, unspecified: Secondary | ICD-10-CM

## 2021-11-15 DIAGNOSIS — T82898A Other specified complication of vascular prosthetic devices, implants and grafts, initial encounter: Secondary | ICD-10-CM

## 2021-11-15 DIAGNOSIS — M6281 Muscle weakness (generalized): Secondary | ICD-10-CM

## 2021-11-15 DIAGNOSIS — N184 Chronic kidney disease, stage 4 (severe): Secondary | ICD-10-CM

## 2021-11-15 DIAGNOSIS — I480 Paroxysmal atrial fibrillation: Secondary | ICD-10-CM

## 2021-11-15 DIAGNOSIS — N185 Chronic kidney disease, stage 5: Secondary | ICD-10-CM | POA: Diagnosis not present

## 2021-11-15 DIAGNOSIS — F0280 Dementia in other diseases classified elsewhere without behavioral disturbance: Secondary | ICD-10-CM

## 2021-11-15 DIAGNOSIS — E118 Type 2 diabetes mellitus with unspecified complications: Secondary | ICD-10-CM

## 2021-11-15 LAB — CBC
HCT: 31.9 % — ABNORMAL LOW (ref 39.0–52.0)
Hemoglobin: 10.5 g/dL — ABNORMAL LOW (ref 13.0–17.0)
MCH: 30.1 pg (ref 26.0–34.0)
MCHC: 32.9 g/dL (ref 30.0–36.0)
MCV: 91.4 fL (ref 80.0–100.0)
Platelets: 190 10*3/uL (ref 150–400)
RBC: 3.49 MIL/uL — ABNORMAL LOW (ref 4.22–5.81)
RDW: 14 % (ref 11.5–15.5)
WBC: 11.7 10*3/uL — ABNORMAL HIGH (ref 4.0–10.5)
nRBC: 0 % (ref 0.0–0.2)

## 2021-11-15 LAB — BASIC METABOLIC PANEL
Anion gap: 11 (ref 5–15)
BUN: 84 mg/dL — ABNORMAL HIGH (ref 8–23)
CO2: 23 mmol/L (ref 22–32)
Calcium: 8.3 mg/dL — ABNORMAL LOW (ref 8.9–10.3)
Chloride: 106 mmol/L (ref 98–111)
Creatinine, Ser: 2.86 mg/dL — ABNORMAL HIGH (ref 0.61–1.24)
GFR, Estimated: 22 mL/min — ABNORMAL LOW (ref >60)
Glucose, Bld: 209 mg/dL — ABNORMAL HIGH (ref 70–99)
Potassium: 4.4 mmol/L (ref 3.5–5.1)
Sodium: 140 mmol/L (ref 135–145)

## 2021-11-15 LAB — GLUCOSE, CAPILLARY
Glucose-Capillary: 186 mg/dL — ABNORMAL HIGH (ref 70–99)
Glucose-Capillary: 241 mg/dL — ABNORMAL HIGH (ref 70–99)
Glucose-Capillary: 212 mg/dL — ABNORMAL HIGH (ref 70–99)
Glucose-Capillary: 192 mg/dL — ABNORMAL HIGH (ref 70–99)

## 2021-11-15 LAB — PROCALCITONIN: Procalcitonin: 0.51 ng/mL

## 2021-11-15 MED ORDER — FUROSEMIDE 40 MG PO TABS
80.0000 mg | ORAL_TABLET | Freq: Every day | ORAL | Status: DC
  Administered 2021-11-16 – 2021-11-17 (×2): 80 mg via ORAL
  Filled 2021-11-15 (×2): qty 2

## 2021-11-15 MED ORDER — FUROSEMIDE 40 MG PO TABS
40.0000 mg | ORAL_TABLET | Freq: Every day | ORAL | Status: DC
  Administered 2021-11-15 – 2021-11-17 (×3): 40 mg via ORAL
  Filled 2021-11-15 (×3): qty 1

## 2021-11-15 MED ORDER — FUROSEMIDE 40 MG PO TABS: 40.0000 mg | ORAL_TABLET | ORAL | Status: DC

## 2021-11-15 NOTE — TOC Progression Note (Signed)
Transition of Care (TOC) - Progression Note  ? ? ?Patient Details  ?Name: Daniel Ochoa ?MRN: [id number] ?Date of Birth: 11/17/1940 ? ?Transition of Care (TOC) CM/SW Contact  ?Geralynn Ochs, LCSW ?Phone Number: ?11/15/2021, 2:11 PM ? ?Clinical Narrative:   CSW met with patient's wife at bedside to discuss [facility] offer. Wife would like to accept bed at [facility]. CSW discussed insurance coverage and answered other questions. [facility] [facility] will have a bed available tomorrow, pending medical stability. CSW to follow. ? ? ? ?Expected Discharge Plan: [place] ?Barriers to Discharge: Continued Medical Work up ? ?Expected Discharge Plan and Services ?Expected Discharge Plan: [place] ?  ?  ?  ?Living arrangements for the past 2 months: [place] ?                ?  ?  ?  ?  ?  ?  ?  ?  ?  ?  ? ? ?Social Determinants of Health (SDOH) Interventions ?  ? ?Readmission Risk Interventions ?No flowsheet data found. ? ?

## 2021-11-15 NOTE — Progress Notes (Signed)
Orthopedic Tech Progress Note ?Patient Details:  ?Loi Rennaker Holst ?02/17/1941 ?[id number] ? ?Ortho Devices ?Type of Ortho Device: Thumb velcro splint ?Ortho Device/Splint Location: RUE ?Ortho Device/Splint Interventions: Ordered, Application, Adjustment ?  ?Post Interventions ?Patient Tolerated: Well ?Instructions Provided: Adjustment of device, Care of device ? ?Vernona Rieger ?11/15/2021, 1:17 PM ? ?

## 2021-11-15 NOTE — TOC Progression Note (Signed)
Transition of Care [facility]) - Progression Note    Patient Details  Name: Daniel Ochoa MRN: [id number] Date of Birth: Jan 13, 1941  Transition of Care [facility]) CM/SW [place], [place] Phone Number: 11/15/2021, 2:08 PM  Clinical Narrative:   CSW met with patient's wife at bedside this morning to discuss SNF options. Patient's wife said she was not interested in [place], possibly [place], but also wanted CSW to reach out to [facility]. [facility] has offered, [facility] has not. CSW went back to patient's room to discuss with wife, but she had gone for the day. CSW called and left a voicemail for her to call back, awaiting response.    Expected Discharge Plan: [place] Barriers to Discharge: Continued Medical Work up  Expected Discharge Plan and Services Expected Discharge Plan: [place] arrangements for the past 2 months: Single Family Home                                       Social Determinants of Health (SDOH) Interventions    Readmission Risk Interventions No flowsheet data found.

## 2021-11-15 NOTE — Progress Notes (Signed)
Physical Therapy Treatment ?Patient Details ?Name: Daniel Ochoa ?MRN: [id number] ?DOB: July 21, 1941 ?Today's Date: 11/15/2021 ? ? ?History of Present Illness 81 y/o male presented to ED on 11/10/21 for workup for TIA due to 2 days of slurred speech and weakness. Unable to obtain MRI due to kyphosis. CT head unremarkable. Patient with recent UTI x 2-3 weeks ago. Plan for R wrist ultrasound 2/27. PMH: DM type 2, Alzheimer's, Afib, CKD stage IV ? ?  ?PT Comments  ? ? Pt was notable confused on arrival, but willing to participate.  Emphasis on Warm up exercise, pericare in the bed requiring rolling, transitions to EOB, sit to stand/safety and transfer to the recliner. ?   ?Recommendations for follow up therapy are one component of a multi-disciplinary discharge planning process, led by the attending physician.  Recommendations may be updated based on patient status, additional functional criteria and insurance authorization. ? ?Follow Up Recommendations ? Skilled nursing-short term rehab (<3 hours/day) ?  ?  ?Assistance Recommended at Discharge Frequent or constant Supervision/Assistance  ?Patient can return home with the following Two people to help with walking and/or transfers;Two people to help with bathing/dressing/bathroom;Assistance with cooking/housework;Assistance with feeding;Direct supervision/assist for medications management;Direct supervision/assist for financial management;Assist for transportation;Help with stairs or ramp for entrance ?  ?Equipment Recommendations ? Other (comment) (TBD)  ?  ?Recommendations for Other Services   ? ? ?  ?Precautions / Restrictions Precautions ?Precautions: Fall ?Precaution Comments: R wrist/forearm tender to touch, febrile ?Restrictions ?Weight Bearing Restrictions: No  ?  ? ?Mobility ? Bed Mobility ?Overal bed mobility: Needs Assistance ?Bed Mobility: Rolling, Sidelying to Sit ?Rolling: Max assist ?Sidelying to sit: Max assist, +2 for safety/equipment ?  ?  ?  ?General  bed mobility comments: pt is guarded overall with any transitions,   truncal stiffness is hindering the roll and transition via L elbow to sitting. ?  ? ?Transfers ?Overall transfer level: Needs assistance ?  ?Transfers: Sit to/from Stand, Bed to chair/wheelchair/BSC ?Sit to Stand: Max assist, +2 safety/equipment ?Stand pivot transfers: Mod assist, Max assist, +2 safety/equipment ?  ?  ?  ?  ?General transfer comment: pt is initially resistant, but as task progresses, pt in less reluctant and starts to give a little assist. ?  ? ?Ambulation/Gait ?  ?  ?  ?  ?  ?  ?  ?  ? ? ?Stairs ?  ?  ?  ?  ?  ? ? ?Wheelchair Mobility ?  ? ?Modified Rankin (Stroke Patients Only) ?Modified Rankin (Stroke Patients Only) ?Modified Rankin: Severe disability ? ? ?  ?Balance Overall balance assessment: Needs assistance ?  ?Sitting balance-Leahy Scale: Fair ?Sitting balance - Comments: able to attain sitting EOB without UE or external assist ?  ?  ?  ?Standing balance comment: pt was able to accept w/brearing on eah LE, R> L.  As task progressee, pt gave more assist to the stand and transfer. ?  ?  ?  ?  ?  ?  ?  ?  ?  ?  ?  ?  ? ?  ?Cognition Arousal/Alertness: Awake/alert ?Behavior During Therapy: Flat affect, WFL for tasks assessed/performed ?Overall Cognitive Status: History of cognitive impairments - at baseline ?  ?  ?  ?  ?  ?  ?  ?  ?  ?  ?  ?  ?  ?  ?  ?Problem Solving: Slow processing, Decreased initiation, Difficulty sequencing, Requires verbal cues, Requires tactile cues ?  ?  ?  ? ?  ?  Exercises Other Exercises ?Other Exercises: warm up bil LE in hip/knee flexion with AA to graded resistance as pt "warmed up" with less pain. ?Other Exercises: Warm up UE exercise, AAROM R Painful UE and graded resistance to L UE. ? ?  ?General Comments General comments (skin integrity, edema, etc.):  (vss,  wife present throughout session) ?  ?  ? ?Pertinent Vitals/Pain Pain Assessment ?Faces Pain Scale: Hurts whole lot ?Pain Location: RUE  (wrist/forearm, gentle shoulder ROM does not provoke pain per pt)  bil knee ROM ?Pain Descriptors / Indicators: Grimacing, Guarding ?Pain Intervention(s): Monitored during session  ? ? ?Home Living   ?  ?  ?  ?  ?  ?  ?  ?  ?  ?   ?  ?Prior Function    ?  ?  ?   ? ?PT Goals (current goals can now be found in the care plan section) Acute Rehab PT Goals ?Patient Stated Goal: to feel better ?PT Goal Formulation: With family ?Time For Goal Achievement: 2021-12-15 ?Potential to Achieve Goals: Fair ?Progress towards PT goals: Progressing toward goals ? ?  ?Frequency ? ? ? Min 3X/week ? ? ? ?  ?PT Plan Current plan remains appropriate  ? ? ?Co-evaluation   ?  ?  ?  ?  ? ?  ?AM-PAC PT "6 Clicks" Mobility   ?Outcome Measure ? Help needed turning from your back to your side while in a flat bed without using bedrails?: Total ?Help needed moving from lying on your back to sitting on the side of a flat bed without using bedrails?: Total ?Help needed moving to and from a bed to a chair (including a wheelchair)?: Total ?Help needed standing up from a chair using your arms (e.g., wheelchair or bedside chair)?: Total ?Help needed to walk in hospital room?: Total ?Help needed climbing 3-5 steps with a railing? : Total ?6 Click Score: 6 ? ?  ?End of Session   ?Activity Tolerance: Patient limited by pain ?Patient left: in chair;with call bell/phone within reach;with chair alarm set (lift pad.) ?Nurse Communication: Mobility status;Need for lift equipment ?PT Visit Diagnosis: Unsteadiness on feet (R26.81);Muscle weakness (generalized) (M62.81);Difficulty in walking, not elsewhere classified (R26.2) ?  ? ? ?Time: 6945-0388 ?PT Time Calculation (min) (ACUTE ONLY): 42 min ? ?Charges:  $Therapeutic Exercise: 8-22 mins ?$Therapeutic Activity: 8-22 mins ?$Self Care/Home Management: 8-22          ?          ? ?11/15/2021 ? ?Ginger Carne., PT ?Acute Rehabilitation Services ?[phone]  (pager) ?[phone]  (office) ? ? ?Tessie Fass Danique H[place]ough ?11/15/2021,  12:57 PM ? ?

## 2021-11-15 NOTE — Progress Notes (Addendum)
Progress Note  Patient: Daniel Ochoa WVP:[id number] DOB: 1941/01/11  DOA: 11/10/2021  DOS: 11/15/2021    Brief hospital course: Daniel Ochoa is a 81 y.o. male with a history of IDT2DM on insulin pump, stage IV CKD s/p RUE AVF not yet on HD, among others who presented to the ED 2/24 for weakness, inability to walk, admitted for TIA work up with negative CT and inability to perform MRI due to kyphosis. He also had fevers and found to have E. coli UTI.  Assessment and Plan: E. coli UTI, recurrent, POA: Broadened abx to meropenem despite culture susceptibility to CTX. Fever curve, leukocytosis, PCT showing improvement.  - Continue meropenem, empiric 7 day course is planned, would place midline if stable for disposition to SNF prior to completing therapy.  - Trend CBC, fever. Last febrile 100.55F < 24 hours ago.     TIA vs CVA, POA: Pt has persistent, acute right-sided deficits on exam, though the exam's specificity is affected by cognitive impairment.  - Initial CT head on 2/24 without acute findings. Will repeat CT since kyphosis precludes MRI.    Right thumb swelling and pain with IP swelling. No hx gout or known injury.  - Start with XR - CKD and infection make systemic antiinflammatory options limited if gout is suspected. That said, we have recurrent fevers despite CTX, with no MRSA coverage to date. Suspect septic arthritis would be more severe presentation, but is not ruled out. Consulted orthopedics for consideration of joint aspiration.   Mucocutaneous candida versus intertrigo continue topical fluconazole 3 times daily  IDT2DM:  - Does not know insulin pump settings, so giving SSI for now. Will recommend either this or reinitiation of pump at discharge.   Stage IV CKD: Followed by Dr. Moshe Cipro not yet on HD. No urgent indications.  - Avoid nephrotoxins, including NSAIDs - Restart home lasix  RUE swelling: Perhaps due to hemiparesis-related immobility, though patient  had R brachiocephalic AVF placed Dec 5462 at [facility]. - Pt followed by [facility] previously, has history of failed R brachiobasilic AVF as well as L brachiocephalic AVF that failed due to subclavian stenosis and subsequent string venous hypertension.  - Vascular surgery consulted given relative acuity of swelling, history of swelling/subclavian stenosis with LUE fistula previously.  - Right forearm ultrasound negative for any overt fluid collections or abscess  History of RLE DVT, PE s/p 3 months coumadin in 2015.  - Heparin VTE ppx  Hypothyroidism:  - Continue custom high dose synthroid. Last TSH wnl.  Obesity: Estimated body mass index is 39.64 kg/m as calculated from the following:   Height as of this encounter: 5\' 7"  (1.702 m).   Weight as of this encounter: 114.8 kg.   Subjective: Pt confused, wife at bedside reporting stable R arm swelling and slight worsening of red, painful right thumb over past 24 hours. States previous reports of left side weakness may have been due to pt's confusion, as to her he has only had right sided weakness which has been stable for the past 1 week. Was walking previously, not since the abrupt onset of symptoms.  Objective: Vitals:   11/14/21 2017 11/15/21 0012 11/15/21 0358 11/15/21 0800  BP: (!) 141/53 (!) 146/58 (!) 147/58 (!) 151/61  Pulse: 71 73 74 75  Resp: 19 18 17 16   Temp: 98.8 F (37.1 C) 99.3 F (37.4 C) 98.8 F (37.1 C) 98.7 F (37.1 C)  TempSrc: Oral Oral Oral Oral  SpO2: 92% 93% 93% 94%  Weight:      Height:       Gen: Elderly male in no distress Pulm: Nonlabored breathing room air.  CV: Regular rate and rhythm. No murmur, rub, or gallop. No JVD, trace dependent edema. GI: Abdomen soft, non-tender, non-distended, with normoactive bowel sounds.  Ext: Warm, dry, bilateral boots in place. R arm with 1/5 strength throughout, tender to palpation at swollen slightly pink R thumb IP joint. Multiple chronic toe deformities Skin: No other  rashes, lesions or ulcers on visualized skin. Neuro: Alert and incompletely oriented. Hardly can move R arm to command, sensation intact. Proximal bilateral LE weakness on exam. Psych: Judgement and insight appear impaired. Mood euthymic & affect congruent. Behavior is appropriate.    Data Personally reviewed: CBC: Recent Labs  Lab 11/10/21 1409 11/11/21 0101 11/12/21 0144 11/13/21 0255 11/14/21 0357 11/15/21 0136  WBC 10.8* 10.4 14.9* 11.4* 13.6* 11.7*  NEUTROABS 6.8  --   --   --   --   --   HGB 11.8* 12.1* 11.9* 10.5* 10.4* 10.5*  HCT 37.6* 36.9* 37.1* 32.0* 33.0* 31.9*  MCV 93.1 90.2 90.5 90.7 92.2 91.4  PLT 178 177 169 180 181 355   Basic Metabolic Panel: Recent Labs  Lab 11/11/21 0101 11/12/21 0144 11/13/21 0255 11/14/21 0357 11/15/21 0136  NA 139 139 139 139 140  K 4.6 4.8 4.1 4.6 4.4  CL 102 106 105 106 106  CO2 22 20* 23 21* 23  GLUCOSE 237* 236* 212* 189* 209*  BUN 82* 84* 81* 84* 84*  CREATININE 3.81* 3.48* 3.14* 2.87* 2.86*  CALCIUM 9.2 8.8* 8.5* 8.3* 8.3*   GFR: Estimated Creatinine Clearance: 25.4 mL/min (A) (by C-G formula based on SCr of 2.86 mg/dL (H)). Liver Function Tests: Recent Labs  Lab 11/10/21 1409  AST 11*  ALT 9  ALKPHOS 50  BILITOT 0.4  PROT 6.7  ALBUMIN 3.0*   No results for input(s): LIPASE, AMYLASE in the last 168 hours. No results for input(s): AMMONIA in the last 168 hours. Coagulation Profile: Recent Labs  Lab 11/10/21 1409  INR 1.0   Cardiac Enzymes: No results for input(s): CKTOTAL, CKMB, CKMBINDEX, TROPONINI in the last 168 hours. BNP (last 3 results) No results for input(s): PROBNP in the last 8760 hours. HbA1C: No results for input(s): HGBA1C in the last 72 hours. CBG: Recent Labs  Lab 11/14/21 0841 11/14/21 1139 11/14/21 1646 11/14/21 2122 11/15/21 0618  GLUCAP 176* 207* 172* 185* 186*   Lipid Profile: No results for input(s): CHOL, HDL, LDLCALC, TRIG, CHOLHDL, LDLDIRECT in the last 72 hours. Thyroid  Function Tests: No results for input(s): TSH, T4TOTAL, FREET4, T3FREE, THYROIDAB in the last 72 hours. Anemia Panel: No results for input(s): VITAMINB12, FOLATE, FERRITIN, TIBC, IRON, RETICCTPCT in the last 72 hours. Urine analysis:    Component Value Date/Time   COLORURINE YELLOW 11/10/2021 1639   APPEARANCEUR HAZY (A) 11/10/2021 1639   LABSPEC 1.012 11/10/2021 1639   PHURINE 5.0 11/10/2021 1639   GLUCOSEU NEGATIVE 11/10/2021 1639   HGBUR SMALL (A) 11/10/2021 1639   BILIRUBINUR NEGATIVE 11/10/2021 1639   KETONESUR NEGATIVE 11/10/2021 1639   PROTEINUR NEGATIVE 11/10/2021 1639   UROBILINOGEN 0.2 04/13/2014 2334   NITRITE NEGATIVE 11/10/2021 1639   LEUKOCYTESUR LARGE (A) 11/10/2021 1639   Recent Results (from the past 240 hour(s))  Resp Panel by RT-PCR (Flu A&B, Covid) Nasopharyngeal Swab     Status: None   Collection Time: 11/10/21  2:52 PM   Specimen: Nasopharyngeal Swab; Nasopharyngeal(NP) swabs  in vial transport medium  Result Value Ref Range Status   SARS Coronavirus 2 by RT PCR NEGATIVE NEGATIVE Final    Comment: (NOTE) SARS-CoV-2 target nucleic acids are NOT DETECTED.  The SARS-CoV-2 RNA is generally detectable in upper respiratory specimens during the acute phase of infection. The lowest concentration of SARS-CoV-2 viral copies this assay can detect is 138 copies/mL. A negative result does not preclude SARS-Cov-2 infection and should not be used as the sole basis for treatment or other patient management decisions. A negative result may occur with  improper specimen collection/handling, submission of specimen other than nasopharyngeal swab, presence of viral mutation(s) within the areas targeted by this assay, and inadequate number of viral copies(<138 copies/mL). A negative result must be combined with clinical observations, patient history, and epidemiological information. The expected result is Negative.  Fact Sheet for Patients:   EntrepreneurPulse.com.au  Fact Sheet for Healthcare Providers:  IncredibleEmployment.be  This test is no t yet approved or cleared by the [place] FDA and  has been authorized for detection and/or diagnosis of SARS-CoV-2 by FDA under an Emergency Use Authorization (EUA). This EUA will remain  in effect (meaning this test can be used) for the duration of the COVID-19 declaration under Section 564(b)(1) of the Act, 21 U.S.C.section 360bbb-3(b)(1), unless the authorization is terminated  or revoked sooner.       Influenza A by PCR NEGATIVE NEGATIVE Final   Influenza B by PCR NEGATIVE NEGATIVE Final    Comment: (NOTE) The Xpert Xpress SARS-CoV-2/FLU/RSV plus assay is intended as an aid in the diagnosis of influenza from Nasopharyngeal swab specimens and should not be used as a sole basis for treatment. Nasal washings and aspirates are unacceptable for Xpert Xpress SARS-CoV-2/FLU/RSV testing.  Fact Sheet for Patients: EntrepreneurPulse.com.au  Fact Sheet for Healthcare Providers: IncredibleEmployment.be  This test is not yet approved or cleared by the [place] FDA and has been authorized for detection and/or diagnosis of SARS-CoV-2 by FDA under an Emergency Use Authorization (EUA). This EUA will remain in effect (meaning this test can be used) for the duration of the COVID-19 declaration under Section 564(b)(1) of the Act, 21 U.S.C. section 360bbb-3(b)(1), unless the authorization is terminated or revoked.  Performed at [facility], [place] [address] [place], [place] [address]   Urine Culture     Status: Abnormal   Collection Time: 11/10/21  4:58 PM   Specimen: Urine, Clean Catch  Result Value Ref Range Status   Specimen Description URINE, CLEAN CATCH  Final   Special Requests   Final    NONE Performed at [facility], [place] [address], [place], [place] [address]    Culture  >=100,000 COLONIES/mL ESCHERICHIA COLI (A)  Final   Report Status 11/12/2021 FINAL  Final   Organism ID, Bacteria ESCHERICHIA COLI (A)  Final      Susceptibility   Escherichia coli - MIC*    AMPICILLIN >=32 RESISTANT Resistant     CEFAZOLIN >=64 RESISTANT Resistant     CEFEPIME <=0.12 SENSITIVE Sensitive     CEFTRIAXONE <=0.25 SENSITIVE Sensitive     CIPROFLOXACIN >=4 RESISTANT Resistant     GENTAMICIN 4 SENSITIVE Sensitive     IMIPENEM <=0.25 SENSITIVE Sensitive     NITROFURANTOIN <=16 SENSITIVE Sensitive     TRIMETH/SULFA <=20 SENSITIVE Sensitive     AMPICILLIN/SULBACTAM >=32 RESISTANT Resistant     PIP/TAZO >=128 RESISTANT Resistant     * >=100,000 COLONIES/mL ESCHERICHIA COLI     [place]  RT UPPER EXTREM LTD SOFT TISSUE NON VASCULAR  Result Date: 11/13/2021 CLINICAL DATA:  Pain and swelling.  History of AV fistula. EXAM: ULTRASOUND right UPPER EXTREMITY LIMITED TECHNIQUE: Ultrasound examination of the upper extremity soft tissues was performed in the area of clinical concern. COMPARISON:  None. FINDINGS: Mild inflammation/edema is noted in the soft tissues but no discrete fluid collection to suggest an abscess or hematoma. No soft tissue mass is identified. IMPRESSION: Mild soft tissue edema but no discrete fluid collection or hematoma. Electronically Signed   By: Marijo Sanes M.D.   On: 11/13/2021 20:04    Family Communication: Wife at bedside  Disposition: Status is: Inpatient Remains inpatient appropriate because: continued work up, consultations, anticipate SNF DC in 24-48 hours. Planned Discharge Destination: Skilled nursing facility   Patrecia Pour, MD 11/15/2021 10:29 AM Page by [email or web address]

## 2021-11-15 NOTE — Consult Note (Addendum)
Hospital Consult    Reason for Consult: Right arm swelling Requesting Physician: [facility] MRN #:  [id number]  History of Present Illness: This is a 81 y.o. male seen in consultation for evaluation of right arm edema.  He has undergone several dialysis access procedures in the past including failed fistulas on left and right upper arm.  Most recently he had a right radiocephalic fistula created at [facility] in [place] in December 2022.  He is not yet on hemodialysis.  He is currently hospitalized being worked up for CVA/TIA due to sudden right arm paralysis.  He was also noted to have outflow stenosis of left subclavian vein which likely led to the failure of left arm dialysis access.  Past Medical History:  Diagnosis Date   Acute deep vein thrombosis (DVT) of tibial vein of right lower extremity ([place]) 04/13/2014   Acute pulmonary embolism (HCC) 04/15/2014   Arthritis    Chronic fatigue    Chronic kidney disease, stage IV (severe) (HCC)    Complication of anesthesia    one surgery took 4 people to hold him down 2000   Constipation    Diabetic peripheral neuropathy ([place]) 10/26/2015   DM (diabetes mellitus), type 2, uncontrolled, with renal complications    [facility]   DVT (deep venous thrombosis) ([place])    RLE DVT with intermediate risk VQ scan for PE 03/2014   Dyspnea    Essential hypertension    Fibromyalgia    Hypothyroidism    Hypoxia 04/13/2014   Neuropathy    Obesity    OSA on CPAP    Pulmonary embolism ([place]) 04/13/2014   VQ 04/13/14 intermediate assoc with R DVT > on coumadin since Echo 04/14/14 > PA peak pressure: 89 mm Hg (S).     Past Surgical History:  Procedure Laterality Date   AV FISTULA PLACEMENT Left 04/18/2017   Procedure: ARTERIOVENOUS (AV) FISTULA CREATION;  Surgeon: Angelia Mould, MD;  Location: [facility];  Service: Vascular;  Laterality: Left;   [place] Right 11/14/2020   Procedure: BASCILIC VEIN TRANSPOSITION 1ST STAGE RIGHT;  Surgeon:  Angelia Mould, MD;  Location: [facility];  Service: Vascular;  Laterality: Right;   [place] Right 01/05/2021   Procedure: RIGHT SECOND STAGE [place];  Surgeon: Serafina Mitchell, MD;  Location: [facility] OR;  Service: Vascular;  Laterality: Right;   CERVICAL LAMINECTOMY     LIGATION OF ARTERIOVENOUS  FISTULA Left 06/01/2021   Procedure: LIGATION OF LEFT ARM FISTULA;  Surgeon: Serafina Mitchell, MD;  Location: [facility] OR;  Service: Vascular;  Laterality: Left;   LUMBAR FUSION     x 2   ROTATOR CUFF REPAIR Right    THYROIDECTOMY, PARTIAL      Allergies  Allergen Reactions   Statins Other (See Comments)    Elevated CK level   Lisinopril     Other reaction(s): stopped by pulmonology  to help with breathing   Latex Rash    Severe blistery rash    Prior to Admission medications   Medication Sig Start Date End Date Taking? Authorizing Provider  amLODipine (NORVASC) 5 MG tablet Take 5 mg by mouth daily.  03/18/15  Yes [provider]  atenolol (TENORMIN) 50 MG tablet Take 50 mg by mouth 2 (two) times daily.  01/31/14  Yes [provider]  Calcium-Vitamin D-Vitamin K ([facility] CALCIUM SOFT CHEWS) 650-12.5-40 MG-MCG-MCG CHEW Chew 1-2 tablets by mouth 2 (two) times daily. Take 1 tablet in the morning &  take 2 tablets by mouth at night   Yes [provider]  carboxymethylcellulose (REFRESH PLUS) 0.5 % SOLN Place 2 drops into both eyes 3 (three) times daily as needed (allergy/irritated eyes.).   Yes [provider]  clonazePAM (KLONOPIN) 1 MG tablet Take 1 mg by mouth at bedtime.  02/18/14  Yes [provider]  famotidine (PEPCID) 40 MG tablet Take 40 mg by mouth at bedtime.  03/17/14  Yes [provider]  furosemide (LASIX) 40 MG tablet Take 40-80 mg by mouth See admin instructions. Take 2 tablets (80 mg) by mouth in the morning & take 1 tablet (40 mg) by mouth in the afternoon. 10/20/14  Yes [provider]   HYDROcodone-acetaminophen (NORCO/VICODIN) 5-325 MG tablet Take 1-2 tablets by mouth See admin instructions. Take 1 tablet in the morning & take 2 tablets in the evening 06/27/18  Yes [provider]  Insulin Human (INSULIN PUMP) SOLN Inject into the skin. Humalog 100 unit/ml injection   Yes [provider]  ipratropium (ATROVENT) 0.03 % nasal spray Place 2 sprays into both nostrils daily as needed for rhinitis.   Yes [provider]  SYNTHROID 200 MCG tablet Take 200-300 mcg by mouth See admin instructions. Take 1 tablet (200 mcg) by mouth daily except on Sundays take 1.5 tablets (300 mcg) tablet by mouth 01/12/14  Yes [provider]  vitamin C (ASCORBIC ACID) 250 MG tablet Take 250-500 mg by mouth See admin instructions. Take 1 tablet (250 mg) by mouth in the morning and take 2 tablets (500 mg) by mouth at night   Yes [provider]  Accu-Chek Softclix Lancets lancets USE TO CHECK BLOOD SUGAR UP TO TID E11.29 12/21/19   [provider]  amoxicillin-clavulanate (AUGMENTIN) 250-125 MG tablet Take 1 tablet by mouth 2 (two) times daily. Patient not taking: Reported on 11/10/2021 06/27/21   [provider]  FREESTYLE TEST STRIPS test strip USE AS DIRECTED TO TEST BLOOD SUGAR 5 TIMES PER DAY. DX: E11.65 06/06/18   [provider]  HUMALOG 100 UNIT/ML injection Inject into the skin daily. Via insulin pump Patient not taking: Reported on 11/11/2021 03/21/21   [provider]  Lancets Misc. (ACCU-CHEK FASTCLIX LANCET) KIT USE TO CHECK BLOOD SUGAR UP TO TID E11.29 12/23/19   [provider]    Social History   Socioeconomic History   Marital status: Married    Spouse name: Not on file   Number of children: 1   Years of education: Not on file   Highest education level: Not on file  Occupational History   Not on file  Tobacco Use   Smoking status: Former    Types: Pipe   Smokeless tobacco: Never   Tobacco comments:     quit in 1993  Vaping Use   Vaping Use: Never used  Substance and Sexual Activity   Alcohol use: No   Drug use: No   Sexual activity: Not on file  Other Topics Concern   Not on file  Social History Narrative   Not on file   Social Determinants of Health   Financial Resource Strain: Not on file  Food Insecurity: Not on file  Transportation Needs: Not on file  Physical Activity: Not on file  Stress: Not on file  Social Connections: Not on file  Intimate Partner Violence: Not on file     Family History  Problem Relation Age of Onset   Cancer Father     ROS: Otherwise  negative unless mentioned in HPI  Physical Examination  Vitals:   11/15/21 0800 11/15/21 1155  BP: (!) 151/61 (!) 155/66  Pulse: 75 78  Resp: 16 16  Temp: 98.7 F (37.1 C) 100.3 F (37.9 C)  SpO2: 94% 95%   Body mass index is 39.64 kg/m.  General:  WDWN in NAD Gait: Not observed HENT: WNL, normocephalic Pulmonary: normal non-labored breathing, without Rales, rhonchi,  wheezing Cardiac: regular Abdomen:  soft, NT/ND, no masses Skin: without rashes Extremities: Palpable thrill through forearm fistula to the level of the AC fossa Musculoskeletal: no muscle wasting or atrophy  Neurologic: A&O X 3 Psychiatric:  The pt has Normal affect. Lymph:  Unremarkable  CBC    Component Value Date/Time   WBC 11.7 (H) 11/15/2021 0136   RBC 3.49 (L) 11/15/2021 0136   HGB 10.5 (L) 11/15/2021 0136   HCT 31.9 (L) 11/15/2021 0136   PLT 190 11/15/2021 0136   MCV 91.4 11/15/2021 0136   MCH 30.1 11/15/2021 0136   MCHC 32.9 11/15/2021 0136   RDW 14.0 11/15/2021 0136   LYMPHSABS 1.5 11/10/2021 1409   MONOABS 2.3 (H) 11/10/2021 1409   EOSABS 0.1 11/10/2021 1409   BASOSABS 0.1 11/10/2021 1409    BMET    Component Value Date/Time   NA 140 11/15/2021 0136   K 4.4 11/15/2021 0136   CL 106 11/15/2021 0136   CO2 23 11/15/2021 0136   GLUCOSE 209 (H) 11/15/2021 0136   BUN 84 (H) 11/15/2021 0136    CREATININE 2.86 (H) 11/15/2021 0136   CALCIUM 8.3 (L) 11/15/2021 0136   GFRNONAA 22 (L) 11/15/2021 0136   GFRAA 24 (L) 04/16/2014 0355    COAGS: Lab Results  Component Value Date   INR 1.0 11/10/2021   INR 1.26 04/16/2014   INR 1.20 04/15/2014     ASSESSMENT/PLAN: This is a 81 y.o. male with CKD admitted for TIA/CVA work-up due to sudden onset right arm paralysis/weakness  -Vascular asked to evaluate right arm edema.  On exam today I do not see a significant amount of edema in right arm compared to left.  He has an easily palpable thrill through radiocephalic fistula up to the level of the [facility] fossa which makes severe venous outflow stenosis unlikely.  Patient is not yet on hemodialysis thus would hold off on any venography studies.  We will start with a fistula duplex.  If edema of right arm worsens he can elevate his arm while in bed.  On-call vascular surgeon Dr. Trula Slade will evaluate the patient later today and provide further treatment plans.   Dagoberto Ligas PA-C [facility] [phone]   I agree with the above.  I have seen and evaluated the patient.  He was admitted for a stroke work-up due to right arm pain, numbness and weakness.  He also has a right arm radiocephalic fistula which has a good thrill within it.  His right hand is warm and appears to be well perfused.  This does not appear to be a steal syndrome causing his hand issues which are from the shoulder down.  He does have mild edema in the arm.  This could potentially be evaluated with a fistulogram, however he is not yet on dialysis, and so this could potentially result in worsening renal failure.  I would continue with his current work-up and delay fistulogram as I do not think this is contributing to his current symptoms.  I will continue to follow the patient while he is  in the hospital.  [name]

## 2021-11-15 NOTE — Consult Note (Signed)
Reason for Consult:Right thumb pain Referring Physician: Vance Gather Time called: 1214 Time at bedside: Daniel Ochoa is an 81 y.o. male.  HPI: Daniel Ochoa was admitted recently for UTI. He and his wife tell me that he has had right thumb pain since last week. Over the weekend, around the time of admission, it swelled. Then yesterday she noticed a slight discoloration of it and hand surgery was consulted. He is RHD, denies prior e/o, hx/o gout, fevers, chills, sweats, N/V. He has no pain at rest but only when he uses the thumb against resistance.  Past Medical History:  Diagnosis Date   Acute deep vein thrombosis (DVT) of tibial vein of right lower extremity ([place]) 04/13/2014   Acute pulmonary embolism (HCC) 04/15/2014   Arthritis    Chronic fatigue    Chronic kidney disease, stage IV (severe) (HCC)    Complication of anesthesia    one surgery took 4 people to hold him down 2000   Constipation    Diabetic peripheral neuropathy ([place]) 10/26/2015   DM (diabetes mellitus), type 2, uncontrolled, with renal complications    [facility]   DVT (deep venous thrombosis) ([place])    RLE DVT with intermediate risk VQ scan for PE 03/2014   Dyspnea    Essential hypertension    Fibromyalgia    Hypothyroidism    Hypoxia 04/13/2014   Neuropathy    Obesity    OSA on CPAP    Pulmonary embolism ([place]) 04/13/2014   VQ 04/13/14 intermediate assoc with R DVT > on coumadin since Echo 04/14/14 > PA peak pressure: 89 mm Hg (S).     Past Surgical History:  Procedure Laterality Date   AV FISTULA PLACEMENT Left 04/18/2017   Procedure: ARTERIOVENOUS (AV) FISTULA CREATION;  Surgeon: Angelia Mould, MD;  Location: [facility];  Service: Vascular;  Laterality: Left;   [place] Right 11/14/2020   Procedure: BASCILIC VEIN TRANSPOSITION 1ST STAGE RIGHT;  Surgeon: Angelia Mould, MD;  Location: [facility];  Service: Vascular;  Laterality: Right;   [place] Right 01/05/2021    Procedure: RIGHT SECOND STAGE [place];  Surgeon: Serafina Mitchell, MD;  Location[facility];  Service: Vascular;  Laterality: Right;   CERVICAL LAMINECTOMY     LIGATION OF ARTERIOVENOUS  FISTULA Left 06/01/2021   Procedure: LIGATION OF LEFT ARM FISTULA;  Surgeon: Serafina Mitchell, MD;  Location: [facility];  Service: Vascular;  Laterality: Left;   LUMBAR FUSION     x 2   ROTATOR CUFF REPAIR Right    THYROIDECTOMY, PARTIAL      Family History  Problem Relation Age of Onset   Cancer Father     Social History:  reports that he has quit smoking. His smoking use included pipe. He has never used smokeless tobacco. He reports that he does not drink alcohol and does not use drugs.  Allergies:  Allergies  Allergen Reactions   Statins Other (See Comments)    Elevated CK level   Lisinopril     Other reaction(s): stopped by pulmonology  to help with breathing   Latex Rash    Severe blistery rash    Medications: I have reviewed the patient's current medications.  Results for orders placed or performed during the hospital encounter of 11/10/21 (from the past 48 hour(s))  Glucose, capillary     Status: Abnormal   Collection Time: 11/13/21  4:15 PM  Result Value Ref Range   Glucose-Capillary 240 (  H) 70 - 99 mg/dL    Comment: Glucose reference range applies only to samples taken after fasting for at least 8 hours.  Glucose, capillary     Status: Abnormal   Collection Time: 11/13/21  9:31 PM  Result Value Ref Range   Glucose-Capillary 223 (H) 70 - 99 mg/dL    Comment: Glucose reference range applies only to samples taken after fasting for at least 8 hours.  Basic metabolic panel     Status: Abnormal   Collection Time: 11/14/21  3:57 AM  Result Value Ref Range   Sodium 139 135 - 145 mmol/L   Potassium 4.6 3.5 - 5.1 mmol/L   Chloride 106 98 - 111 mmol/L   CO2 21 (L) 22 - 32 mmol/L   Glucose, Bld 189 (H) 70 - 99 mg/dL    Comment: Glucose reference range applies only to samples  taken after fasting for at least 8 hours.   BUN 84 (H) 8 - 23 mg/dL   Creatinine, Ser 2.87 (H) 0.61 - 1.24 mg/dL   Calcium 8.3 (L) 8.9 - 10.3 mg/dL   GFR, Estimated 22 (L) >60 mL/min    Comment: (NOTE) Calculated using the CKD-EPI Creatinine Equation (2021)    Anion gap 12 5 - 15    Comment: Performed at [facility] [address] [place], [place] [address]  CBC     Status: Abnormal   Collection Time: 11/14/21  3:57 AM  Result Value Ref Range   WBC 13.6 (H) 4.0 - 10.5 K/uL   RBC 3.58 (L) 4.22 - 5.81 MIL/uL   Hemoglobin 10.4 (L) 13.0 - 17.0 g/dL   HCT 33.0 (L) 39.0 - 52.0 %   MCV 92.2 80.0 - 100.0 fL   MCH 29.1 26.0 - 34.0 pg   MCHC 31.5 30.0 - 36.0 g/dL   RDW 13.8 11.5 - 15.5 %   Platelets 181 150 - 400 K/uL   nRBC 0.0 0.0 - 0.2 %    Comment: Performed at [facility], [place] [address], [place], [place] [address]  Procalcitonin     Status: None   Collection Time: 11/14/21  3:57 AM  Result Value Ref Range   Procalcitonin 0.70 ng/mL    Comment:        Interpretation: PCT > 0.5 ng/mL and <= 2 ng/mL: Systemic infection (sepsis) is possible, but other conditions are known to elevate PCT as well. (NOTE)       Sepsis PCT Algorithm           Lower Respiratory Tract                                      Infection PCT Algorithm    ----------------------------     ----------------------------         PCT < 0.25 ng/mL                PCT < 0.10 ng/mL          Strongly encourage             Strongly discourage   discontinuation of antibiotics    initiation of antibiotics    ----------------------------     -----------------------------       PCT 0.25 - 0.50 ng/mL            PCT 0.10 - 0.25 ng/mL  OR       >80% decrease in PCT            Discourage initiation of                                            antibiotics      Encourage discontinuation           of antibiotics    ----------------------------     -----------------------------         PCT >= 0.50  ng/mL              PCT 0.26 - 0.50 ng/mL                AND       <80% decrease in PCT             Encourage initiation of                                             antibiotics       Encourage continuation           of antibiotics    ----------------------------     -----------------------------        PCT >= 0.50 ng/mL                  PCT > 0.50 ng/mL               AND         increase in PCT                  Strongly encourage                                      initiation of antibiotics    Strongly encourage escalation           of antibiotics                                     -----------------------------                                           PCT <= 0.25 ng/mL                                                 OR                                        > 80% decrease in PCT                                      Discontinue / Do not initiate  antibiotics  Performed at [facility], [place] [address] [place], [place] [address]   Glucose, capillary     Status: Abnormal   Collection Time: 11/14/21  6:19 AM  Result Value Ref Range   Glucose-Capillary 182 (H) 70 - 99 mg/dL    Comment: Glucose reference range applies only to samples taken after fasting for at least 8 hours.  Glucose, capillary     Status: Abnormal   Collection Time: 11/14/21  8:41 AM  Result Value Ref Range   Glucose-Capillary 176 (H) 70 - 99 mg/dL    Comment: Glucose reference range applies only to samples taken after fasting for at least 8 hours.  Glucose, capillary     Status: Abnormal   Collection Time: 11/14/21 11:39 AM  Result Value Ref Range   Glucose-Capillary 207 (H) 70 - 99 mg/dL    Comment: Glucose reference range applies only to samples taken after fasting for at least 8 hours.  Glucose, capillary     Status: Abnormal   Collection Time: 11/14/21  4:46 PM  Result Value Ref Range   Glucose-Capillary 172 (H) 70 - 99 mg/dL    Comment:  Glucose reference range applies only to samples taken after fasting for at least 8 hours.  Glucose, capillary     Status: Abnormal   Collection Time: 11/14/21  9:22 PM  Result Value Ref Range   Glucose-Capillary 185 (H) 70 - 99 mg/dL    Comment: Glucose reference range applies only to samples taken after fasting for at least 8 hours.  Basic metabolic panel     Status: Abnormal   Collection Time: 11/15/21  1:36 AM  Result Value Ref Range   Sodium 140 135 - 145 mmol/L   Potassium 4.4 3.5 - 5.1 mmol/L   Chloride 106 98 - 111 mmol/L   CO2 23 22 - 32 mmol/L   Glucose, Bld 209 (H) 70 - 99 mg/dL    Comment: Glucose reference range applies only to samples taken after fasting for at least 8 hours.   BUN 84 (H) 8 - 23 mg/dL   Creatinine, Ser 2.86 (H) 0.61 - 1.24 mg/dL   Calcium 8.3 (L) 8.9 - 10.3 mg/dL   GFR, Estimated 22 (L) >60 mL/min    Comment: (NOTE) Calculated using the CKD-EPI Creatinine Equation (2021)    Anion gap 11 5 - 15    Comment: Performed at [facility] [address] [place], [place] [address]  CBC     Status: Abnormal   Collection Time: 11/15/21  1:36 AM  Result Value Ref Range   WBC 11.7 (H) 4.0 - 10.5 K/uL   RBC 3.49 (L) 4.22 - 5.81 MIL/uL   Hemoglobin 10.5 (L) 13.0 - 17.0 g/dL   HCT 31.9 (L) 39.0 - 52.0 %   MCV 91.4 80.0 - 100.0 fL   MCH 30.1 26.0 - 34.0 pg   MCHC 32.9 30.0 - 36.0 g/dL   RDW 14.0 11.5 - 15.5 %   Platelets 190 150 - 400 K/uL   nRBC 0.0 0.0 - 0.2 %    Comment: Performed at [facility], [place] [address] [place], [place] [address]  Procalcitonin     Status: None   Collection Time: 11/15/21  1:36 AM  Result Value Ref Range   Procalcitonin 0.51 ng/mL    Comment:        Interpretation: PCT > 0.5 ng/mL and <= 2 ng/mL: Systemic infection (sepsis) is possible, but other conditions  are known to elevate PCT as well. CORRECTED RESULTS CALLED TO: Saul Fordyce, RN [phone] 11/15/21 A GASKINS (NOTE)       Sepsis PCT Algorithm           Lower  Respiratory Tract                                      Infection PCT Algorithm    ----------------------------     ----------------------------         PCT < 0.25 ng/mL                PCT < 0.10 ng/mL          Strongly encourage             Strongly discourage   discontinuation of antibiotics    initiation of antibiotics    ----------------------------     -----------------------------       PCT 0.25 - 0.50 ng/mL            PCT 0.10 - 0.25 ng/mL               OR       >80% decrease in PCT            Discourage initiation of                                            antibiotics      Encourage discontinuation           of antibiotics    ----------------------------     -----------------------------          PCT >= 0.50 ng/mL              PCT 0.26 - 0.50 ng/mL               AND       <80% decrease in PCT             Encourage initiation of                                             antibiotics       Encourage continuation           of antibiotics    ----------------------------     -----------------------------        PCT >= 0.50 ng/mL                  PCT > 0.50 ng/mL               AND         increase in PCT                  Strongly encourage                                      initiation of antibiotics    Strongly encourage escalation           of antibiotics                                     -----------------------------  PCT <= 0.25 ng/mL                                                 OR                                        > 80% decrease in PCT                                      Discontinue / Do not initiate                                             antibiotics  Performed at [facility], [place]  [address] [place], [place] [address] CORRECTED ON 03/01 AT 0540: PREVIOUSLY REPORTED AS <0.10        Interpretation: PCT (Procalcitonin) <= 0.5 ng/mL: Systemic infection (sepsis) is not likely. Local bacterial infection is  possible.   Glucose, capillary     Status: Abnormal   Collection Time: 11/15/21  6:18 AM  Result Value Ref Range   Glucose-Capillary 186 (H) 70 - 99 mg/dL    Comment: Glucose reference range applies only to samples taken after fasting for at least 8 hours.  Glucose, capillary     Status: Abnormal   Collection Time: 11/15/21 12:18 PM  Result Value Ref Range   Glucose-Capillary 241 (H) 70 - 99 mg/dL    Comment: Glucose reference range applies only to samples taken after fasting for at least 8 hours.    DG Finger Thumb Right  Result Date: 11/15/2021 CLINICAL DATA:  Distal thumb pain and swelling for 3 days. EXAM: RIGHT THUMB 2+V COMPARISON:  None. FINDINGS: Severe thumb carpometacarpal, moderate triscaphe joint, moderate to severe thumb interphalangeal, and mild thumb metacarpophalangeal joint space narrowing. Thumb carpometacarpal and thumb interphalangeal subchondral sclerosis and peripheral osteophytosis. No acute fracture is seen. No dislocation. IMPRESSION: No acute fracture. Osteoarthritis greatest within the thumb carpometacarpal and interphalangeal joints. Electronically Signed   By: Yvonne Kendall M.D.   On: 11/15/2021 11:55   [place] RT UPPER EXTREM LTD SOFT TISSUE NON VASCULAR  Result Date: 11/13/2021 CLINICAL DATA:  Pain and swelling.  History of AV fistula. EXAM: ULTRASOUND right UPPER EXTREMITY LIMITED TECHNIQUE: Ultrasound examination of the upper extremity soft tissues was performed in the area of clinical concern. COMPARISON:  None. FINDINGS: Mild inflammation/edema is noted in the soft tissues but no discrete fluid collection to suggest an abscess or hematoma. No soft tissue mass is identified. IMPRESSION: Mild soft tissue edema but no discrete fluid collection or hematoma. Electronically Signed   By: Marijo Sanes M.D.   On: 11/13/2021 20:04    Review of Systems  Constitutional:  Negative for chills, diaphoresis and fever.  HENT:  Negative for ear discharge, ear pain, hearing loss  and tinnitus.   Eyes:  Negative for photophobia and pain.  Respiratory:  Negative for cough and shortness of breath.   Cardiovascular:  Negative for chest pain.  Gastrointestinal:  Negative for abdominal pain, nausea and vomiting.  Genitourinary:  Negative for  dysuria, flank pain, frequency and urgency.  Musculoskeletal:  Positive for arthralgias (Right thumb). Negative for back pain, myalgias and neck pain.  Neurological:  Negative for dizziness and headaches.  Hematological:  Does not bruise/bleed easily.  Psychiatric/Behavioral:  The patient is not nervous/anxious.   Blood pressure (!) 155/66, pulse 78, temperature 100.3 F (37.9 C), temperature source Oral, resp. rate 16, height 5\' 7"  (1.702 m), weight 114.8 kg, SpO2 95 %. Physical Exam Constitutional:      General: He is not in acute distress.    Appearance: He is well-developed. He is not diaphoretic.  HENT:     Head: Normocephalic and atraumatic.  Eyes:     General: No scleral icterus.       Right eye: No discharge.        Left eye: No discharge.     Conjunctiva/sclera: Conjunctivae normal.  Cardiovascular:     Rate and Rhythm: Normal rate and regular rhythm.  Pulmonary:     Effort: Pulmonary effort is normal. No respiratory distress.  Musculoskeletal:     Cervical back: Normal range of motion.     Comments: Right shoulder, elbow, wrist, digits- no skin wounds, nontender, no instability, no blocks to motion, slight discoloration proximal to nail  Sens  Ax/R/M/U intact  Mot   Ax/ R/ PIN/ M/ AIN/ U intact  Rad 2+  Skin:    General: Skin is warm and dry.  Neurological:     Mental Status: He is alert.  Psychiatric:        Mood and Affect: Mood normal.        Behavior: Behavior normal.    Assessment/Plan: Right thumb pain -- Exam unconcerning for septic or crystal arthropathy. Given history would favor some mechanical issue. Will give thumb spica splint to see if that helps prn. Given recent placement of fistula in that  arm would have vascular evaluate to make sure there's no cause for concern from their end.    Lisette Abu, PA-C Orthopedic Surgery [phone] 11/15/2021, 12:52 PM

## 2021-11-16 ENCOUNTER — Inpatient Hospital Stay (HOSPITAL_COMMUNITY): Payer: Medicare Other

## 2021-11-16 DIAGNOSIS — R609 Edema, unspecified: Secondary | ICD-10-CM | POA: Diagnosis not present

## 2021-11-16 LAB — CBC WITH DIFFERENTIAL/PLATELET
Abs Immature Granulocytes: 0.31 10*3/uL — ABNORMAL HIGH (ref 0.00–0.07)
Basophils Absolute: 0 10*3/uL (ref 0.0–0.1)
Basophils Relative: 0 %
Eosinophils Absolute: 0.1 10*3/uL (ref 0.0–0.5)
Eosinophils Relative: 1 %
HCT: 32.8 % — ABNORMAL LOW (ref 39.0–52.0)
Hemoglobin: 10.7 g/dL — ABNORMAL LOW (ref 13.0–17.0)
Immature Granulocytes: 2 %
Lymphocytes Relative: 8 %
Lymphs Abs: 1 10*3/uL (ref 0.7–4.0)
MCH: 29.6 pg (ref 26.0–34.0)
MCHC: 32.6 g/dL (ref 30.0–36.0)
MCV: 90.9 fL (ref 80.0–100.0)
Monocytes Absolute: 1.6 10*3/uL — ABNORMAL HIGH (ref 0.1–1.0)
Monocytes Relative: 12 %
Neutro Abs: 9.8 10*3/uL — ABNORMAL HIGH (ref 1.7–7.7)
Neutrophils Relative %: 77 %
Platelets: 225 10*3/uL (ref 150–400)
RBC: 3.61 MIL/uL — ABNORMAL LOW (ref 4.22–5.81)
RDW: 13.9 % (ref 11.5–15.5)
WBC: 12.8 10*3/uL — ABNORMAL HIGH (ref 4.0–10.5)
nRBC: 0 % (ref 0.0–0.2)

## 2021-11-16 LAB — GLUCOSE, CAPILLARY
Glucose-Capillary: 204 mg/dL — ABNORMAL HIGH (ref 70–99)
Glucose-Capillary: 236 mg/dL — ABNORMAL HIGH (ref 70–99)
Glucose-Capillary: 184 mg/dL — ABNORMAL HIGH (ref 70–99)
Glucose-Capillary: 207 mg/dL — ABNORMAL HIGH (ref 70–99)

## 2021-11-16 NOTE — TOC Progression Note (Signed)
Transition of Care (TOC) - Progression Note  ? ? ?Patient Details  ?Name: Daniel Ochoa ?MRN: [id number] ?Date of Birth: August 07, 1941 ? ?Transition of Care (TOC) CM/SW Contact  ?Coralee Pesa, LCSWA ?Phone Number: ?11/16/2021, 12:36 PM ? ?Clinical Narrative:    ?CSW was notified that pt is not medically ready to DC today. CSW spoke with spouse who noted she would like to change to [facility] as it is closer to their house. CSW spoke with Miquel Dunn who confirmed a bed for tomorrow with no covid test needed. Plan is to DC tomorrow, TOC will continue to follow for DC needs. ? ? ?Expected Discharge Plan: [place] ?Barriers to Discharge: Continued Medical Work up ? ?Expected Discharge Plan and Services ?Expected Discharge Plan: [place] ?  ?  ?  ?Living arrangements for the past 2 months: [place] ?                ?  ?  ?  ?  ?  ?  ?  ?  ?  ?  ? ? ?Social Determinants of Health (SDOH) Interventions ?  ? ?Readmission Risk Interventions ?No flowsheet data found. ? ?

## 2021-11-16 NOTE — Progress Notes (Signed)
?  Progress Note ? ? ? ?11/16/2021 ?8:04 AM ?* No surgery found * ? ?Subjective:  no complaints ? ? ?Vitals:  ? 11/16/21 0347 11/16/21 0754  ?BP: (!) 150/75 (!) 155/63  ?Pulse: 80 81  ?Resp: 18 18  ?Temp: 100 ?F (37.8 ?C) 98 ?F (36.7 ?C)  ?SpO2: 95% 94%  ? ?Physical Exam: ?Cardiac:  regular ?Lungs:  non labored ?Extremities:  right upper extremity slightly edematous. Right hand is warm and well perfused. 2+ radial pulse. Good thrill in RC fistula ?Abdomen:  obese, soft ?Neurologic: alert ? ?CBC ?   ?Component Value Date/Time  ? WBC 12.8 (H) 11/16/2021 0106  ? RBC 3.61 (L) 11/16/2021 0106  ? HGB 10.7 (L) 11/16/2021 0106  ? HCT 32.8 (L) 11/16/2021 0106  ? PLT 225 11/16/2021 0106  ? MCV 90.9 11/16/2021 0106  ? MCH 29.6 11/16/2021 0106  ? MCHC 32.6 11/16/2021 0106  ? RDW 13.9 11/16/2021 0106  ? LYMPHSABS 1.0 11/16/2021 0106  ? MONOABS 1.6 (H) 11/16/2021 0106  ? EOSABS 0.1 11/16/2021 0106  ? BASOSABS 0.0 11/16/2021 0106  ? ? ?BMET ?   ?Component Value Date/Time  ? NA 140 11/15/2021 0136  ? K 4.4 11/15/2021 0136  ? CL 106 11/15/2021 0136  ? CO2 23 11/15/2021 0136  ? GLUCOSE 209 (H) 11/15/2021 0136  ? BUN 84 (H) 11/15/2021 0136  ? CREATININE 2.86 (H) 11/15/2021 0136  ? CALCIUM 8.3 (L) 11/15/2021 0136  ? GFRNONAA 22 (L) 11/15/2021 0136  ? GFRAA 24 (L) 04/16/2014 0355  ? ? ?INR ?   ?Component Value Date/Time  ? INR 1.0 11/10/2021 1409  ? ? ? ?Intake/Output Summary (Last 24 hours) at 11/16/2021 0804 ?Last data filed at 11/16/2021 0600 ?Gross per 24 hour  ?Intake 228.89 ml  ?Output 1650 ml  ?Net -1421.11 ml  ? ? ? ?Assessment/Plan:  81 y.o. male with right arm edema with right RC fistula. Fistula is functioning with good thrill. Right arm is warm and well perfused. There is mild edema present. No major change in swelling. Holding off on fistulogram at this time. Recommend Elevating RUE ? ? ?Karoline Caldwell, PA-C ?[facility] ?[phone] ?11/16/2021 ?8:04 AM ?

## 2021-11-16 NOTE — Progress Notes (Signed)
Physical Therapy Treatment ?Patient Details ?Name: Daniel Ochoa ?MRN: [id number] ?DOB: Sep 26, 1940 ?Today's Date: 11/16/2021 ? ? ?History of Present Illness 81 y/o male presented to ED on 11/10/21 for workup for TIA due to 2 days of slurred speech and weakness. Unable to obtain MRI due to kyphosis. CT head unremarkable. Patient with recent UTI x 2-3 weeks ago. Plan for R wrist ultrasound 2/27. PMH: DM type 2, Alzheimer's, Afib, CKD stage IV ? ?  ?PT Comments  ? ? Session limited to bed level due to frequent copious watery stools.  Rolled multiple times, completed basic AA/PROM to bil LE's/ L UE and gentle limited rom to R UE.  Overall limited though. ?   ?Recommendations for follow up therapy are one component of a multi-disciplinary discharge planning process, led by the attending physician.  Recommendations may be updated based on patient status, additional functional criteria and insurance authorization. ? ?Follow Up Recommendations ? Skilled nursing-short term rehab (<3 hours/day) ?  ?  ?Assistance Recommended at Discharge Frequent or constant Supervision/Assistance  ?Patient can return home with the following Two people to help with walking and/or transfers;Two people to help with bathing/dressing/bathroom;Assistance with cooking/housework;Assistance with feeding;Direct supervision/assist for medications management;Direct supervision/assist for financial management;Assist for transportation;Help with stairs or ramp for entrance ?  ?Equipment Recommendations ? None recommended by PT  ?  ?Recommendations for Other Services   ? ? ?  ?Precautions / Restrictions Precautions ?Precautions: Fall ?Precaution Comments: R wrist/forearm tender to touch, febrile ?Restrictions ?Weight Bearing Restrictions: No  ?  ? ?Mobility ? Bed Mobility ?Overal bed mobility: Needs Assistance ?Bed Mobility: Rolling ?Rolling: Max assist ?  ?  ?  ?  ?General bed mobility comments: rolling side to side during pericare.  Limited to peri care  today due ot copious watery stools. ?  ? ?Transfers ?  ?  ?  ?  ?  ?  ?  ?  ?  ?General transfer comment: unable to transfer due to copious watery stools ?  ? ?Ambulation/Gait ?  ?  ?  ?  ?  ?  ?  ?  ? ? ?Stairs ?  ?  ?  ?  ?  ? ? ?Wheelchair Mobility ?  ? ?Modified Rankin (Stroke Patients Only) ?Modified Rankin (Stroke Patients Only) ?Pre-Morbid Rankin Score: Moderately severe disability ?Modified Rankin: Severe disability ? ? ?  ?Balance   ?  ?Sitting balance-Leahy Scale: Fair ?  ?  ?  ?Standing balance-Leahy Scale: Poor ?  ?  ?  ?  ?  ?  ?  ?  ?  ?  ?  ?  ?  ? ?  ?Cognition Arousal/Alertness: Awake/alert ?Behavior During Therapy: Flat affect, WFL for tasks assessed/performed ?Overall Cognitive Status: History of cognitive impairments - at baseline ?  ?  ?  ?  ?  ?  ?  ?  ?  ?  ?  ?  ?  ?  ?  ?  ?General Comments: NT formally ?  ?  ? ?  ?Exercises Other Exercises ?Other Exercises: warm up aa/prom to bil LE's during peri care ? ?  ?General Comments   ?  ?  ? ?Pertinent Vitals/Pain Pain Assessment ?Pain Assessment: Faces ?Faces Pain Scale: Hurts whole lot ?Pain Descriptors / Indicators: Grimacing, Guarding ?Pain Intervention(s): Monitored during session  ? ? ?Home Living   ?  ?  ?  ?  ?  ?  ?  ?  ?  ?   ?  ?Prior  Function    ?  ?  ?   ? ?PT Goals (current goals can now be found in the care plan section) Acute Rehab PT Goals ?Patient Stated Goal: to feel better ?PT Goal Formulation: With family ?Time For Goal Achievement: Dec 21, 2021 ?Potential to Achieve Goals: Fair ?Progress towards PT goals: Not progressing toward goals - comment (unable to progress due to dopious watery stools) ? ?  ?Frequency ? ? ? Min 3X/week ? ? ? ?  ?PT Plan Current plan remains appropriate  ? ? ?Co-evaluation   ?  ?  ?  ?  ? ?  ?AM-PAC PT "6 Clicks" Mobility   ?Outcome Measure ? Help needed turning from your back to your side while in a flat bed without using bedrails?: Total ?Help needed moving from lying on your back to sitting on the side of  a flat bed without using bedrails?: Total ?Help needed moving to and from a bed to a chair (including a wheelchair)?: Total ?Help needed standing up from a chair using your arms (e.g., wheelchair or bedside chair)?: Total ?Help needed to walk in hospital room?: Total ?Help needed climbing 3-5 steps with a railing? : Total ?6 Click Score: 6 ? ?  ?End of Session   ?Activity Tolerance: Patient limited by pain;Other (comment) (copious stools) ?Patient left: in bed;with call bell/phone within reach;with family/visitor present ?Nurse Communication: Mobility status;Other (comment) (unable oto get OOB due to copious stools) ?PT Visit Diagnosis: Unsteadiness on feet (R26.81) ?  ? ? ?Time: 6226-3335 ?PT Time Calculation (min) (ACUTE ONLY): 26 min ? ?Charges:  $Therapeutic Activity: 23-37 mins          ?          ? ?11/16/2021 ? ?Ginger Carne., PT ?Acute Rehabilitation Services ?[phone]  (pager) ?[phone]  (office) ? ? ?Daniel Ochoa ?11/16/2021, 12:17 PM ? ?

## 2021-11-16 NOTE — Progress Notes (Signed)
Progress Note  Patient: Daniel Ochoa WUJ:[id number] DOB: July 22, 1941  DOA: 11/10/2021  DOS: 11/16/2021    Brief hospital course: Helmut Hennon is a 81 y.o. male with a history of IDT2DM on insulin pump, stage IV CKD s/p RUE AVF not yet on HD, among others who presented to the ED 2/24 for weakness, inability to walk, admitted for TIA work up with negative CT and inability to perform MRI due to kyphosis. He also had fevers and found to have E. coli UTI.  Assessment and Plan: E. coli UTI, recurrent, POA: Broadened abx to meropenem despite culture susceptibility to CTX. Fever curve, leukocytosis, PCT showing improvement.  - Continue meropenem, empiric 7 day course is planned (2/28 - 3/6), would place midline if stable for disposition to SNF prior to completing therapy.  - Trend fever curve which is responding but remains elevated. Recheck over next 24 hours. DC if stable.     TIA vs CVA, POA: Pt has persistent, acute right-sided deficits on exam, though the exam's specificity is affected by cognitive impairment. Initial CT head on 2/24 without acute findings, repeat CT 3/1 similarly nonacute. Note kyphosis precludes MRI.   Suspected dementia: Atrophy and chronic microvascular changes on CT.  - Delirium precautions.   Right thumb swelling and pain with IP swelling. No hx gout or known injury. Low suspicion for septic joint or crystal arthropathy. Splint as recommended impinges on prior fistula, removed by pt's wife. Will continue to monitor without directed Tx.     Mucocutaneous candida versus intertrigo continue topical fluconazole 3 times daily  IDT2DM:  - Does not know insulin pump settings, so giving SSI for now. Will recommend either this or reinitiation of pump at discharge.   Stage IV CKD: Followed by Dr. Moshe Cipro not yet on HD. No urgent indications.  - Avoid nephrotoxins, including NSAIDs - Restarted home lasix  RUE swelling: Perhaps due to hemiparesis-related  immobility, though patient had R brachiocephalic AVF placed Dec 9562 at [facility]. - Pt followed by [facility] previously, has history of failed R brachiobasilic AVF as well as L brachiocephalic AVF that failed due to subclavian stenosis and subsequent string venous hypertension.  - Vascular surgery consulted given relative acuity of swelling, history of swelling/subclavian stenosis with LUE fistula previously. U/S shows patency. Right forearm ultrasound negative for any overt fluid collections or abscess.  History of RLE DVT, PE s/p 3 months coumadin in 2015.  - Heparin VTE ppx  Hypothyroidism:  - Continue custom high dose synthroid. Last TSH wnl.  Obesity: Estimated body mass index is 39.64 kg/m as calculated from the following:   Height as of this encounter: 5\' 7"  (1.702 m).   Weight as of this encounter: 114.8 kg.   Subjective: Had some loose stools, so stool softeners have been dialed back. No abd pain. Eating ok. No new weakness or numbness. Wife says temp runs in 97'sF. Up to 100.3F this AM.   Objective: Vitals:   11/15/21 2332 11/16/21 0347 11/16/21 0754 11/16/21 1112  BP: (!) 153/87 (!) 150/75 (!) 155/63 (!) 153/61  Pulse: 81 80 81 83  Resp: 19 18 18 18   Temp: 98.6 F (37 C) 100 F (37.8 C) 98 F (36.7 C) 99.1 F (37.3 C)  TempSrc: Oral Oral  Oral  SpO2: 94% 95% 94% 95%  Weight:      Height:       Gen: 81 y.o. male in no distress Pulm: Nonlabored breathing room air. Clear. CV: Regular rate and rhythm.  No murmur, rub, or gallop. No JVD, no pitting dependent edema. GI: Abdomen soft, non-tender, non-distended, with hyperactive bowel sounds.  Ext: Warm, no deformities Skin: No new rashes, lesions or ulcers on visualized skin. +thrill RC AVF on right. No induration or tenderness. Diffuse mild edema RUE. Neuro: Alert and incompletely oriented, exam is limited but he does move all extremities to varying degrees. Moved right arm more in the course of conversation than he does  on command. Psych: Judgement and insight appear impaired. Mood euthymic & affect congruent. Behavior is appropriate.    Data Personally reviewed: CBC: Recent Labs  Lab 11/10/21 1409 11/11/21 0101 11/12/21 0144 11/13/21 0255 11/14/21 0357 11/15/21 0136 11/16/21 0106  WBC 10.8*   < > 14.9* 11.4* 13.6* 11.7* 12.8*  NEUTROABS 6.8  --   --   --   --   --  9.8*  HGB 11.8*   < > 11.9* 10.5* 10.4* 10.5* 10.7*  HCT 37.6*   < > 37.1* 32.0* 33.0* 31.9* 32.8*  MCV 93.1   < > 90.5 90.7 92.2 91.4 90.9  PLT 178   < > 169 180 181 190 225   < > = values in this interval not displayed.   Basic Metabolic Panel: Recent Labs  Lab 11/11/21 0101 11/12/21 0144 11/13/21 0255 11/14/21 0357 11/15/21 0136  NA 139 139 139 139 140  K 4.6 4.8 4.1 4.6 4.4  CL 102 106 105 106 106  CO2 22 20* 23 21* 23  GLUCOSE 237* 236* 212* 189* 209*  BUN 82* 84* 81* 84* 84*  CREATININE 3.81* 3.48* 3.14* 2.87* 2.86*  CALCIUM 9.2 8.8* 8.5* 8.3* 8.3*   GFR: Estimated Creatinine Clearance: 25.4 mL/min (A) (by C-G formula based on SCr of 2.86 mg/dL (H)). Liver Function Tests: Recent Labs  Lab 11/10/21 1409  AST 11*  ALT 9  ALKPHOS 50  BILITOT 0.4  PROT 6.7  ALBUMIN 3.0*   No results for input(s): LIPASE, AMYLASE in the last 168 hours. No results for input(s): AMMONIA in the last 168 hours. Coagulation Profile: Recent Labs  Lab 11/10/21 1409  INR 1.0   Cardiac Enzymes: No results for input(s): CKTOTAL, CKMB, CKMBINDEX, TROPONINI in the last 168 hours. BNP (last 3 results) No results for input(s): PROBNP in the last 8760 hours. HbA1C: No results for input(s): HGBA1C in the last 72 hours. CBG: Recent Labs  Lab 11/15/21 1218 11/15/21 1621 11/15/21 2118 11/16/21 0621 11/16/21 1151  GLUCAP 241* 212* 192* 204* 236*   Lipid Profile: No results for input(s): CHOL, HDL, LDLCALC, TRIG, CHOLHDL, LDLDIRECT in the last 72 hours. Thyroid Function Tests: No results for input(s): TSH, T4TOTAL, FREET4,  T3FREE, THYROIDAB in the last 72 hours. Anemia Panel: No results for input(s): VITAMINB12, FOLATE, FERRITIN, TIBC, IRON, RETICCTPCT in the last 72 hours. Urine analysis:    Component Value Date/Time   COLORURINE YELLOW 11/10/2021 1639   APPEARANCEUR HAZY (A) 11/10/2021 1639   LABSPEC 1.012 11/10/2021 1639   PHURINE 5.0 11/10/2021 1639   GLUCOSEU NEGATIVE 11/10/2021 1639   HGBUR SMALL (A) 11/10/2021 1639   BILIRUBINUR NEGATIVE 11/10/2021 1639   KETONESUR NEGATIVE 11/10/2021 1639   PROTEINUR NEGATIVE 11/10/2021 1639   UROBILINOGEN 0.2 04/13/2014 2334   NITRITE NEGATIVE 11/10/2021 1639   LEUKOCYTESUR LARGE (A) 11/10/2021 1639   Recent Results (from the past 240 hour(s))  Resp Panel by RT-PCR (Flu A&B, Covid) Nasopharyngeal Swab     Status: None   Collection Time: 11/10/21  2:52 PM  Specimen: Nasopharyngeal Swab; Nasopharyngeal(NP) swabs in vial transport medium  Result Value Ref Range Status   SARS Coronavirus 2 by RT PCR NEGATIVE NEGATIVE Final    Comment: (NOTE) SARS-CoV-2 target nucleic acids are NOT DETECTED.  The SARS-CoV-2 RNA is generally detectable in upper respiratory specimens during the acute phase of infection. The lowest concentration of SARS-CoV-2 viral copies this assay can detect is 138 copies/mL. A negative result does not preclude SARS-Cov-2 infection and should not be used as the sole basis for treatment or other patient management decisions. A negative result may occur with  improper specimen collection/handling, submission of specimen other than nasopharyngeal swab, presence of viral mutation(s) within the areas targeted by this assay, and inadequate number of viral copies(<138 copies/mL). A negative result must be combined with clinical observations, patient history, and epidemiological information. The expected result is Negative.  Fact Sheet for Patients:  EntrepreneurPulse.com.au  Fact Sheet for Healthcare Providers:   IncredibleEmployment.be  This test is no t yet approved or cleared by the [place] FDA and  has been authorized for detection and/or diagnosis of SARS-CoV-2 by FDA under an Emergency Use Authorization (EUA). This EUA will remain  in effect (meaning this test can be used) for the duration of the COVID-19 declaration under Section 564(b)(1) of the Act, 21 U.S.C.section 360bbb-3(b)(1), unless the authorization is terminated  or revoked sooner.       Influenza A by PCR NEGATIVE NEGATIVE Final   Influenza B by PCR NEGATIVE NEGATIVE Final    Comment: (NOTE) The Xpert Xpress SARS-CoV-2/FLU/RSV plus assay is intended as an aid in the diagnosis of influenza from Nasopharyngeal swab specimens and should not be used as a sole basis for treatment. Nasal washings and aspirates are unacceptable for Xpert Xpress SARS-CoV-2/FLU/RSV testing.  Fact Sheet for Patients: EntrepreneurPulse.com.au  Fact Sheet for Healthcare Providers: IncredibleEmployment.be  This test is not yet approved or cleared by the [place] FDA and has been authorized for detection and/or diagnosis of SARS-CoV-2 by FDA under an Emergency Use Authorization (EUA). This EUA will remain in effect (meaning this test can be used) for the duration of the COVID-19 declaration under Section 564(b)(1) of the Act, 21 U.S.C. section 360bbb-3(b)(1), unless the authorization is terminated or revoked.  Performed at [facility], [place] [address], [place], [place] [address]   Urine Culture     Status: Abnormal   Collection Time: 11/10/21  4:58 PM   Specimen: Urine, Clean Catch  Result Value Ref Range Status   Specimen Description URINE, CLEAN CATCH  Final   Special Requests   Final    NONE Performed at [facility], [place] [address], [place], [place] [address]    Culture >=100,000 COLONIES/mL ESCHERICHIA COLI (A)  Final   Report Status 11/12/2021 FINAL  Final    Organism ID, Bacteria ESCHERICHIA COLI (A)  Final      Susceptibility   Escherichia coli - MIC*    AMPICILLIN >=32 RESISTANT Resistant     CEFAZOLIN >=64 RESISTANT Resistant     CEFEPIME <=0.12 SENSITIVE Sensitive     CEFTRIAXONE <=0.25 SENSITIVE Sensitive     CIPROFLOXACIN >=4 RESISTANT Resistant     GENTAMICIN 4 SENSITIVE Sensitive     IMIPENEM <=0.25 SENSITIVE Sensitive     NITROFURANTOIN <=16 SENSITIVE Sensitive     TRIMETH/SULFA <=20 SENSITIVE Sensitive     AMPICILLIN/SULBACTAM >=32 RESISTANT Resistant     PIP/TAZO >=128 RESISTANT Resistant     * >=100,000 COLONIES/mL ESCHERICHIA COLI  CT HEAD WO CONTRAST (5MM)  Result Date: 11/15/2021 CLINICAL DATA:  Neuro deficit EXAM: CT HEAD WITHOUT CONTRAST TECHNIQUE: Contiguous axial images were obtained from the base of the skull through the vertex without intravenous contrast. RADIATION DOSE REDUCTION: This exam was performed according to the departmental dose-optimization program which includes automated exposure control, adjustment of the mA and/or kV according to patient size and/or use of iterative reconstruction technique. COMPARISON:  CT head 11/10/2021 FINDINGS: Limited study due to artifacts and motion. Brain: No acute intracranial hemorrhage, mass effect, or herniation. No extra-axial fluid collections. No evidence of acute territorial infarct. No hydrocephalus. Cortical volume loss and chronic microvascular ischemic changes. Vascular: Calcified plaques in the carotid siphons. Skull: No acute fracture. Hyperostosis frontalis interna. Previous suboccipital craniotomy. Sinuses/Orbits: No acute finding. Other: None. IMPRESSION: Chronic changes with no acute intracranial process identified. Electronically Signed   By: Ofilia Neas M.D.   On: 11/15/2021 14:12   DG Finger Thumb Right  Result Date: 11/15/2021 CLINICAL DATA:  Distal thumb pain and swelling for 3 days. EXAM: RIGHT THUMB 2+V COMPARISON:  None. FINDINGS: Severe thumb  carpometacarpal, moderate triscaphe joint, moderate to severe thumb interphalangeal, and mild thumb metacarpophalangeal joint space narrowing. Thumb carpometacarpal and thumb interphalangeal subchondral sclerosis and peripheral osteophytosis. No acute fracture is seen. No dislocation. IMPRESSION: No acute fracture. Osteoarthritis greatest within the thumb carpometacarpal and interphalangeal joints. Electronically Signed   By: Yvonne Kendall M.D.   On: 11/15/2021 11:55   VAS US DUPLEX DIALYSIS ACCESS (AVF, AVG)  Result Date: 11/16/2021 DIALYSIS ACCESS Patient Name:  [facility]  Date of Exam:   11/16/2021 Medical Rec #: [id number]            Accession #:    [id number] Date of Birth: [birth year]           Patient Gender: M Patient Age:   58 years Exam Location:  [facility] Procedure:      VAS US DUPLEX DIALYSIS ACCESS (AVF, AVG) Referring Phys: Dagoberto Ligas --------------------------------------------------------------------------------  Reason for Exam: Edema. Access Site: Right Upper Extremity. Access Type: Radial-cephalic AVF. Comparison Study: No prior study Performing Technologist: Maudry Mayhew MHA, RDMS, RVT, RDCS  Examination Guidelines: A complete evaluation includes B-mode imaging, spectral Doppler, color Doppler, and power Doppler as needed of all accessible portions of each vessel. Unilateral testing is considered an integral part of a complete examination. Limited examinations for reoccurring indications may be performed as noted.  Findings: +--------------------+----------+-----------------+--------+  AVF                  PSV (cm/s) Flow Vol (mL/min) Comments  +--------------------+----------+-----------------+--------+  Native artery inflow    433            371                  +--------------------+----------+-----------------+--------+  AVF Anastomosis         766                                 +--------------------+----------+-----------------+--------+   +------------+----------+-------------+----------+--------+  OUTFLOW VEIN PSV (cm/s) Diameter (cm) Depth (cm) Describe  +------------+----------+-------------+----------+--------+  Prox Forearm    122         0.74         1.30              +------------+----------+-------------+----------+--------+  Mid Forearm     227  0.62         0.39              +------------+----------+-------------+----------+--------+  Dist Forearm    530         0.60         0.22              +------------+----------+-------------+----------+--------+   Summary: Patent arteriovenous fistula. *See table(s) above for measurements and observations.      --------------------------------------------------------------------------------   Preliminary     Family Communication: Wife at bedside  Disposition: Status is: Inpatient Remains inpatient appropriate because: Anticipate SNF DC in 24 hours if fever curve continues improving. Planned Discharge Destination: Skilled nursing facility   Patrecia Pour, MD 11/16/2021 1:21 PM Page by [email or web address]

## 2021-11-16 NOTE — Progress Notes (Signed)
AVF duplex completed. ?Refer to "CV Proc" under chart review to view preliminary results. ? ?11/16/2021 11:46 AM ?Kelby Aline., MHA, RVT, RDCS, RDMS   ?

## 2021-11-17 ENCOUNTER — Other Ambulatory Visit (HOSPITAL_COMMUNITY): Payer: Self-pay

## 2021-11-17 DIAGNOSIS — M79601 Pain in right arm: Secondary | ICD-10-CM

## 2021-11-17 LAB — GLUCOSE, CAPILLARY
Glucose-Capillary: 206 mg/dL — ABNORMAL HIGH (ref 70–99)
Glucose-Capillary: 237 mg/dL — ABNORMAL HIGH (ref 70–99)
Glucose-Capillary: 203 mg/dL — ABNORMAL HIGH (ref 70–99)

## 2021-11-17 LAB — CBC
HCT: 34.9 % — ABNORMAL LOW (ref 39.0–52.0)
Hemoglobin: 11.6 g/dL — ABNORMAL LOW (ref 13.0–17.0)
MCH: 29.7 pg (ref 26.0–34.0)
MCHC: 33.2 g/dL (ref 30.0–36.0)
MCV: 89.5 fL (ref 80.0–100.0)
Platelets: 274 10*3/uL (ref 150–400)
RBC: 3.9 MIL/uL — ABNORMAL LOW (ref 4.22–5.81)
RDW: 13.7 % (ref 11.5–15.5)
WBC: 13.3 10*3/uL — ABNORMAL HIGH (ref 4.0–10.5)
nRBC: 0 % (ref 0.0–0.2)

## 2021-11-17 MED ORDER — CLONAZEPAM 1 MG PO TABS: 1.0000 mg | ORAL_TABLET | Freq: Every day | ORAL | 0 refills | Status: AC

## 2021-11-17 MED ORDER — INSULIN ASPART 100 UNIT/ML IJ SOLN: 0.0000 [IU] | Freq: Three times a day (TID) | INTRAMUSCULAR | Status: AC

## 2021-11-17 MED ORDER — ERTAPENEM IV (FOR PTA / DISCHARGE USE ONLY): 500.0000 mg | INTRAVENOUS | 0 refills | Status: AC

## 2021-11-17 NOTE — Progress Notes (Signed)
PHARMACY CONSULT NOTE FOR: ? ?OUTPATIENT  PARENTERAL ANTIBIOTIC THERAPY (OPAT) ? ?Indication: UTI ?Regimen: Ertapenem 500 mg iv Q 24 hours ?End date: 11/20/21 ? ?IV antibiotic discharge orders are pended. ?To discharging provider:  please sign these orders via discharge navigator,  ?Select New Orders & click on the button choice - Manage This Unsigned Work.  ?  ? ?Thank you for allowing pharmacy to be a part of this patient's care. ? ?Tad Moore ?11/17/2021, 1:05 PM ?

## 2021-11-17 NOTE — Progress Notes (Signed)
Multiple times call attempted to give report to SNF, no one answered. Left my contact number to call me. Patient will leaving with is PIV per MD's order to receive antibiotics. ?

## 2021-11-17 NOTE — Discharge Summary (Addendum)
Physician Discharge Summary   Patient: Daniel Ochoa MRN: [id number] DOB: [birth year]  Admit date:     11/10/2021  Discharge date: 11/17/21  Discharge Physician: Patrecia Pour   PCP: Donnajean Lopes, MD   Recommendations at discharge:  Suggest recheck BMP and CBC next week. Continue routine follow up with PCP, nephrology, and vascular surgery  Discharge Diagnoses: Principal Problem:   TIA (transient ischemic attack) Active Problems:   Diabetes mellitus type 2 with complications ([place])   Chronic kidney disease, stage IV (severe) (H[facility])   Alzheimer's disease ([place])   Hypothyroidism   Muscle weakness   Paroxysmal atrial fibrillation ([place])   Diabetic retinopathy associated with type 2 diabetes mellitus [facility])  Hospital Course: Daniel Ochoa is a 81 y.o. male with a history of IDT2DM on insulin pump, stage IV CKD s/p RUE AVF not yet on HD, among others who presented to the ED 2/24 for weakness, inability to walk, admitted for TIA work up with negative CT and inability to perform MRI due to kyphosis. He also had fevers and found to have E. coli UTI. The was treated with ceftriaxone and transitioned to meropenem after discussion with ID, due to persistence of fevers. Over several days, he has defervesced and is hemodynamically stable for discharge to complete a course of antibiotics at SNF. See below for further details.  Assessment and Plan: E. coli UTI, recurrent, POA: Broadened abx to meropenem despite culture susceptibility to CTX. Fever curve, leukocytosis, PCT showing improvement.  - Continue carbapenem per discussion with ID, empiric 7 day course is planned (2/28 - 3/6), will switch to ertapenem per pharmacy. Unable to place PICC due to AVF/CKD, midline also contraindicated per IV team. Pt has U/S-guided placement of peripheral IV which appears normal at discharge which will be used for administration for the several days after discharge.   Leukocytosis: Persistent though has  remained afebrile.  - Recheck next week. If persistent, consider hematology evaluation.    TIA: Pt has persistent, acute right-sided deficits on exam, though the exam's specificity is affected by cognitive impairment. Initial CT head on 2/24 without acute findings, repeat CT 3/1 similarly nonacute. Note kyphosis precludes MRI.    Suspected dementia: Atrophy and chronic microvascular changes on CT.  - Delirium precautions.    Right thumb osteoarthritis: Pain with IP swelling improved. No hx gout or known injury. Low suspicion for septic joint or crystal arthropathy. Orthopedics recommended supportive care.    Cutaneous candida versus intertrigo: Treated with topical antifungal.   IDT2DM:  - Does not know insulin pump settings, so giving SSI for now. Recommend reinitiation of insulin pump once able. The facility states they will use sliding scale insulin at discharge which I've ordered (0-9u novolog qAC) based on inpatient requirements. Suggest titration of insulin based on dietary intake/CBG trends at facility.   Stage IV CKD: Followed by Dr. Moshe Cipro not yet on HD. No urgent indications.  - Avoid nephrotoxins, including NSAIDs - Restarted home medications   RUE swelling: Perhaps due to immobility. U/S showed no abscess and patent fistula (had R brachiocephalic AVF placed Dec 16[facility]). - Pt followed by [facility] previously, has history of failed R brachiobasilic AVF as well as L brachiocephalic AVF that failed due to subclavian stenosis and subsequent string venous hypertension.  - Vascular surgery consulted given relative acuity of swelling, history of swelling/subclavian stenosis with LUE fistula previously. U/S shows patency. Right forearm ultrasound negative for any overt fluid collections or abscess. Routine follow  up recommended. - Elevate RUE as much as possible.   History of RLE DVT, PE s/p 3 months coumadin in 2015. No symptoms of DVT or PE at this time.    Hypothyroidism:  - Continue custom high dose synthroid. Last TSH wnl.   Obesity: Estimated body mass index is 39.64 kg/m as calculated from the following:   Height as of this encounter: '5\' 7"'  (1.702 m).   Weight as of this encounter: 114.8 kg.  Consultants: Neurology, orthopedics, vascular surgery, infectious disease Procedures performed: None  Disposition: Skilled nursing facility Diet recommendation:  Cardiac and Carb modified diet  DISCHARGE MEDICATION: Allergies as of 11/17/2021       Reactions   Statins Other (See Comments)   Elevated CK level   Lisinopril    Other reaction(s): stopped by pulmonology  to help with breathing   Latex Rash   Severe blistery rash        Medication List     STOP taking these medications    amoxicillin-clavulanate 250-125 MG tablet Commonly known as: AUGMENTIN   HumaLOG 100 UNIT/ML injection Generic drug: insulin lispro   HYDROcodone-acetaminophen 5-325 MG tablet Commonly known as: NORCO/VICODIN   insulin pump Soln       TAKE these medications    Accu-Chek FastClix Lancet Kit USE TO CHECK BLOOD SUGAR UP TO TID E11.29   Accu-Chek Softclix Lancets lancets USE TO CHECK BLOOD SUGAR UP TO TID E11.29   amLODipine 5 MG tablet Commonly known as: NORVASC Take 5 mg by mouth daily.   atenolol 50 MG tablet Commonly known as: TENORMIN Take 50 mg by mouth 2 (two) times daily.   carboxymethylcellulose 0.5 % Soln Commonly known as: REFRESH PLUS Place 2 drops into both eyes 3 (three) times daily as needed (allergy/irritated eyes.).   clonazePAM 1 MG tablet Commonly known as: KLONOPIN Take 1 tablet (1 mg total) by mouth at bedtime.   [employer] Calcium Soft Chews 650-12.5-40 MG-MCG-MCG Chew Generic drug: Calcium-Vitamin D-Vitamin K Chew 1-2 tablets by mouth 2 (two) times daily. Take 1 tablet in the morning & take 2 tablets by mouth at night   ertapenem  IVPB Commonly known as: INVANZ Inject 500 mg into the vein daily for 3 days.  Indication:  ESBL UTI First Dose: Yes Last Day of Therapy:  11/20/21 Labs - Once weekly:  CBC/D and BMP, Labs - Every other week:  ESR and CRP Method of administration: Mini-Bag Plus / Gravity Method of administration may be changed at the discretion of home infusion pharmacist based upon assessment of the patient and/or caregiver's ability to self-administer the medication ordered.   famotidine 40 MG tablet Commonly known as: PEPCID Take 40 mg by mouth at bedtime.   FREESTYLE TEST STRIPS test strip Generic drug: glucose blood USE AS DIRECTED TO TEST BLOOD SUGAR 5 TIMES PER DAY. DX: E11.65   furosemide 40 MG tablet Commonly known as: LASIX Take 40-80 mg by mouth See admin instructions. Take 2 tablets (80 mg) by mouth in the morning & take 1 tablet (40 mg) by mouth in the afternoon.   insulin aspart 100 UNIT/ML injection Commonly known as: novoLOG Inject 0-9 Units into the skin 3 (three) times daily with meals.   ipratropium 0.03 % nasal spray Commonly known as: ATROVENT Place 2 sprays into both nostrils daily as needed for rhinitis.   Synthroid 200 MCG tablet Generic drug: levothyroxine Take 200-300 mcg by mouth See admin instructions. Take 1 tablet (200 mcg) by mouth daily except on  'Sundays take 1.5 tablets (300 mcg) tablet by mouth   vitamin C 250 MG tablet Commonly known as: ASCORBIC ACID Take 250-500 mg by mouth See admin instructions. Take 1 tablet (250 mg) by mouth in the morning and take 2 tablets (500 mg) by mouth at night               Discharge Care Instructions  (From admission, onward)           Start     Ordered   11/17/21 0000  Change dressing on IV access line weekly and PRN  (Home infusion instructions - [facility] )        03' /03/23 1333            Follow-up Information     Donnajean Lopes, MD Follow up.   Specialty: Internal Medicine Contact information: [address] [place] [address] [phone]                 Subjective: Feels better, up in chair with PT worked better than previously. Wife at bedside and patient report he's ready to go to SNF. No fevers, chills, urinary complaints, leg swelling, dyspnea. Right arm swelling stable.   Discharge Exam: BP (!) 134/54 (BP Location: Left Arm)    Pulse 90    Temp 100.3 F (37.9 C) (Oral)    Resp 16    Ht '5\' 7"'  (1.702 m)    Wt 114.8 kg    SpO2 95%    BMI 39.64 kg/m   Gen: No distress Pulm: Clear, nonlabored Ext: RUE edema diffusely, nonpitting, +thrill in AVF. Right thumb mildly tender with bony hypertrophy, no spreading erythema or effusion Neuro: Alert, incompletely oriented but interactive. Moves all extremities.    Condition at discharge: stable  The results of significant diagnostics from this hospitalization (including imaging, microbiology, ancillary and laboratory) are listed below for reference.   Imaging Studies: DG Chest 2 View  Result Date: 11/10/2021 CLINICAL DATA:  Altered mental status EXAM: CHEST - 2 VIEW COMPARISON:  2015 FINDINGS: Shallow inspiration with low lung volumes. Patchy density at the left lung base. No significant pleural effusion. No pneumothorax. Cardiomediastinal contours are within normal limits. No acute osseous abnormality. Surgical clips overlie the right superior mediastinum or lower neck. IMPRESSION: Patchy atelectasis/consolidation at the left lung base. Electronically Signed   By: Macy Mis M.D.   On: 11/10/2021 13:50   DG Shoulder Right  Result Date: 11/10/2021 CLINICAL DATA:  Right elbow and shoulder pain. EXAM: RIGHT SHOULDER - 2+ VIEW COMPARISON:  No pertinent prior exams available for comparison. FINDINGS: There is normal bony alignment. No evidence of acute osseous or articular abnormality. Glenohumeral joint osteoarthrosis. Surgical clips within the right lower neck. IMPRESSION: No evidence of acute osseous or articular abnormality. Clinical humeral joint osteoarthrosis. Electronically Signed   By:  Kellie Simmering D.O.   On: 11/10/2021 13:50   DG Elbow Complete Right  Result Date: 11/10/2021 CLINICAL DATA:  RIGHT elbow and shoulder pain, altered mental status in a 81 year old male. EXAM: RIGHT ELBOW - COMPLETE 3+ VIEW COMPARISON:  None FINDINGS: No visible fracture. Degenerative changes about the elbow. Question mild elevation of the anterior fat pad but without distension/anterior bowing. No posterior fat pad. IMPRESSION: No visible fracture. Question mild elevation of the anterior fat pad but without anterior bowing of the fat pad. Favored to be related to degenerative changes, difficult to entirely exclude the possibility of occult fracture, most commonly of the radial head  in a skeletally mature patient. No posterior fat pad elevation, correlate with any point tenderness over the radial head. Electronically Signed   By: Zetta Bills M.D.   On: 11/10/2021 13:52   CT HEAD WO CONTRAST (5MM)  Result Date: 11/15/2021 CLINICAL DATA:  Neuro deficit EXAM: CT HEAD WITHOUT CONTRAST TECHNIQUE: Contiguous axial images were obtained from the base of the skull through the vertex without intravenous contrast. RADIATION DOSE REDUCTION: This exam was performed according to the departmental dose-optimization program which includes automated exposure control, adjustment of the mA and/or kV according to patient size and/or use of iterative reconstruction technique. COMPARISON:  CT head 11/10/2021 FINDINGS: Limited study due to artifacts and motion. Brain: No acute intracranial hemorrhage, mass effect, or herniation. No extra-axial fluid collections. No evidence of acute territorial infarct. No hydrocephalus. Cortical volume loss and chronic microvascular ischemic changes. Vascular: Calcified plaques in the carotid siphons. Skull: No acute fracture. Hyperostosis frontalis interna. Previous suboccipital craniotomy. Sinuses/Orbits: No acute finding. Other: None. IMPRESSION: Chronic changes with no acute intracranial  process identified. Electronically Signed   By: Ofilia Neas M.D.   On: 11/15/2021 14:12   CT HEAD WO CONTRAST (5MM)  Result Date: 11/10/2021 CLINICAL DATA:  Neuro deficit, acute, stroke suspected left leg weakness / R arm weakness / slurred speech for 2 days EXAM: CT HEAD WITHOUT CONTRAST TECHNIQUE: Contiguous axial images were obtained from the base of the skull through the vertex without intravenous contrast. RADIATION DOSE REDUCTION: This exam was performed according to the departmental dose-optimization program which includes automated exposure control, adjustment of the mA and/or kV according to patient size and/or use of iterative reconstruction technique. COMPARISON:  None. FINDINGS: Suboptimal evaluation due to artifact. Brain: There is no acute intracranial hemorrhage, mass effect, or edema. Gray-white differentiation is preserved. There is no extra-axial fluid collection. Ventricles and sulci are within normal limits in size and configuration. Patchy low-density in the supratentorial white matter is nonspecific but may reflect mild chronic microvascular ischemic changes. Age-indeterminate small vessel infarct at the right caudothalamic groove. Vascular: There is atherosclerotic calcification at the skull base. Skull: Prior suboccipital craniectomy. Sinuses/Orbits: No acute finding. Other: None. IMPRESSION: Degraded by artifact. No acute intracranial hemorrhage or mass effect. Age-indeterminate small vessel infarct at the right caudothalamic groove. Mild chronic microvascular ischemic changes. Electronically Signed   By: Macy Mis M.D.   On: 11/10/2021 13:21   DG Finger Thumb Right  Result Date: 11/15/2021 CLINICAL DATA:  Distal thumb pain and swelling for 3 days. EXAM: RIGHT THUMB 2+V COMPARISON:  None. FINDINGS: Severe thumb carpometacarpal, moderate triscaphe joint, moderate to severe thumb interphalangeal, and mild thumb metacarpophalangeal joint space narrowing. Thumb carpometacarpal  and thumb interphalangeal subchondral sclerosis and peripheral osteophytosis. No acute fracture is seen. No dislocation. IMPRESSION: No acute fracture. Osteoarthritis greatest within the thumb carpometacarpal and interphalangeal joints. Electronically Signed   By: Yvonne Kendall M.D.   On: 11/15/2021 11:55   VAS US DUPLEX DIALYSIS ACCESS (AVF, AVG)  Result Date: 11/16/2021 DIALYSIS ACCESS Patient Name:  [facility]  Date of Exam:   11/16/2021 Medical Rec #: [id number]            Accession #:    [id number] Date of Birth: 08/29/1941           Patient Gender: M Patient Age:   83 years Exam Location:  [facility] Procedure:      VAS US DUPLEX DIALYSIS ACCESS (AVF, AVG) Referring Phys: Dagoberto Ligas --------------------------------------------------------------------------------  Reason for  Exam: Edema. Access Site: Right Upper Extremity. Access Type: Radial-cephalic AVF. Comparison Study: No prior study Performing Technologist: Maudry Mayhew MHA, RDMS, RVT, RDCS  Examination Guidelines: A complete evaluation includes B-mode imaging, spectral Doppler, color Doppler, and power Doppler as needed of all accessible portions of each vessel. Unilateral testing is considered an integral part of a complete examination. Limited examinations for reoccurring indications may be performed as noted.  Findings: +--------------------+----------+-----------------+--------+  AVF                  PSV (cm/s) Flow Vol (mL/min) Comments  +--------------------+----------+-----------------+--------+  Native artery inflow    433            371                  +--------------------+----------+-----------------+--------+  AVF Anastomosis         766                                 +--------------------+----------+-----------------+--------+  +------------+----------+-------------+----------+--------+  OUTFLOW VEIN PSV (cm/s) Diameter (cm) Depth (cm) Describe  +------------+----------+-------------+----------+--------+  Prox  Forearm    122         0.74         1.30              +------------+----------+-------------+----------+--------+  Mid Forearm     227         0.62         0.39              +------------+----------+-------------+----------+--------+  Dist Forearm    530         0.60         0.22              +------------+----------+-------------+----------+--------+   Summary: Patent arteriovenous fistula. *See table(s) above for measurements and observations.  Diagnosing physician: Jamelle Haring Electronically signed by Jamelle Haring on 11/16/2021 at 3:47:23 PM.    --------------------------------------------------------------------------------   Final    [place] RT UPPER EXTREM LTD SOFT TISSUE NON VASCULAR  Result Date: 11/13/2021 CLINICAL DATA:  Pain and swelling.  History of AV fistula. EXAM: ULTRASOUND right UPPER EXTREMITY LIMITED TECHNIQUE: Ultrasound examination of the upper extremity soft tissues was performed in the area of clinical concern. COMPARISON:  None. FINDINGS: Mild inflammation/edema is noted in the soft tissues but no discrete fluid collection to suggest an abscess or hematoma. No soft tissue mass is identified. IMPRESSION: Mild soft tissue edema but no discrete fluid collection or hematoma. Electronically Signed   By: Marijo Sanes M.D.   On: 11/13/2021 20:04    Microbiology: Results for orders placed or performed during the hospital encounter of 11/10/21  Resp Panel by RT-PCR (Flu A&B, Covid) Nasopharyngeal Swab     Status: None   Collection Time: 11/10/21  2:52 PM   Specimen: Nasopharyngeal Swab; Nasopharyngeal(NP) swabs in vial transport medium  Result Value Ref Range Status   SARS Coronavirus 2 by RT PCR NEGATIVE NEGATIVE Final    Comment: (NOTE) SARS-CoV-2 target nucleic acids are NOT DETECTED.  The SARS-CoV-2 RNA is generally detectable in upper respiratory specimens during the acute phase of infection. The lowest concentration of SARS-CoV-2 viral copies this assay can detect is 138  copies/mL. A negative result does not preclude SARS-Cov-2 infection and should not be used as the sole basis for treatment or other patient management decisions. A negative result may occur with  improper  specimen collection/handling, submission of specimen other than nasopharyngeal swab, presence of viral mutation(s) within the areas targeted by this assay, and inadequate number of viral copies(<138 copies/mL). A negative result must be combined with clinical observations, patient history, and epidemiological information. The expected result is Negative.  Fact Sheet for Patients:  EntrepreneurPulse.com.au  Fact Sheet for Healthcare Providers:  IncredibleEmployment.be  This test is no t yet approved or cleared by the [place] FDA and  has been authorized for detection and/or diagnosis of SARS-CoV-2 by FDA under an Emergency Use Authorization (EUA). This EUA will remain  in effect (meaning this test can be used) for the duration of the COVID-19 declaration under Section 564(b)(1) of the Act, 21 U.S.C.section 360bbb-3(b)(1), unless the authorization is terminated  or revoked sooner.       Influenza A by PCR NEGATIVE NEGATIVE Final   Influenza B by PCR NEGATIVE NEGATIVE Final    Comment: (NOTE) The Xpert Xpress SARS-CoV-2/FLU/RSV plus assay is intended as an aid in the diagnosis of influenza from Nasopharyngeal swab specimens and should not be used as a sole basis for treatment. Nasal washings and aspirates are unacceptable for Xpert Xpress SARS-CoV-2/FLU/RSV testing.  Fact Sheet for Patients: EntrepreneurPulse.com.au  Fact Sheet for Healthcare Providers: IncredibleEmployment.be  This test is not yet approved or cleared by the [place] FDA and has been authorized for detection and/or diagnosis of SARS-CoV-2 by FDA under an Emergency Use Authorization (EUA). This EUA will remain in effect (meaning  this test can be used) for the duration of the COVID-19 declaration under Section 564(b)(1) of the Act, 21 U.S.C. section 360bbb-3(b)(1), unless the authorization is terminated or revoked.  Performed at [facility], [place] [address], [place], [place] [address]   Urine Culture     Status: Abnormal   Collection Time: 11/10/21  4:58 PM   Specimen: Urine, Clean Catch  Result Value Ref Range Status   Specimen Description URINE, CLEAN CATCH  Final   Special Requests   Final    NONE Performed at [facility], [place] [address], [place], [place] [address]    Culture >=100,000 COLONIES/mL ESCHERICHIA COLI (A)  Final   Report Status 11/12/2021 FINAL  Final   Organism ID, Bacteria ESCHERICHIA COLI (A)  Final      Susceptibility   Escherichia coli - MIC*    AMPICILLIN >=32 RESISTANT Resistant     CEFAZOLIN >=64 RESISTANT Resistant     CEFEPIME <=0.12 SENSITIVE Sensitive     CEFTRIAXONE <=0.25 SENSITIVE Sensitive     CIPROFLOXACIN >=4 RESISTANT Resistant     GENTAMICIN 4 SENSITIVE Sensitive     IMIPENEM <=0.25 SENSITIVE Sensitive     NITROFURANTOIN <=16 SENSITIVE Sensitive     TRIMETH/SULFA <=20 SENSITIVE Sensitive     AMPICILLIN/SULBACTAM >=32 RESISTANT Resistant     PIP/TAZO >=128 RESISTANT Resistant     * >=100,000 COLONIES/mL ESCHERICHIA COLI  Culture, blood (Routine X 2) w Reflex to ID Panel     Status: None (Preliminary result)   Collection Time: 11/13/21  8:36 AM   Specimen: BLOOD LEFT HAND  Result Value Ref Range Status   Specimen Description BLOOD LEFT HAND  Final   Special Requests   Final    BOTTLES DRAWN AEROBIC AND ANAEROBIC Blood Culture adequate volume   Culture   Final    NO GROWTH 4 DAYS Performed at [facility], [address], [place], [place] [address]    Report Status PENDING  Incomplete  Culture, blood (Routine X 2) w Reflex to ID Panel     Status: None (Preliminary result)   Collection Time: 11/13/21  8:45 AM   Specimen: BLOOD LEFT ARM  Result  Value Ref Range Status   Specimen Description BLOOD LEFT ARM  Final   Special Requests   Final    BOTTLES DRAWN AEROBIC AND ANAEROBIC Blood Culture adequate volume   Culture   Final    NO GROWTH 4 DAYS Performed at [facility], [address], [place], [place] [address]    Report Status PENDING  Incomplete    Labs: CBC: Recent Labs  Lab 11/10/21 1409 11/11/21 0101 11/13/21 0255 11/14/21 0357 11/15/21 0136 11/16/21 0106 11/17/21 0526  WBC 10.8*   < > 11.4* 13.6* 11.7* 12.8* 13.3*  NEUTROABS 6.8  --   --   --   --  9.8*  --   HGB 11.8*   < > 10.5* 10.4* 10.5* 10.7* 11.6*  HCT 37.6*   < > 32.0* 33.0* 31.9* 32.8* 34.9*  MCV 93.1   < > 90.7 92.2 91.4 90.9 89.5  PLT 178   < > 180 181 190 225 274   < > = values in this interval not displayed.   Basic Metabolic Panel: Recent Labs  Lab 11/11/21 0101 11/12/21 0144 11/13/21 0255 11/14/21 0357 11/15/21 0136  NA 139 139 139 139 140  K 4.6 4.8 4.1 4.6 4.4  CL 102 106 105 106 106  CO2 22 20* 23 21* 23  GLUCOSE 237* 236* 212* 189* 209*  BUN 82* 84* 81* 84* 84*  CREATININE 3.81* 3.48* 3.14* 2.87* 2.86*  CALCIUM 9.2 8.8* 8.5* 8.3* 8.3*   Liver Function Tests: Recent Labs  Lab 11/10/21 1409  AST 11*  ALT 9  ALKPHOS 50  BILITOT 0.4  PROT 6.7  ALBUMIN 3.0*   CBG: Recent Labs  Lab 11/16/21 1151 11/16/21 1612 11/16/21 2110 11/17/21 0635 11/17/21 1255  GLUCAP 236* 184* 207* 206* 237*    Discharge time spent: greater than 30 minutes.  Signed: Patrecia Pour, MD [facility] 11/17/2021

## 2021-11-17 NOTE — Progress Notes (Signed)
? ? ?Subjective  -  ? ?Continues to have pain throughout the right arm with bilateral arm swelling ? ? ?Physical Exam: ? ?Palpable thrill within the right radiocephalic fistula ?The right leg has minimal swelling, but is slightly worse than the left. ?He has excellent grip strength and good cap refill in the right hand.  It is very warm.  Does not have prominent collaterals on the chest ? ? ?Duplex was performed yesterday that did not show any abnormality within his fistula. ? ? ? ?Assessment/Plan:   ? ?Right arm pain: I discussed with the patient and his wife, that at this point I do not feel that his fistula is contributing to his arm symptoms.  Potentially it could be causing some of his swelling however the arm swelling is relatively minor.  The only way to fully evaluate this would be with a fistulogram, however giving him additional dye could worsen his renal issues.  His arm pain is really throughout the entire arm which could not be explained by his fistula.  He definitely does not have evidence of steal syndrome.  I would recommend arm elevation to help with his swelling.  He can follow-up in the office in a few weeks if he continues to have issues.  From my perspective he can go home.  He does not have any restrictions on the right arm. ? ?[name] ?11/17/2021 ?10:14 AM ?-- ? ?Vitals:  ? 11/17/21 0446 11/17/21 0723  ?BP: 130/65 (!) 146/60  ?Pulse: 82 73  ?Resp: 19 16  ?Temp: 98.3 ?F (36.8 ?C) 98.8 ?F (37.1 ?C)  ?SpO2: 94% 94%  ? ? ?Intake/Output Summary (Last 24 hours) at 11/17/2021 1014 ?Last data filed at 11/17/2021 0500 ?Gross per 24 hour  ?Intake --  ?Output 2300 ml  ?Net -2300 ml  ? ? ? ?Laboratory ?CBC ?   ?Component Value Date/Time  ? WBC 13.3 (H) 11/17/2021 0526  ? HGB 11.6 (L) 11/17/2021 0526  ? HCT 34.9 (L) 11/17/2021 0526  ? PLT 274 11/17/2021 0526  ? ? ?BMET ?   ?Component Value Date/Time  ? NA 140 11/15/2021 0136  ? K 4.4 11/15/2021 0136  ? CL 106 11/15/2021 0136  ? CO2 23 11/15/2021 0136  ?  GLUCOSE 209 (H) 11/15/2021 0136  ? BUN 84 (H) 11/15/2021 0136  ? CREATININE 2.86 (H) 11/15/2021 0136  ? CALCIUM 8.3 (L) 11/15/2021 0136  ? GFRNONAA 22 (L) 11/15/2021 0136  ? GFRAA 24 (L) 04/16/2014 0355  ? ? ?COAG ?Lab Results  ?Component Value Date  ? INR 1.0 11/10/2021  ? INR 1.26 04/16/2014  ? INR 1.20 04/15/2014  ? ?No results found for: PTT ? ?Antibiotics ?Anti-infectives (From admission, onward)  ? ? Start     Dose/Rate Route Frequency Ordered Stop  ? 11/14/21 1215  meropenem (MERREM) 1 g in sodium chloride 0.9 % 100 mL IVPB       ? 1 g ?200 mL/hr over 30 Minutes Intravenous Every 12 hours 11/14/21 1116 12/05/2021 0959  ? 11/11/21 0900  cefTRIAXone (ROCEPHIN) 1 g in sodium chloride 0.9 % 100 mL IVPB  Status:  Discontinued       ? 1 g ?200 mL/hr over 30 Minutes Intravenous Every 24 hours 11/11/21 0804 11/14/21 1109  ? 11/10/21 1730  cefTRIAXone (ROCEPHIN) 1 g in sodium chloride 0.9 % 100 mL IVPB  Status:  Discontinued       ? 1 g ?200 mL/hr over 30 Minutes Intravenous Every 24 hours 11/10/21 1729  11/11/21 0804  ? ?  ? ? ? ?V. Leia Alf, M.D., FACS ?[facility] ?Office: [phone] ?Pager:  [phone]  ?

## 2021-11-17 NOTE — TOC Transition Note (Signed)
Transition of Care (TOC) - CM/SW Discharge Note ? ? ?Patient Details  ?Name: Daniel Ochoa ?MRN: [id number] ?Date of Birth: 04-17-1941 ? ?Transition of Care (TOC) CM/SW Contact:  ?[place], LCSW ?Phone Number: ?11/17/2021, 2:54 PM ? ? ?Clinical Narrative:    ? ?Patient will DC to: [facility] ?Anticipated DC date: 11/17/21 ?Family notified: Kleinschmidt,Jean (Spouse)  ?[phone] (Mobile) ?Transport by: Corey Harold ? ? ?Per MD patient ready for DC to [facility]. RN, patient, patient's family, and facility notified of DC. Discharge Summary and FL2 sent to facility. RN to call report prior to discharge [phone] DC packet on chart. Ambulance transport requested for patient.  ? ?CSW will sign off for now as social work intervention is no longer needed. Please consult [place] again if new needs arise.  ? ?Final next level of care: [place] ?Barriers to Discharge: No Barriers Identified ? ? ?Patient Goals and CMS Choice ?Patient states their goals for this hospitalization and ongoing recovery are:: to improve with therapy ?CMS Medicare.gov Compare Post Acute Care list provided to:: Patient ?Choice offered to / list presented to : Patient, Spouse ? ?Discharge Placement ?  ?           ?Patient chooses bed at: [facility] ?Patient to be transferred to facility by: [facility] ?Name of family member notified: Bearett, Porcaro (Spouse)   [phone] [facility]) ?Patient and family notified of of transfer: 11/17/21 ? ?Discharge Plan and Services ?  ?  ?           ?  ?  ?  ?  ?  ?  ?  ?  ?  ?  ? ?Social Determinants of Health (SDOH) Interventions ?  ? ? ?Readmission Risk Interventions ?No flowsheet data found. ? ? ? ? ?

## 2021-11-17 NOTE — Progress Notes (Addendum)
Physical Therapy Treatment ?Patient Details ?Name: Daniel Ochoa ?MRN: [id number] ?DOB: November 25, 1940 ?Today's Date: 11/17/2021 ? ? ?History of Present Illness 81 y/o male presented to ED on 11/10/21 for workup for TIA due to 2 days of slurred speech and weakness. Unable to obtain MRI due to kyphosis. CT head unremarkable. Patient with recent UTI x 2-3 weeks ago. Plan for R wrist ultrasound 2/27. PMH: DM type 2, Alzheimer's, Afib, CKD stage IV ? ?  ?PT Comments  ? ? Pt progressing slowly toward goals, limited by painful knees and R UE as well as general biomechanical limitations.  Emphasis on rolling during peri care for loose stool, transitions/scooting to EOB, sit to stand into the RW and step pivot over to the recliner with significant 2 person assist and the RW. ?   ?Recommendations for follow up therapy are one component of a multi-disciplinary discharge planning process, led by the attending physician.  Recommendations may be updated based on patient status, additional functional criteria and insurance authorization. ? ?Follow Up Recommendations ? Skilled nursing-short term rehab (<3 hours/day) ?  ?  ?Assistance Recommended at Discharge Frequent or constant Supervision/Assistance  ?Patient can return home with the following Two people to help with walking and/or transfers;Two people to help with bathing/dressing/bathroom;Assistance with cooking/housework;Assistance with feeding;Direct supervision/assist for medications management;Direct supervision/assist for financial management;Assist for transportation;Help with stairs or ramp for entrance ?  ?Equipment Recommendations ? None recommended by PT  ?  ?Recommendations for Other Services   ? ? ?  ?Precautions / Restrictions Precautions ?Precautions: Fall ?Precaution Comments: R wrist/forearm tender to touch,  imaging negative, but are with significant swelling and pain with movement wrist, elbow and shoulder.  ?  ? ?Mobility ? Bed Mobility ?Overal bed mobility:  Needs Assistance ?Bed Mobility: Rolling, Sidelying to Sit ?Rolling: Max assist ?Sidelying to sit: Max assist, +2 for safety/equipment ?  ?  ?  ?General bed mobility comments: pt's joints are painful and stiff enough that pt does not have the ability to assist much.  Pt scoot to EOB asymetrically with min guard and extra time. ?  ? ?Transfers ?Overall transfer level: Needs assistance ?Equipment used: Rolling walker (2 wheels) ?Transfers: Sit to/from Stand, Bed to chair/wheelchair/BSC ?Sit to Stand: Mod assist, Max assist, +2 physical assistance ?  ?Step pivot transfers: Mod assist, +2 physical assistance ?  ?  ?  ?General transfer comment: cues for direction/technique.  Assist of 2 persons to help pt forward and then boost assist.  Once upright with L knee consistently flexed to ~20 deg in stance pt is able to accept more weight bil and pivot in RW with less overall assist. ?  ? ?Ambulation/Gait ?  ?  ?  ?  ?  ?  ?  ?  ? ? ?Stairs ?  ?  ?  ?  ?  ? ? ?Wheelchair Mobility ?  ? ?Modified Rankin (Stroke Patients Only) ?Modified Rankin (Stroke Patients Only) ?Pre-Morbid Rankin Score: Moderately severe disability ?Modified Rankin: Severe disability ? ? ?  ?Balance Overall balance assessment: Needs assistance ?Sitting-balance support: No upper extremity supported, Feet supported ?Sitting balance-Leahy Scale: Fair ?  ?  ?Standing balance support: Bilateral upper extremity supported ?Standing balance-Leahy Scale: Poor ?Standing balance comment: reliant on AD and external support. ?  ?  ?  ?  ?  ?  ?  ?  ?  ?  ?  ?  ? ?  ?Cognition Arousal/Alertness: Awake/alert ?Behavior During Therapy: [facility] for tasks assessed/performed, Flat affect ?Overall  Cognitive Status: History of cognitive impairments - at baseline ?  ?  ?  ?  ?  ?  ?  ?  ?  ?  ?  ?  ?  ?  ?  ?  ?General Comments: NT formally ?  ?  ? ?  ?Exercises Other Exercises ?Other Exercises: warm up aa/prom to bil LE's during peri care ?Other Exercises: Warm up UE exercise,P/  AAROM R Painful UE ? ?  ?General Comments   ?  ?  ? ?Pertinent Vitals/Pain Pain Assessment ?Pain Assessment: Faces ?Faces Pain Scale: Hurts even more (to 8) ?Pain Location: RUE and bil knees with ROM or mobility ?Pain Descriptors / Indicators: Grimacing, Guarding ?Pain Intervention(s): Monitored during session, Limited activity within patient's tolerance, Repositioned  ? ? ?Home Living   ?  ?  ?  ?  ?  ?  ?  ?  ?  ?   ?  ?Prior Function    ?  ?  ?   ? ?PT Goals (current goals can now be found in the care plan section) Acute Rehab PT Goals ?Patient Stated Goal: to feel better ?PT Goal Formulation: With family ?Time For Goal Achievement: 12/13/21 ?Potential to Achieve Goals: Fair ?Progress towards PT goals: Progressing toward goals ? ?  ?Frequency ? ? ? Min 3X/week ? ? ? ?  ?PT Plan Current plan remains appropriate  ? ? ?Co-evaluation PT/OT/SLP Co-Evaluation/Treatment: Yes ?Reason for Co-Treatment: For patient/therapist safety;Complexity of the patient's impairments (multi-system involvement) ?PT goals addressed during session: Mobility/safety with mobility ?  ?  ? ?  ?AM-PAC PT "6 Clicks" Mobility   ?Outcome Measure ? Help needed turning from your back to your side while in a flat bed without using bedrails?: Total ?Help needed moving from lying on your back to sitting on the side of a flat bed without using bedrails?: Total ?Help needed moving to and from a bed to a chair (including a wheelchair)?: Total ?Help needed standing up from a chair using your arms (e.g., wheelchair or bedside chair)?: Total ?Help needed to walk in hospital room?: Total ?Help needed climbing 3-5 steps with a railing? : Total ?6 Click Score: 6 ? ?  ?End of Session   ?Activity Tolerance: Patient limited by pain;Other (comment) (loose stools) ?Patient left: in chair;with call bell/phone within reach;with family/visitor present ?Nurse Communication: Mobility status ?PT Visit Diagnosis: Unsteadiness on feet (R26.81);Muscle weakness (generalized)  (M62.81);Other abnormalities of gait and mobility (R26.89) ?  ? ? ?Time: 1610-9604 ?PT Time Calculation (min) (ACUTE ONLY): 35 min ? ?Charges:  $Therapeutic Activity: 23-37 mins          ?          ? ?11/17/2021 ? ?Ginger Carne., PT ?Acute Rehabilitation Services ?[phone]  (pager) ?[phone]  (office) ? ? ?Daniel Ochoa ?11/17/2021, 1:26 PM ? ?

## 2021-11-17 NOTE — Progress Notes (Signed)
Report successfully given to [place] [facility] ?

## 2021-11-17 NOTE — Progress Notes (Signed)
Occupational Therapy Treatment ?Patient Details ?Name: Daniel Ochoa ?MRN: [id number] ?DOB: [birth year] ?Today's Date: 11/17/2021 ? ? ?History of present illness 81 y/o male presented to ED on 11/10/21 for workup for TIA due to 2 days of slurred speech and weakness. Unable to obtain MRI due to kyphosis. CT head unremarkable. Patient with recent UTI x 2-3 weeks ago. Plan for R wrist ultrasound 2/27. PMH: DM type 2, Alzheimer's, Afib, CKD stage IV ?  ?OT comments ? This 81 yo male seen today and was able to progress him from in bed>>edge of bed>sit to stand>stand turn from bed to recliner. Pt able to wash his face while seated in recliner with LUE (tried with RUE--but was unable to reach face). Worked on RUE exercises for shoulder, elbow, wrist, and hand (with encouragement to continue to do these throughout the day to help with edema). He will continue to benefit from acute OT with follow up at SNF.  ? ?Recommendations for follow up therapy are one component of a multi-disciplinary discharge planning process, led by the attending physician.  Recommendations may be updated based on patient status, additional functional criteria and insurance authorization. ?   ?Follow Up Recommendations ? Skilled nursing-short term rehab (<3 hours/day)  ?  ?Assistance Recommended at Discharge Frequent or constant Supervision/Assistance  ?Patient can return home with the following ? Two people to help with bathing/dressing/bathroom;Two people to help with walking and/or transfers;Assistance with feeding;Help with stairs or ramp for entrance;Assist for transportation;Assistance with cooking/housework;Direct supervision/assist for financial management;Direct supervision/assist for medications management ?  ?Equipment Recommendations ? Other (comment) (TBD next venue)  ?  ?   ?Precautions / Restrictions Precautions ?Precautions: Fall ?Precaution Comments: R wrist/forearm tender to touch,  imaging negative, but are with significant  swelling and pain with movement wrist, elbow and shoulder. Thumb spica splint had been order, but wife removed it after vascular said where the forearm piece rested was not good for his radial fistula. ?Restrictions ?Weight Bearing Restrictions: No  ? ? ?  ? ?Mobility Bed Mobility ?Overal bed mobility: Needs Assistance ?Bed Mobility: Rolling, Sidelying to Sit ?Rolling: Max assist, +2 for physical assistance ?Sidelying to sit: Max assist, +2 for safety/equipment ?  ?  ?  ?General bed mobility comments: pt's joints are painful and stiff enough that pt does not have the ability to assist much.  Pt scoot to EOB asymetrically with min guard and extra time. ?  ? ?Transfers ?Overall transfer level: Needs assistance ?Equipment used: Rolling walker (2 wheels) ?Transfers: Sit to/from Stand, Bed to chair/wheelchair/BSC ?Sit to Stand: Mod assist, Max assist, +2 physical assistance ?  ?  ?Step pivot transfers: Mod assist, +2 physical assistance ?  ?  ?General transfer comment: cues for direction/technique.  Assist of 2 persons to help pt forward and then boost assist.  Once upright with L knee consistently flexed to ~20 deg in stance pt is able to accept more weight bil and pivot in RW with less overall assist. ?  ?  ?   ? ?ADL either performed or assessed with clinical judgement  ? ?ADL Overall ADL's : Needs assistance/impaired ?  ?  ?Grooming: Wash/dry face;Sitting;Set up;Supervision/safety ?Grooming Details (indicate cue type and reason): with LUE, he did try with RUE, but due to edema and pain he cannot reach his face with his RUE ?  ?  ?  ?  ?  ?  ?  ?  ?Toilet Transfer: +2 for physical assistance;Stand-pivot;Rolling walker (2 wheels);Moderate assistance ?Armed forces technical officer Details (indicate  cue type and reason): simulated bed (raised ) to recliner on his right ?Toileting- Clothing Manipulation and Hygiene: Total assistance ?Toileting - Clothing Manipulation Details (indicate cue type and reason): Max A +2 sit<>stand from  raised be (gait belt and chuck pad, no AD) ?  ?  ?  ?  ?  ? ?Extremity/Trunk Assessment Upper Extremity Assessment ?RUE Deficits / Details: Pt can move his fingers, wrist, elbow about 50% of ROM with cues, but is limited by edema and pain for more ROM (Passive and active). He tolerates very little PROM at shoulder. ?  ?  ?  ?  ?  ? ?Vision Baseline Vision/History: 1 Wears glasses ?Ability to See in Adequate Light: 0 Adequate ?Patient Visual Report: No change from baseline ?  ?  ?   ?   ? ?Cognition Arousal/Alertness: Awake/alert ?Behavior During Therapy: [facility] for tasks assessed/performed, Flat affect ?Overall Cognitive Status: History of cognitive impairments - at baseline ?  ?  ?  ?  ?  ?  ?  ?  ?  ?  ?  ?  ?  ?  ?  ?  ?General Comments: slow to process and move ?  ?  ?   ?Exercises Other Exercises ?Other Exercises: Had pt do lap slides, tapping the pillow (wrist flexion/extension), "wiping" his hand on pillow side to side, and gave him a squeeze ball to work on while up in the recliner. ? ?  ?   ?   ? ? ?Pertinent Vitals/ Pain       Pain Assessment ?Pain Assessment: Faces ?Faces Pain Scale: Hurts even more (to 8) ?Pain Location: RUE and bil knees with ROM or mobility ?Pain Descriptors / Indicators: Grimacing, Guarding ?Pain Intervention(s): Limited activity within patient's tolerance, Monitored during session, Repositioned ? ?   ?   ? ?Frequency ? Min 2X/week  ? ? ? ? ?  ?Progress Toward Goals ? ?OT Goals(current goals can now be found in the care plan section) ? Progress towards OT goals: Progressing toward goals ? ?Acute Rehab OT Goals ?Patient Stated Goal: to go to rehab then go home from rehab ?OT Goal Formulation: With patient/family ?Time For Goal Achievement: 12-11-2021 ?Potential to Achieve Goals: Good  ?Plan     ? ?Co-evaluation ? ? ? PT/OT/SLP Co-Evaluation/Treatment: Yes ?Reason for Co-Treatment: For patient/therapist safety;To address functional/ADL transfers ?PT goals addressed during session:  Mobility/safety with mobility;Balance;Strengthening/ROM ?OT goals addressed during session: Strengthening/ROM;ADL's and self-care ?  ? ?  ?AM-PAC OT "6 Clicks" Daily Activity     ?Outcome Measure ? ? Help from another person eating meals?: A Little ?Help from another person taking care of personal grooming?: A Lot ?Help from another person toileting, which includes using toliet, bedpan, or urinal?: Total ?Help from another person bathing (including washing, rinsing, drying)?: A Lot ?Help from another person to put on and taking off regular upper body clothing?: Total ?Help from another person to put on and taking off regular lower body clothing?: Total ?6 Click Score: 10 ? ?  ?End of Session Equipment Utilized During Treatment: Gait belt ? ?OT Visit Diagnosis: Unsteadiness on feet (R26.81);Other abnormalities of gait and mobility (R26.89);Muscle weakness (generalized) (M62.81);Pain;Other symptoms and signs involving cognitive function ?Pain - Right/Left: Right ?Pain - part of body: Arm;Shoulder;Hand ?  ?Activity Tolerance Patient tolerated treatment well ?  ?Patient Left in chair;with call bell/phone within reach;with chair alarm set;with family/visitor present ?  ?Nurse Communication Need for lift equipment (maxi move in recliner with him) ?  ? ?   ? ?  Time: 1062-6948 ?OT Time Calculation (min): 35 min ? ?Charges: OT General Charges ?$OT Visit: 1 Visit ?OT Treatments ?$Self Care/Home Management : 8-22 mins ? ?[place] [address], OTR/L ?Acute Rehab Services ?Pager [phone] ?Office [phone] ? ? ? ?Almon Register ?11/17/2021, 3:35 PM ?

## 2021-11-17 NOTE — Progress Notes (Signed)
Secure chat with Dr. Bonner Puna regarding midline placement. Per midline protocol, this patient is not a candidate for a midline placement due to CKD stage IV and has AVF in both upper arms. Willing  to place another PIV with ultrasound for the infusion of antibiotics through 3/6. ?

## 2021-11-18 DIAGNOSIS — E440 Moderate protein-calorie malnutrition: Secondary | ICD-10-CM | POA: Diagnosis not present

## 2021-11-18 DIAGNOSIS — J9601 Acute respiratory failure with hypoxia: Secondary | ICD-10-CM | POA: Diagnosis not present

## 2021-11-18 DIAGNOSIS — R0603 Acute respiratory distress: Secondary | ICD-10-CM | POA: Diagnosis not present

## 2021-11-18 DIAGNOSIS — I248 Other forms of acute ischemic heart disease: Secondary | ICD-10-CM | POA: Diagnosis not present

## 2021-11-18 DIAGNOSIS — E1142 Type 2 diabetes mellitus with diabetic polyneuropathy: Secondary | ICD-10-CM | POA: Diagnosis not present

## 2021-11-18 DIAGNOSIS — R0689 Other abnormalities of breathing: Secondary | ICD-10-CM | POA: Diagnosis not present

## 2021-11-18 DIAGNOSIS — R278 Other lack of coordination: Secondary | ICD-10-CM | POA: Diagnosis not present

## 2021-11-18 DIAGNOSIS — Z20822 Contact with and (suspected) exposure to covid-19: Secondary | ICD-10-CM | POA: Diagnosis not present

## 2021-11-18 DIAGNOSIS — E1122 Type 2 diabetes mellitus with diabetic chronic kidney disease: Secondary | ICD-10-CM | POA: Diagnosis not present

## 2021-11-18 DIAGNOSIS — Z8673 Personal history of transient ischemic attack (TIA), and cerebral infarction without residual deficits: Secondary | ICD-10-CM | POA: Diagnosis not present

## 2021-11-18 DIAGNOSIS — I2699 Other pulmonary embolism without acute cor pulmonale: Secondary | ICD-10-CM | POA: Diagnosis not present

## 2021-11-18 DIAGNOSIS — N184 Chronic kidney disease, stage 4 (severe): Secondary | ICD-10-CM | POA: Diagnosis not present

## 2021-11-18 DIAGNOSIS — Z6831 Body mass index (BMI) 31.0-31.9, adult: Secondary | ICD-10-CM | POA: Diagnosis not present

## 2021-11-18 DIAGNOSIS — R0902 Hypoxemia: Secondary | ICD-10-CM | POA: Diagnosis not present

## 2021-11-18 DIAGNOSIS — M79604 Pain in right leg: Secondary | ICD-10-CM | POA: Diagnosis not present

## 2021-11-18 DIAGNOSIS — Y95 Nosocomial condition: Secondary | ICD-10-CM | POA: Diagnosis present

## 2021-11-18 DIAGNOSIS — M6281 Muscle weakness (generalized): Secondary | ICD-10-CM | POA: Diagnosis not present

## 2021-11-18 DIAGNOSIS — F0284 Dementia in other diseases classified elsewhere, unspecified severity, with anxiety: Secondary | ICD-10-CM | POA: Diagnosis not present

## 2021-11-18 DIAGNOSIS — E039 Hypothyroidism, unspecified: Secondary | ICD-10-CM | POA: Diagnosis not present

## 2021-11-18 DIAGNOSIS — N186 End stage renal disease: Secondary | ICD-10-CM | POA: Diagnosis not present

## 2021-11-18 DIAGNOSIS — M79605 Pain in left leg: Secondary | ICD-10-CM | POA: Diagnosis not present

## 2021-11-18 DIAGNOSIS — J984 Other disorders of lung: Secondary | ICD-10-CM | POA: Diagnosis not present

## 2021-11-18 DIAGNOSIS — R609 Edema, unspecified: Secondary | ICD-10-CM | POA: Diagnosis not present

## 2021-11-18 DIAGNOSIS — R739 Hyperglycemia, unspecified: Secondary | ICD-10-CM | POA: Diagnosis not present

## 2021-11-18 DIAGNOSIS — R1310 Dysphagia, unspecified: Secondary | ICD-10-CM | POA: Diagnosis not present

## 2021-11-18 DIAGNOSIS — F0280 Dementia in other diseases classified elsewhere without behavioral disturbance: Secondary | ICD-10-CM | POA: Diagnosis not present

## 2021-11-18 DIAGNOSIS — J189 Pneumonia, unspecified organism: Secondary | ICD-10-CM | POA: Diagnosis not present

## 2021-11-18 DIAGNOSIS — I130 Hypertensive heart and chronic kidney disease with heart failure and stage 1 through stage 4 chronic kidney disease, or unspecified chronic kidney disease: Secondary | ICD-10-CM | POA: Diagnosis not present

## 2021-11-18 DIAGNOSIS — A419 Sepsis, unspecified organism: Secondary | ICD-10-CM | POA: Diagnosis not present

## 2021-11-18 DIAGNOSIS — I959 Hypotension, unspecified: Secondary | ICD-10-CM | POA: Diagnosis not present

## 2021-11-18 DIAGNOSIS — I4891 Unspecified atrial fibrillation: Secondary | ICD-10-CM | POA: Diagnosis not present

## 2021-11-18 DIAGNOSIS — I480 Paroxysmal atrial fibrillation: Secondary | ICD-10-CM | POA: Diagnosis not present

## 2021-11-18 DIAGNOSIS — R918 Other nonspecific abnormal finding of lung field: Secondary | ICD-10-CM | POA: Diagnosis not present

## 2021-11-18 DIAGNOSIS — I82612 Acute embolism and thrombosis of superficial veins of left upper extremity: Secondary | ICD-10-CM | POA: Diagnosis not present

## 2021-11-18 DIAGNOSIS — R9431 Abnormal electrocardiogram [ECG] [EKG]: Secondary | ICD-10-CM | POA: Diagnosis not present

## 2021-11-18 DIAGNOSIS — R0602 Shortness of breath: Secondary | ICD-10-CM | POA: Diagnosis not present

## 2021-11-18 DIAGNOSIS — G309 Alzheimer's disease, unspecified: Secondary | ICD-10-CM | POA: Diagnosis not present

## 2021-11-18 DIAGNOSIS — R404 Transient alteration of awareness: Secondary | ICD-10-CM | POA: Diagnosis not present

## 2021-11-18 DIAGNOSIS — N390 Urinary tract infection, site not specified: Secondary | ICD-10-CM | POA: Diagnosis not present

## 2021-11-18 DIAGNOSIS — Z66 Do not resuscitate: Secondary | ICD-10-CM | POA: Diagnosis not present

## 2021-11-18 DIAGNOSIS — I82613 Acute embolism and thrombosis of superficial veins of upper extremity, bilateral: Secondary | ICD-10-CM | POA: Diagnosis not present

## 2021-11-18 DIAGNOSIS — E11319 Type 2 diabetes mellitus with unspecified diabetic retinopathy without macular edema: Secondary | ICD-10-CM | POA: Diagnosis not present

## 2021-11-18 DIAGNOSIS — Z7189 Other specified counseling: Secondary | ICD-10-CM | POA: Diagnosis not present

## 2021-11-18 DIAGNOSIS — Z7401 Bed confinement status: Secondary | ICD-10-CM | POA: Diagnosis not present

## 2021-11-18 DIAGNOSIS — G4733 Obstructive sleep apnea (adult) (pediatric): Secondary | ICD-10-CM | POA: Diagnosis not present

## 2021-11-18 DIAGNOSIS — Z515 Encounter for palliative care: Secondary | ICD-10-CM | POA: Diagnosis not present

## 2021-11-18 DIAGNOSIS — D631 Anemia in chronic kidney disease: Secondary | ICD-10-CM | POA: Diagnosis not present

## 2021-11-18 DIAGNOSIS — N185 Chronic kidney disease, stage 5: Secondary | ICD-10-CM | POA: Diagnosis not present

## 2021-11-18 DIAGNOSIS — G459 Transient cerebral ischemic attack, unspecified: Secondary | ICD-10-CM | POA: Diagnosis not present

## 2021-11-18 DIAGNOSIS — E669 Obesity, unspecified: Secondary | ICD-10-CM | POA: Diagnosis not present

## 2021-11-18 DIAGNOSIS — Z86718 Personal history of other venous thrombosis and embolism: Secondary | ICD-10-CM | POA: Diagnosis not present

## 2021-11-18 DIAGNOSIS — I10 Essential (primary) hypertension: Secondary | ICD-10-CM | POA: Diagnosis not present

## 2021-11-18 DIAGNOSIS — I5032 Chronic diastolic (congestive) heart failure: Secondary | ICD-10-CM | POA: Diagnosis not present

## 2021-11-18 LAB — CULTURE, BLOOD (ROUTINE X 2)
Culture: NO GROWTH
Culture: NO GROWTH
Special Requests: ADEQUATE
Special Requests: ADEQUATE

## 2021-11-18 NOTE — Progress Notes (Signed)
Patient picked up by [facility], wife updated. ?

## 2021-11-20 DIAGNOSIS — Z8673 Personal history of transient ischemic attack (TIA), and cerebral infarction without residual deficits: Secondary | ICD-10-CM | POA: Diagnosis not present

## 2021-11-20 DIAGNOSIS — E1122 Type 2 diabetes mellitus with diabetic chronic kidney disease: Secondary | ICD-10-CM | POA: Diagnosis not present

## 2021-11-20 DIAGNOSIS — I480 Paroxysmal atrial fibrillation: Secondary | ICD-10-CM | POA: Diagnosis not present

## 2021-11-20 DIAGNOSIS — I10 Essential (primary) hypertension: Secondary | ICD-10-CM | POA: Diagnosis not present

## 2021-11-20 DIAGNOSIS — N185 Chronic kidney disease, stage 5: Secondary | ICD-10-CM | POA: Diagnosis not present

## 2021-11-20 DIAGNOSIS — N390 Urinary tract infection, site not specified: Secondary | ICD-10-CM | POA: Diagnosis not present

## 2021-11-20 LAB — GLUCOSE, CAPILLARY: Glucose-Capillary: 209 mg/dL — ABNORMAL HIGH (ref 70–99)

## 2021-11-21 ENCOUNTER — Inpatient Hospital Stay (HOSPITAL_COMMUNITY)
Admission: EM | Admit: 2021-11-21 | Discharge: 2021-12-16 | DRG: 193 | Disposition: E | Payer: Medicare Other | Source: Skilled Nursing Facility | Attending: Internal Medicine | Admitting: Internal Medicine

## 2021-11-21 ENCOUNTER — Encounter (HOSPITAL_COMMUNITY): Payer: Self-pay | Admitting: Internal Medicine

## 2021-11-21 ENCOUNTER — Emergency Department (HOSPITAL_COMMUNITY): Payer: Medicare Other

## 2021-11-21 ENCOUNTER — Other Ambulatory Visit: Payer: Self-pay

## 2021-11-21 DIAGNOSIS — Z86718 Personal history of other venous thrombosis and embolism: Secondary | ICD-10-CM

## 2021-11-21 DIAGNOSIS — F0280 Dementia in other diseases classified elsewhere without behavioral disturbance: Secondary | ICD-10-CM | POA: Diagnosis present

## 2021-11-21 DIAGNOSIS — J9690 Respiratory failure, unspecified, unspecified whether with hypoxia or hypercapnia: Secondary | ICD-10-CM | POA: Diagnosis not present

## 2021-11-21 DIAGNOSIS — I248 Other forms of acute ischemic heart disease: Secondary | ICD-10-CM | POA: Diagnosis present

## 2021-11-21 DIAGNOSIS — J188 Other pneumonia, unspecified organism: Secondary | ICD-10-CM | POA: Diagnosis not present

## 2021-11-21 DIAGNOSIS — I130 Hypertensive heart and chronic kidney disease with heart failure and stage 1 through stage 4 chronic kidney disease, or unspecified chronic kidney disease: Secondary | ICD-10-CM | POA: Diagnosis present

## 2021-11-21 DIAGNOSIS — I82613 Acute embolism and thrombosis of superficial veins of upper extremity, bilateral: Secondary | ICD-10-CM | POA: Diagnosis present

## 2021-11-21 DIAGNOSIS — J9601 Acute respiratory failure with hypoxia: Secondary | ICD-10-CM | POA: Diagnosis present

## 2021-11-21 DIAGNOSIS — Z66 Do not resuscitate: Secondary | ICD-10-CM | POA: Diagnosis present

## 2021-11-21 DIAGNOSIS — N184 Chronic kidney disease, stage 4 (severe): Secondary | ICD-10-CM | POA: Diagnosis present

## 2021-11-21 DIAGNOSIS — E118 Type 2 diabetes mellitus with unspecified complications: Secondary | ICD-10-CM | POA: Diagnosis present

## 2021-11-21 DIAGNOSIS — J690 Pneumonitis due to inhalation of food and vomit: Secondary | ICD-10-CM | POA: Diagnosis present

## 2021-11-21 DIAGNOSIS — E1142 Type 2 diabetes mellitus with diabetic polyneuropathy: Secondary | ICD-10-CM | POA: Diagnosis present

## 2021-11-21 DIAGNOSIS — G309 Alzheimer's disease, unspecified: Secondary | ICD-10-CM | POA: Diagnosis present

## 2021-11-21 DIAGNOSIS — Z20822 Contact with and (suspected) exposure to covid-19: Secondary | ICD-10-CM | POA: Diagnosis present

## 2021-11-21 DIAGNOSIS — Z6831 Body mass index (BMI) 31.0-31.9, adult: Secondary | ICD-10-CM | POA: Diagnosis not present

## 2021-11-21 DIAGNOSIS — Z79899 Other long term (current) drug therapy: Secondary | ICD-10-CM

## 2021-11-21 DIAGNOSIS — R9431 Abnormal electrocardiogram [ECG] [EKG]: Secondary | ICD-10-CM | POA: Diagnosis not present

## 2021-11-21 DIAGNOSIS — I5032 Chronic diastolic (congestive) heart failure: Secondary | ICD-10-CM | POA: Diagnosis present

## 2021-11-21 DIAGNOSIS — R0902 Hypoxemia: Secondary | ICD-10-CM | POA: Diagnosis not present

## 2021-11-21 DIAGNOSIS — M79605 Pain in left leg: Secondary | ICD-10-CM | POA: Diagnosis not present

## 2021-11-21 DIAGNOSIS — I4891 Unspecified atrial fibrillation: Secondary | ICD-10-CM | POA: Diagnosis present

## 2021-11-21 DIAGNOSIS — I959 Hypotension, unspecified: Secondary | ICD-10-CM | POA: Diagnosis not present

## 2021-11-21 DIAGNOSIS — E440 Moderate protein-calorie malnutrition: Secondary | ICD-10-CM | POA: Diagnosis present

## 2021-11-21 DIAGNOSIS — R0602 Shortness of breath: Secondary | ICD-10-CM | POA: Diagnosis not present

## 2021-11-21 DIAGNOSIS — E669 Obesity, unspecified: Secondary | ICD-10-CM | POA: Diagnosis present

## 2021-11-21 DIAGNOSIS — Z981 Arthrodesis status: Secondary | ICD-10-CM

## 2021-11-21 DIAGNOSIS — Z91040 Latex allergy status: Secondary | ICD-10-CM

## 2021-11-21 DIAGNOSIS — Z87891 Personal history of nicotine dependence: Secondary | ICD-10-CM

## 2021-11-21 DIAGNOSIS — A419 Sepsis, unspecified organism: Secondary | ICD-10-CM | POA: Diagnosis not present

## 2021-11-21 DIAGNOSIS — D631 Anemia in chronic kidney disease: Secondary | ICD-10-CM | POA: Diagnosis present

## 2021-11-21 DIAGNOSIS — Z7189 Other specified counseling: Secondary | ICD-10-CM | POA: Diagnosis not present

## 2021-11-21 DIAGNOSIS — E1122 Type 2 diabetes mellitus with diabetic chronic kidney disease: Secondary | ICD-10-CM | POA: Diagnosis present

## 2021-11-21 DIAGNOSIS — I2699 Other pulmonary embolism without acute cor pulmonale: Secondary | ICD-10-CM | POA: Diagnosis present

## 2021-11-21 DIAGNOSIS — G4733 Obstructive sleep apnea (adult) (pediatric): Secondary | ICD-10-CM | POA: Diagnosis present

## 2021-11-21 DIAGNOSIS — E039 Hypothyroidism, unspecified: Secondary | ICD-10-CM | POA: Diagnosis present

## 2021-11-21 DIAGNOSIS — I639 Cerebral infarction, unspecified: Secondary | ICD-10-CM | POA: Diagnosis not present

## 2021-11-21 DIAGNOSIS — Z515 Encounter for palliative care: Secondary | ICD-10-CM

## 2021-11-21 DIAGNOSIS — J984 Other disorders of lung: Secondary | ICD-10-CM | POA: Diagnosis not present

## 2021-11-21 DIAGNOSIS — E66811 Obesity, class 1: Secondary | ICD-10-CM | POA: Diagnosis present

## 2021-11-21 DIAGNOSIS — Z86711 Personal history of pulmonary embolism: Secondary | ICD-10-CM

## 2021-11-21 DIAGNOSIS — I672 Cerebral atherosclerosis: Secondary | ICD-10-CM | POA: Diagnosis not present

## 2021-11-21 DIAGNOSIS — E1165 Type 2 diabetes mellitus with hyperglycemia: Secondary | ICD-10-CM | POA: Diagnosis present

## 2021-11-21 DIAGNOSIS — M797 Fibromyalgia: Secondary | ICD-10-CM | POA: Diagnosis present

## 2021-11-21 DIAGNOSIS — J189 Pneumonia, unspecified organism: Principal | ICD-10-CM | POA: Diagnosis present

## 2021-11-21 DIAGNOSIS — M79604 Pain in right leg: Secondary | ICD-10-CM | POA: Diagnosis not present

## 2021-11-21 DIAGNOSIS — R918 Other nonspecific abnormal finding of lung field: Secondary | ICD-10-CM | POA: Diagnosis not present

## 2021-11-21 DIAGNOSIS — Z79890 Hormone replacement therapy (postmenopausal): Secondary | ICD-10-CM

## 2021-11-21 DIAGNOSIS — R0689 Other abnormalities of breathing: Secondary | ICD-10-CM | POA: Diagnosis not present

## 2021-11-21 DIAGNOSIS — R269 Unspecified abnormalities of gait and mobility: Secondary | ICD-10-CM | POA: Diagnosis present

## 2021-11-21 DIAGNOSIS — Z888 Allergy status to other drugs, medicaments and biological substances status: Secondary | ICD-10-CM

## 2021-11-21 DIAGNOSIS — R1312 Dysphagia, oropharyngeal phase: Secondary | ICD-10-CM | POA: Diagnosis present

## 2021-11-21 DIAGNOSIS — R609 Edema, unspecified: Secondary | ICD-10-CM | POA: Diagnosis not present

## 2021-11-21 DIAGNOSIS — R54 Age-related physical debility: Secondary | ICD-10-CM | POA: Diagnosis present

## 2021-11-21 DIAGNOSIS — Z85828 Personal history of other malignant neoplasm of skin: Secondary | ICD-10-CM

## 2021-11-21 DIAGNOSIS — Y95 Nosocomial condition: Secondary | ICD-10-CM | POA: Diagnosis present

## 2021-11-21 DIAGNOSIS — F0284 Dementia in other diseases classified elsewhere, unspecified severity, with anxiety: Secondary | ICD-10-CM | POA: Diagnosis present

## 2021-11-21 DIAGNOSIS — Z794 Long term (current) use of insulin: Secondary | ICD-10-CM

## 2021-11-21 DIAGNOSIS — Z8673 Personal history of transient ischemic attack (TIA), and cerebral infarction without residual deficits: Secondary | ICD-10-CM

## 2021-11-21 DIAGNOSIS — R0603 Acute respiratory distress: Secondary | ICD-10-CM | POA: Diagnosis not present

## 2021-11-21 DIAGNOSIS — M40209 Unspecified kyphosis, site unspecified: Secondary | ICD-10-CM | POA: Diagnosis present

## 2021-11-21 DIAGNOSIS — R739 Hyperglycemia, unspecified: Secondary | ICD-10-CM | POA: Diagnosis not present

## 2021-11-21 DIAGNOSIS — R1310 Dysphagia, unspecified: Secondary | ICD-10-CM | POA: Diagnosis not present

## 2021-11-21 DIAGNOSIS — E11319 Type 2 diabetes mellitus with unspecified diabetic retinopathy without macular edema: Secondary | ICD-10-CM | POA: Diagnosis present

## 2021-11-21 DIAGNOSIS — I82612 Acute embolism and thrombosis of superficial veins of left upper extremity: Secondary | ICD-10-CM | POA: Diagnosis not present

## 2021-11-21 DIAGNOSIS — I10 Essential (primary) hypertension: Secondary | ICD-10-CM | POA: Diagnosis present

## 2021-11-21 LAB — CBC WITH DIFFERENTIAL/PLATELET
Abs Immature Granulocytes: 0.29 10*3/uL — ABNORMAL HIGH (ref 0.00–0.07)
Basophils Absolute: 0.1 10*3/uL (ref 0.0–0.1)
Basophils Relative: 0 %
Eosinophils Absolute: 0.6 10*3/uL — ABNORMAL HIGH (ref 0.0–0.5)
Eosinophils Relative: 3 %
HCT: 36 % — ABNORMAL LOW (ref 39.0–52.0)
Hemoglobin: 11.6 g/dL — ABNORMAL LOW (ref 13.0–17.0)
Immature Granulocytes: 2 %
Lymphocytes Relative: 5 %
Lymphs Abs: 0.9 10*3/uL (ref 0.7–4.0)
MCH: 29.6 pg (ref 26.0–34.0)
MCHC: 32.2 g/dL (ref 30.0–36.0)
MCV: 91.8 fL (ref 80.0–100.0)
Monocytes Absolute: 1.3 10*3/uL — ABNORMAL HIGH (ref 0.1–1.0)
Monocytes Relative: 7 %
Neutro Abs: 14.2 10*3/uL — ABNORMAL HIGH (ref 1.7–7.7)
Neutrophils Relative %: 83 %
Platelets: 416 10*3/uL — ABNORMAL HIGH (ref 150–400)
RBC: 3.92 MIL/uL — ABNORMAL LOW (ref 4.22–5.81)
RDW: 14 % (ref 11.5–15.5)
WBC: 17.3 10*3/uL — ABNORMAL HIGH (ref 4.0–10.5)
nRBC: 0 % (ref 0.0–0.2)

## 2021-11-21 LAB — COMPREHENSIVE METABOLIC PANEL
ALT: 16 U/L (ref 0–44)
AST: 16 U/L (ref 15–41)
Albumin: 2.3 g/dL — ABNORMAL LOW (ref 3.5–5.0)
Alkaline Phosphatase: 57 U/L (ref 38–126)
Anion gap: 8 (ref 5–15)
BUN: 82 mg/dL — ABNORMAL HIGH (ref 8–23)
CO2: 26 mmol/L (ref 22–32)
Calcium: 8.3 mg/dL — ABNORMAL LOW (ref 8.9–10.3)
Chloride: 103 mmol/L (ref 98–111)
Creatinine, Ser: 2.21 mg/dL — ABNORMAL HIGH (ref 0.61–1.24)
GFR, Estimated: 30 mL/min — ABNORMAL LOW (ref >60)
Glucose, Bld: 285 mg/dL — ABNORMAL HIGH (ref 70–99)
Potassium: 3.2 mmol/L — ABNORMAL LOW (ref 3.5–5.1)
Sodium: 137 mmol/L (ref 135–145)
Total Bilirubin: 0.2 mg/dL — ABNORMAL LOW (ref 0.3–1.2)
Total Protein: 6.2 g/dL — ABNORMAL LOW (ref 6.5–8.1)

## 2021-11-21 LAB — PROTIME-INR
INR: 1.1 (ref 0.8–1.2)
Prothrombin Time: 14.3 seconds (ref 11.4–15.2)

## 2021-11-21 LAB — TROPONIN I (HIGH SENSITIVITY)
Troponin I (High Sensitivity): 41 ng/L — ABNORMAL HIGH (ref <18)
Troponin I (High Sensitivity): 65 ng/L — ABNORMAL HIGH (ref <18)

## 2021-11-21 LAB — URINALYSIS, ROUTINE W REFLEX MICROSCOPIC
Bilirubin Urine: NEGATIVE
Glucose, UA: 50 mg/dL — AB
Hgb urine dipstick: NEGATIVE
Ketones, ur: NEGATIVE mg/dL
Leukocytes,Ua: NEGATIVE
Nitrite: NEGATIVE
Protein, ur: NEGATIVE mg/dL
Specific Gravity, Urine: 1.009 (ref 1.005–1.030)
pH: 5 (ref 5.0–8.0)

## 2021-11-21 LAB — RESP PANEL BY RT-PCR (FLU A&B, COVID) ARPGX2
Influenza A by PCR: NEGATIVE
Influenza B by PCR: NEGATIVE
SARS Coronavirus 2 by RT PCR: NEGATIVE

## 2021-11-21 LAB — APTT: aPTT: 27 seconds (ref 24–36)

## 2021-11-21 LAB — GLUCOSE, CAPILLARY
Glucose-Capillary: 251 mg/dL — ABNORMAL HIGH (ref 70–99)
Glucose-Capillary: 277 mg/dL — ABNORMAL HIGH (ref 70–99)

## 2021-11-21 LAB — BRAIN NATRIURETIC PEPTIDE: B Natriuretic Peptide: 195.4 pg/mL — ABNORMAL HIGH (ref 0.0–100.0)

## 2021-11-21 LAB — MRSA NEXT GEN BY PCR, NASAL: MRSA by PCR Next Gen: NOT DETECTED

## 2021-11-21 LAB — LACTIC ACID, PLASMA: Lactic Acid, Venous: 1.2 mmol/L (ref 0.5–1.9)

## 2021-11-21 LAB — TSH: TSH: 1.043 u[IU]/mL (ref 0.350–4.500)

## 2021-11-21 MED ORDER — ALBUTEROL SULFATE (2.5 MG/3ML) 0.083% IN NEBU: 2.5000 mg | INHALATION_SOLUTION | Freq: Three times a day (TID) | RESPIRATORY_TRACT | Status: DC

## 2021-11-21 MED ORDER — VANCOMYCIN HCL 10 G IV SOLR
2500.0000 mg | Freq: Once | INTRAVENOUS | Status: AC
  Administered 2021-11-21: 2500 mg via INTRAVENOUS
  Filled 2021-11-21: qty 50

## 2021-11-21 MED ORDER — VANCOMYCIN VARIABLE DOSE PER UNSTABLE RENAL FUNCTION (PHARMACIST DOSING): Status: DC

## 2021-11-21 MED ORDER — ALBUTEROL SULFATE (2.5 MG/3ML) 0.083% IN NEBU
2.5000 mg | INHALATION_SOLUTION | Freq: Three times a day (TID) | RESPIRATORY_TRACT | Status: DC
  Administered 2021-11-21 – 2021-11-25 (×12): 2.5 mg via RESPIRATORY_TRACT
  Filled 2021-11-21 (×12): qty 3

## 2021-11-21 MED ORDER — ONDANSETRON HCL 4 MG PO TABS: 4.0000 mg | ORAL_TABLET | Freq: Four times a day (QID) | ORAL | Status: DC | PRN

## 2021-11-21 MED ORDER — SODIUM CHLORIDE 0.9 % IV SOLN: INTRAVENOUS | Status: DC | PRN

## 2021-11-21 MED ORDER — SODIUM CHLORIDE 0.9 % IV SOLN
1.0000 g | Freq: Two times a day (BID) | INTRAVENOUS | Status: DC
  Administered 2021-11-22 – 2021-11-25 (×8): 1 g via INTRAVENOUS
  Filled 2021-11-21 (×2): qty 20
  Filled 2021-11-21: qty 1
  Filled 2021-11-21: qty 20
  Filled 2021-11-21: qty 1
  Filled 2021-11-21 (×2): qty 20
  Filled 2021-11-21: qty 1
  Filled 2021-11-21: qty 20

## 2021-11-21 MED ORDER — LEVOTHYROXINE SODIUM 100 MCG PO TABS: 300.0000 ug | ORAL_TABLET | ORAL | Status: DC

## 2021-11-21 MED ORDER — ASCORBIC ACID 500 MG PO TABS
500.0000 mg | ORAL_TABLET | Freq: Every day | ORAL | Status: DC
  Administered 2021-11-22 – 2021-11-24 (×3): 500 mg via ORAL
  Filled 2021-11-21 (×3): qty 1

## 2021-11-21 MED ORDER — FUROSEMIDE 10 MG/ML IJ SOLN
40.0000 mg | Freq: Once | INTRAMUSCULAR | Status: AC
  Administered 2021-11-21: 40 mg via INTRAVENOUS
  Filled 2021-11-21: qty 4

## 2021-11-21 MED ORDER — SODIUM CHLORIDE 0.9 % IV SOLN
1.0000 g | Freq: Once | INTRAVENOUS | Status: AC
  Administered 2021-11-21: 1 g via INTRAVENOUS
  Filled 2021-11-21: qty 20

## 2021-11-21 MED ORDER — FUROSEMIDE 10 MG/ML IJ SOLN
40.0000 mg | Freq: Two times a day (BID) | INTRAMUSCULAR | Status: DC
  Administered 2021-11-22: 40 mg via INTRAVENOUS
  Filled 2021-11-21: qty 4

## 2021-11-21 MED ORDER — ORAL CARE MOUTH RINSE
15.0000 mL | Freq: Two times a day (BID) | OROMUCOSAL | Status: DC
  Administered 2021-11-21 – 2021-11-23 (×6): 15 mL via OROMUCOSAL

## 2021-11-21 MED ORDER — ENOXAPARIN SODIUM 40 MG/0.4ML IJ SOSY
40.0000 mg | PREFILLED_SYRINGE | INTRAMUSCULAR | Status: DC
  Administered 2021-11-21: 40 mg via SUBCUTANEOUS
  Filled 2021-11-21: qty 0.4

## 2021-11-21 MED ORDER — IPRATROPIUM BROMIDE 0.03 % NA SOLN: 2.0000 | Freq: Every day | NASAL | Status: DC | PRN

## 2021-11-21 MED ORDER — POLYVINYL ALCOHOL 1.4 % OP SOLN: 2.0000 [drp] | Freq: Three times a day (TID) | OPHTHALMIC | Status: DC | PRN

## 2021-11-21 MED ORDER — FAMOTIDINE 20 MG PO TABS
40.0000 mg | ORAL_TABLET | Freq: Every day | ORAL | Status: DC
  Administered 2021-11-22: 40 mg via ORAL
  Filled 2021-11-21: qty 2

## 2021-11-21 MED ORDER — LEVOTHYROXINE SODIUM 100 MCG PO TABS: 200.0000 ug | ORAL_TABLET | ORAL | Status: DC

## 2021-11-21 MED ORDER — ACETAMINOPHEN 325 MG PO TABS
650.0000 mg | ORAL_TABLET | Freq: Four times a day (QID) | ORAL | Status: DC | PRN
  Administered 2021-11-23: 17:00:00 650 mg via ORAL
  Filled 2021-11-21: qty 2

## 2021-11-21 MED ORDER — LEVOTHYROXINE SODIUM 100 MCG PO TABS
200.0000 ug | ORAL_TABLET | ORAL | Status: DC
  Administered 2021-11-22 – 2021-11-25 (×3): 200 ug via ORAL
  Filled 2021-11-21 (×3): qty 2

## 2021-11-21 MED ORDER — SODIUM CHLORIDE 0.9 % IV SOLN: Freq: Once | INTRAVENOUS | Status: AC

## 2021-11-21 MED ORDER — POTASSIUM CHLORIDE CRYS ER 20 MEQ PO TBCR
20.0000 meq | EXTENDED_RELEASE_TABLET | Freq: Once | ORAL | Status: AC
  Administered 2021-11-21: 20 meq via ORAL
  Filled 2021-11-21: qty 1

## 2021-11-21 MED ORDER — ATENOLOL 25 MG PO TABS
50.0000 mg | ORAL_TABLET | Freq: Two times a day (BID) | ORAL | Status: DC
  Administered 2021-11-22: 50 mg via ORAL
  Filled 2021-11-21: qty 2

## 2021-11-21 MED ORDER — INSULIN ASPART 100 UNIT/ML IJ SOLN
3.0000 [IU] | Freq: Once | INTRAMUSCULAR | Status: AC
Start: 2021-11-22 — End: 2021-11-21
  Administered 2021-11-21: 3 [IU] via SUBCUTANEOUS

## 2021-11-21 MED ORDER — ACETAMINOPHEN 650 MG RE SUPP: 650.0000 mg | Freq: Four times a day (QID) | RECTAL | Status: DC | PRN

## 2021-11-21 MED ORDER — INSULIN ASPART 100 UNIT/ML IJ SOLN
0.0000 [IU] | Freq: Three times a day (TID) | INTRAMUSCULAR | Status: DC
  Administered 2021-11-22 (×2): 3 [IU] via SUBCUTANEOUS

## 2021-11-21 MED ORDER — AMLODIPINE BESYLATE 5 MG PO TABS
5.0000 mg | ORAL_TABLET | Freq: Every day | ORAL | Status: DC
Start: 2021-11-21 — End: 2021-11-22
  Administered 2021-11-22: 5 mg via ORAL
  Filled 2021-11-21: qty 1

## 2021-11-21 MED ORDER — ALBUMIN HUMAN 25 % IV SOLN
50.0000 g | Freq: Once | INTRAVENOUS | Status: AC
  Administered 2021-11-21: 50 g via INTRAVENOUS
  Filled 2021-11-21: qty 200

## 2021-11-21 MED ORDER — ONDANSETRON HCL 4 MG/2ML IJ SOLN: 4.0000 mg | Freq: Four times a day (QID) | INTRAMUSCULAR | Status: DC | PRN

## 2021-11-21 MED ORDER — ALBUTEROL SULFATE (2.5 MG/3ML) 0.083% IN NEBU: 2.5000 mg | INHALATION_SOLUTION | RESPIRATORY_TRACT | Status: DC | PRN

## 2021-11-21 MED ORDER — CLONAZEPAM 1 MG PO TABS
1.0000 mg | ORAL_TABLET | Freq: Every evening | ORAL | Status: DC | PRN
  Administered 2021-11-25: 1 mg via ORAL
  Filled 2021-11-21: qty 1

## 2021-11-21 MED ORDER — CHLORHEXIDINE GLUCONATE CLOTH 2 % EX PADS
6.0000 | MEDICATED_PAD | Freq: Every day | CUTANEOUS | Status: DC
  Administered 2021-11-21 – 2021-11-24 (×4): 6 via TOPICAL

## 2021-11-21 NOTE — ED Provider Notes (Addendum)
[facility] DEPT Provider Note   CSN: [id number] Arrival date & time: 12/06/2021  1014     History  CC: Shortness of breath, hypoxia   Daniel Ochoa is a 81 y.o. male presenting from [facility] with hypoxia.  The patient was discharged in the hospital 4 days ago on 11/17/21 after evaluation for TIA, E Coli UTI (treated with carbapenem 7 days, finished 11/20/21), presenting back to the Emergency Department concern for hypoxia.  Per his most recent hospital discharge, 4 days ago, when the patient was in the hospital he was noted to have concerns for stage IV kidney disease, he is not on dialysis yet, has LUE fistula with arm swelling that was found to be patent on an ultrasound performed at that time.  Is a history of diabetes and is on insulin.  He was also admitted for TIA work-up, was unable to have an MRI performed due to severe kyphosis of the spine, but had negative CT imaging at that time.  He was found to have persistent right-sided weakness and deficits in the ED  Blood cultures on 11/13/21 NGTD  Urine cx on 11/10/21: Specimen Description URINE, CLEAN CATCH   Special Requests NONE  Performed at [facility], [place] [address] [place], [place] [address]   Culture >=100,000 COLONIES/mL ESCHERICHIA COLI Abnormal    Report Status 11/12/2021 FINAL   Organism ID, Bacteria ESCHERICHIA COLI Abnormal    Resulting Agency [facility]     Susceptibility   Escherichia coli    MIC    AMPICILLIN >=32 RESIST... Resistant    AMPICILLIN/SULBACTAM >=32 RESIST... Resistant    CEFAZOLIN >=64 RESIST... Resistant    CEFEPIME <=0.12 SENS... Sensitive    CEFTRIAXONE <=0.25 SENS... Sensitive    CIPROFLOXACIN >=4 RESISTANT  Resistant    GENTAMICIN 4 SENSITIVE  Sensitive    IMIPENEM <=0.25 SENS... Sensitive    NITROFURANTOIN <=16 SENSIT... Sensitive    PIP/TAZO >=128 RESIS... Resistant    TRIMETH/SULFA <=20 SENSIT... Sensitive           HPI     Home  Medications Prior to Admission medications   Medication Sig Start Date End Date Taking? Authorizing Provider  Accu-Chek Softclix Lancets lancets USE TO CHECK BLOOD SUGAR UP TO TID E11.29 12/21/19   [provider]  amLODipine (NORVASC) 5 MG tablet Take 5 mg by mouth daily.  03/18/15   [provider]  atenolol (TENORMIN) 50 MG tablet Take 50 mg by mouth 2 (two) times daily.  01/31/14   [provider]  Calcium-Vitamin D-Vitamin K ([facility] CALCIUM SOFT CHEWS) 650-12.5-40 MG-MCG-MCG CHEW Chew 1-2 tablets by mouth 2 (two) times daily. Take 1 tablet in the morning & take 2 tablets by mouth at night    [provider]  carboxymethylcellulose (REFRESH PLUS) 0.5 % SOLN Place 2 drops into both eyes 3 (three) times daily as needed (allergy/irritated eyes.).    [provider]  clonazePAM (KLONOPIN) 1 MG tablet Take 1 tablet (1 mg total) by mouth at bedtime. 11/17/21   Patrecia Pour, MD  famotidine (PEPCID) 40 MG tablet Take 40 mg by mouth at bedtime.  03/17/14   [provider]  FREESTYLE TEST STRIPS test strip USE AS DIRECTED TO TEST BLOOD SUGAR 5 TIMES PER DAY. DX: E11.65 06/06/18   [provider]  furosemide (LASIX) 40 MG tablet Take 40-80 mg by mouth See admin instructions. Take 2 tablets (80 mg) by mouth in the morning &  take 1 tablet (40 mg) by mouth in the afternoon. 10/20/14   [provider]  insulin aspart (NOVOLOG) 100 UNIT/ML injection Inject 0-9 Units into the skin 3 (three) times daily with meals. 11/17/21   Patrecia Pour, MD  ipratropium (ATROVENT) 0.03 % nasal spray Place 2 sprays into both nostrils daily as needed for rhinitis.    [provider]  Lancets Misc. (ACCU-CHEK FASTCLIX LANCET) KIT USE TO CHECK BLOOD SUGAR UP TO TID E11.29 12/23/19   [provider]  SYNTHROID 200 MCG tablet Take 200-300 mcg by mouth See admin instructions. Take 1 tablet (200 mcg) by mouth daily except on Sundays take 1.5 tablets (300 mcg) tablet  by mouth 01/12/14   [provider]  vitamin C (ASCORBIC ACID) 250 MG tablet Take 250-500 mg by mouth See admin instructions. Take 1 tablet (250 mg) by mouth in the morning and take 2 tablets (500 mg) by mouth at night    [provider]      Allergies    Statins, Lisinopril, and Latex    Review of Systems   Review of Systems  Physical Exam Updated Vital Signs BP (!) 164/40    Pulse 66    Temp (!) 97.5 F (36.4 C) (Oral)    Resp 16    Ht 6' 1" (1.854 m)    Wt 109.6 kg    SpO2 96%    BMI 31.88 kg/m  Physical Exam Constitutional:      General: He is not in acute distress. HENT:     Head: Normocephalic and atraumatic.  Eyes:     Conjunctiva/sclera: Conjunctivae normal.     Pupils: Pupils are equal, round, and reactive to light.  Cardiovascular:     Rate and Rhythm: Normal rate and regular rhythm.  Pulmonary:     Effort: Pulmonary effort is normal. No respiratory distress.     Comments: Rhonchi, rales bilaterally 83% on room air, improved to 92% on 6L  Abdominal:     General: There is no distension.     Tenderness: There is no abdominal tenderness.  Skin:    General: Skin is warm and dry.  Neurological:     Mental Status: He is alert.    ED Results / Procedures / Treatments   Labs (all labs ordered are listed, but only abnormal results are displayed) Labs Reviewed  COMPREHENSIVE METABOLIC PANEL - Abnormal; Notable for the following components:      Result Value   Potassium 3.2 (*)    Glucose, Bld 285 (*)    BUN 82 (*)    Creatinine, Ser 2.21 (*)    Calcium 8.3 (*)    Total Protein 6.2 (*)    Albumin 2.3 (*)    Total Bilirubin 0.2 (*)    GFR, Estimated 30 (*)    All other components within normal limits  CBC WITH DIFFERENTIAL/PLATELET - Abnormal; Notable for the following components:   WBC 17.3 (*)    RBC 3.92 (*)    Hemoglobin 11.6 (*)    HCT 36.0 (*)    Platelets 416 (*)    Neutro Abs 14.2 (*)    Monocytes Absolute 1.3 (*)    Eosinophils  Absolute 0.6 (*)    Abs Immature Granulocytes 0.29 (*)    All other components within normal limits  URINALYSIS, ROUTINE W REFLEX MICROSCOPIC - Abnormal; Notable for the following components:   Color, Urine STRAW (*)    Glucose, UA 50 (*)  All other components within normal limits  BRAIN NATRIURETIC PEPTIDE - Abnormal; Notable for the following components:   B Natriuretic Peptide 195.4 (*)    All other components within normal limits  GLUCOSE, CAPILLARY - Abnormal; Notable for the following components:   Glucose-Capillary 251 (*)    All other components within normal limits  TROPONIN I (HIGH SENSITIVITY) - Abnormal; Notable for the following components:   Troponin I (High Sensitivity) 41 (*)    All other components within normal limits  RESP PANEL BY RT-PCR (FLU A&B, COVID) ARPGX2  MRSA NEXT GEN BY PCR, NASAL  CULTURE, BLOOD (ROUTINE X 2)  CULTURE, BLOOD (ROUTINE X 2)  EXPECTORATED SPUTUM ASSESSMENT W GRAM STAIN, RFLX TO RESP C  LACTIC ACID, PLASMA  PROTIME-INR  APTT  TSH  STREP PNEUMONIAE URINARY ANTIGEN  CBC WITH DIFFERENTIAL/PLATELET  COMPREHENSIVE METABOLIC PANEL  TROPONIN I (HIGH SENSITIVITY)    EKG EKG Interpretation  Date/Time:  Tuesday November 21 2021 10:28:59 EST Ventricular Rate:  60 PR Interval:  204 QRS Duration: 108 QT Interval:  470 QTC Calculation: 470 R Axis:   18 Text Interpretation: Sinus or ectopic atrial rhythm Atrial premature complex Abnormal R-wave progression, early transition Confirmed by Octaviano Glow [phone]) on 11/24/2021 11:23:54 AM  Radiology DG Chest Port 1 View  Result Date: 12/11/2021 CLINICAL DATA:  Questionable sepsis.  Evaluate for abnormality. EXAM: PORTABLE CHEST 1 VIEW COMPARISON:  11/10/2021. FINDINGS: Increased densities in the left hilum and left lower chest. Overall, low lung volumes. Again noted are surgical clips in the right lower neck or upper chest region. Heart size is upper limits of normal but stable. IMPRESSION: Increased  densities in the left hilum and left lower chest. Findings are concerning for pneumonia. Followup PA and lateral chest X-ray is recommended in 3-4 weeks following trial of antibiotic therapy to ensure resolution and exclude underlying malignancy. Electronically Signed   By: Markus Daft M.D.   On: 11/22/2021 11:50    Procedures .Critical Care Performed by: Wyvonnia Dusky, MD Authorized by: Wyvonnia Dusky, MD   Critical care provider statement:    Critical care time (minutes):  45   Critical care time was exclusive of:  Separately billable procedures and treating other patients   Critical care was necessary to treat or prevent imminent or life-threatening deterioration of the following conditions:  Sepsis   Critical care was time spent personally by me on the following activities:  Ordering and performing treatments and interventions, ordering and review of laboratory studies, ordering and review of radiographic studies, pulse oximetry, review of old charts, examination of patient and evaluation of patient's response to treatment    Medications Ordered in ED Medications  enoxaparin (LOVENOX) injection 40 mg (40 mg Subcutaneous Given 12/08/2021 1612)  acetaminophen (TYLENOL) tablet 650 mg (has no administration in time range)    Or  acetaminophen (TYLENOL) suppository 650 mg (has no administration in time range)  ondansetron (ZOFRAN) tablet 4 mg (has no administration in time range)    Or  ondansetron (ZOFRAN) injection 4 mg (has no administration in time range)  albuterol (PROVENTIL) (2.5 MG/3ML) 0.083% nebulizer solution 2.5 mg (has no administration in time range)  albuterol (PROVENTIL) (2.5 MG/3ML) 0.083% nebulizer solution 2.5 mg (has no administration in time range)  meropenem (MERREM) 1 g in sodium chloride 0.9 % 100 mL IVPB (has no administration in time range)  vancomycin variable dose per unstable renal function (pharmacist dosing) (has no administration in time range)  MEDLINE  mouth  rinse (15 mLs Mouth Rinse Given 12/09/2021 1612)  Chlorhexidine Gluconate Cloth 2 % PADS 6 each (6 each Topical Given 12/15/2021 1506)  0.9 %  sodium chloride infusion (has no administration in time range)  amLODipine (NORVASC) tablet 5 mg (has no administration in time range)  atenolol (TENORMIN) tablet 50 mg (has no administration in time range)  polyvinyl alcohol (LIQUIFILM TEARS) 1.4 % ophthalmic solution 2 drop (has no administration in time range)  clonazePAM (KLONOPIN) tablet 1 mg (has no administration in time range)  ipratropium (ATROVENT) 0.03 % nasal spray 2 spray (has no administration in time range)  levothyroxine (SYNTHROID) tablet 200-300 mcg (has no administration in time range)  ascorbic acid (VITAMIN C) tablet 500 mg (has no administration in time range)  famotidine (PEPCID) tablet 40 mg (has no administration in time range)  insulin aspart (novoLOG) injection 0-9 Units (has no administration in time range)  meropenem (MERREM) 1 g in sodium chloride 0.9 % 100 mL IVPB (0 g Intravenous Stopped 11/22/2021 1352)  vancomycin (VANCOCIN) 2,500 mg in sodium chloride 0.9 % 500 mL IVPB (2,500 mg Intravenous New Bag/Given 11/30/2021 1351)  0.9 %  sodium chloride infusion ( Intravenous New Bag/Given 11/27/2021 1239)  furosemide (LASIX) injection 40 mg (40 mg Intravenous Given 11/18/2021 1612)  potassium chloride SA (KLOR-CON M) CR tablet 20 mEq (20 mEq Oral Given 11/17/2021 1613)  albumin human 25 % solution 50 g ( Intravenous Infusion Verify 12/02/2021 1700)    ED Course/ Medical Decision Making/ A&P Clinical Course as of 12/04/2021 1842  Tue Nov 21, 2021  1155 [facility]CAP antibiotics vanco + meropenem ordered [MT]  1232 Will admit for sepsis PNA - with normal lactate and BP, I would prefer to be judicious with fluids given his known hx of CHF and his hypoxia - will start on infusion here 100 cc/hr [MT]  1253 Admitted to hospitalist [MT]    Clinical Course User Index [MT] , Carola Rhine, MD                            Medical Decision Making Amount and/or Complexity of Data Reviewed Labs: ordered. Radiology: ordered. ECG/medicine tests: ordered.  Risk Prescription drug management. Decision regarding hospitalization.   This patient presents to the ED with concern for hypoxia, cough. This involves an extensive number of treatment options, and is a complaint that carries with it a high risk of complications and morbidity.  The differential diagnosis includes CHF vs PNA vs other  Co-morbidities that complicate the patient evaluation: dementia  Additional history obtained from patient's wife, EMS  External records from outside source obtained and reviewed including recent hospitalization discharge summary urine culture  I ordered and personally interpreted labs.  The pertinent results include:  WBC elevated, lactate wnl Chronic CKD, Cr at baseline level 2.2 here - no emergent indication for dialysis  I ordered imaging studies including dg chest I independently visualized and interpreted imaging which showed left focal infiltrate I agree with the radiologist interpretation  The patient was maintained on a cardiac monitor.  I personally viewed and interpreted the cardiac monitored which showed an underlying rhythm of: sinus  Per my interpretation the patient's ECG shows sinus rhythm, no acute ischemic findings  I ordered medication including IV vanco, meropenem for HCAP, NS infusion I have reviewed the patients home medicines and have made adjustments as needed  Test Considered:  - lower suspicion for acute PE  After the interventions noted above,  I reevaluated the patient and found that they have: stayed the same  No worsening hypoxia - stable on 6L [place]   Dispostion:  After consideration of the diagnostic results and the patients response to treatment, I feel that the patent would benefit from medical admission         Final Clinical Impression(s) / ED Diagnoses HCAP   Rx /  DC Orders ED Discharge Orders     None         , Carola Rhine, MD 12/14/2021 1845    Wyvonnia Dusky, MD 11/17/2021 1845

## 2021-11-21 NOTE — Progress Notes (Addendum)
Pharmacy Antibiotic Note ? ?Daniel Ochoa is a 81 y.o. male admitted on 11/18/2021. He presented with low oxygen saturation and concern for pneumonia. Patient recently discharged from [facility] on ertapenem daily for UTI, last day of therapy 11/20/21. Pharmacy has been consulted for vancomycin and meropenem dosing for pneumonia. ? ?Today, 12/02/2021 ?- Afebrile, WBC elevated ?- SCr 2.21, lower than apparent baseline - CKD IV followed outpatient with recent placement of AVF ? ?Plan: ?- Patient received vancomycin 2.5 g IV x 1 in the ED. Given renal function, will check vancomycin level at 1300 tomorrow to assess for clearance ?- Meropenem 1 g q12h based on current renal function ?- MRSA PCR to help guide therapy ?- Continue to monitor cultures, labs and clinical progress for further antibiotic dosage adjustments or appropriate de-escalation ? ?Height: '6\' 1"'$  (185.4 cm) ?Weight: 111.6 kg (246 lb) ?IBW/kg (Calculated) : 79.9 ? ?Temp (24hrs), Avg:96.9 ?F (36.1 ?C), Min:95.3 ?F (35.2 ?C), Max:98 ?F (36.7 ?C) ? ?Recent Labs  ?Lab 11/15/21 ?0136 11/16/21 ?0106 11/17/21 ?8676 12/09/2021 ?1050  ?WBC 11.7* 12.8* 13.3* 17.3*  ?CREATININE 2.86*  --   --  2.21*  ?LATICACIDVEN  --   --   --  1.2  ?  ?Estimated Creatinine Clearance: 35.5 mL/min (A) (by C-G formula based on SCr of 2.21 mg/dL (H)).   ? ?Allergies  ?Allergen Reactions  ? Statins Other (See Comments)  ?  Elevated CK level  ? Lisinopril   ?  Other reaction(s): stopped by pulmonology  to help with breathing  ? Latex Rash  ?  Severe blistery rash  ? ? ?Antimicrobials this admission: ?Vancomycin 3/7 >> ?Meropenem 3/7 >> ? ? ?Microbiology results: ?3/7 BCx: pending ?3/7 MRSA PCR: ordered ? ?Thank you for allowing pharmacy to be a part of this patient?s care. ? ?Tawnya Crook, PharmD, BCPS ?Clinical Pharmacist ?12/06/2021 2:00 PM ? ? ?

## 2021-11-21 NOTE — ED Notes (Signed)
Pt transported to imaging.

## 2021-11-21 NOTE — Progress Notes (Signed)
PHARMACY -  BRIEF ANTIBIOTIC NOTE  ? ?Pharmacy has received consult(s) for vancomycin and meropenem from an ED provider.  The patient's profile has been reviewed for ht/wt/allergies/indication/available labs.   ? ?One time order(s) placed for vancomycin 2.5 g + meropenem 1 g ? ?Further antibiotics/pharmacy consults should be ordered by admitting physician if indicated.       ?                ?Thank you, ? ?Tawnya Crook, PharmD, BCPS ?Clinical Pharmacist ?12/15/2021 12:10 PM ? ? ?

## 2021-11-21 NOTE — ED Triage Notes (Signed)
Patient coming from [facility] and rehabilitation with c/o low oxygen sat and PNA. Patient sat at the facility was in the 70% on room air. Patient currently on non-rebreather mask. Per facility patient is be treated currently for UTI. ?

## 2021-11-21 NOTE — ED Notes (Addendum)
Pt stated he was hot. bearhugger removed. Oral temp 98. ?

## 2021-11-21 NOTE — H&P (Signed)
History and Physical    Patient: Daniel Ochoa:[id number] DOB: 1941/08/04 DOA: 12/13/2021 DOS: the patient was seen and examined on 12/09/2021 PCP: Donnajean Lopes, MD  Patient coming from: SNF [facility]).  Chief Complaint: Shortness of breath.  HPI: Daniel Ochoa is a 81 y.o. male with medical history significant of DVT, pulmonary embolism, osteoarthritis, chronic fatigue, stage IV chronic kidney disease, type 2 diabetes, diabetic peripheral neuropathy, constipation, hypertension, fibromyalgia, hypothyroid, obesity, OSA on CPAP, dementia, unspecified skin cancer who  was recently discharged from the hospital due to TIA, E. coli UTI RUE edema and is returning to the emergency department due to shortness of breath with hypoxia associated with weakness, productive cough.  His wife stated that she does not know the color of the sputum as he swallows it.  She also mentioned that he had an uneventful weekend, but did not start PT until yesterday.  She stated that his appetite has not been good at times.  According to her, his RUE edema has improved significantly over the past few days.  ED course: Initial vital signs were temperature 95.3 F, pulse 61, respirations 24, BP 126/51 mmHg and O2 sat 92% on 6 L of nasal cannula oxygen.  The patient received meropenem 1 g and vancomycin 2500 mg IVPB.  I added furosemide 40 mg IVP, albumin 50 g IVP and K-Lor 20 mEq p.o. x1 dose.  Lab work: Urinalysis showed glucosuria 50 mg/dL but was otherwise unremarkable.  CBC with a white count 17.3, hemoglobin 11.6 g/dL and platelets 416.  PT was 14.3, INR 1.1 and PTT 27.  Lactic acid was normal.  Troponin was 41 and then 65 ng/L.  BNP 895.4 pg/mL.  CMP showed a potassium of 3.2 mmol/L, the rest of the electrolytes were normal after calcium was corrected.  Glucose 285, BUN 82 and creatinine 2.21 mg/dL.  Total protein 6.2 and albumin 2.3 g/dL.  The rest of the LFTs were unremarkable.  Imaging:  One-view portable chest radiograph showed increased densities in the left hilum and left lower chest concerning for pneumonia.  PA and lateral x-ray recommended in 3 to 4 weeks following antibiotic therapy to ensure resolution and exclude underlying malignancy.   Review of Systems: As mentioned in the history of present illness. All other systems reviewed and are negative. Past Medical History:  Diagnosis Date   Acute deep vein thrombosis (DVT) of tibial vein of right lower extremity ([place]) 04/13/2014   Acute pulmonary embolism (HCC) 04/15/2014   Arthritis    Chronic fatigue    Chronic kidney disease, stage IV (severe) (HCC)    Complication of anesthesia    one surgery took 4 people to hold him down 2000   Constipation    Diabetic peripheral neuropathy ([place]) 10/26/2015   DM (diabetes mellitus), type 2, uncontrolled, with renal complications    [facility]   DVT (deep venous thrombosis) ([place])    RLE DVT with intermediate risk VQ scan for PE 03/2014   Dyspnea    Essential hypertension    Fibromyalgia    Hypothyroidism    Hypoxia 04/13/2014   Neuropathy    Obesity    OSA on CPAP    Pulmonary embolism ([place]) 04/13/2014   VQ 04/13/14 intermediate assoc with R DVT > on coumadin since Echo 04/14/14 > PA peak pressure: 89 mm Hg (S).    Past Surgical History:  Procedure Laterality Date   AV FISTULA PLACEMENT Left 04/18/2017   Procedure: ARTERIOVENOUS (AV) FISTULA  CREATION;  Surgeon: Angelia Mould, MD;  Location: [place];  Service: Vascular;  Laterality: Left;   [place] Right 11/14/2020   Procedure: BASCILIC VEIN TRANSPOSITION 1ST STAGE RIGHT;  Surgeon: Angelia Mould, MD;  Location: [place];  Service: Vascular;  Laterality: Right;   [place] Right 01/05/2021   Procedure: RIGHT SECOND STAGE [place];  Surgeon: Serafina Mitchell, MD;  [facility];  Service: Vascular;  Laterality: Right;   CERVICAL LAMINECTOMY     LIGATION OF  ARTERIOVENOUS  FISTULA Left 06/01/2021   Procedure: LIGATION OF LEFT ARM FISTULA;  Surgeon: Serafina Mitchell, MD;  [facility];  Service: Vascular;  Laterality: Left;   LUMBAR FUSION     x 2   ROTATOR CUFF REPAIR Right    THYROIDECTOMY, PARTIAL     Social History:  reports that he has quit smoking. His smoking use included pipe. He has never used smokeless tobacco. He reports that he does not drink alcohol and does not use drugs.  Allergies  Allergen Reactions   Statins Other (See Comments)    Elevated CK level   Lisinopril     Other reaction(s): stopped by pulmonology  to help with breathing   Latex Rash    Severe blistery rash    Family History  Problem Relation Age of Onset   Cancer Father     Prior to Admission medications   Medication Sig Start Date End Date Taking? Authorizing Provider  Accu-Chek Softclix Lancets lancets USE TO CHECK BLOOD SUGAR UP TO TID E11.29 12/21/19   [provider]  amLODipine (NORVASC) 5 MG tablet Take 5 mg by mouth daily.  03/18/15   [provider]  atenolol (TENORMIN) 50 MG tablet Take 50 mg by mouth 2 (two) times daily.  01/31/14   [provider]  Calcium-Vitamin D-Vitamin K ([facility] CALCIUM SOFT CHEWS) 650-12.5-40 MG-MCG-MCG CHEW Chew 1-2 tablets by mouth 2 (two) times daily. Take 1 tablet in the morning & take 2 tablets by mouth at night    [provider]  carboxymethylcellulose (REFRESH PLUS) 0.5 % SOLN Place 2 drops into both eyes 3 (three) times daily as needed (allergy/irritated eyes.).    [provider]  clonazePAM (KLONOPIN) 1 MG tablet Take 1 tablet (1 mg total) by mouth at bedtime. 11/17/21   Patrecia Pour, MD  famotidine (PEPCID) 40 MG tablet Take 40 mg by mouth at bedtime.  03/17/14   [provider]  FREESTYLE TEST STRIPS test strip USE AS DIRECTED TO TEST BLOOD SUGAR 5 TIMES PER DAY. DX: E11.65 06/06/18   [provider]  furosemide (LASIX) 40 MG tablet Take 40-80 mg by mouth See  admin instructions. Take 2 tablets (80 mg) by mouth in the morning & take 1 tablet (40 mg) by mouth in the afternoon. 10/20/14   [provider]  insulin aspart (NOVOLOG) 100 UNIT/ML injection Inject 0-9 Units into the skin 3 (three) times daily with meals. 11/17/21   Patrecia Pour, MD  ipratropium (ATROVENT) 0.03 % nasal spray Place 2 sprays into both nostrils daily as needed for rhinitis.    [provider]  Lancets Misc. (ACCU-CHEK FASTCLIX LANCET) KIT USE TO CHECK BLOOD SUGAR UP TO TID E11.29 12/23/19   [provider]  SYNTHROID 200 MCG tablet Take 200-300 mcg by mouth See admin instructions. Take 1 tablet (200 mcg) by mouth daily except on Sundays take 1.5 tablets (300 mcg) tablet by mouth 01/12/14  [provider]  vitamin C (ASCORBIC ACID) 250 MG tablet Take 250-500 mg by mouth See admin instructions. Take 1 tablet (250 mg) by mouth in the morning and take 2 tablets (500 mg) by mouth at night    [provider]    Physical Exam: Vitals:   12/15/2021 1230 12/13/2021 1300 12/12/2021 1330 11/24/2021 1400  BP: (!) 127/51 (!) 126/49 (!) 128/47 (!) 128/52  Pulse: 65 60 61 (!) 57  Resp: (!) 30 (!) '22 16 18  ' Temp:      TempSrc:      SpO2: 90% (!) 89% 91% 93%  Weight:      Height:       Physical Exam Constitutional:      Appearance: He is obese. He is ill-appearing.  HENT:     Head: Normocephalic.     Comments: Left temple skin Ca excision scar.    Mouth/Throat:     Mouth: Mucous membranes are moist.  Eyes:     Pupils: Pupils are equal, round, and reactive to light.  Neck:     Vascular: No JVD.  Cardiovascular:     Rate and Rhythm: Normal rate. Occasional Extrasystoles are present.    Heart sounds: S1 normal and S2 normal. No murmur heard.    Arteriovenous access: Right and left arteriovenous access is present.    Comments: Right with good thrill. Nonworking left upper arm fistula closed by CVS. Abdominal:     General: There is no distension.      Palpations: Abdomen is soft.  Musculoskeletal:     Right upper arm: Edema present.     Cervical back: Neck supple.     Right lower leg: 1+ Pitting Edema present.     Left lower leg: 1+ Pitting Edema present.     Comments: Severe generalized weakness and physical deconditioning.  Neurological:     Mental Status: He is alert. Mental status is at baseline.    Data Reviewed:  There are no new results to review at this time.  Assessment and Plan: Principal Problem:   Acute respiratory failure with hypoxia (HCC) Symptoms and chest radiograph suspicious for pneumonia. The patient also seems to be volume overloaded. Admit to stepdown/inpatient. Continue supplemental oxygen. Bronchodilators 3 times daily and as needed. Continue meropenem per pharmacy. Continue vancomycin per pharmacy. Check strep pneumoniae urinary antigen. Check sputum Gram stain, culture and sensitivity. Follow blood culture and sensitivity. Follow CBC and chemistry in AM.  Active Problems:   Diabetes mellitus type 2 with complications (HCC) Hemoglobin A 6.0% on 11/10/2021. Carbohydrate modified diet. CBG monitoring with RI SS.    Essential hypertension Continue amlodipine 5 mg p.o. daily. Continue atenolol 50 mg p.o. twice daily. Switch furosemide to IV form. Monitor BP, HR, renal function and electrolytes.    OSA on CPAP Continue CPAP/BiPAP at bedtime.    Hypothyroidism Continue Synthroid 300 mg p.o. daily.    Chronic kidney disease, stage IV (severe) (HCC) Monitor renal function electrolytes. HD has been anticipated in the next few months. Follow-up with nephrology as scheduled.    Alzheimer's disease ([place]) Supportive care. Clonazepam as needed for anxiety.    Class 1 obesity Lifestyle modifications as an outpatient.    Advance Care Planning:   Code Status: Full Code   Consults:   Family Communication:   Severity of Illness: The appropriate patient status for this patient is OBSERVATION.  Observation status is judged to be reasonable and necessary in order to provide the required intensity of  service to ensure the patient's safety. The patient's presenting symptoms, physical exam findings, and initial radiographic and laboratory data in the context of their medical condition is felt to place them at decreased risk for further clinical deterioration. Furthermore, it is anticipated that the patient will be medically stable for discharge from the hospital within 2 midnights of admission.   Author: Reubin Milan, MD 12/01/2021 2:36 PM  For on call review [email or web address].   This document was prepared using Dragon voice recognition software and may contain some unintended transcription errors.

## 2021-11-22 ENCOUNTER — Inpatient Hospital Stay (HOSPITAL_COMMUNITY): Payer: Medicare Other

## 2021-11-22 DIAGNOSIS — M79605 Pain in left leg: Secondary | ICD-10-CM | POA: Diagnosis not present

## 2021-11-22 DIAGNOSIS — J9601 Acute respiratory failure with hypoxia: Secondary | ICD-10-CM | POA: Diagnosis not present

## 2021-11-22 DIAGNOSIS — R9431 Abnormal electrocardiogram [ECG] [EKG]: Secondary | ICD-10-CM

## 2021-11-22 DIAGNOSIS — R0603 Acute respiratory distress: Secondary | ICD-10-CM

## 2021-11-22 DIAGNOSIS — M79604 Pain in right leg: Secondary | ICD-10-CM

## 2021-11-22 DIAGNOSIS — J188 Other pneumonia, unspecified organism: Secondary | ICD-10-CM | POA: Diagnosis not present

## 2021-11-22 LAB — ECHOCARDIOGRAM COMPLETE
AR max vel: 2.95 cm2
AV Area VTI: 3.28 cm2
AV Area mean vel: 3.28 cm2
AV Mean grad: 3 mmHg
AV Peak grad: 7.2 mmHg
Ao pk vel: 1.34 m/s
Area-P 1/2: 2.54 cm2
Height: 73 in
MV VTI: 2.07 cm2
S' Lateral: 3.05 cm
Weight: 3865.99 oz

## 2021-11-22 LAB — CBC WITH DIFFERENTIAL/PLATELET
Abs Immature Granulocytes: 0.38 10*3/uL — ABNORMAL HIGH (ref 0.00–0.07)
Basophils Absolute: 0.1 10*3/uL (ref 0.0–0.1)
Basophils Relative: 0 %
Eosinophils Absolute: 0.1 10*3/uL (ref 0.0–0.5)
Eosinophils Relative: 0 %
HCT: 38.4 % — ABNORMAL LOW (ref 39.0–52.0)
Hemoglobin: 12 g/dL — ABNORMAL LOW (ref 13.0–17.0)
Immature Granulocytes: 1 %
Lymphocytes Relative: 2 %
Lymphs Abs: 0.7 10*3/uL (ref 0.7–4.0)
MCH: 29.3 pg (ref 26.0–34.0)
MCHC: 31.3 g/dL (ref 30.0–36.0)
MCV: 93.9 fL (ref 80.0–100.0)
Monocytes Absolute: 1.7 10*3/uL — ABNORMAL HIGH (ref 0.1–1.0)
Monocytes Relative: 6 %
Neutro Abs: 26.9 10*3/uL — ABNORMAL HIGH (ref 1.7–7.7)
Neutrophils Relative %: 91 %
Platelets: 397 10*3/uL (ref 150–400)
RBC: 4.09 MIL/uL — ABNORMAL LOW (ref 4.22–5.81)
RDW: 13.8 % (ref 11.5–15.5)
WBC: 29.8 10*3/uL — ABNORMAL HIGH (ref 4.0–10.5)
nRBC: 0 % (ref 0.0–0.2)

## 2021-11-22 LAB — BLOOD GAS, ARTERIAL
Acid-Base Excess: 1.3 mmol/L (ref 0.0–2.0)
Bicarbonate: 27.2 mmol/L (ref 20.0–28.0)
O2 Saturation: 94.6 %
Patient temperature: 37
pCO2 arterial: 47 mmHg (ref 32–48)
pH, Arterial: 7.37 (ref 7.35–7.45)
pO2, Arterial: 75 mmHg — ABNORMAL LOW (ref 83–108)

## 2021-11-22 LAB — COMPREHENSIVE METABOLIC PANEL
ALT: 13 U/L (ref 0–44)
AST: 14 U/L — ABNORMAL LOW (ref 15–41)
Albumin: 3.2 g/dL — ABNORMAL LOW (ref 3.5–5.0)
Alkaline Phosphatase: 51 U/L (ref 38–126)
Anion gap: 12 (ref 5–15)
BUN: 78 mg/dL — ABNORMAL HIGH (ref 8–23)
CO2: 26 mmol/L (ref 22–32)
Calcium: 9 mg/dL (ref 8.9–10.3)
Chloride: 104 mmol/L (ref 98–111)
Creatinine, Ser: 2.46 mg/dL — ABNORMAL HIGH (ref 0.61–1.24)
GFR, Estimated: 26 mL/min — ABNORMAL LOW (ref >60)
Glucose, Bld: 291 mg/dL — ABNORMAL HIGH (ref 70–99)
Potassium: 4.2 mmol/L (ref 3.5–5.1)
Sodium: 142 mmol/L (ref 135–145)
Total Bilirubin: 0.5 mg/dL (ref 0.3–1.2)
Total Protein: 6.8 g/dL (ref 6.5–8.1)

## 2021-11-22 LAB — GLUCOSE, CAPILLARY
Glucose-Capillary: 226 mg/dL — ABNORMAL HIGH (ref 70–99)
Glucose-Capillary: 231 mg/dL — ABNORMAL HIGH (ref 70–99)
Glucose-Capillary: 239 mg/dL — ABNORMAL HIGH (ref 70–99)
Glucose-Capillary: 279 mg/dL — ABNORMAL HIGH (ref 70–99)

## 2021-11-22 LAB — D-DIMER, QUANTITATIVE: D-Dimer, Quant: 4.72 ug/mL-FEU — ABNORMAL HIGH (ref 0.00–0.50)

## 2021-11-22 LAB — PROCALCITONIN: Procalcitonin: 0.56 ng/mL

## 2021-11-22 LAB — STREP PNEUMONIAE URINARY ANTIGEN: Strep Pneumo Urinary Antigen: NEGATIVE

## 2021-11-22 MED ORDER — SODIUM CHLORIDE 0.9 % IV BOLUS
250.0000 mL | INTRAVENOUS | Status: AC
  Administered 2021-11-22: 250 mL via INTRAVENOUS

## 2021-11-22 MED ORDER — ENOXAPARIN SODIUM 120 MG/0.8ML IJ SOSY
110.0000 mg | PREFILLED_SYRINGE | Freq: Two times a day (BID) | INTRAMUSCULAR | Status: DC
  Administered 2021-11-22 – 2021-11-23 (×2): 110 mg via SUBCUTANEOUS
  Filled 2021-11-22 (×2): qty 0.74

## 2021-11-22 MED ORDER — INSULIN ASPART 100 UNIT/ML IJ SOLN
0.0000 [IU] | Freq: Three times a day (TID) | INTRAMUSCULAR | Status: DC
  Administered 2021-11-22: 8 [IU] via SUBCUTANEOUS
  Administered 2021-11-23: 12:00:00 5 [IU] via SUBCUTANEOUS
  Administered 2021-11-23: 18:00:00 8 [IU] via SUBCUTANEOUS
  Administered 2021-11-23 – 2021-11-24 (×4): 5 [IU] via SUBCUTANEOUS

## 2021-11-22 MED ORDER — INSULIN ASPART 100 UNIT/ML IJ SOLN
0.0000 [IU] | Freq: Every day | INTRAMUSCULAR | Status: DC
  Administered 2021-11-22: 2 [IU] via SUBCUTANEOUS
  Administered 2021-11-23: 23:00:00 3 [IU] via SUBCUTANEOUS

## 2021-11-22 MED ORDER — CHLORHEXIDINE GLUCONATE 0.12 % MT SOLN
15.0000 mL | Freq: Two times a day (BID) | OROMUCOSAL | Status: DC
  Administered 2021-11-22 – 2021-11-23 (×2): 15 mL via OROMUCOSAL
  Filled 2021-11-22 (×3): qty 15

## 2021-11-22 MED ORDER — ORAL CARE MOUTH RINSE: 15.0000 mL | Freq: Two times a day (BID) | OROMUCOSAL | Status: DC

## 2021-11-22 MED ORDER — INSULIN ASPART 100 UNIT/ML IJ SOLN
3.0000 [IU] | Freq: Three times a day (TID) | INTRAMUSCULAR | Status: DC
  Administered 2021-11-22 – 2021-11-23 (×3): 3 [IU] via SUBCUTANEOUS

## 2021-11-22 MED ORDER — ATENOLOL 25 MG PO TABS
12.5000 mg | ORAL_TABLET | Freq: Two times a day (BID) | ORAL | Status: DC
Start: 2021-11-23 — End: 2021-11-23
  Administered 2021-11-23: 09:00:00 12.5 mg via ORAL
  Filled 2021-11-22: qty 1

## 2021-11-22 NOTE — Progress Notes (Signed)
Echocardiogram ?2D Echocardiogram has been performed. ? ?Daniel Ochoa ?11/22/2021, 1:06 PM ?

## 2021-11-22 NOTE — Progress Notes (Signed)
Modified Barium Swallow Progress Note ? ?Patient Details  ?Name: Daniel Ochoa ?MRN: [id number] ?Date of Birth: [birth year] ? ?Today's Date: 11/22/2021 ? ?Modified Barium Swallow completed.  Full report located under Chart Review in the Imaging Section. ? ?Brief recommendations include the following: ? ?Clinical Impression ? Patient presents with moderately severe oropharyngeal dysphagia with sensorimotor deficits.  Impaired oral transiting/bolus propulsion noted due to weakness.  Pharyngeal swallow trigger variable - with thin and nectar liquids at pyriform sinus which allowed aspiration during the swallow.  2nd bolus of pudding *later in the study* triggered at vallecular space at 2 seconds and cracker triggered at 5 seconds. Pt impaired pharyngeal motility -allows vallecular and pyriform sinus retention, that mixed with secretions.  Cues to swallow not consistently effective - however when cued to throat clear or cough, pt produced cough and reflexively swallowed. He however does not fully clear retention in pharynx.   Significant aspiration with thin and nectar via cup/straw noted - with cough *likely when aspirates reached the carina* but cough was not effective to clear.  Postures not tested due to patient?s excessive limited head ROM.    Pt is elevated aspiration and malnutrition risk due to his level of dysphagia. Currently - recommend consider palliative consult to establish [place] given pt?s recent hospital stay, progressive neuro diagnosis and deconditioning.  At this time, recommend consider CREAMY puree and nectar liquid diet with strict precautions to mitigate aspiration.  Recommend pt be allowed ice chips and tsps of thin water any time.  He only coughed during the MBS when he aspirated - thus if he is coughing with intake, he is definitively aspirating.  Frequent small meals may help maximize nutrition with least risk. Skilled SLP included determining effective compensation strategies and pt  education.  Recommended pt work on strengthening his cough and ?hock? expectoration strength for maximal airway protection. Per prior clinical swallow evaluation, pt's swallow function currently appears significantly worse - ? source? ?  ?Swallow Evaluation Recommendations ? ?   ? ? SLP Diet Recommendations: Dysphagia 1 (Puree) solids;Nectar thick liquid;Ice chips PRN after oral care ? ? Liquid Administration via: Spoon;No straw ? ?   ? ?   ? ? Compensations: Slow rate;Small sips/bites;Other (Comment) (intermittent cough and reswallow; assure pt swallows before giving more intake - observe larynx) Multiple swallows  ? ?   ? ? Oral Care Recommendations: Oral care QID ? ? Other Recommendations: Order thickener from pharmacy ? ? ?Kathleen Lime, MS CCC SLP ?Acute Rehab Services ?Office [phone] ?Pager [phone] ? ?Macario Golds ?11/22/2021,11:45 AM ?

## 2021-11-22 NOTE — Progress Notes (Signed)
Bilateral lower extremity venous duplex has been completed. ?Preliminary results can be found in CV Proc through chart review.  ? ?11/22/21 9:03 AM ?Carlos Levering RVT   ?

## 2021-11-22 NOTE — Progress Notes (Signed)
Chaplain spent time with Ladon's wife, Daniel Ochoa, who asked for prayer.  She is overwhelmed and is trying her best to do what she needs to do for [facility].  She has a brother who is supportive and 3 neighbors who check in on her regularly.  Chaplain encouraged reaching out for support and taking one step at a time. ? ?[employer], Bcc ?Pager, [phone] ?3:49 PM ? ?

## 2021-11-22 NOTE — Evaluation (Signed)
Clinical/Bedside Swallow Evaluation ?Patient Details  ?Name: Daniel Ochoa ?MRN: [id number] ?Date of Birth: May 02, 1941 ? ?Today's Date: 11/22/2021 ?Time: SLP Start Time (ACUTE ONLY): [id number] SLP Stop Time (ACUTE ONLY): 0940 ?SLP Time Calculation (min) (ACUTE ONLY): 49 min ? ?Past Medical History:  ?Past Medical History:  ?Diagnosis Date  ? Acute deep vein thrombosis (DVT) of tibial vein of right lower extremity ([place]) 04/13/2014  ? Acute pulmonary embolism ([place]) 04/15/2014  ? Arthritis   ? Chronic fatigue   ? Chronic kidney disease, stage IV (severe) ([place])   ? Complication of anesthesia   ? one surgery took 4 people to hold him down 2000  ? Constipation   ? Diabetic peripheral neuropathy ([place]) 10/26/2015  ? DM (diabetes mellitus), type 2, uncontrolled, with renal complications   ? [facility]  ? DVT (deep venous thrombosis) ([place])   ? RLE DVT with intermediate risk VQ scan for PE 03/2014  ? Dyspnea   ? Essential hypertension   ? Fibromyalgia   ? Hypothyroidism   ? Hypoxia 04/13/2014  ? Neuropathy   ? Obesity   ? OSA on CPAP   ? Pulmonary embolism ([place]) 04/13/2014  ? VQ 04/13/14 intermediate assoc with R DVT > on coumadin since Echo 04/14/14 > PA peak pressure: 89 mm Hg (S).   ? ?Past Surgical History:  ?Past Surgical History:  ?Procedure Laterality Date  ? AV FISTULA PLACEMENT Left 04/18/2017  ? Procedure: ARTERIOVENOUS (AV) FISTULA CREATION;  Surgeon: Angelia Mould, MD;  Location: [place];  Service: Vascular;  Laterality: Left;  ? BASCILIC VEIN TRANSPOSITION Right 11/14/2020  ? Procedure: BASCILIC VEIN TRANSPOSITION 1ST STAGE RIGHT;  Surgeon: Angelia Mould, MD;  Location: [place];  Service: Vascular;  Laterality: Right;  ? BASCILIC VEIN TRANSPOSITION Right 01/05/2021  ? Procedure: RIGHT SECOND STAGE BASCILIC VEIN TRANSPOSITION;  Surgeon: Serafina Mitchell, MD;  [facility];  Service: Vascular;  Laterality: Right;  ? CERVICAL LAMINECTOMY    ? LIGATION OF ARTERIOVENOUS  FISTULA Left 06/01/2021  ? Procedure:  LIGATION OF LEFT ARM FISTULA;  Surgeon: Serafina Mitchell, MD;  [facility];  Service: Vascular;  Laterality: Left;  ? LUMBAR FUSION    ? x 2  ? ROTATOR CUFF REPAIR Right   ? THYROIDECTOMY, PARTIAL    ? ?HPI:  ?81 y/o male presented to ED from SNF (after having been at SNF only few days) with chest congestion, cough and found to have pna.  Pt recently discharged from [facility] after admission for concern for TIA = due to 2 days of slurred speech and weakness. Unable to obtain MRI due to kyphosis per notes. CT head unremarkable. Patient with recent UTI x 2-3 weeks ago.  PMH: DM type 2, Alzheimer's, Afib, CKD stage IV, DVT, unspecified skin cancer, constipation, peripheral neuropathy, chronic fatigue, fibromyalgia, OEA.  Swallow evaluation conducted during prior admit without evidence of dysphagia and regular/thin diet was advised.  CXR showed increased densities in left hilum and left lower chest.   Wife admits pt has been coughing on occasion with liquids more than foods over the last four months.  She states pt mostly slept at home prior to SNF admit due to his medical diagnoses. He remains a full code at this time.  ?  ?Assessment / Plan / Recommendation  ?Clinical Impression ? Patient currently presents with increasing clinical indications of dysphagia/aspiration today- much more pronounced than one week ago.  Today pt with congested cough without ability to clear, multitude  of swallows with all boluses, anterior labial spikllage on right with liquids and immediate cough with nectar liquid swallows.  Wife reports does cough "some" with liquids more than foods - over the last four months with gradual onset.  SLP suspects some component of chronic aspiration with liquids with impaired tolerance due to recent admission, deconditioning, etc.  MBS indicated to adequately assess oropharyngeal swallow to help in care plan for this pt. Wife, pt, RN and MD informed of recommendatins and all in agreement with  plan. ?SLP Visit Diagnosis: Dysphagia, unspecified (R13.10) ?   ?Aspiration Risk ? Risk for inadequate nutrition/hydration;Severe aspiration risk  ?  ?Diet Recommendation NPO;Ice chips PRN after oral care  ? ?Medication Administration: Whole meds with puree (medicine with puree)  ?  ?Other  Recommendations Oral Care Recommendations: Oral care QID   ? ?Recommendations for follow up therapy are one component of a multi-disciplinary discharge planning process, led by the attending physician.  Recommendations may be updated based on patient status, additional functional criteria and insurance authorization. ? ?Follow up Recommendations Skilled nursing-short term rehab (<3 hours/day)  ? ? ?  ?Assistance Recommended at Discharge Frequent or constant Supervision/Assistance  ?Functional Status Assessment Patient has had a recent decline in their functional status and demonstrates the ability to make significant improvements in function in a reasonable and predictable amount of time.  ?Frequency and Duration   TBD ?  ?  ?   ? ?Prognosis Prognosis for Safe Diet Advancement: Guarded  ? ?  ? ?Swallow Study   ?General HPI: 81 y/o male presented to ED from SNF (after having been at SNF only few days) with chest congestion, cough and found to have pna.  Pt recently discharged from [facility] after admission for concern for TIA = due to 2 days of slurred speech and weakness. Unable to obtain MRI due to kyphosis per notes. CT head unremarkable. Patient with recent UTI x 2-3 weeks ago.  PMH: DM type 2, Alzheimer's, Afib, CKD stage IV, DVT, unspecified skin cancer, constipation, peripheral neuropathy, chronic fatigue, fibromyalgia, OEA.  Swallow evaluation conducted during prior admit without evidence of dysphagia and regular/thin diet was advised.  CXR showed increased densities in left hilum and left lower chest.   Wife admits pt has been coughing on occasion with liquids more than foods over the last four months.  She states pt  mostly slept at home prior to SNF admit due to his medical diagnoses. He remains a full code at this time. ?Type of Study: Bedside Swallow Evaluation ?Previous Swallow Assessment: prior bse 11/13/21 ?Diet Prior to this Study: NPO ?Temperature Spikes Noted: No ?Respiratory Status: Nasal cannula ?History of Recent Intubation: No ?Behavior/Cognition: Alert;Cooperative;Other (Comment);Requires cueing (flat affect) ?Oral Care Completed by SLP: Yes ?Oral Cavity - Dentition: Adequate natural dentition ?Self-Feeding Abilities: Total assist ?Patient Positioning: Upright in bed ?Baseline Vocal Quality: Hoarse;Low vocal intensity ?Volitional Cough: Congested;Weak ?Volitional Swallow: Unable to elicit  ?  ?Oral/Motor/Sensory Function Overall Oral Motor/Sensory Function: Generalized oral weakness (left upper eye lid droops - normal per pt/wife)   ?Ice Chips Ice chips: Within functional limits ?Presentation: Spoon   ?Thin Liquid Thin Liquid: Not tested  ?  ?Nectar Thick Nectar Thick Liquid: Impaired ?Presentation: Cup;Self Fed;Spoon;Straw ?Oral Phase Impairments: Reduced labial seal ?Oral phase functional implications: Right anterior spillage ?Pharyngeal Phase Impairments: Multiple swallows;Cough - Immediate   ?Honey Thick Honey Thick Liquid: Not tested   ?Puree Puree: Impaired ?Presentation: Spoon ?Pharyngeal Phase Impairments: Multiple swallows   ?Solid ? ? ?  Solid: Not tested  ? ?  ? ?Macario Golds ?11/22/2021,10:21 AM ? ?Kathleen Lime, MS CCC SLP ?Acute Rehab Services ?Office [phone] ?Pager [phone] ? ? ?

## 2021-11-22 NOTE — Progress Notes (Addendum)
Speech Language Pathology Treatment: Dysphagia  ?Patient Details ?Name: Daniel Ochoa ?MRN: [id number] ?DOB: [birth year] ?Today's Date: 11/22/2021 ?Time: 1914-7829 ?SLP Time Calculation (min) (ACUTE ONLY): 13 min ? ?Assessment / Plan / Recommendation ?Clinical Impression ? SLP visit to address dysphagia goals including educating wife re: MBS study - Pt undergoing ECHO at this time and is lethargic.  Wife reports pt has "Not woken up since he got back" from xray.  She reports this is normal for him for "years" and he received disability at age approximately 15 due to his progressive fibromyalgia.    ? ?SLP showed wife normal MBS online as well as pt's MBS study - showing level of dysphagia and sensorimotor deficits as well as addressing feeding tubes lack of benefit with patient's over the age of 29 with cognitive deficits and level of burden is higher than benefit.  Aspiration risk ongoing regardless of care plan. SLP reviewed recommendation for pt and his wife to consider a palliative consult - pt stated "I reckon" in xray and wife agreeable to plan.   ? ?Wife is agreeable to modified diet with accepted aspiration risk = and use of compensation strategies.  She did become tearful during the session and SLP offered verbal support, she politely declined chaplain referral stating she had family to offer verbal support *brother and sister[place].   ? ?Pt's wife reports she wishes for pt to be a DNR.  She is willing for pt to have palliative team to address goals. SLP messaged MD with Jean's wishes.     ? ?  ?HPI HPI: 81 y/o male presented to ED from SNF (after having been at SNF only few days) with chest congestion, cough and found to have pna.  Pt recently discharged from [facility] after admission for concern for TIA = due to 2 days of slurred speech and weakness. Unable to obtain MRI due to kyphosis per notes. CT head unremarkable. Patient with recent UTI x 2-3 weeks ago.  PMH: DM type 2, Alzheimer's, Afib, CKD  stage IV, DVT, unspecified skin cancer, constipation, peripheral neuropathy, chronic fatigue, fibromyalgia, OEA.  Swallow evaluation conducted during prior admit without evidence of dysphagia and regular/thin diet was advised.  CXR showed increased densities in left hilum and left lower chest.   Wife admits pt has been coughing on occasion with liquids more than foods over the last four months.  She states pt mostly slept at home prior to SNF admit due to his medical diagnoses. He remains a full code at this time. ?  ?   ?SLP Plan ? Continue with current plan of care ? ?  ?  ?Recommendations for follow up therapy are one component of a multi-disciplinary discharge planning process, led by the attending physician.  Recommendations may be updated based on patient status, additional functional criteria and insurance authorization. ?  ? ?Recommendations  ?Diet recommendations: Dysphagia 1 (puree);Nectar-thick liquid;Other(comment) (tsps of thin water and ice chips ok) ?Liquids provided via: Teaspoon ?Medication Administration: Whole meds with puree ?Compensations: Slow rate;Small sips/bites;Other (Comment) (intermitten cough and re=swallow; oral suction after intake) ?Postural Changes and/or Swallow Maneuvers: Seated upright 90 degrees;Upright 30-60 min after meal  ?   ?    ?   ? ? ? ? Oral Care Recommendations: Oral care QID ?Follow Up Recommendations: Skilled nursing-short term rehab (<3 hours/day) ?Assistance recommended at discharge: Frequent or constant Supervision/Assistance ?SLP Visit Diagnosis: Dysphagia, oropharyngeal phase (R13.12) ?Plan: Continue with current plan of care ? ? ? ? ?  ?  ?  Kathleen Lime, MS CCC SLP ?Acute Rehab Services ?Office [phone] ?Pager [phone] ? ? ?Macario Golds ? ?11/22/2021, 12:32 PM ?

## 2021-11-22 NOTE — Plan of Care (Signed)
  Problem: Pain Managment: Goal: General experience of comfort will improve Outcome: Progressing   Problem: Safety: Goal: Ability to remain free from injury will improve Outcome: Progressing   

## 2021-11-22 NOTE — Progress Notes (Signed)
This writer placed pt on nocturnal cpap, but unable to maintain o2 saturation >90% consistently.  Cpap adjusted to maximum settings and o2 flow with little to no improvement in o2 sats.  Pt switched to bipap which he tolerated well for the remainder of the night.  RT will reassess and attempt cpap again as tolerated. ?

## 2021-11-22 NOTE — Progress Notes (Signed)
Patient's wife has requested that patient's code status be changed to DNR.  Palliative care team consulted to assist with establishing goals of care.  Informed by bedside RN, patient is unable to lay flat for VQ scan. ?

## 2021-11-22 NOTE — Progress Notes (Signed)
Inpatient Diabetes Program Recommendations ? ?AACE/ADA: New Consensus Statement on Inpatient Glycemic Control (2015) ? ?Target Ranges:  Prepandial:   less than 140 mg/dL ?     Peak postprandial:   less than 180 mg/dL (1-2 hours) ?     Critically ill patients:  140 - 180 mg/dL  ? ?Lab Results  ?Component Value Date  ? GLUCAP 239 (H) 11/22/2021  ? HGBA1C 6.0 (H) 11/10/2021  ? ? ?Review of Glycemic Control ? Latest Reference Range & Units 11/18/2021 18:11 11/30/2021 21:48 11/22/21 08:16 11/22/21 12:08  ?Glucose-Capillary 70 - 99 mg/dL 251 (H) 277 (H) 226 (H) 239 (H)  ?(H): Data is abnormally high ? ?Diabetes history: DM2 ?Outpatient Diabetes medications: Novolog 0-9 units TID  ?Current orders for Inpatient glycemic control: Novolog 0-9 units TID ? ?Inpatient Diabetes Program Recommendations:   ? ?Semglee 10 units QD ? ?Will continue to follow while inpatient. ? ?Thank you, ?Reche Dixon, MSN, RN ?Diabetes Coordinator ?Inpatient Diabetes Program ?[phone] (team pager from 8a-5p) ? ? ? ?

## 2021-11-22 NOTE — Progress Notes (Addendum)
PROGRESS NOTE  Daniel Ochoa GEX:[id number] DOB: Dec 23, 1940 DOA: 12/14/2021 PCP: Donnajean Lopes, MD  HPI/Recap of past 24 hours: Daniel Ochoa is a 81 y.o. male with medical history significant of DVT, pulmonary embolism off oral anticoagulation, osteoarthritis, chronic fatigue, stage IV chronic kidney disease, type 2 diabetes, diabetic peripheral neuropathy, constipation, hypertension, fibromyalgia, hypothyroidism, obesity, OSA on CPAP, dementia, unspecified skin cancer who  was recently discharged from the hospital due to TIA, E. coli UTI, RUE edema, and is returning to [facility] ED due to shortness of breath, hypoxia, productive cough, and generalized weakness weakness.  Work-up revealed left lower lobe HCAP with concern for aspiration. Speech therapy evaluated, post MBS on 11/22/2021, recommendation for dysphagia 1 diet, pure solids and nectar thick liquids.  Currently on IV antibiotics empirically meropenem and IV vancomycin.  MRSA screening test negative, IV vancomycin discontinued.  11/22/2021: Patient was seen and examined at his bedside.  On BiPAP at the time of the visit.  Not in acute distress.  Had pain with palpation of his lower extremities bilaterally.  Doppler ultrasound negative for DVT bilaterally.  Assessment/Plan: Principal Problem:   Acute respiratory failure with hypoxia (HCC) Active Problems:   Diabetes mellitus type 2 with complications (HCC)   Essential hypertension   OSA on CPAP   Chronic kidney disease, stage IV (severe) (HCC)   Alzheimer's disease ([place])   Hypothyroidism   Class 1 obesity   Acute respiratory failure with hypoxia (HCC) secondary to left lower lobe HCAP Symptoms and chest radiograph suspicious for left lower lobe HCAP, POA. Continue empiric IV antibiotics meropenem, IV vancomycin discontinued on 11/22/2021 with negative MRSA screening test WBC uptrending, procalcitonin greater than 0.5. Monitor fever curve and WBC. Bronchodilators, pulmonary  toilet. Continue to follow blood cultures, negative to date. Was on BiPAP overnight, weaned to 10 L HHFNC, continue to wean off oxygen supplementation as tolerated.  Left lower lobe HCAP with concern for aspiration, POA. Failed swallow evaluation at bedside MBS completed on 11/22/2021 recommendation for dysphagia 1 diet, pure solids and nectar thick liquids.  Continue aspiration precautions Continue IV antibiotics empirically.  Elevated troponin suspect demand ischemia in the setting of severe hypoxia High-sensitivity troponin elevated 65 with nonspecific ST-T changes. Denies any chest pain Follow 2D echo results: 11/22/2021. Continue to closely monitor  Elevated D-dimer Bilateral lower extremity Doppler ultrasound negative for DVT. Ordered VQ scan to rule out PE, however, patient is unable to lay flat.  Presumptive PE, high probability Elevated D-Dimer, severe hypoxia Unable to obtain CTA chest due to renal insufficiency Unable to obtain VQ scan due to unable to lay flat Start full dose SQ Lovenox BID, pharmacy to dose   Diabetes mellitus type 2 with hyperglycemia ([place]) Hemoglobin A 6.0% on 11/10/2021. Carbohydrate modified diet. Continue insulin sliding scale.    Essential hypertension BPs are soft, hold off home amlodipine. Continue atenolol 50 mg p.o. twice daily. Hold off on IV Lasix for now. Continue to monitor vital signs.     OSA on CPAP Continue CPAP/BiPAP at bedtime.     Hypothyroidism Continue Synthroid 300 mg p.o. daily.     Chronic kidney disease, stage IV (severe) (HCC) Monitor renal function electrolytes. HD has been anticipated in the next few months. Follow-up with nephrology as scheduled. Hold off IV Lasix on 11/22/2021. Monitor urine output Avoid nephrotoxic agents and hypotension.     Alzheimer's disease ([place]) dementia/chronic anxiety Resume home regimen Clonazepam as needed for anxiety.     Class 1 obesity Lifestyle  modifications as an  outpatient.  Physical debility PT OT to assess Fall precautions    Critical care time: 65 minutes.      Advance Care Planning:   Code Status: Full Code    Consults: None.   Family Communication: None at bedside.   Severity of Illness:       Status is: Inpatient Patient requires at least 2 midnights for further evaluation and treatment of present condition.    Objective: Vitals:   11/22/21 0600 11/22/21 0749 11/22/21 0956 11/22/21 1208  BP: (!) 140/39  (!) 149/35   Pulse: 61  66   Resp: (!) 26     Temp:  97.9 F (36.6 C)  (!) 97.5 F (36.4 C)  TempSrc:  Axillary  Axillary  SpO2: 100% 97%    Weight:      Height:        Intake/Output Summary (Last 24 hours) at 11/22/2021 1258 Last data filed at 11/22/2021 0400 Gross per 24 hour  Intake 761.81 ml  Output 1500 ml  Net -738.19 ml   Filed Weights   11/20/2021 1135 12/03/2021 1451  Weight: 111.6 kg 109.6 kg    Exam:  General: 81 y.o. year-old male well developed well nourished in no acute distress. Cardiovascular: Regular rate and rhythm with no rubs or gallops.  No thyromegaly or JVD noted.   Respiratory: Mild rales at bases no wheezing noted.  Poor inspiratory effort.  Abdomen: Soft nontender nondistended with normal bowel sounds x4 quadrants. Musculoskeletal: Trace lower extremity edema bilaterally. Skin: No ulcerative lesions noted or rashes, Psychiatry: Mood is appropriate for condition and setting   Data Reviewed: CBC: Recent Labs  Lab 11/16/21 0106 11/17/21 0526 11/19/2021 1050 11/22/21 0315  WBC 12.8* 13.3* 17.3* 29.8*  NEUTROABS 9.8*  --  14.2* 26.9*  HGB 10.7* 11.6* 11.6* 12.0*  HCT 32.8* 34.9* 36.0* 38.4*  MCV 90.9 89.5 91.8 93.9  PLT 225 274 416* 287   Basic Metabolic Panel: Recent Labs  Lab 11/17/2021 1050 11/22/21 0315  NA 137 142  K 3.2* 4.2  CL 103 104  CO2 26 26  GLUCOSE 285* 291*  BUN 82* 78*  CREATININE 2.21* 2.46*  CALCIUM 8.3* 9.0   GFR: Estimated Creatinine Clearance:  31.6 mL/min (A) (by C-G formula based on SCr of 2.46 mg/dL (H)). Liver Function Tests: Recent Labs  Lab 11/16/2021 1050 11/22/21 0315  AST 16 14*  ALT 16 13  ALKPHOS 57 51  BILITOT 0.2* 0.5  PROT 6.2* 6.8  ALBUMIN 2.3* 3.2*   No results for input(s): LIPASE, AMYLASE in the last 168 hours. No results for input(s): AMMONIA in the last 168 hours. Coagulation Profile: Recent Labs  Lab 11/17/2021 1050  INR 1.1   Cardiac Enzymes: No results for input(s): CKTOTAL, CKMB, CKMBINDEX, TROPONINI in the last 168 hours. BNP (last 3 results) No results for input(s): PROBNP in the last 8760 hours. HbA1C: No results for input(s): HGBA1C in the last 72 hours. CBG: Recent Labs  Lab 11/17/21 2201 12/03/2021 1811 12/02/2021 2148 11/22/21 0816 11/22/21 1208  GLUCAP 203* 251* 277* 226* 239*   Lipid Profile: No results for input(s): CHOL, HDL, LDLCALC, TRIG, CHOLHDL, LDLDIRECT in the last 72 hours. Thyroid Function Tests: Recent Labs    12/11/2021 1050  TSH 1.043   Anemia Panel: No results for input(s): VITAMINB12, FOLATE, FERRITIN, TIBC, IRON, RETICCTPCT in the last 72 hours. Urine analysis:    Component Value Date/Time   COLORURINE STRAW (A) 11/23/2021 1050   APPEARANCEUR  CLEAR 11/27/2021 1050   LABSPEC 1.009 12/08/2021 1050   PHURINE 5.0 12/01/2021 1050   GLUCOSEU 50 (A) 11/24/2021 1050   HGBUR NEGATIVE 12/11/2021 1050   BILIRUBINUR NEGATIVE 11/20/2021 1050   KETONESUR NEGATIVE 11/26/2021 1050   PROTEINUR NEGATIVE 12/05/2021 1050   UROBILINOGEN 0.2 04/13/2014 2334   NITRITE NEGATIVE 12/04/2021 1050   LEUKOCYTESUR NEGATIVE 12/04/2021 1050   Sepsis Labs: '@LABRCNTIP'$ (procalcitonin:4,lacticidven:4)  ) Recent Results (from the past 240 hour(s))  Culture, blood (Routine X 2) w Reflex to ID Panel     Status: None   Collection Time: 11/13/21  8:36 AM   Specimen: BLOOD LEFT HAND  Result Value Ref Range Status   Specimen Description BLOOD LEFT HAND  Final   Special Requests   Final     BOTTLES DRAWN AEROBIC AND ANAEROBIC Blood Culture adequate volume   Culture   Final    NO GROWTH 5 DAYS Performed at [facility], [place] [address], [place], [place] [address]    Report Status 11/18/2021 FINAL  Final  Culture, blood (Routine X 2) w Reflex to ID Panel     Status: None   Collection Time: 11/13/21  8:45 AM   Specimen: BLOOD LEFT ARM  Result Value Ref Range Status   Specimen Description BLOOD LEFT ARM  Final   Special Requests   Final    BOTTLES DRAWN AEROBIC AND ANAEROBIC Blood Culture adequate volume   Culture   Final    NO GROWTH 5 DAYS Performed at [facility], [place] [address] [place], [place] [address]    Report Status 11/18/2021 FINAL  Final  Blood Culture (routine x 2)     Status: None (Preliminary result)   Collection Time: 12/06/2021 10:50 AM   Specimen: BLOOD  Result Value Ref Range Status   Specimen Description   Final    BLOOD BLOOD LEFT WRIST Performed at [facility] [address] [place], [place] [address]    Special Requests   Final    BOTTLES DRAWN AEROBIC AND ANAEROBIC Blood Culture results may not be optimal due to an excessive volume of blood received in culture bottles Performed at [facility] [address], [place], [place] [address]    Culture   Final    NO GROWTH < 24 HOURS Performed at [facility] [address] [place], [place] [address]    Report Status PENDING  Incomplete  Resp Panel by RT-PCR (Flu A&B, Covid) Nasopharyngeal Swab     Status: None   Collection Time: 12/12/2021 10:51 AM   Specimen: Nasopharyngeal Swab; Nasopharyngeal(NP) swabs in vial transport medium  Result Value Ref Range Status   SARS Coronavirus 2 by RT PCR NEGATIVE NEGATIVE Final    Comment: (NOTE) SARS-CoV-2 target nucleic acids are NOT DETECTED.  The SARS-CoV-2 RNA is generally detectable in upper respiratory specimens during the acute phase of infection. The lowest concentration of SARS-CoV-2 viral  copies this assay can detect is 138 copies/mL. A negative result does not preclude SARS-Cov-2 infection and should not be used as the sole basis for treatment or other patient management decisions. A negative result may occur with  improper specimen collection/handling, submission of specimen other than nasopharyngeal swab, presence of viral mutation(s) within the areas targeted by this assay, and inadequate number of viral copies(<138 copies/mL). A negative result must be combined with clinical observations, patient history, and epidemiological information. The expected result is Negative.  Fact Sheet for Patients:  EntrepreneurPulse.com.au  Fact Sheet for Healthcare Providers:  IncredibleEmployment.be  This test is no t yet approved or cleared by the [place] FDA and  has been authorized for detection and/or diagnosis of SARS-CoV-2 by FDA under an Emergency Use Authorization (EUA). This EUA will remain  in effect (meaning this test can be used) for the duration of the COVID-19 declaration under Section 564(b)(1) of the Act, 21 U.S.C.section 360bbb-3(b)(1), unless the authorization is terminated  or revoked sooner.       Influenza A by PCR NEGATIVE NEGATIVE Final   Influenza B by PCR NEGATIVE NEGATIVE Final    Comment: (NOTE) The Xpert Xpress SARS-CoV-2/FLU/RSV plus assay is intended as an aid in the diagnosis of influenza from Nasopharyngeal swab specimens and should not be used as a sole basis for treatment. Nasal washings and aspirates are unacceptable for Xpert Xpress SARS-CoV-2/FLU/RSV testing.  Fact Sheet for Patients: EntrepreneurPulse.com.au  Fact Sheet for Healthcare Providers: IncredibleEmployment.be  This test is not yet approved or cleared by the [place] FDA and has been authorized for detection and/or diagnosis of SARS-CoV-2 by FDA under an Emergency Use Authorization (EUA). This  EUA will remain in effect (meaning this test can be used) for the duration of the COVID-19 declaration under Section 564(b)(1) of the Act, 21 U.S.C. section 360bbb-3(b)(1), unless the authorization is terminated or revoked.  Performed at [facility], [place] [address] [place], [place] [address]   Blood Culture (routine x 2)     Status: None (Preliminary result)   Collection Time: 12/05/2021 10:55 AM   Specimen: BLOOD  Result Value Ref Range Status   Specimen Description   Final    BLOOD LEFT ANTECUBITAL Performed at [facility] [address], [place], [place] [address]    Special Requests   Final    BOTTLES DRAWN AEROBIC AND ANAEROBIC Blood Culture results may not be optimal due to an excessive volume of blood received in culture bottles Performed at [facility] [address] [place], [place] [address]    Culture   Final    NO GROWTH < 24 HOURS Performed at [facility] [address], [place], [place] [address]    Report Status PENDING  Incomplete  MRSA Next Gen by PCR, Nasal     Status: None   Collection Time: 11/27/2021  2:05 PM   Specimen: Nasal Mucosa; Nasal Swab  Result Value Ref Range Status   MRSA by PCR Next Gen NOT DETECTED NOT DETECTED Final    Comment: (NOTE) The GeneXpert MRSA Assay (FDA approved for NASAL specimens only), is one component of a comprehensive MRSA colonization surveillance program. It is not intended to diagnose MRSA infection nor to guide or monitor treatment for MRSA infections. Test performance is not FDA approved in patients less than 57 years old. Performed at [facility], [place] [address] [place], [place] [address]       Studies: DG Swallowing Func-Speech Pathology  Result Date: 11/22/2021 Table formatting from the original result was not included. Objective Swallowing Evaluation: Type of Study: Bedside Swallow Evaluation  Patient Details Name: Daniel Ochoa MRN: [id number] Date of Birth: Apr 21, 1941 Today's Date: 11/22/2021 Time: SLP Start Time (ACUTE ONLY): 1015 -SLP Stop Time (ACUTE ONLY): 1045 SLP Time Calculation (min) (ACUTE ONLY): 30 min Past Medical History: Past Medical History: Diagnosis Date  Acute deep vein thrombosis (DVT) of tibial vein of right lower extremity ([place]) 04/13/2014  Acute pulmonary embolism ([place]) 04/15/2014  Arthritis   Chronic fatigue   Chronic kidney disease, stage IV (severe) (HCC)   Complication of anesthesia   one surgery took 4 people to hold him down 2000  Constipation   Diabetic peripheral neuropathy ([place]) 10/26/2015  DM (diabetes mellitus), type 2, uncontrolled, with renal complications   [facility]  DVT (deep venous thrombosis) ([place])   RLE DVT with intermediate risk VQ scan for PE 03/2014  Dyspnea   Essential hypertension   Fibromyalgia   Hypothyroidism   Hypoxia 04/13/2014  Neuropathy   Obesity   OSA on CPAP   Pulmonary embolism ([place]) 04/13/2014  VQ 04/13/14 intermediate assoc with R DVT > on coumadin since Echo 04/14/14 > PA peak pressure: 89 mm Hg (S).  Past Surgical History: Past Surgical History: Procedure Laterality Date  AV FISTULA PLACEMENT Left 04/18/2017  Procedure: ARTERIOVENOUS (AV) FISTULA CREATION;  Surgeon: Angelia Mould, MD;  Location: [facility];  Service: Vascular;  Laterality: Left;  [place] Right 11/14/2020  Procedure: BASCILIC VEIN TRANSPOSITION 1ST STAGE RIGHT;  Surgeon: Angelia Mould, MD;  Location: [facility];  Service: Vascular;  Laterality: Right;  [place] Right 01/05/2021  Procedure: RIGHT SECOND STAGE [place];  Surgeon: Serafina Mitchell, MD;  Location: [facility] OR;  Service: Vascular;  Laterality: Right;  CERVICAL LAMINECTOMY    LIGATION OF ARTERIOVENOUS  FISTULA Left 06/01/2021  Procedure: LIGATION OF LEFT ARM FISTULA;  Surgeon: Serafina Mitchell, MD;  Location: [facility] OR;  Service: Vascular;  Laterality: Left;  LUMBAR FUSION    x 2  ROTATOR CUFF  REPAIR Right   THYROIDECTOMY, PARTIAL   HPI: 81 y/o male presented to ED from SNF (after having been at SNF only few days) with chest congestion, cough and found to have pna.  Pt recently discharged from [facility] after admission for concern for TIA = due to 2 days of slurred speech and weakness. Unable to obtain MRI due to kyphosis per notes. CT head unremarkable. Patient with recent UTI x 2-3 weeks ago.  PMH: DM type 2, Alzheimer's, Afib, CKD stage IV, DVT, unspecified skin cancer, constipation, peripheral neuropathy, chronic fatigue, fibromyalgia, OEA.  Swallow evaluation conducted during prior admit without evidence of dysphagia and regular/thin diet was advised.  CXR showed increased densities in left hilum and left lower chest.   Wife admits pt has been coughing on occasion with liquids more than foods over the last four months.  She states pt mostly slept at home prior to SNF admit due to his medical diagnoses. He remains a full code at this time.  Subjective: pt awake in flouro chair, RN present with pt  Recommendations for follow up therapy are one component of a multi-disciplinary discharge planning process, led by the attending physician.  Recommendations may be updated based on patient status, additional functional criteria and insurance authorization. Assessment / Plan / Recommendation Clinical Impressions 11/22/2021 Clinical Impression Patient presents with moderately severe oropharyngeal dysphagia with sensorimotor deficits.  Impaired oral transiting/bolus propulsion noted due to weakness.  Pharyngeal swallow trigger variable - with thin and nectar liquids at pyriform sinus which allowed aspiration during the swallow.  2nd bolus of pudding *later in the study* triggered at vallecular space at 2 seconds and cracker triggered at 5 seconds. Pt impaired pharyngeal motility -allows vallecular and pyriform sinus retention, that mixed with secretions.  Cues to swallow not consistently effective - however  when cued to throat clear or cough, pt produced cough and reflexively swallowed. He however does  not fully clear retention in pharynx.   Significant aspiration with thin and nectar via cup/straw noted - with cough *likely when aspirates reached the carina* but cough was not effective to clear.  Postures not tested due to patients excessive limited head ROM.    Pt is elevated aspiration and malnutrition risk due to his level of dysphagia. Currently - recommend consider palliative consult to establish [place] given pts recent hospital stay, progressive neuro diagnosis and deconditioning.  At this time, recommend consider CREAMY puree and nectar liquid diet with strict precautions to mitigate aspiration.  Recommend pt be allowed ice chips and tsps of thin water any time.  He only coughed during the MBS when he aspirated - thus if he is coughing with intake, he is definitively aspirating.  Frequent small meals may help maximize nutrition with least risk. Skilled SLP included determining effective compensation strategies and pt education.  Recommended pt work on strengthening his cough and hock expectoration strength for maximal airway protection. Per prior clinical swallow evaluation, pt's swallow function currently appears significantly worse - ? source? SLP Visit Diagnosis Dysphagia, oropharyngeal phase (R13.12) Attention and concentration deficit following -- Frontal lobe and executive function deficit following -- Impact on safety and function Moderate aspiration risk;Risk for inadequate nutrition/hydration   Treatment Recommendations 11/22/2021 Treatment Recommendations Therapy as outlined in treatment plan below   Prognosis 11/22/2021 Prognosis for Safe Diet Advancement Guarded Barriers to Reach Goals Severity of deficits;Cognitive deficits;Other (Comment) Barriers/Prognosis Comment -- Diet Recommendations 11/22/2021 SLP Diet Recommendations Dysphagia 1 (Puree) solids;Nectar thick liquid;Ice chips PRN after oral care  Liquid Administration via [place];No straw Medication Administration -- Compensations Slow rate;Small sips/bites;Other (Comment) Postural Changes --   Other Recommendations 11/22/2021 Recommended Consults -- Oral Care Recommendations Oral care QID Other Recommendations Order thickener from pharmacy Follow Up Recommendations Skilled nursing-short term rehab (<3 hours/day) Assistance recommended at discharge Frequent or constant Supervision/Assistance Functional Status Assessment Patient has had a recent decline in their functional status and demonstrates the ability to make significant improvements in function in a reasonable and predictable amount of time. Frequency and Duration  11/22/2021 Speech Therapy Frequency (ACUTE ONLY) min 2x/week Treatment Duration 1 week   Oral Phase 11/22/2021 Oral Phase Impaired Oral - Pudding Teaspoon -- Oral - Pudding Cup -- Oral - Honey Teaspoon Weak lingual manipulation;Reduced posterior propulsion Oral - Honey Cup -- Oral - Nectar Teaspoon Weak lingual manipulation;Reduced posterior propulsion Oral - Nectar Cup Weak lingual manipulation;Reduced posterior propulsion Oral - Nectar Straw Weak lingual manipulation;Reduced posterior propulsion Oral - Thin Teaspoon Weak lingual manipulation;Reduced posterior propulsion Oral - Thin Cup Weak lingual manipulation;Reduced posterior propulsion Oral - Thin Straw Weak lingual manipulation;Reduced posterior propulsion Oral - Puree Weak lingual manipulation;Reduced posterior propulsion Oral - Mech Soft Weak lingual manipulation;Impaired mastication;Reduced posterior propulsion Oral - Regular -- Oral - Multi-Consistency -- Oral - Pill -- Oral Phase - Comment --  Pharyngeal Phase 11/22/2021 Pharyngeal Phase Impaired Pharyngeal- Pudding Teaspoon -- Pharyngeal -- Pharyngeal- Pudding Cup -- Pharyngeal -- Pharyngeal- Honey Teaspoon -- Pharyngeal -- Pharyngeal- Honey Cup -- Pharyngeal -- Pharyngeal- Nectar Teaspoon Reduced pharyngeal peristalsis;Pharyngeal  residue - valleculae;Pharyngeal residue - pyriform;Delayed swallow initiation-pyriform sinuses Pharyngeal Material does not enter airway Pharyngeal- Nectar Cup Delayed swallow initiation-pyriform sinuses;Reduced pharyngeal peristalsis;Pharyngeal residue - valleculae;Pharyngeal residue - pyriform;Penetration/Aspiration during swallow;Significant aspiration (Amount) Pharyngeal Material enters airway, passes BELOW cords without attempt by patient to eject out (silent aspiration) Pharyngeal- Nectar Straw -- Pharyngeal -- Pharyngeal- Thin Teaspoon Delayed swallow initiation-pyriform sinuses;Reduced pharyngeal peristalsis;Pharyngeal residue - valleculae;Pharyngeal residue -  pyriform Pharyngeal Material does not enter airway Pharyngeal- Thin Cup Delayed swallow initiation-pyriform sinuses;Reduced pharyngeal peristalsis;Reduced laryngeal elevation;Reduced airway/laryngeal closure Pharyngeal Material enters airway, passes BELOW cords without attempt by patient to eject out (silent aspiration) Pharyngeal- Thin Straw Delayed swallow initiation-pyriform sinuses;Reduced pharyngeal peristalsis;Reduced laryngeal elevation;Reduced airway/laryngeal closure;Significant aspiration (Amount) Pharyngeal Material enters airway, passes BELOW cords without attempt by patient to eject out (silent aspiration);Material enters airway, passes BELOW cords and not ejected out despite cough attempt by patient Pharyngeal- Puree Pharyngeal residue - valleculae;Reduced pharyngeal peristalsis;Reduced laryngeal elevation;Reduced airway/laryngeal closure Pharyngeal Material does not enter airway Pharyngeal- Mechanical Soft Pharyngeal residue - valleculae;Reduced tongue base retraction;Reduced pharyngeal peristalsis;Reduced laryngeal elevation;Reduced airway/laryngeal closure Pharyngeal Material does not enter airway Pharyngeal- Regular -- Pharyngeal -- Pharyngeal- Multi-consistency -- Pharyngeal -- Pharyngeal- Pill -- Pharyngeal -- Pharyngeal Comment --   Cervical Esophageal Phase  11/22/2021 Cervical Esophageal Phase Impaired Pudding Teaspoon -- Pudding Cup -- Honey Teaspoon -- Honey Cup -- Nectar Teaspoon -- Nectar Cup -- Nectar Straw -- Thin Teaspoon -- Thin Cup -- Thin Straw -- Puree -- Mechanical Soft -- Regular -- Multi-consistency -- Pill -- Cervical Esophageal Comment Upon esophageal sweep, pt appeared with retention and retrograde propulsion without awareness.  MBS does not diagnosis esophageal dysphagia. Kathleen Lime, MS [facility] SLP Acute Rehab Services Office [phone] Pager [phone] Daniel Ochoa 11/22/2021, 11:46 AM                     VAS [place] LOWER EXTREMITY VENOUS (DVT)  Result Date: 11/22/2021  Lower Venous DVT Study Patient Name:  Daniel Ochoa  Date of Exam:   11/22/2021 Medical Rec #: [id number]            Accession #:    [id number] Date of Birth: 1941/03/11           Patient Gender: M Patient Age:   36 years Exam Location:  [facility] Procedure:      VAS [place] LOWER EXTREMITY VENOUS (DVT) Referring Phys: Irene Pap --------------------------------------------------------------------------------  Indications: Pain.  Risk Factors: None identified. Limitations: Poor ultrasound/tissue interface, patient positioning and body habitus. Comparison Study: No prior studies. Performing Technologist: Oliver Hum RVT  Examination Guidelines: A complete evaluation includes B-mode imaging, spectral Doppler, color Doppler, and power Doppler as needed of all accessible portions of each vessel. Bilateral testing is considered an integral part of a complete examination. Limited examinations for reoccurring indications may be performed as noted. The reflux portion of the exam is performed with the patient in reverse Trendelenburg.  +---------+---------------+---------+-----------+----------+--------------+  RIGHT     Compressibility Phasicity Spontaneity Properties Thrombus Aging   +---------+---------------+---------+-----------+----------+--------------+  CFV       Full            Yes       Yes                                    +---------+---------------+---------+-----------+----------+--------------+  SFJ       Full                                                             +---------+---------------+---------+-----------+----------+--------------+  FV Prox   Full                                                             +---------+---------------+---------+-----------+----------+--------------+  FV Mid    Full                                                             +---------+---------------+---------+-----------+----------+--------------+  FV Distal Full                                                             +---------+---------------+---------+-----------+----------+--------------+  PFV       Full                                                             +---------+---------------+---------+-----------+----------+--------------+  POP       Full            Yes       Yes                                    +---------+---------------+---------+-----------+----------+--------------+  PTV       Full                                                             +---------+---------------+---------+-----------+----------+--------------+  PERO      Full                                                             +---------+---------------+---------+-----------+----------+--------------+   +---------+---------------+---------+-----------+----------+--------------+  LEFT      Compressibility Phasicity Spontaneity Properties Thrombus Aging  +---------+---------------+---------+-----------+----------+--------------+  CFV       Full            Yes       Yes                                    +---------+---------------+---------+-----------+----------+--------------+  SFJ       Full                                                              +---------+---------------+---------+-----------+----------+--------------+  FV Prox   Full                                                             +---------+---------------+---------+-----------+----------+--------------+  FV Mid    Full                                                             +---------+---------------+---------+-----------+----------+--------------+  FV Distal Full                                                             +---------+---------------+---------+-----------+----------+--------------+  PFV       Full                                                             +---------+---------------+---------+-----------+----------+--------------+  POP       Full            Yes       Yes                                    +---------+---------------+---------+-----------+----------+--------------+  PTV       Full                                                             +---------+---------------+---------+-----------+----------+--------------+  PERO      Full                                                             +---------+---------------+---------+-----------+----------+--------------+    Summary: RIGHT: - There is no evidence of deep vein thrombosis in the lower extremity. However, portions of this examination were limited- see technologist comments above.  - No cystic structure found in the popliteal fossa.  LEFT: - There is no evidence of deep vein thrombosis in the lower extremity. However, portions of this examination were limited- see technologist comments above.  - No cystic structure found in the popliteal fossa.  *See table(s) above for measurements and observations.    Preliminary     Scheduled Meds:  albuterol  2.5 mg Nebulization TID   vitamin C  500 mg Oral Daily   atenolol  50 mg Oral BID   chlorhexidine  15 mL Mouth Rinse BID   Chlorhexidine Gluconate Cloth  6 each Topical Daily   enoxaparin (LOVENOX) injection  40 mg Subcutaneous Q24H   famotidine  40 mg  Oral QHS   insulin aspart  0-9 Units Subcutaneous TID WC   levothyroxine  200 mcg Oral Once per day on Mon Tue Wed Thu Fri Sat   [START ON 11/26/2021] levothyroxine  300 mcg Oral Once per day  on Sun   mouth rinse  15 mL Mouth Rinse BID    Continuous Infusions:  sodium chloride 10 mL/hr at 11/22/21 1010   meropenem (MERREM) IV Stopped (11/22/21 1042)     LOS: 1 day     Kayleen Memos, MD [facility] Pager [phone]  If 7PM-7AM, please contact night-coverage [email or web address] Password [facility] 11/22/2021, 12:58 PM

## 2021-11-22 NOTE — Progress Notes (Signed)
ANTICOAGULATION CONSULT NOTE - Initial Consult ? ?Pharmacy Consult for Lovenox ?Indication: Suspected PE ? ?Allergies  ?Allergen Reactions  ? Statins Other (See Comments)  ?  Elevated CK level  ? Lisinopril   ?  Other reaction(s): stopped by pulmonology  to help with breathing  ? Latex Rash  ?  Severe blistery rash  ? ? ?Patient Measurements: ?Height: '6\' 1"'$  (185.4 cm) ?Weight: 109.6 kg (241 lb 10 oz) ?IBW/kg (Calculated) : 79.9 ?Heparin Dosing Weight:  ? ?Vital Signs: ?Temp: 97.5 ?F (36.4 ?C) (03/08 1208) ?Temp Source: Axillary (03/08 1208) ?BP: 149/35 (03/08 0956) ?Pulse Rate: 66 (03/08 0956) ? ?Labs: ?Recent Labs  ?  11/23/2021 ?1050 12/06/2021 ?1742 11/22/21 ?0315  ?HGB 11.6*  --  12.0*  ?HCT 36.0*  --  38.4*  ?PLT 416*  --  397  ?APTT 27  --   --   ?LABPROT 14.3  --   --   ?INR 1.1  --   --   ?CREATININE 2.21*  --  2.46*  ?TROPONINIHS 41* 65*  --   ? ? ?Estimated Creatinine Clearance: 31.6 mL/min (A) (by C-G formula based on SCr of 2.46 mg/dL (H)). ? ? ?Medical History: ?Past Medical History:  ?Diagnosis Date  ? Acute deep vein thrombosis (DVT) of tibial vein of right lower extremity ([place]) 04/13/2014  ? Acute pulmonary embolism ([place]) 04/15/2014  ? Arthritis   ? Chronic fatigue   ? Chronic kidney disease, stage IV (severe) ([place])   ? Complication of anesthesia   ? one surgery took 4 people to hold him down 2000  ? Constipation   ? Diabetic peripheral neuropathy ([place]) 10/26/2015  ? DM (diabetes mellitus), type 2, uncontrolled, with renal complications   ? [facility]  ? DVT (deep venous thrombosis) ([place])   ? RLE DVT with intermediate risk VQ scan for PE 03/2014  ? Dyspnea   ? Essential hypertension   ? Fibromyalgia   ? Hypothyroidism   ? Hypoxia 04/13/2014  ? Neuropathy   ? Obesity   ? OSA on CPAP   ? Pulmonary embolism ([place]) 04/13/2014  ? VQ 04/13/14 intermediate assoc with R DVT > on coumadin since Echo 04/14/14 > PA peak pressure: 89 mm Hg (S).   ? ? ?Medications:  ?Scheduled:  ? albuterol  2.5 mg Nebulization TID  ?  vitamin C  500 mg Oral Daily  ? atenolol  50 mg Oral BID  ? chlorhexidine  15 mL Mouth Rinse BID  ? Chlorhexidine Gluconate Cloth  6 each Topical Daily  ? enoxaparin (LOVENOX) injection  40 mg Subcutaneous Q24H  ? famotidine  40 mg Oral QHS  ? insulin aspart  0-9 Units Subcutaneous TID WC  ? levothyroxine  200 mcg Oral Once per day on Mon Tue Wed Thu Fri Sat  ? [START ON 11/26/2021] levothyroxine  300 mcg Oral Once per day on Sun  ? mouth rinse  15 mL Mouth Rinse BID  ? ?Infusions:  ? sodium chloride 10 mL/hr at 11/22/21 1010  ? meropenem (MERREM) IV Stopped (11/22/21 1042)  ? ? ?Assessment: ?73 yoM presented on 3/7 with SOB, HCAP, concern for aspiration.  PMH of anticoagulation for history of DVT/PE, but no longer taking any oral anticoagulation.  He was started on Lovenox '40mg'$  daily for VTE prophylaxis on admission.  Pharmacy is now consulted to dose Lovenox for possible PE.   ? ?Today, 11/22/2021:  ?Ddimer 4.7 ?Dopplers:  No DVT, limited exam ?CBC: Hgb low/stable at 12, Plt WNL ?SCr  increased to 2.46, CrCl ~ 31 ml/min.  (Baseline CKD-IV) ? ?Goal of Therapy:  ?Anti-Xa level 0.6-1 units/ml 4hrs after LMWH dose given ?Monitor platelets by anticoagulation protocol: Yes ?  ?Plan:  ?Lovenox 1 mg/kg (110 mg) SQ Q12h ?Follow up renal function, testing, and CBC ? ? ?Gretta Arab PharmD, BCPS ?Clinical Pharmacist ?[facility] [phone] ?11/22/2021 1:00 PM ? ? ?

## 2021-11-23 ENCOUNTER — Inpatient Hospital Stay (HOSPITAL_COMMUNITY): Payer: Medicare Other

## 2021-11-23 ENCOUNTER — Encounter (HOSPITAL_COMMUNITY): Payer: Self-pay | Admitting: Cardiology

## 2021-11-23 DIAGNOSIS — F0280 Dementia in other diseases classified elsewhere without behavioral disturbance: Secondary | ICD-10-CM | POA: Diagnosis not present

## 2021-11-23 DIAGNOSIS — R609 Edema, unspecified: Secondary | ICD-10-CM

## 2021-11-23 DIAGNOSIS — J9601 Acute respiratory failure with hypoxia: Secondary | ICD-10-CM | POA: Diagnosis not present

## 2021-11-23 DIAGNOSIS — G309 Alzheimer's disease, unspecified: Secondary | ICD-10-CM

## 2021-11-23 DIAGNOSIS — N184 Chronic kidney disease, stage 4 (severe): Secondary | ICD-10-CM | POA: Diagnosis not present

## 2021-11-23 DIAGNOSIS — J189 Pneumonia, unspecified organism: Secondary | ICD-10-CM

## 2021-11-23 DIAGNOSIS — Z7189 Other specified counseling: Secondary | ICD-10-CM

## 2021-11-23 DIAGNOSIS — R1310 Dysphagia, unspecified: Secondary | ICD-10-CM

## 2021-11-23 DIAGNOSIS — Z515 Encounter for palliative care: Secondary | ICD-10-CM

## 2021-11-23 LAB — CBC WITH DIFFERENTIAL/PLATELET
Abs Immature Granulocytes: 0.2 10*3/uL — ABNORMAL HIGH (ref 0.00–0.07)
Basophils Absolute: 0.1 10*3/uL (ref 0.0–0.1)
Basophils Relative: 0 %
Eosinophils Absolute: 0.6 10*3/uL — ABNORMAL HIGH (ref 0.0–0.5)
Eosinophils Relative: 3 %
HCT: 33 % — ABNORMAL LOW (ref 39.0–52.0)
Hemoglobin: 10.4 g/dL — ABNORMAL LOW (ref 13.0–17.0)
Immature Granulocytes: 1 %
Lymphocytes Relative: 6 %
Lymphs Abs: 1.2 10*3/uL (ref 0.7–4.0)
MCH: 29.2 pg (ref 26.0–34.0)
MCHC: 31.5 g/dL (ref 30.0–36.0)
MCV: 92.7 fL (ref 80.0–100.0)
Monocytes Absolute: 1.4 10*3/uL — ABNORMAL HIGH (ref 0.1–1.0)
Monocytes Relative: 7 %
Neutro Abs: 17.4 10*3/uL — ABNORMAL HIGH (ref 1.7–7.7)
Neutrophils Relative %: 83 %
Platelets: 404 10*3/uL — ABNORMAL HIGH (ref 150–400)
RBC: 3.56 MIL/uL — ABNORMAL LOW (ref 4.22–5.81)
RDW: 14.1 % (ref 11.5–15.5)
WBC: 20.7 10*3/uL — ABNORMAL HIGH (ref 4.0–10.5)
nRBC: 0 % (ref 0.0–0.2)

## 2021-11-23 LAB — COMPREHENSIVE METABOLIC PANEL
ALT: 11 U/L (ref 0–44)
AST: 13 U/L — ABNORMAL LOW (ref 15–41)
Albumin: 2.8 g/dL — ABNORMAL LOW (ref 3.5–5.0)
Alkaline Phosphatase: 53 U/L (ref 38–126)
Anion gap: 10 (ref 5–15)
BUN: 91 mg/dL — ABNORMAL HIGH (ref 8–23)
CO2: 26 mmol/L (ref 22–32)
Calcium: 8.8 mg/dL — ABNORMAL LOW (ref 8.9–10.3)
Chloride: 107 mmol/L (ref 98–111)
Creatinine, Ser: 2.65 mg/dL — ABNORMAL HIGH (ref 0.61–1.24)
GFR, Estimated: 24 mL/min — ABNORMAL LOW (ref >60)
Glucose, Bld: 214 mg/dL — ABNORMAL HIGH (ref 70–99)
Potassium: 3.6 mmol/L (ref 3.5–5.1)
Sodium: 143 mmol/L (ref 135–145)
Total Bilirubin: 0.3 mg/dL (ref 0.3–1.2)
Total Protein: 6.4 g/dL — ABNORMAL LOW (ref 6.5–8.1)

## 2021-11-23 LAB — BLOOD GAS, ARTERIAL
Acid-Base Excess: 5.4 mmol/L — ABNORMAL HIGH (ref 0.0–2.0)
Bicarbonate: 29.9 mmol/L — ABNORMAL HIGH (ref 20.0–28.0)
Drawn by: 25788
FIO2: 60 %
O2 Content: 10 L/min
O2 Saturation: 90.2 %
Patient temperature: 36.7
pCO2 arterial: 41 mmHg (ref 32–48)
pH, Arterial: 7.47 — ABNORMAL HIGH (ref 7.35–7.45)
pO2, Arterial: 51 mmHg — ABNORMAL LOW (ref 83–108)

## 2021-11-23 LAB — D-DIMER, QUANTITATIVE: D-Dimer, Quant: 6.18 ug/mL-FEU — ABNORMAL HIGH (ref 0.00–0.50)

## 2021-11-23 LAB — GLUCOSE, CAPILLARY
Glucose-Capillary: 236 mg/dL — ABNORMAL HIGH (ref 70–99)
Glucose-Capillary: 243 mg/dL — ABNORMAL HIGH (ref 70–99)
Glucose-Capillary: 271 mg/dL — ABNORMAL HIGH (ref 70–99)
Glucose-Capillary: 286 mg/dL — ABNORMAL HIGH (ref 70–99)

## 2021-11-23 LAB — PHOSPHORUS: Phosphorus: 4.6 mg/dL (ref 2.5–4.6)

## 2021-11-23 LAB — MAGNESIUM: Magnesium: 2.4 mg/dL (ref 1.7–2.4)

## 2021-11-23 LAB — PROCALCITONIN: Procalcitonin: 0.51 ng/mL

## 2021-11-23 MED ORDER — NEPRO/CARBSTEADY PO LIQD
237.0000 mL | Freq: Two times a day (BID) | ORAL | Status: DC
  Administered 2021-11-24: 237 mL via ORAL
  Filled 2021-11-23 (×5): qty 237

## 2021-11-23 MED ORDER — FAMOTIDINE 20 MG PO TABS
10.0000 mg | ORAL_TABLET | Freq: Every day | ORAL | Status: DC
  Administered 2021-11-24: 10 mg via ORAL
  Filled 2021-11-23: qty 1

## 2021-11-23 MED ORDER — ATENOLOL 25 MG PO TABS
12.5000 mg | ORAL_TABLET | ORAL | Status: AC
  Administered 2021-11-23: 18:00:00 12.5 mg via ORAL
  Filled 2021-11-23: qty 1

## 2021-11-23 MED ORDER — ATENOLOL 25 MG PO TABS: 12.5000 mg | ORAL_TABLET | ORAL | Status: DC

## 2021-11-23 MED ORDER — ATENOLOL 25 MG PO TABS: 12.5000 mg | ORAL_TABLET | Freq: Two times a day (BID) | ORAL | Status: DC

## 2021-11-23 MED ORDER — ENOXAPARIN SODIUM 120 MG/0.8ML IJ SOSY: 110.0000 mg | PREFILLED_SYRINGE | INTRAMUSCULAR | Status: DC

## 2021-11-23 MED ORDER — AMLODIPINE BESYLATE 5 MG PO TABS
5.0000 mg | ORAL_TABLET | ORAL | Status: AC
  Administered 2021-11-23: 17:00:00 5 mg via ORAL
  Filled 2021-11-23: qty 1

## 2021-11-23 MED ORDER — AMLODIPINE BESYLATE 10 MG PO TABS
10.0000 mg | ORAL_TABLET | Freq: Every day | ORAL | Status: DC
  Administered 2021-11-24: 10 mg via ORAL
  Filled 2021-11-23: qty 1

## 2021-11-23 MED ORDER — HEPARIN (PORCINE) 25000 UT/250ML-% IV SOLN
1650.0000 [IU]/h | INTRAVENOUS | Status: DC
  Administered 2021-11-24: 1650 [IU]/h via INTRAVENOUS
  Filled 2021-11-23: qty 250

## 2021-11-23 MED ORDER — ATENOLOL 25 MG PO TABS
25.0000 mg | ORAL_TABLET | Freq: Two times a day (BID) | ORAL | Status: DC
Start: 2021-11-24 — End: 2021-11-23

## 2021-11-23 MED ORDER — AMLODIPINE BESYLATE 5 MG PO TABS: 5.0000 mg | ORAL_TABLET | Freq: Every day | ORAL | Status: DC

## 2021-11-23 MED ORDER — AMLODIPINE BESYLATE 5 MG PO TABS
5.0000 mg | ORAL_TABLET | ORAL | Status: AC
  Administered 2021-11-23: 11:00:00 5 mg via ORAL
  Filled 2021-11-23: qty 1

## 2021-11-23 MED ORDER — RENA-VITE PO TABS
1.0000 | ORAL_TABLET | Freq: Every day | ORAL | Status: DC
  Administered 2021-11-24: 1 via ORAL
  Filled 2021-11-23 (×2): qty 1

## 2021-11-23 MED ORDER — GLUCERNA SHAKE PO LIQD
237.0000 mL | Freq: Two times a day (BID) | ORAL | Status: DC
  Filled 2021-11-23: qty 237

## 2021-11-23 MED ORDER — ATENOLOL 25 MG PO TABS
12.5000 mg | ORAL_TABLET | Freq: Two times a day (BID) | ORAL | Status: DC
Start: 2021-11-23 — End: 2021-11-23

## 2021-11-23 MED ORDER — ADULT MULTIVITAMIN W/MINERALS CH: 1.0000 | ORAL_TABLET | Freq: Every day | ORAL | Status: DC

## 2021-11-23 MED ORDER — HYDRALAZINE HCL 20 MG/ML IJ SOLN
5.0000 mg | Freq: Three times a day (TID) | INTRAMUSCULAR | Status: DC | PRN
  Administered 2021-11-24 – 2021-11-25 (×2): 5 mg via INTRAVENOUS
  Filled 2021-11-23 (×2): qty 1

## 2021-11-23 MED ORDER — FUROSEMIDE 10 MG/ML IJ SOLN
80.0000 mg | Freq: Once | INTRAMUSCULAR | Status: AC
  Administered 2021-11-23: 20:00:00 80 mg via INTRAVENOUS
  Filled 2021-11-23: qty 8

## 2021-11-23 NOTE — Evaluation (Signed)
Physical Therapy Evaluation ?Patient Details ?Name: Daniel Ochoa ?MRN: [id number] ?DOB: 1941-08-21 ?Today's Date: 11/23/2021 ? ?History of Present Illness ? Daniel Ochoa is a 81 y.o. male with medical history significant of DVT, pulmonary embolism, osteoarthritis, chronic fatigue, stage IV chronic kidney disease, type 2 diabetes, diabetic peripheral neuropathy, constipation, hypertension, fibromyalgia, hypothyroid, obesity, OSA on CPAP, dementia, unspecified skin cancer who  was recently discharged from the hospital due to TIA, E. coli UTI RUE edema and is returning to the emergency department 11/23/2021 from [facility] due to shortness of breath with hypoxia associated with weakness, productive cough.CXR on 3/9 showed right upper lung PNA.  ?Clinical Impression ? The patient is profoundly weak in all extremities. Patient leans to right in bed, unable to assist self with repositioning. Patient will benefit from return to SNF for rehab.   ?Patient's SPO2 85%-94% on 10 L HFNC  BP 179/49. ?Pt admitted with above diagnosis.  Pt currently with functional limitations due to the deficits listed below (see PT Problem List). Pt will benefit from skilled PT to increase their independence and safety with mobility to allow discharge to the venue listed below.   ?   ?   ? ?Recommendations for follow up therapy are one component of a multi-disciplinary discharge planning process, led by the attending physician.  Recommendations may be updated based on patient status, additional functional criteria and insurance authorization. ? ?Follow Up Recommendations Skilled nursing-short term rehab (<3 hours/day) ? ?  ?Assistance Recommended at Discharge Frequent or constant Supervision/Assistance  ?Patient can return home with the following ? Two people to help with walking and/or transfers;Two people to help with bathing/dressing/bathroom;Assistance with cooking/housework;Assistance with feeding;Direct supervision/assist for  medications management;Direct supervision/assist for financial management;Assist for transportation;Help with stairs or ramp for entrance ? ?  ?Equipment Recommendations None recommended by PT  ?Recommendations for Other Services ?    ?  ?Functional Status Assessment Patient has had a recent decline in their functional status and demonstrates the ability to make significant improvements in function in a reasonable and predictable amount of time.  ? ?  ?Precautions / Restrictions Precautions ?Precautions: Fall ?Precaution Comments: on 10 HFNC ?Restrictions ?Weight Bearing Restrictions: No  ? ?  ? ?Mobility ? Bed Mobility ?  ?  ?  ?  ?  ?  ?  ?General bed mobility comments: placed in bed chair position, patient listing to the right,, patient unable to assist in sitting forward in the bed ?  ? ?Transfers ?  ?  ?  ?  ?  ?  ?  ?  ?  ?General transfer comment: will need mechanical lift ?  ? ?Ambulation/Gait ?  ?  ?  ?  ?  ?  ?  ?  ? ?Stairs ?  ?  ?  ?  ?  ? ?Wheelchair Mobility ?  ? ?Modified Rankin (Stroke Patients Only) ?  ? ?  ? ?Balance   ?  ?Sitting balance-Leahy Scale: Zero ?Sitting balance - Comments: right listing in bed ?  ?  ?  ?  ?  ?  ?  ?  ?  ?  ?  ?  ?  ?  ?  ?   ? ? ? ?Pertinent Vitals/Pain Pain Assessment ?Faces Pain Scale: No hurt  ? ? ?Home Living Family/patient expects to be discharged to:: Skilled nursing facility ?  ?  ?  ?  ?  ?  ?  ?  ?  ?   ?  ?  Prior Function   ?  ?  ?  ?  ?  ?  ?Mobility Comments: limited to sitting at SNF per wife, has not stood since 11/07/21 ?  ?  ? ? ?Hand Dominance  ?   ? ?  ?Extremity/Trunk Assessment  ? Upper Extremity Assessment ?Upper Extremity Assessment: Defer to OT evaluation ?  ? ?Lower Extremity Assessment ?Lower Extremity Assessment:  (grossly 2/5 effort with  strength) ?  ? ?Cervical / Trunk Assessment ?Cervical / Trunk Assessment: Kyphotic  ?Communication  ? Communication:  (difficulty getting words clear, dry mouth)  ?Cognition Arousal/Alertness: Lethargic ?   ?Overall Cognitive Status: History of cognitive impairments - at baseline ?Area of Impairment: Problem solving, Orientation, Following commands ?  ?  ?  ?  ?  ?  ?  ?  ?Orientation Level: Time, Situation ?  ?  ?Following Commands: Follows one step commands inconsistently, Follows one step commands with increased time ?  ?  ?Problem Solving: Slow processing, Decreased initiation, Difficulty sequencing, Requires verbal cues, Requires tactile cues ?General Comments: slow to process and move ?  ?  ? ?  ?General Comments   ? ?  ?Exercises    ? ?Assessment/Plan  ?  ?PT Assessment Patient needs continued PT services  ?PT Problem List Decreased strength;Decreased activity tolerance;Decreased balance;Decreased mobility;Decreased coordination;Decreased cognition;Decreased knowledge of use of DME;Decreased knowledge of precautions;Decreased safety awareness ? ?   ?  ?PT Treatment Interventions Therapeutic activities;Cognitive remediation;Therapeutic exercise;Patient/family education;Balance training;Functional mobility training   ? ?PT Goals (Current goals can be found in the Care Plan section)  ?Acute Rehab PT Goals ?Patient Stated Goal: get up into chair ?PT Goal Formulation: With family ?Time For Goal Achievement: 12/07/21 ?Potential to Achieve Goals: Fair ? ?  ?Frequency Min 2X/week ?  ? ? ?Co-evaluation   ?  ?  ?  ?  ? ? ?  ?AM-PAC PT "6 Clicks" Mobility  ?Outcome Measure Help needed turning from your back to your side while in a flat bed without using bedrails?: Total ?Help needed moving from lying on your back to sitting on the side of a flat bed without using bedrails?: Total ?Help needed moving to and from a bed to a chair (including a wheelchair)?: Total ?Help needed standing up from a chair using your arms (e.g., wheelchair or bedside chair)?: Total ?Help needed to walk in hospital room?: Total ?Help needed climbing 3-5 steps with a railing? : Total ?6 Click Score: 6 ? ?  ?End of Session   ?Activity Tolerance:  Patient limited by lethargy ?Patient left: in bed;with call bell/phone within reach;with family/visitor present ?Nurse Communication: Mobility status;Need for lift equipment ?PT Visit Diagnosis: Muscle weakness (generalized) (M62.81) ?  ? ?Time: 8889-1694 ?PT Time Calculation (min) (ACUTE ONLY): 22 min ? ? ?Charges:   PT Evaluation ?$PT Eval Low Complexity: 1 Low ?  ?  ?   ? ? ?Tresa Endo PT ?Acute Rehabilitation Services ?Pager [phone] ?Office [phone] ? ? ? ?Claretha Cooper ?11/23/2021, 4:19 PM ? ?

## 2021-11-23 NOTE — Progress Notes (Signed)
PROGRESS NOTE  Daniel Ochoa GMW:[id number] DOB: 1941-03-20 DOA: 11/29/2021 PCP: Donnajean Lopes, MD  HPI/Recap of past 24 hours: Daniel Ochoa is a 81 y.o. male with medical history significant of DVT, pulmonary embolism off oral anticoagulation, osteoarthritis, stage 3B chronic kidney disease, type 2 diabetes, diabetic peripheral neuropathy, constipation, hypertension, fibromyalgia, hypothyroidism, obesity, OSA on CPAP, unspecified skin cancer who  was recently discharged from the hospital due to TIA, E. coli UTI, RUE edema, and is returning to [facility] ED due to shortness of breath, hypoxia, productive cough, and generalized weakness.  Work-up revealed left lower lobe HCAP, elevated troponin, as well as concern for aspiration.  No anginal symptoms.  Speech therapist evaluated, post MBS on 11/22/2021, recommended dysphagia 1 diet, pure solids and nectar thick liquids.  On admission was started on empiric IV antibiotics meropenem and IV vancomycin.  MRSA screening test was negative therefore IV vancomycin was discontinued.  Due to worsening clinical picture with increasing pulmonary infiltrates on chest x-ray, ID was consulted.  Due to abnormal 2D echo and elevated troponin, cardiology was consulted.  11/23/2021: Patient was seen and examined at his bedside.  He is somnolent and on BiPAP.  He is arousable to voices.  Calm, denies having any pain.  Assessment/Plan: Principal Problem:   Acute respiratory failure with hypoxia (HCC) Active Problems:   Diabetes mellitus type 2 with complications (HCC)   Essential hypertension   OSA on CPAP   Chronic kidney disease, stage IV (severe) (HCC)   Alzheimer's disease ([place])   Hypothyroidism   Class 1 obesity  Persistent acute respiratory failure with hypoxia (HCC) secondary to multifocal HCAP, POA Admission chest x-ray suspicious for left lower lobe HCAP, POA. Repeated chest x-ray done on 11/23/2021 showing improvement of left lung infiltrates  however right side lobes have worsening pulmonary infiltrates, personally reviewed. Continue empiric IV antibiotics meropenem, ID consulted 11/23/2021, deferred antibiotic choice to infectious disease. Follow sputum culture ordered, blood cultures x2 negative to date. Negative MRSA screening test, IV vancomycin DC'd on 11/22/2021 WBC and procalcitonin are downtrending Start incentive spirometer, flutter valve Maintain O2 saturation greater than 92%. Currently on 10L HHFNC, wean off oxygen supplementation as tolerated. Out of bed to chair every shift, mobilize as tolerated.  Multifocal HCAP with concern for aspiration, POA. Failed swallow evaluation at bedside MBS completed on 11/22/2021 recommendation for dysphagia 1 diet, pure solids and nectar thick liquids.  Continue aspiration precautions Continue IV antibiotics empirically. Continue pulmonary toilet, bronchodilators  Bilateral upper extremity superficial venous thrombosis seen on Doppler ultrasound done on 11/23/2021. Findings consistent with age indeterminate superficial vein thrombosis involving the right basilic vein.  Findings consistent with acute superficial vein thrombosis involving the left cephalic vein.  D-dimer elevated and uptrending 6.18 from 4.72.  Started on full dose Lovenox on 11/22/2021, switched to heparin drip on 11/23/2021 due to concern for pulmonary embolism.  Presumptive pulmonary embolism Unable to obtain a CT angio chest due to renal insufficiency Unable to obtain a VQ scan due to inability to lay down flat We will continue heparin drip for now for 24 hours then switch to Eliquis possibly on 11/24/2021.  Dysphagia Evaluated by speech therapist, post MBS on 11/22/2021, recommended dysphagia 1 diet, pure solids and nectar thick liquids. Per his wife at bedside at home was on solid diet. Recent admission for TIA, was unable to obtain imaging of head and neck vessels due to renal insufficiency precluding use of IV  contrast for CT angio head and neck, and  inability to lay down flat for MRI, MRA head and neck. Continue aspiration precautions.  Elevated troponin suspect demand ischemia in the setting of severe hypoxia High-sensitivity troponin elevated 65 with nonspecific ST-T changes. Denies any chest pain Follow 2D echo results: 11/22/2021. Cardiology consulted to assist with the management.  Acute on chronic CKD 3B Baseline creatinine appears to be 2.23 with GFR of 30. Creatinine is uptrending 2.65 with GFR 24. Continue to avoid nephrotoxic agents and hypotension. Monitor urine output with strict I's and O's Repeat renal function test in the morning.  Moderate protein calorie malnutrition Albumin 2.8 Moderate muscle mass loss Dietitian consulted to assist with nutritional status  Chronic diastolic CHF Abnormal findings on 2D echo done on 11/22/2021. LVEF 60 to 65% with grade 2 diastolic dysfunction, the right ventricular size is moderately enlarged. Home diuretics held, defer to cardiology to restart On atenolol 12.5 mg twice daily Strict I's and O's and daily weight  Essential hypertension BP is not at goal, elevated. Restarted home Norvasc 5 mg daily Atenolol 12.5 mg twice daily Continue to closely monitor vital signs  Prediabetes with hyperglycemia (HCC) Hemoglobin A 6.0% on 11/10/2021. Carbohydrate modified diet. Continue insulin sliding scale.     OSA on CPAP Continue CPAP/BiPAP at bedtime.     Hypothyroidism TSH 1.043 on 12/09/2021. Continue Synthroid 300 mg p.o. daily.    Chronic anxiety Resume home regimen Clonazepam as needed for anxiety.     Class 1 obesity BMI 31 Lifestyle modifications as an outpatient.  Physical debility PT OT to assess Fall precautions Mobilize as tolerated with assistance and fall precautions.    Critical care time: 65 minutes.      Advance Care Planning:   Code Status: Full Code    Consults: None.   Family Communication: None at  bedside.   Severity of Illness:       Status is: Inpatient Patient requires at least 2 midnights for further evaluation and treatment of present condition.    Objective: Vitals:   11/23/21 1100 11/23/21 1136 11/23/21 1200 11/23/21 1300  BP: (!) 172/39  (!) 167/42 (!) 168/41  Pulse: 74  73 75  Resp: (!) '22  16 14  '$ Temp:  97.7 F (36.5 C)    TempSrc:  Oral    SpO2: 91%  91% 90%  Weight:      Height:        Intake/Output Summary (Last 24 hours) at 11/23/2021 1339 Last data filed at 11/23/2021 0547 Gross per 24 hour  Intake 350 ml  Output 275 ml  Net 75 ml   Filed Weights   12/05/2021 1135 11/23/2021 1451  Weight: 111.6 kg 109.6 kg    Exam:  General: 81 y.o. year-old male frail-appearing in no acute distress.  He is on BiPAP. Cardiovascular: Regular rate and rhythm no rubs or gallops.   Respiratory: Mild bibasilar rales noted worse on the right lower lobes.  Poor inspiratory effort. Abdomen: Soft nontender normal bowel sounds present. Musculoskeletal: Trace lower extremity edema bilaterally.  Dependent edema in upper extremities bilaterally. Skin: No ulcerative lesions noted.   Psychiatry: Mood is appropriate for condition and setting. Neuro: Somnolent.  Arousable to voices.   Data Reviewed: CBC: Recent Labs  Lab 11/17/21 0526 11/15/2021 1050 11/22/21 0315 11/23/21 0306  WBC 13.3* 17.3* 29.8* 20.7*  NEUTROABS  --  14.2* 26.9* 17.4*  HGB 11.6* 11.6* 12.0* 10.4*  HCT 34.9* 36.0* 38.4* 33.0*  MCV 89.5 91.8 93.9 92.7  PLT 274 416* 397 404*  Basic Metabolic Panel: Recent Labs  Lab 11/20/2021 1050 11/22/21 0315 11/23/21 0306  NA 137 142 143  K 3.2* 4.2 3.6  CL 103 104 107  CO2 '26 26 26  '$ GLUCOSE 285* 291* 214*  BUN 82* 78* 91*  CREATININE 2.21* 2.46* 2.65*  CALCIUM 8.3* 9.0 8.8*  MG  --   --  2.4  PHOS  --   --  4.6   GFR: Estimated Creatinine Clearance: 29.3 mL/min (A) (by C-G formula based on SCr of 2.65 mg/dL (H)). Liver Function Tests: Recent Labs   Lab 12/04/2021 1050 11/22/21 0315 11/23/21 0306  AST 16 14* 13*  ALT '16 13 11  '$ ALKPHOS 57 51 53  BILITOT 0.2* 0.5 0.3  PROT 6.2* 6.8 6.4*  ALBUMIN 2.3* 3.2* 2.8*   No results for input(s): LIPASE, AMYLASE in the last 168 hours. No results for input(s): AMMONIA in the last 168 hours. Coagulation Profile: Recent Labs  Lab 12/15/2021 1050  INR 1.1   Cardiac Enzymes: No results for input(s): CKTOTAL, CKMB, CKMBINDEX, TROPONINI in the last 168 hours. BNP (last 3 results) No results for input(s): PROBNP in the last 8760 hours. HbA1C: No results for input(s): HGBA1C in the last 72 hours. CBG: Recent Labs  Lab 11/22/21 1208 11/22/21 1740 11/22/21 2353 11/23/21 0747 11/23/21 1130  GLUCAP 239* 279* 231* 236* 243*   Lipid Profile: No results for input(s): CHOL, HDL, LDLCALC, TRIG, CHOLHDL, LDLDIRECT in the last 72 hours. Thyroid Function Tests: Recent Labs    11/18/2021 1050  TSH 1.043   Anemia Panel: No results for input(s): VITAMINB12, FOLATE, FERRITIN, TIBC, IRON, RETICCTPCT in the last 72 hours. Urine analysis:    Component Value Date/Time   COLORURINE STRAW (A) 11/28/2021 1050   APPEARANCEUR CLEAR 11/17/2021 1050   LABSPEC 1.009 11/22/2021 1050   PHURINE 5.0 11/19/2021 1050   GLUCOSEU 50 (A) 11/28/2021 1050   HGBUR NEGATIVE 12/11/2021 1050   BILIRUBINUR NEGATIVE 11/20/2021 1050   KETONESUR NEGATIVE 12/08/2021 1050   PROTEINUR NEGATIVE 12/12/2021 1050   UROBILINOGEN 0.2 04/13/2014 2334   NITRITE NEGATIVE 12/05/2021 1050   LEUKOCYTESUR NEGATIVE 11/15/2021 1050   Sepsis Labs: '@LABRCNTIP'$ (procalcitonin:4,lacticidven:4)  ) Recent Results (from the past 240 hour(s))  Blood Culture (routine x 2)     Status: None (Preliminary result)   Collection Time: 12/15/2021 10:50 AM   Specimen: BLOOD  Result Value Ref Range Status   Specimen Description   Final    BLOOD BLOOD LEFT WRIST Performed at [facility], [place] [address], [place], [place] [address]     Special Requests   Final    BOTTLES DRAWN AEROBIC AND ANAEROBIC Blood Culture results may not be optimal due to an excessive volume of blood received in culture bottles Performed at [facility] [address], [place], [place] [address]    Culture   Final    NO GROWTH 2 DAYS Performed at [facility] [address], [place], [place] [address]    Report Status PENDING  Incomplete  Resp Panel by RT-PCR (Flu A&B, Covid) Nasopharyngeal Swab     Status: None   Collection Time: 12/11/2021 10:51 AM   Specimen: Nasopharyngeal Swab; Nasopharyngeal(NP) swabs in vial transport medium  Result Value Ref Range Status   SARS Coronavirus 2 by RT PCR NEGATIVE NEGATIVE Final    Comment: (NOTE) SARS-CoV-2 target nucleic acids are NOT DETECTED.  The SARS-CoV-2 RNA is generally detectable in upper respiratory specimens during the acute phase of infection.  The lowest concentration of SARS-CoV-2 viral copies this assay can detect is 138 copies/mL. A negative result does not preclude SARS-Cov-2 infection and should not be used as the sole basis for treatment or other patient management decisions. A negative result may occur with  improper specimen collection/handling, submission of specimen other than nasopharyngeal swab, presence of viral mutation(s) within the areas targeted by this assay, and inadequate number of viral copies(<138 copies/mL). A negative result must be combined with clinical observations, patient history, and epidemiological information. The expected result is Negative.  Fact Sheet for Patients:  EntrepreneurPulse.com.au  Fact Sheet for Healthcare Providers:  IncredibleEmployment.be  This test is no t yet approved or cleared by the [place] FDA and  has been authorized for detection and/or diagnosis of SARS-CoV-2 by FDA under an Emergency Use Authorization (EUA). This EUA will remain  in effect (meaning this test  can be used) for the duration of the COVID-19 declaration under Section 564(b)(1) of the Act, 21 U.S.C.section 360bbb-3(b)(1), unless the authorization is terminated  or revoked sooner.       Influenza A by PCR NEGATIVE NEGATIVE Final   Influenza B by PCR NEGATIVE NEGATIVE Final    Comment: (NOTE) The Xpert Xpress SARS-CoV-2/FLU/RSV plus assay is intended as an aid in the diagnosis of influenza from Nasopharyngeal swab specimens and should not be used as a sole basis for treatment. Nasal washings and aspirates are unacceptable for Xpert Xpress SARS-CoV-2/FLU/RSV testing.  Fact Sheet for Patients: EntrepreneurPulse.com.au  Fact Sheet for Healthcare Providers: IncredibleEmployment.be  This test is not yet approved or cleared by the [place] FDA and has been authorized for detection and/or diagnosis of SARS-CoV-2 by FDA under an Emergency Use Authorization (EUA). This EUA will remain in effect (meaning this test can be used) for the duration of the COVID-19 declaration under Section 564(b)(1) of the Act, 21 U.S.C. section 360bbb-3(b)(1), unless the authorization is terminated or revoked.  Performed at [facility], [place] [address] [place], [place] [address]   Blood Culture (routine x 2)     Status: None (Preliminary result)   Collection Time: 11/22/2021 10:55 AM   Specimen: BLOOD  Result Value Ref Range Status   Specimen Description   Final    BLOOD LEFT ANTECUBITAL Performed at [facility] [address], [place], [place] [address]    Special Requests   Final    BOTTLES DRAWN AEROBIC AND ANAEROBIC Blood Culture results may not be optimal due to an excessive volume of blood received in culture bottles Performed at [facility] [address] [place], [place] [address]    Culture   Final    NO GROWTH 2 DAYS Performed at [facility] [address], [place], [place]  [address]    Report Status PENDING  Incomplete  MRSA Next Gen by PCR, Nasal     Status: None   Collection Time: 11/24/2021  2:05 PM   Specimen: Nasal Mucosa; Nasal Swab  Result Value Ref Range Status   MRSA by PCR Next Gen NOT DETECTED NOT DETECTED Final    Comment: (NOTE) The GeneXpert MRSA Assay (FDA approved for NASAL specimens only), is one component of a comprehensive MRSA colonization surveillance program. It is not intended to diagnose MRSA infection nor to guide or monitor treatment for MRSA infections. Test performance is not FDA approved in patients less than 75 years old. Performed at [name], [place] [address], [place], [place] [address]  Studies: DG CHEST PORT 1 VIEW  Result Date: 11/23/2021 CLINICAL DATA:  81 year old male with history of hypoxia. EXAM: PORTABLE CHEST 1 VIEW COMPARISON:  Chest x-ray 12/09/2021. FINDINGS: Lung volumes are low. Some improvement in aeration in the left mid to lower lung is noted. However, widespread areas of interstitial prominence and patchy nodular appearing airspace consolidation is noted throughout the right lung on today's study, most evident in the right upper lobe. No definite pleural effusions. No pneumothorax. No evidence of pulmonary edema. Heart size is within normal limits. The patient is rotated to the right on today's exam, resulting in distortion of the mediastinal contours and reduced diagnostic sensitivity and specificity for mediastinal pathology. Surgical clips project over the right side of the thoracic inlet, likely from prior thyroid surgery. IMPRESSION: 1. Worsening multilobar pneumonia in the right lung, most evident in the right upper lobe. 2. Improving pneumonia in the left lung. Electronically Signed   By: Vinnie Langton M.D.   On: 11/23/2021 06:19   VAS [place] UPPER EXTREMITY VENOUS DUPLEX  Result Date: 11/23/2021 UPPER VENOUS STUDY  Patient Name:  [facility]  Date of Exam:   11/23/2021 Medical  Rec #: [id number]            Accession #:    [id number] Date of Birth: 10/13/1940           Patient Gender: M Patient Age:   88 years Exam Location:  [facility] Procedure:      VAS [place] UPPER EXTREMITY VENOUS DUPLEX Referring Phys: Irene Pap --------------------------------------------------------------------------------  Indications: Edema Risk Factors: None identified. Limitations: Poor ultrasound/tissue interface and patient positioning. Comparison Study: No prior studies. Performing Technologist: Oliver Hum RVT  Examination Guidelines: A complete evaluation includes B-mode imaging, spectral Doppler, color Doppler, and power Doppler as needed of all accessible portions of each vessel. Bilateral testing is considered an integral part of a complete examination. Limited examinations for reoccurring indications may be performed as noted.  Right Findings: +----------+------------+---------+-----------+----------+-----------------+  RIGHT      Compressible Phasicity Spontaneous Properties      Summary       +----------+------------+---------+-----------+----------+-----------------+  IJV            Full        Yes        Yes                                   +----------+------------+---------+-----------+----------+-----------------+  Subclavian     Full        Yes        Yes                                   +----------+------------+---------+-----------+----------+-----------------+  Axillary       Full        Yes        Yes                                   +----------+------------+---------+-----------+----------+-----------------+  Brachial       Full        Yes        Yes                                   +----------+------------+---------+-----------+----------+-----------------+  Radial         Full                                                         +----------+------------+---------+-----------+----------+-----------------+  Ulnar          Full                                                          +----------+------------+---------+-----------+----------+-----------------+  Cephalic       Full                                                         +----------+------------+---------+-----------+----------+-----------------+  Basilic        None                                      Age Indeterminate  +----------+------------+---------+-----------+----------+-----------------+ Superficial thrombus detected in the basilic vein is noted to begin in the mid upper arm and extend to the distal forearm.  Left Findings: +----------+------------+---------+-----------+----------+-------+  LEFT       Compressible Phasicity Spontaneous Properties Summary  +----------+------------+---------+-----------+----------+-------+  IJV            Full        Yes        Yes                         +----------+------------+---------+-----------+----------+-------+  Subclavian     Full        Yes        Yes                         +----------+------------+---------+-----------+----------+-------+  Axillary       Full        Yes        Yes                         +----------+------------+---------+-----------+----------+-------+  Brachial       Full        Yes        Yes                         +----------+------------+---------+-----------+----------+-------+  Radial         Full                                               +----------+------------+---------+-----------+----------+-------+  Ulnar          Full                                               +----------+------------+---------+-----------+----------+-------+  Cephalic       None                                       Acute   +----------+------------+---------+-----------+----------+-------+  Basilic        Full                                               +----------+------------+---------+-----------+----------+-------+ Superficial thrombus detected in the cephalic vein is noted to be only in the mid forearm.  Summary:  Right: No evidence of deep vein thrombosis  in the upper extremity. Findings consistent with age indeterminate superficial vein thrombosis involving the right basilic vein.  Left: No evidence of deep vein thrombosis in the upper extremity. Findings consistent with acute superficial vein thrombosis involving the left cephalic vein.  *See table(s) above for measurements and observations.    Preliminary     Scheduled Meds:  albuterol  2.5 mg Nebulization TID   [START ON 11/24/2021] amLODipine  5 mg Oral Daily   vitamin C  500 mg Oral Daily   atenolol  12.5 mg Oral BID   chlorhexidine  15 mL Mouth Rinse BID   Chlorhexidine Gluconate Cloth  6 each Topical Daily   famotidine  10 mg Oral QHS   insulin aspart  0-15 Units Subcutaneous TID WC   insulin aspart  0-5 Units Subcutaneous QHS   insulin aspart  3 Units Subcutaneous TID WC   levothyroxine  200 mcg Oral Once per day on Mon Tue Wed Thu Fri Sat   [START ON 11/26/2021] levothyroxine  300 mcg Oral Once per day on Sun   mouth rinse  15 mL Mouth Rinse BID    Continuous Infusions:  sodium chloride 10 mL/hr at 11/22/21 1010   [START ON 11/24/2021] heparin     meropenem (MERREM) IV Stopped (11/23/21 1051)     LOS: 2 days     Kayleen Memos, MD [facility] Pager [phone]  If 7PM-7AM, please contact night-coverage [email or web address] Password [email or web address] 11/23/2021, 1:39 PM

## 2021-11-23 NOTE — Progress Notes (Signed)
Bilateral upper extremity venous duplex has been completed. ?Preliminary results can be found in CV Proc through chart review.  ?Results were given to the patient's nurse, Shanon Brow. ? ?11/23/21 8:58 AM ?Carlos Levering RVT   ?

## 2021-11-23 NOTE — Progress Notes (Signed)
Initial Nutrition Assessment ? ?DOCUMENTATION CODES:  ? ?Not applicable ? ?INTERVENTION:  ?- will order Nepro Shake BID, each supplement provides 425 kcal and 19 grams protein. ?- will order 1 tablet rena-vit/day. ? ? ?NUTRITION DIAGNOSIS:  ? ?Increased nutrient needs related to acute illness, chronic illness as evidenced by estimated needs. ? ?GOAL:  ? ?Patient will meet greater than or equal to 90% of their needs ? ?MONITOR:  ? ?PO intake, Supplement acceptance, Labs, Weight trends ? ?REASON FOR ASSESSMENT:  ? ?Consult ?Assessment of nutrition requirement/status ? ?ASSESSMENT:  ? ?81 y.o. male with medical history of DVT, pulmonary embolism off oral anticoagulation, osteoarthritis, chronic fatigue, TIA, stage IV CKD, type 2 DM, diabetic peripheral neuropathy, constipation, HTN, fibromyalgia, hypothyroidism, obesity, OSA on CPAP, dementia, unspecified skin cancer. He presented to the ED due to shortness of breath, hypoxia, productive cough, and generalized weakness. Work-up revealed LLL HCAP with concern for aspiration. Speech therapy evaluated, post MBS on 11/22/2021, recommendation for dysphagia 1 diet and nectar thick liquids. ? ?Briefly discussed with RN prior to visit. Patient's wife was at bedside and provided all information as he was resting.  ? ?Patient eats the same things most days. Breakfast is typically a breakfast sandwich with toast and fruit or cereal with toast and fruit, he typically skips lunch, and dinner is a meat and vegetable. ? ?He does have oral nutrition supplement shakes, typically Equate brand available and does sometimes drink these. His wife shares that they watch sodium intake at home and have found these to be the lowest in sodium.  ? ?They often eat in front of the tv. Patient is able to feed himself. Very rarely does he need reminders to swallow his food after chewing. He would deny ever feeling like foods were stuck in his throat if wife would ask him. He would sometimes cough with  liquids but did not seem to with solid foods. ? ?S/p MBS on 3/8 and is on a Dysphagia 1, nectar-thick liquids diet.  ? ?Weight on 3/7 was 242 lb and weight on 07/02/21 was 252 lb. This indicates 10 lb weight loss (4% body weight) in the past 5 months. ? ? ?Labs reviewed; CBGs: 236 and 243 mg/dl, BUN: 91 mg/dl, creatinine: 2.65 mg/dl, GFR: 24 ml/min.  ? ?Medications reviewed; 500 mg ascorbic acid/day, 10 mg oral pepcid/day, sliding scale novolog, 3 units novolog TID, 200 mcg oral synthroid/day. ?  ? ?NUTRITION - FOCUSED PHYSICAL EXAM: ? ?Flowsheet Row Most Recent Value  ?Orbital Region No depletion  ?Upper Arm Region No depletion  ?Thoracic and Lumbar Region Unable to assess  ?Buccal Region No depletion  ?Temple Region No depletion  ?Clavicle Bone Region No depletion  ?Clavicle and Acromion Bone Region No depletion  ?Scapular Bone Region No depletion  ?Dorsal Hand No depletion  ?Patellar Region No depletion  ?Anterior Thigh Region No depletion  ?Posterior Calf Region No depletion  ?Edema (RD Assessment) Mild  [all extremities]  ?Hair Reviewed  ?Eyes Reviewed  ?Mouth Reviewed  ?Skin Reviewed  ?Nails Reviewed  ? ?  ? ? ?Diet Order:   ?Diet Order   ? ?       ?  DIET - DYS 1 Room service appropriate? Yes; Fluid consistency: Nectar Thick  Diet effective now       ?  ? ?  ?  ? ?  ? ? ?EDUCATION NEEDS:  ? ?Education needs have been addressed ? ?Skin:  Skin Assessment: Reviewed RN Assessment ? ?Last BM:  PTA/unknown ? ?  Height:  ? ?Ht Readings from Last 1 Encounters:  ?11/26/2021 '6\' 1"'$  (1.854 m)  ? ? ?Weight:  ? ?Wt Readings from Last 1 Encounters:  ?11/17/2021 109.6 kg  ? ? ? ? ?BMI:  Body mass index is 31.88 kg/m?. ? ?Estimated Nutritional Needs:  ?Kcal:  1900-2100 kcal ?Protein:  95-110 grams ?Fluid:  >/= 1.9 L/day ? ? ? ? ?Jarome Matin, MS, RD, LDN ?Inpatient Clinical Dietitian ?RD pager # available in [place]  ?After hours/weekend pager # available in [place] ? ?

## 2021-11-23 NOTE — Progress Notes (Signed)
Returned to see the patient at his bedside.  His wife was present in the room, provided updates.   ? ?Due to concern for worsening hypoxemia on ABG with 60% FiO2, PH 7.47/41/51, ordered a stat CT chest without contrast and consulted PCCM to assist with the management of his acute hypoxic respiratory failure.  Patient is not on oxygen supplementation at baseline.  We will maintain O2 saturation greater than 92% with PCCM guidance. ?

## 2021-11-23 NOTE — Progress Notes (Signed)
ANTICOAGULATION CONSULT NOTE  ? ?Pharmacy Consult for Heparin ?Indication: Suspected PE ? ?Allergies  ?Allergen Reactions  ? Statins Other (See Comments)  ?  Elevated CK level  ? Lisinopril   ?  Other reaction(s): stopped by pulmonology  to help with breathing  ? Latex Rash  ?  Severe blistery rash  ? ? ?Patient Measurements: ?Height: '6\' 1"'$  (185.4 cm) ?Weight: 109.6 kg (241 lb 10 oz) ?IBW/kg (Calculated) : 79.9 ?Heparin Dosing Weight: 102.8 kg ? ?Vital Signs: ?Temp: 99.5 ?F (37.5 ?C) (03/09 0810) ?Temp Source: Axillary (03/09 0810) ?BP: 171/38 (03/09 1000) ?Pulse Rate: 74 (03/09 1000) ? ?Labs: ?Recent Labs  ?  11/28/2021 ?1050 12/09/2021 ?1742 11/22/21 ?2202 11/23/21 ?0306  ?HGB 11.6*  --  12.0* 10.4*  ?HCT 36.0*  --  38.4* 33.0*  ?PLT 416*  --  397 404*  ?APTT 27  --   --   --   ?LABPROT 14.3  --   --   --   ?INR 1.1  --   --   --   ?CREATININE 2.21*  --  2.46* 2.65*  ?TROPONINIHS 41* 65*  --   --   ? ? ? ?Estimated Creatinine Clearance: 29.3 mL/min (A) (by C-G formula based on SCr of 2.65 mg/dL (H)). ? ? ?Medications:  ?Scheduled:  ? albuterol  2.5 mg Nebulization TID  ? amLODipine  5 mg Oral STAT  ? [START ON 11/24/2021] amLODipine  5 mg Oral Daily  ? vitamin C  500 mg Oral Daily  ? atenolol  12.5 mg Oral BID  ? chlorhexidine  15 mL Mouth Rinse BID  ? Chlorhexidine Gluconate Cloth  6 each Topical Daily  ? [START ON 11/24/2021] enoxaparin (LOVENOX) injection  110 mg Subcutaneous Q24H  ? famotidine  10 mg Oral QHS  ? insulin aspart  0-15 Units Subcutaneous TID WC  ? insulin aspart  0-5 Units Subcutaneous QHS  ? insulin aspart  3 Units Subcutaneous TID WC  ? levothyroxine  200 mcg Oral Once per day on Mon Tue Wed Thu Fri Sat  ? [START ON 11/26/2021] levothyroxine  300 mcg Oral Once per day on Sun  ? mouth rinse  15 mL Mouth Rinse BID  ? ?Infusions:  ? sodium chloride 10 mL/hr at 11/22/21 1010  ? meropenem (MERREM) IV 1 g (11/23/21 0858)  ? ? ?Assessment: ?13 yoM presented on 3/7 with SOB, HCAP, concern for aspiration.  PMH  of anticoagulation for history of DVT/PE, but no longer taking any oral anticoagulation (Warfarin x6 months in 2015).  He was started on Lovenox '40mg'$  daily for VTE prophylaxis on admission.  Ddimer 4.7, Dopplers negative for DVT, limited exam.  Pharmacy was initially consulted to dose Lovenox for possible PE, and is now consulted to transition to Heparin.  ? ?Today, 11/23/2021:  ?Most recent Lovenox '110mg'$  dose on 3/9 at 05:00. ?SCr increased to 2.65, CrCl ~ 29 ml/min.  (Baseline CKD-IV) ?CBC: Hgb low/decreased to 10.4 (near baseline), Plt elevated ?3/9 UE Dopplers: No acute DVT.  Age indeterminate superficial vein thrombosis involving the right basilic vein.  Acute superficial vein thrombosis involving the left cephalic vein.  ? ?Goal of Therapy:  ?Anti-Xa level 0.6-1 units/ml 4hrs after LMWH dose given ?Monitor platelets by anticoagulation protocol: Yes ?  ?Plan:  ?D/C Lovenox ?Begin heparin IV infusions (no bolus) 4 hours before the next Lovenox dose due.   ?Start heparin IV infusion at 1650 units/hr on 3/10 at 01:00 AM ?Heparin level 8 hours after starting ?Daily  heparin level and CBC ?Follow up renal function, VTE testing ? ? ?Gretta Arab PharmD, BCPS ?Clinical Pharmacist ?[facility] [phone] ?11/23/2021 10:38 AM ? ? ?

## 2021-11-23 NOTE — Consult Note (Signed)
?   ? ? ? ? ?[facility] for Infectious Disease   ? ?Date of Admission:  12/03/2021   Total days of inpatient antibiotics 2 ? ?      ?Reason for Consult: Pneumonia    ?Principal Problem: ?  Acute respiratory failure with hypoxia ([place]) ?Active Problems: ?  Diabetes mellitus type 2 with complications ([place]) ?  Essential hypertension ?  OSA on CPAP ?  Chronic kidney disease, stage IV (severe) ([place]) ?  Alzheimer's disease ([place]) ?  Hypothyroidism ?  Class 1 obesity ? ? ?Assessment: ?81 YM admitted for pneumonia, ID engaged as pt had worsening CSR findings.   ? ?#Right and left lung PNA ?#Right lung aspiration ?#CPAP qhs at home ?-Initial CXR showed left hilum and lower chest densities concerning for PNA. Repeat CXR on 3/9 showed right upper lung PNA.  ?-Speech consulted and diet changed to dysphagia 1 diet, puree solids and nectar thick liquid. O2 demand is relatively unchanged. We can get advanced imaging for further characterization, suspect right lung findings are consistent with aspiration event.  ?-Wife reports pt's breathing has improved ?Recommendations:  ?-Sputum Cx ordered ?-Continue meropenem to complete 7 days of antibiotics for hospital acquired pneumonia ?-CT chest if pt clinically worsens. Will continue to follow ?-Follow blood Cx ?Microbiology:   ?Antibiotics: ?Meropenem 3/7-p ?Vancomycin 3/7 ? ?Cultures: ?Blood ?3/7 NG ? ? ? ?HPI: Daniel Ochoa is a 81 y.o. male DVT, embolism, osteoarthritis, CKD stage IV, DMII, HTN, fibromyalgia, hypothyroid, obesity OSA on CPAP, recent admission for TIA, E. coli UTI admitted for acute respiratory failure.  Patient presented with shortness of breath and hypoxia along with weakness and productive cough.  In the ED temp of 95.3, WBC 17 K.  Chest x-ray showed increased density in the left hilum and left lower chest concerning for pneumonia.  He underwent speech evaluation.  Found to have dysphagia negative fever and diet change to dysphagia 1 diet, puree solids and  nectar thick liquid.  Repeat CX showed worsening multilobar pneumonia in right lung(more prominent in RUL) this AM. Now on 10L [employer], wbc trending down and remain afebrile  ? ? ?Review of Systems: ?Review of Systems  ?All other systems reviewed and are negative. ? ?Past Medical History:  ?Diagnosis Date  ? Acute deep vein thrombosis (DVT) of tibial vein of right lower extremity ([place]) 04/13/2014  ? Acute pulmonary embolism ([place]) 04/15/2014  ? Arthritis   ? Chronic fatigue   ? Chronic kidney disease, stage IV (severe) ([place])   ? Complication of anesthesia   ? one surgery took 4 people to hold him down 2000  ? Constipation   ? Diabetic peripheral neuropathy ([place]) 10/26/2015  ? DM (diabetes mellitus), type 2, uncontrolled, with renal complications   ? [facility]  ? DVT (deep venous thrombosis) ([place])   ? RLE DVT with intermediate risk VQ scan for PE 03/2014  ? Dyspnea   ? Essential hypertension   ? Fibromyalgia   ? Hypothyroidism   ? Hypoxia 04/13/2014  ? Neuropathy   ? Obesity   ? OSA on CPAP   ? Pulmonary embolism ([place]) 04/13/2014  ? VQ 04/13/14 intermediate assoc with R DVT > on coumadin since Echo 04/14/14 > PA peak pressure: 89 mm Hg (S).   ? ? ?Social History  ? ?Tobacco Use  ? Smoking status: Former  ?  Types: Pipe  ? Smokeless tobacco: Never  ? Tobacco comments:  ?  quit in 1993  ?Vaping Use  ?  Vaping Use: Never used  ?Substance Use Topics  ? Alcohol use: No  ? Drug use: No  ? ? ?Family History  ?Problem Relation Age of Onset  ? Cancer Father   ? ?Scheduled Meds: ? albuterol  2.5 mg Nebulization TID  ? [START ON 11/24/2021] amLODipine  5 mg Oral Daily  ? vitamin C  500 mg Oral Daily  ? atenolol  12.5 mg Oral BID  ? chlorhexidine  15 mL Mouth Rinse BID  ? Chlorhexidine Gluconate Cloth  6 each Topical Daily  ? famotidine  10 mg Oral QHS  ? insulin aspart  0-15 Units Subcutaneous TID WC  ? insulin aspart  0-5 Units Subcutaneous QHS  ? insulin aspart  3 Units Subcutaneous TID WC  ? levothyroxine  200 mcg Oral Once per day on  Mon Tue Wed Thu Fri Sat  ? [START ON 11/26/2021] levothyroxine  300 mcg Oral Once per day on Sun  ? mouth rinse  15 mL Mouth Rinse BID  ? ?Continuous Infusions: ? sodium chloride 10 mL/hr at 11/22/21 1010  ? meropenem (MERREM) IV Stopped (11/23/21 1051)  ? ?PRN Meds:.Place/Maintain arterial line **AND** sodium chloride, acetaminophen **OR** acetaminophen, albuterol, clonazePAM, ipratropium, ondansetron **OR** ondansetron (ZOFRAN) IV, polyvinyl alcohol ?Allergies  ?Allergen Reactions  ? Statins Other (See Comments)  ?  Elevated CK level  ? Lisinopril   ?  Other reaction(s): stopped by pulmonology  to help with breathing  ? Latex Rash  ?  Severe blistery rash  ? ? ?OBJECTIVE: ?Blood pressure (!) 172/39, pulse 74, temperature 97.7 ?F (36.5 ?C), temperature source Oral, resp. rate (!) 22, height '6\' 1"'$  (1.854 m), weight 109.6 kg, SpO2 91 %. ? ?Physical Exam ?Constitutional:   ?   General: He is not in acute distress. ?   Appearance: He is normal weight. He is not toxic-appearing.  ?HENT:  ?   Head: Normocephalic and atraumatic.  ?   Right Ear: External ear normal.  ?   Left Ear: External ear normal.  ?   Nose: No congestion or rhinorrhea.  ?   Mouth/Throat:  ?   Mouth: Mucous membranes are moist.  ?   Pharynx: Oropharynx is clear.  ?Eyes:  ?   Extraocular Movements: Extraocular movements intact.  ?   Conjunctiva/sclera: Conjunctivae normal.  ?   Pupils: Pupils are equal, round, and reactive to light.  ?Cardiovascular:  ?   Rate and Rhythm: Normal rate and regular rhythm.  ?   Heart sounds: No murmur heard. ?  No friction rub. No gallop.  ?Pulmonary:  ?   Effort: Pulmonary effort is normal.  ?   Breath sounds: Normal breath sounds.  ?Abdominal:  ?   General: Abdomen is flat. Bowel sounds are normal.  ?   Palpations: Abdomen is soft.  ?Musculoskeletal:     ?   General: No swelling. Normal range of motion.  ?   Cervical back: Normal range of motion and neck supple.  ?Skin: ?   General: Skin is warm and dry.  ?Neurological:   ?   General: No focal deficit present.  ?   Mental Status: He is oriented to person, place, and time.  ?Psychiatric:     ?   Mood and Affect: Mood normal.  ? ? ?Lab Results ?Lab Results  ?Component Value Date  ? WBC 20.7 (H) 11/23/2021  ? HGB 10.4 (L) 11/23/2021  ? HCT 33.0 (L) 11/23/2021  ? MCV 92.7 11/23/2021  ? PLT 404 (H) 11/23/2021  ?  ?  Lab Results  ?Component Value Date  ? CREATININE 2.65 (H) 11/23/2021  ? BUN 91 (H) 11/23/2021  ? NA 143 11/23/2021  ? K 3.6 11/23/2021  ? CL 107 11/23/2021  ? CO2 26 11/23/2021  ?  ?Lab Results  ?Component Value Date  ? ALT 11 11/23/2021  ? AST 13 (L) 11/23/2021  ? ALKPHOS 53 11/23/2021  ? BILITOT 0.3 11/23/2021  ?  ? ? ? ?Laurice Record, MD ?[facility] for Infectious Disease ?[facility] ?11/23/2021, 12:37 PM  ?

## 2021-11-23 NOTE — Progress Notes (Signed)
Inpatient Diabetes Program Recommendations ? ?AACE/ADA: New Consensus Statement on Inpatient Glycemic Control  ? ?Target Ranges:  Prepandial:   less than 140 mg/dL ?     Peak postprandial:   less than 180 mg/dL (1-2 hours) ?     Critically ill patients:  140 - 180 mg/dL  ? ? Latest Reference Range & Units 11/22/21 08:16 11/22/21 12:08 11/22/21 17:40 11/22/21 23:53 11/23/21 07:47  ?Glucose-Capillary 70 - 99 mg/dL 226 (H) ? ?Novolog 3 units 239 (H) ? ?Novolog 3 units 279 (H) ? ?Novolog 4 units 231 (H) ? ?Novolog 2 units 236 (H) ? ?Novolog 8 units  ?Review of Glycemic Control ? ?Diabetes history: DM2 ?Outpatient Diabetes medications: Novolog 0-9 units TID with meals ?Current orders for Inpatient glycemic control: Novolog 0-15 units TID with meals, Novolog 0-5 units QHS, Novolog 3 units TID with meals ? ?Inpatient Diabetes Program Recommendations:   ? ?Insulin: Please consider ordering Semglee 10 units Q24H (based on 109.6 kg x 0.1 units). ? ?Thanks, ?Barnie Alderman, RN, MSN, CDE ?Diabetes Coordinator ?Inpatient Diabetes Program ?[phone] (Team Pager from 8am to 5pm) ? ? ? ?

## 2021-11-23 NOTE — Consult Note (Signed)
NAME:  Daniel Ochoa, MRN:  [id number], DOB:  [birth year], LOS: 2 ADMISSION DATE:  12/10/2021, CONSULTATION DATE:  11/23/21 REFERRING MD:  [place] Crane - [facility], CHIEF COMPLAINT:  Hypoxemia   History of Present Illness:  81 yo M PMH prior Dvt, prior PE, OA, CKD IV, DM2, OSA on CPAP, Dementia, DNR status who was recently admitted for UTI and TIA, is admitted to [facility] 3/7 with SOB. CXR with L sided opacities. Started on abx for HCAP / Aspiration PNA.  On 3/9 patient has had increasing O2 need and was placed on BiPAP. There was concern for possible PE however unable to get CTA chest due to renal fxn and pt unable to tolerate VQ scan due to severe kyphosis. Started on lovenox for presumptive PE.  An ABG after being on bipap was acquired and is 7.47/41/51.  Repeat ddimer more elevated at 6.18   PCCM is consulted 3/9 evening in this setting. He complains of SOB. No sputum production. His wife endorses that he looks swollen all over to her. Per bedside RN he has been tolerating BiPAP and was on it last night.  Pertinent  Medical History  Dementia OSA TIA PE DVT CKD IV  Significant Hospital Events: Including procedures, antibiotic start and stop dates in addition to other pertinent events   3/7 admitted to [facility] for HCAP  3/9 lovenox for presumptive PE 3/9 BiPAP. Cards consulted for dilated RV. ID was consulted for worse CXR. Abx rec to mero. PCCM consulted for PaO2 51.  Interim History / Subjective:    Objective   Blood pressure (!) 189/142, pulse 87, temperature (!) 100.5 F (38.1 C), temperature source Axillary, resp. rate 18, height '6\' 1"'  (1.854 m), weight 109.6 kg, SpO2 90 %.    FiO2 (%):  [40 %-50 %] 40 %   Intake/Output Summary (Last 24 hours) at 11/23/2021 1846 Last data filed at 11/23/2021 1800 Gross per 24 hour  Intake 350 ml  Output 675 ml  Net -325 ml   Filed Weights   11/24/2021 1135 11/16/2021 1451  Weight: 111.6 kg 109.6 kg    Examination: General: chronically ill appearing man  lying in bed in NAD HENT: [place]/AT, eyes anicteric, oral mucosa moist Lungs: breathing comfortably on [place] 12L, no accessory muscle use. No conversational dyspnea, but not talking much overall. No adventitial breath sounds. Looks like he is having normal tidal breathing. Cardiovascular: S1S2, RRR Abdomen: obese, soft, ND Extremities: +edema, no cyanosis Neuro: awake, alert, delayed speech, says only short phrases, globally weak. Wife is feeding him  Derm: warm, dry, pallor. Dilated skin veins on left chest.  CXR personally reviewed> LLL infiltrate improving, increased nodular markings in R lung   Resolved Hospital Problem list     Assessment & Plan:   Acute hypoxic respiratory failure due to HCAP, Aspiration PNA. PE is possible. Concern for pulmonary edema with edema, nodular appearing markings on CXR, elevated BNP. History of alzheimer's dementia and dysphagia. DNR Status Hx OSA on CPAP  -3/9 AM CXR personally reviewed-- worsening R sided PNA. Some improvement in L Lung  -superficial venous thrombosis -- on full dose lovenox, then changed to heparin gtt. ECHO without RV failure, does show g2dd. On 3/7 trop-I was 41, BNP 195   P -continue abx -- ID recs mero -lasix x 1 dose tonight -as patient is DNR, there is not a more invasive escalation in care from BiPAP. At this point he does not appear to be doing poorly on salter cannula and wouldn't  need more than BiPAP PRN + QHS for comfort. -Would wean resp support as able for SpO2 >88% -on a heparin gtt for possible PE- agree with empiric treatment. -primary has ordered a CT chest; can be done any time overnight -wife updated at bedside and agrees -con't monitoring closely for signs of aspiration   Best Practice (right click and "Reselect all SmartList Selections" daily)   Diet/type: dysphagia diet (see orders) DVT prophylaxis: systemic heparin GI prophylaxis: PPI Lines: N/A Foley:  N/A Code Status:  DNR Last date of multidisciplinary  goals of care discussion [per primary]  Labs   CBC: Recent Labs  Lab 11/17/21 0526 11/19/2021 1050 11/22/21 0315 11/23/21 0306  WBC 13.3* 17.3* 29.8* 20.7*  NEUTROABS  --  14.2* 26.9* 17.4*  HGB 11.6* 11.6* 12.0* 10.4*  HCT 34.9* 36.0* 38.4* 33.0*  MCV 89.5 91.8 93.9 92.7  PLT 274 416* 397 404*    Basic Metabolic Panel: Recent Labs  Lab 11/16/2021 1050 11/22/21 0315 11/23/21 0306  NA 137 142 143  K 3.2* 4.2 3.6  CL 103 104 107  CO2 '26 26 26  ' GLUCOSE 285* 291* 214*  BUN 82* 78* 91*  CREATININE 2.21* 2.46* 2.65*  CALCIUM 8.3* 9.0 8.8*  MG  --   --  2.4  PHOS  --   --  4.6   GFR: Estimated Creatinine Clearance: 29.3 mL/min (A) (by C-G formula based on SCr of 2.65 mg/dL (H)). Recent Labs  Lab 11/17/21 0526 12/11/2021 1050 11/22/21 0315 11/22/21 0803 11/23/21 0306  PROCALCITON  --   --   --  0.56 0.51  WBC 13.3* 17.3* 29.8*  --  20.7*  LATICACIDVEN  --  1.2  --   --   --     Liver Function Tests: Recent Labs  Lab 11/29/2021 1050 11/22/21 0315 11/23/21 0306  AST 16 14* 13*  ALT '16 13 11  ' ALKPHOS 57 51 53  BILITOT 0.2* 0.5 0.3  PROT 6.2* 6.8 6.4*  ALBUMIN 2.3* 3.2* 2.8*   No results for input(s): LIPASE, AMYLASE in the last 168 hours. No results for input(s): AMMONIA in the last 168 hours.  ABG    Component Value Date/Time   PHART 7.47 (H) 11/23/2021 1744   PCO2ART 41 11/23/2021 1744   PO2ART 51 (L) 11/23/2021 1744   HCO3 29.9 (H) 11/23/2021 1744   TCO2 24 11/10/2021 1406   O2SAT 90.2 11/23/2021 1744     Coagulation Profile: Recent Labs  Lab 12/04/2021 1050  INR 1.1    Cardiac Enzymes: No results for input(s): CKTOTAL, CKMB, CKMBINDEX, TROPONINI in the last 168 hours.  HbA1C: Hgb A1c MFr Bld  Date/Time Value Ref Range Status  11/10/2021 07:14 PM 6.0 (H) 4.8 - 5.6 % Final    Comment:    (NOTE) Pre diabetes:          5.7%-6.4%  Diabetes:              >6.4%  Glycemic control for   <7.0% adults with diabetes   04/14/2014 01:15 AM 7.1 (H)  <5.7 % Final    Comment:    (NOTE)  According to the ADA Clinical Practice Recommendations for 2011, when HbA1c is used as a screening test:  >=6.5%   Diagnostic of Diabetes Mellitus           (if abnormal result is confirmed) 5.7-6.4%   Increased risk of developing Diabetes Mellitus References:Diagnosis and Classification of Diabetes Mellitus,Diabetes UKGU,5427,06(CBJSE 1):S62-S69 and Standards of Medical Care in         Diabetes - 2011,Diabetes GBTD,1761,60 (Suppl 1):S11-S61.    CBG: Recent Labs  Lab 11/22/21 1740 11/22/21 2353 11/23/21 0747 11/23/21 1130 11/23/21 1719  GLUCAP 279* 231* 236* 243* 271*    Review of Systems:   ROS limited due to patient's dementia.  Past Medical History:  He,  has a past medical history of Acute deep vein thrombosis (DVT) of tibial vein of right lower extremity ([place]) (04/13/2014), Acute pulmonary embolism ([place]) (04/15/2014), Arthritis, Chronic fatigue, Chronic kidney disease, stage IV (severe) ([place]), Complication of anesthesia, Constipation, Diabetic peripheral neuropathy ([place]) (10/26/2015), DM (diabetes mellitus), type 2, uncontrolled, with renal complications, DVT (deep venous thrombosis) ([place]), Dyspnea, Essential hypertension, Fibromyalgia, Hypothyroidism, Hypoxia (04/13/2014), Neuropathy, Obesity, OSA on CPAP, and Pulmonary embolism ([place]) (04/13/2014).   Surgical History:   Past Surgical History:  Procedure Laterality Date   AV FISTULA PLACEMENT Left 04/18/2017   Procedure: ARTERIOVENOUS (AV) FISTULA CREATION;  Surgeon: Angelia Mould, MD;  Location: [facility];  Service: Vascular;  Laterality: Left;   [place] Right 11/14/2020   Procedure: BASCILIC VEIN TRANSPOSITION 1ST STAGE RIGHT;  Surgeon: Angelia Mould, MD;  Location: [facility];  Service: Vascular;  Laterality: Right;   [place] Right 01/05/2021   Procedure: RIGHT SECOND STAGE  [place];  Surgeon: Serafina Mitchell, MD;  Location: [facility] OR;  Service: Vascular;  Laterality: Right;   CERVICAL LAMINECTOMY     LIGATION OF ARTERIOVENOUS  FISTULA Left 06/01/2021   Procedure: LIGATION OF LEFT ARM FISTULA;  Surgeon: Serafina Mitchell, MD;  Location: [facility] OR;  Service: Vascular;  Laterality: Left;   LUMBAR FUSION     x 2   ROTATOR CUFF REPAIR Right    THYROIDECTOMY, PARTIAL       Social History:   reports that he has quit smoking. His smoking use included pipe. He has never used smokeless tobacco. He reports that he does not drink alcohol and does not use drugs.   Family History:  His family history includes Cancer in his father.   Allergies Allergies  Allergen Reactions   Statins Other (See Comments)    Elevated CK level   Lisinopril     Other reaction(s): stopped by pulmonology  to help with breathing   Latex Rash    Severe blistery rash     Home Medications  Prior to Admission medications   Medication Sig Start Date End Date Taking? Authorizing Provider  amLODipine (NORVASC) 5 MG tablet Take 5 mg by mouth daily.  03/18/15  Yes [provider]  atenolol (TENORMIN) 50 MG tablet Take 50 mg by mouth 2 (two) times daily.  01/31/14  Yes [provider]  Calcium-Vitamin D-Vitamin K ([employer] CALCIUM SOFT CHEWS) 650-12.5-40 MG-MCG-MCG CHEW Chew 1-2 tablets by mouth 2 (two) times daily. Take 1 tablet in the morning & take 2 tablets by mouth at night   Yes [provider]  carboxymethylcellulose (REFRESH PLUS) 0.5 % SOLN Place 2 drops into both eyes 3 (three) times daily as needed (allergy/irritated eyes.).   Yes [provider]  clonazePAM (KLONOPIN) 1 MG  tablet Take 1 tablet (1 mg total) by mouth at bedtime. 11/17/21  Yes Patrecia Pour, MD  famotidine (PEPCID) 40 MG tablet Take 40 mg by mouth at bedtime.  03/17/14  Yes [provider]  furosemide (LASIX) 40 MG tablet Take 40-80 mg by mouth See admin instructions. Take 2  tablets (80 mg) by mouth in the morning & take 1 tablet (40 mg) by mouth in the afternoon. 10/20/14  Yes [provider]  insulin aspart (NOVOLOG) 100 UNIT/ML injection Inject 0-9 Units into the skin 3 (three) times daily with meals. 11/17/21  Yes Patrecia Pour, MD  ipratropium (ATROVENT) 0.03 % nasal spray Place 2 sprays into both nostrils daily as needed for rhinitis.   Yes [provider]  SYNTHROID 200 MCG tablet Take 200-300 mcg by mouth See admin instructions. Take 1 tablet (200 mcg) by mouth daily except on Sundays take 1.5 tablets (300 mcg) tablet by mouth 01/12/14  Yes [provider]  vitamin C (ASCORBIC ACID) 250 MG tablet Take 250-500 mg by mouth See admin instructions. Take 1 tablet (250 mg) by mouth in the morning and take 2 tablets (500 mg) by mouth at night   Yes [provider]  Accu-Chek Softclix Lancets lancets USE TO CHECK BLOOD SUGAR UP TO TID E11.29 12/21/19   [provider]  FREESTYLE TEST STRIPS test strip USE AS DIRECTED TO TEST BLOOD SUGAR 5 TIMES PER DAY. DX: E11.65 06/06/18   [provider]  Lancets Misc. (ACCU-CHEK FASTCLIX LANCET) KIT USE TO CHECK BLOOD SUGAR UP TO TID E11.29 12/23/19   [provider]     Critical care time:     Julian Hy, DO 11/23/21 8:45 PM [facility]

## 2021-11-23 NOTE — Consult Note (Signed)
[facility] CM Inpatient Consult ? ? ?11/23/2021 ? ?Daniel Ochoa ?January 10, 1941 ?[id number] ? ?[facility] CM) ?  ?Patient was reviewed for less than 30 days readmission and noted high risk score for unplanned readmission. Assessed for post hospital chronic disease management and care coordination needs.  ? ?Plan: Will continue to follow for progression and disposition needs.  ?  ?Of note, [facility] Care Management services does not replace or interfere with any services that are arranged by inpatient case management or social work.  ? ?Netta Cedars, MSN, RN ?[facility] [facility] Liaison ?Mobile Phone [phone]  ?Toll free office [phone]  ? ?

## 2021-11-23 NOTE — Consult Note (Signed)
Cardiology Consultation:   Patient ID: Daniel Ochoa MRN: [id number]; DOB: 01/06/1941  Admit date: 11/16/2021 Date of Consult: 11/23/2021  PCP:  Donnajean Lopes, MD   [facility] Providers Cardiologist:  Larae Grooms, MD       Previously Dr. Rayann Heman in 2021 for EP Patient Profile:   Daniel Ochoa is a 81 y.o. male with a hx of  atrial fib (single episode noted on sleep study) and remote DVT/PE, hypothyroidism, OSA on CPAP, HTN, DM- on insulin pump, CKD 4 with AV fistula not yet on HD, recent TIA, Dementia   who is being seen 11/23/2021 for the evaluation of possible PE at the request of Dr. Nevada Crane.  History of Present Illness:   Mr. Asare with above hx and in 2021 A fib was noted on sleep study.  Dr. Irish Lack recommended ILR and pt saw Dr. Rayann Heman pt refused ILR.  He did wear outpt monitor with no a fib.  He had echo then as well. And normal LV/RV and valvular function.  Mild LVH.  No follow up visits.  (In 2015 was on coumadin for j6 months post DVT/PE)  Pt now has ESRD/CKD 4 with AV fistula.  He was hospitalized 2/24 until 11/17/21 with TIA and UTI with e.coli  He was discharged to [facility] and rehab.    Pt presented to ER 12/12/2021 with SOB -He had hypoxia.   Imaging: One-view portable chest radiograph showed increased densities in the left hilum and left lower chest concerning for pneumonia.  PA and lateral x-ray recommended in 3 to 4 weeks following antibiotic therapy to ensure resolution and exclude underlying malignancy Follow up CXR today  IMPRESSION: 1. Worsening multilobar pneumonia in the right lung, most evident in the right upper lobe. 2. Improving pneumonia in the left lung.   Placed on ABX and nebs. Thought this may be due to aspiration PNA and failed swallow eval.    His hs tropoin was 41 then 65 and EKG with non specific ST T changes.    Ddimer was elevated at 4.72 and today 6.18 unable to do VQ or CT due to kyphosis.    BNP 195  Today Na 143, K+  3.6 Cr 2.65 BUN 91 alb 2.8  Hgb 10.4 WBC 20.7 plts404 Echo 11/22/21 with EF 60-65% No RWMA.  Mild concentric LVH G2 DD. RV size mod enlarged. LA mod dilated, Rt atrial mild dilated  Venous doppler upper ext no DVT bil but acute superficial vein thrombosis lt cephalic vein, and indeterminate on rt  Venous lower ext dopplers neg DVT though portions of exam limited.   Pt now on lovenox PE dose.    EKG:  The EKG was personally reviewed and demonstrates:  SR with PACs  Telemetry:  Telemetry was personally reviewed and demonstrates:  review of tele strip from Feb this year one episode of SVT rate 143 - pt's P waves have been difficult to eval at time.    This admit some pauses appear to be non conducted PACs though will ask MD to review   .  Pt is now DNR  BP 172/39 to 167/43 P 73  low grade fever at 99.5 X 1 today  Past Medical History:  Diagnosis Date   Acute deep vein thrombosis (DVT) of tibial vein of right lower extremity ([place]) 04/13/2014   Acute pulmonary embolism ([place]) 04/15/2014   Arthritis    Chronic fatigue    Chronic kidney disease, stage IV (severe) ([place])  Complication of anesthesia    one surgery took 4 people to hold him down 2000   Constipation    Diabetic peripheral neuropathy ([place]) 10/26/2015   DM (diabetes mellitus), type 2, uncontrolled, with renal complications    [facility]   DVT (deep venous thrombosis) ([place])    RLE DVT with intermediate risk VQ scan for PE 03/2014   Dyspnea    Essential hypertension    Fibromyalgia    Hypothyroidism    Hypoxia 04/13/2014   Neuropathy    Obesity    OSA on CPAP    Pulmonary embolism ([place]) 04/13/2014   VQ 04/13/14 intermediate assoc with R DVT > on coumadin since Echo 04/14/14 > PA peak pressure: 89 mm Hg (S).     Past Surgical History:  Procedure Laterality Date   AV FISTULA PLACEMENT Left 04/18/2017   Procedure: ARTERIOVENOUS (AV) FISTULA CREATION;  Surgeon: Angelia Mould, MD;  Location: [place];  Service: Vascular;   Laterality: Left;   [place] Right 11/14/2020   Procedure: BASCILIC VEIN TRANSPOSITION 1ST STAGE RIGHT;  Surgeon: Angelia Mould, MD;  Location: [place];  Service: Vascular;  Laterality: Right;   [place] Right 01/05/2021   Procedure: RIGHT SECOND STAGE [place];  Surgeon: Serafina Mitchell, MD;  [facility];  Service: Vascular;  Laterality: Right;   CERVICAL LAMINECTOMY     LIGATION OF ARTERIOVENOUS  FISTULA Left 06/01/2021   Procedure: LIGATION OF LEFT ARM FISTULA;  Surgeon: Serafina Mitchell, MD;  [facility];  Service: Vascular;  Laterality: Left;   LUMBAR FUSION     x 2   ROTATOR CUFF REPAIR Right    THYROIDECTOMY, PARTIAL       Home Medications:  Prior to Admission medications   Medication Sig Start Date End Date Taking? Authorizing Provider  amLODipine (NORVASC) 5 MG tablet Take 5 mg by mouth daily.  03/18/15  Yes [provider]  atenolol (TENORMIN) 50 MG tablet Take 50 mg by mouth 2 (two) times daily.  01/31/14  Yes [provider]  Calcium-Vitamin D-Vitamin K ([employer] CALCIUM SOFT CHEWS) 650-12.5-40 MG-MCG-MCG CHEW Chew 1-2 tablets by mouth 2 (two) times daily. Take 1 tablet in the morning & take 2 tablets by mouth at night   Yes [provider]  carboxymethylcellulose (REFRESH PLUS) 0.5 % SOLN Place 2 drops into both eyes 3 (three) times daily as needed (allergy/irritated eyes.).   Yes [provider]  clonazePAM (KLONOPIN) 1 MG tablet Take 1 tablet (1 mg total) by mouth at bedtime. 11/17/21  Yes Patrecia Pour, MD  famotidine (PEPCID) 40 MG tablet Take 40 mg by mouth at bedtime.  03/17/14  Yes [provider]  furosemide (LASIX) 40 MG tablet Take 40-80 mg by mouth See admin instructions. Take 2 tablets (80 mg) by mouth in the morning & take 1 tablet (40 mg) by mouth in the afternoon. 10/20/14  Yes [provider]  insulin aspart (NOVOLOG) 100 UNIT/ML injection Inject 0-9 Units  into the skin 3 (three) times daily with meals. 11/17/21  Yes Patrecia Pour, MD  ipratropium (ATROVENT) 0.03 % nasal spray Place 2 sprays into both nostrils daily as needed for rhinitis.   Yes [provider]  SYNTHROID 200 MCG tablet Take 200-300 mcg by mouth See admin instructions. Take 1 tablet (200 mcg) by mouth daily except on Sundays take 1.5 tablets (300 mcg) tablet by mouth 01/12/14  Yes [provider]  vitamin  C (ASCORBIC ACID) 250 MG tablet Take 250-500 mg by mouth See admin instructions. Take 1 tablet (250 mg) by mouth in the morning and take 2 tablets (500 mg) by mouth at night   Yes [provider]  Accu-Chek Softclix Lancets lancets USE TO CHECK BLOOD SUGAR UP TO TID E11.29 12/21/19   [provider]  FREESTYLE TEST STRIPS test strip USE AS DIRECTED TO TEST BLOOD SUGAR 5 TIMES PER DAY. DX: E11.65 06/06/18   [provider]  Lancets Misc. (ACCU-CHEK FASTCLIX LANCET) KIT USE TO CHECK BLOOD SUGAR UP TO TID E11.29 12/23/19   [provider]    Inpatient Medications: Scheduled Meds:  albuterol  2.5 mg Nebulization TID   [START ON 11/24/2021] amLODipine  5 mg Oral Daily   vitamin C  500 mg Oral Daily   atenolol  12.5 mg Oral BID   chlorhexidine  15 mL Mouth Rinse BID   Chlorhexidine Gluconate Cloth  6 each Topical Daily   famotidine  10 mg Oral QHS   insulin aspart  0-15 Units Subcutaneous TID WC   insulin aspart  0-5 Units Subcutaneous QHS   insulin aspart  3 Units Subcutaneous TID WC   levothyroxine  200 mcg Oral Once per day on Mon Tue Wed Thu Fri Sat   [START ON 11/26/2021] levothyroxine  300 mcg Oral Once per day on Sun   mouth rinse  15 mL Mouth Rinse BID   Continuous Infusions:  sodium chloride 10 mL/hr at 11/22/21 1010   meropenem (MERREM) IV Stopped (11/23/21 1051)   PRN Meds: Place/Maintain arterial line **AND** sodium chloride, acetaminophen **OR** acetaminophen, albuterol, clonazePAM, ipratropium, ondansetron **OR**  ondansetron (ZOFRAN) IV, polyvinyl alcohol  Allergies:    Allergies  Allergen Reactions   Statins Other (See Comments)    Elevated CK level   Lisinopril     Other reaction(s): stopped by pulmonology  to help with breathing   Latex Rash    Severe blistery rash    Social History:   Social History   Socioeconomic History   Marital status: Married    Spouse name: Not on file   Number of children: 1   Years of education: Not on file   Highest education level: Not on file  Occupational History   Not on file  Tobacco Use   Smoking status: Former    Types: Pipe   Smokeless tobacco: Never   Tobacco comments:    quit in 1993  Vaping Use   Vaping Use: Never used  Substance and Sexual Activity   Alcohol use: No   Drug use: No   Sexual activity: Not on file  Other Topics Concern   Not on file  Social History Narrative   Not on file   Social Determinants of Health   Financial Resource Strain: Not on file  Food Insecurity: Not on file  Transportation Needs: Not on file  Physical Activity: Not on file  Stress: Not on file  Social Connections: Not on file  Intimate Partner Violence: Not on file    Family History:    Family History  Problem Relation Age of Onset   Cancer Father      ROS:  Please see the history of present illness.  General:no colds or fevers, no weight changes Skin:no rashes or ulcers HEENT:no blurred vision, no congestion CV:see HPI PUL:see HPI GI:no diarrhea constipation or melena, no indigestion GU:no hematuria, no dysuria MS:no joint pain, no claudication Neuro:no syncope, no lightheadedness Endo:+ diabetes, +  thyroid disease  All other ROS reviewed and negative.     Physical Exam/Data:   Vitals:   11/23/21 0821 11/23/21 0900 11/23/21 1000 11/23/21 1136  BP:  (!) 174/48 (!) 171/38   Pulse:  72 74   Resp:  11 14   Temp:    97.7 F (36.5 C)  TempSrc:    Oral  SpO2: 96% 92% 91%   Weight:      Height:        Intake/Output Summary  (Last 24 hours) at 11/23/2021 1147 Last data filed at 11/23/2021 0547 Gross per 24 hour  Intake 370 ml  Output 575 ml  Net -205 ml   Last 3 Weights 11/27/2021 11/19/2021 11/10/2021  Weight (lbs) 241 lb 10 oz 246 lb 253 lb 1.4 oz  Weight (kg) 109.6 kg 111.585 kg 114.8 kg     Body mass index is 31.88 kg/m.  General:  Well nourished, well developed, in no acute distress  HEENT: normal Neck: no JVD Vascular: No carotid bruits; Distal pulses 2+ bilaterally Cardiac:  normal S1, S2; RRR; no murmur gallup rub click Lungs:  clear to auscultation bilaterally, no wheezing, rhonchi or rales  Abd: soft, nontender, no hepatomegaly  Ext: no edema Musculoskeletal:  No deformities, BUE and BLE strength normal and equal Skin: warm and dry  Neuro:  alert and oriented X 3 , no focal abnormalities noted Psych:  Normal affect    Relevant CV Studies: Echo 11/22/21 IMPRESSIONS     1. Left ventricular ejection fraction, by estimation, is 60 to 65%. The  left ventricle has normal function. The left ventricle has no regional  wall motion abnormalities. There is mild concentric left ventricular  hypertrophy. Left ventricular diastolic  parameters are consistent with Grade II diastolic dysfunction  (pseudonormalization).   2. Right ventricular systolic function is normal. The right ventricular  size is moderately enlarged. There is normal pulmonary artery systolic  pressure.   3. Left atrial size was moderately dilated.   4. Right atrial size was mildly dilated.   5. The mitral valve was not well visualized. Trivial mitral valve  regurgitation. No evidence of mitral stenosis. Moderate mitral annular  calcification.   6. The aortic valve is tricuspid. There is mild calcification of the  aortic valve. There is mild thickening of the aortic valve. Aortic valve  regurgitation is not visualized. Aortic valve sclerosis is present, with  no evidence of aortic valve stenosis.   Comparison(s): No significant  change from prior study.   FINDINGS   Left Ventricle: Left ventricular ejection fraction, by estimation, is 60  to 65%. The left ventricle has normal function. The left ventricle has no  regional wall motion abnormalities. The left ventricular internal cavity  size was normal in size. There is   mild concentric left ventricular hypertrophy. Left ventricular diastolic  parameters are consistent with Grade II diastolic dysfunction  (pseudonormalization).   Right Ventricle: The right ventricular size is moderately enlarged. Right  vetricular wall thickness was not well visualized. Right ventricular  systolic function is normal. There is normal pulmonary artery systolic  pressure. The tricuspid regurgitant  velocity is 2.39 m/s, and with an assumed right atrial pressure of 8 mmHg,  the estimated right ventricular systolic pressure is 25.3 mmHg.   Left Atrium: Left atrial size was moderately dilated.   Right Atrium: Right atrial size was mildly dilated.   Pericardium: There is no evidence of pericardial effusion.   Mitral Valve: The mitral valve was not  well visualized. There is moderate  thickening of the mitral valve leaflet(s). There is moderate calcification  of the mitral valve leaflet(s). Moderate mitral annular calcification.  Trivial mitral valve  regurgitation. No evidence of mitral valve stenosis. MV peak gradient, 5.2  mmHg. The mean mitral valve gradient is 2.0 mmHg.   Tricuspid Valve: The tricuspid valve is grossly normal. Tricuspid valve  regurgitation is trivial. No evidence of tricuspid stenosis.   Aortic Valve: The aortic valve is tricuspid. There is mild calcification  of the aortic valve. There is mild thickening of the aortic valve. Aortic  valve regurgitation is not visualized. Aortic valve sclerosis is present,  with no evidence of aortic valve  stenosis. Aortic valve mean gradient measures 3.0 mmHg. Aortic valve peak  gradient measures 7.2 mmHg. Aortic valve  area, by VTI measures 3.28 cm.   Pulmonic Valve: The pulmonic valve was not well visualized. Pulmonic valve  regurgitation is not visualized. No evidence of pulmonic stenosis.   Aorta: The aortic arch was not well visualized and the aortic root and  ascending aorta are structurally normal, with no evidence of dilitation.   Venous: The inferior vena cava was not well visualized.   IAS/Shunts: The interatrial septum was not well visualized.   ECHO 05/18/20 IMPRESSIONS     1. Left ventricular ejection fraction, by estimation, is 60 to 65%. The  left ventricle has normal function. The left ventricle has no regional  wall motion abnormalities. There is mild left ventricular hypertrophy.  Left ventricular diastolic parameters  are indeterminate.   2. Right ventricular systolic function is normal. The right ventricular  size is normal.   3. Left atrial size was mildly dilated.   4. The mitral valve is degenerative. Trivial mitral valve regurgitation.  No evidence of mitral stenosis.   5. The aortic valve was not well visualized. Aortic valve regurgitation  is not visualized. Mild to moderate aortic valve sclerosis/calcification  is present, without any evidence of aortic stenosis.   6. The inferior vena cava is normal in size with greater than 50%  respiratory variability, suggesting right atrial pressure of 3 mmHg.   FINDINGS   Left Ventricle: Left ventricular ejection fraction, by estimation, is 60  to 65%. The left ventricle has normal function. The left ventricle has no  regional wall motion abnormalities. The left ventricular internal cavity  size was normal in size. There is   mild left ventricular hypertrophy. Left ventricular diastolic parameters  are indeterminate.   Right Ventricle: The right ventricular size is normal. No increase in  right ventricular wall thickness. Right ventricular systolic function is  normal.   Left Atrium: Left atrial size was mildly dilated.    Right Atrium: Right atrial size was normal in size.   Pericardium: There is no evidence of pericardial effusion.   Mitral Valve: The mitral valve is degenerative in appearance. There is  moderate thickening of the mitral valve leaflet(s). There is moderate  calcification of the mitral valve leaflet(s). Normal mobility of the  mitral valve leaflets. Moderate mitral  annular calcification. Trivial mitral valve regurgitation. No evidence of  mitral valve stenosis.   Tricuspid Valve: The tricuspid valve is normal in structure. Tricuspid  valve regurgitation is not demonstrated. No evidence of tricuspid  stenosis.   Aortic Valve: The aortic valve was not well visualized. Aortic valve  regurgitation is not visualized. Mild to moderate aortic valve  sclerosis/calcification is present, without any evidence of aortic  stenosis.   Pulmonic Valve: The  pulmonic valve was normal in structure. Pulmonic valve  regurgitation is trivial. No evidence of pulmonic stenosis.   Aorta: The aortic root is normal in size and structure.   Venous: The inferior vena cava is normal in size with greater than 50%  respiratory variability, suggesting right atrial pressure of 3 mmHg.   IAS/Shunts: The interatrial septum was not well visualized.      Laboratory Data:  High Sensitivity Troponin:   Recent Labs  Lab 11/10/21 1409 11/10/21 1725 11/27/2021 1050 11/19/2021 1742  TROPONINIHS 31* 18* 41* 65*     Chemistry Recent Labs  Lab 11/26/2021 1050 11/22/21 0315 11/23/21 0306  NA 137 142 143  K 3.2* 4.2 3.6  CL 103 104 107  CO2 $Re'26 26 26  'mui$ GLUCOSE 285* 291* 214*  BUN 82* 78* 91*  CREATININE 2.21* 2.46* 2.65*  CALCIUM 8.3* 9.0 8.8*  MG  --   --  2.4  GFRNONAA 30* 26* 24*  ANIONGAP $RemoveB'8 12 10    'TRZltrck$ Recent Labs  Lab 11/18/2021 1050 11/22/21 0315 11/23/21 0306  PROT 6.2* 6.8 6.4*  ALBUMIN 2.3* 3.2* 2.8*  AST 16 14* 13*  ALT $Re'16 13 11  'OKU$ ALKPHOS 57 51 53  BILITOT 0.2* 0.5 0.3   Lipids No results for  input(s): CHOL, TRIG, HDL, LABVLDL, LDLCALC, CHOLHDL in the last 168 hours.  Hematology Recent Labs  Lab 12/08/2021 1050 11/22/21 0315 11/23/21 0306  WBC 17.3* 29.8* 20.7*  RBC 3.92* 4.09* 3.56*  HGB 11.6* 12.0* 10.4*  HCT 36.0* 38.4* 33.0*  MCV 91.8 93.9 92.7  MCH 29.6 29.3 29.2  MCHC 32.2 31.3 31.5  RDW 14.0 13.8 14.1  PLT 416* 397 404*   Thyroid  Recent Labs  Lab 12/04/2021 1050  TSH 1.043    BNP Recent Labs  Lab 11/30/2021 1051  BNP 195.4*    DDimer  Recent Labs  Lab 11/22/21 0803 11/23/21 0306  DDIMER 4.72* 6.18*     Radiology/Studies:  DG CHEST PORT 1 VIEW  Result Date: 11/23/2021 CLINICAL DATA:  81 year old male with history of hypoxia. EXAM: PORTABLE CHEST 1 VIEW COMPARISON:  Chest x-ray 11/23/2021. FINDINGS: Lung volumes are low. Some improvement in aeration in the left mid to lower lung is noted. However, widespread areas of interstitial prominence and patchy nodular appearing airspace consolidation is noted throughout the right lung on today's study, most evident in the right upper lobe. No definite pleural effusions. No pneumothorax. No evidence of pulmonary edema. Heart size is within normal limits. The patient is rotated to the right on today's exam, resulting in distortion of the mediastinal contours and reduced diagnostic sensitivity and specificity for mediastinal pathology. Surgical clips project over the right side of the thoracic inlet, likely from prior thyroid surgery. IMPRESSION: 1. Worsening multilobar pneumonia in the right lung, most evident in the right upper lobe. 2. Improving pneumonia in the left lung. Electronically Signed   By: Vinnie Langton M.D.   On: 11/23/2021 06:19   DG Chest Port 1 View  Result Date: 11/15/2021 CLINICAL DATA:  Questionable sepsis.  Evaluate for abnormality. EXAM: PORTABLE CHEST 1 VIEW COMPARISON:  11/10/2021. FINDINGS: Increased densities in the left hilum and left lower chest. Overall, low lung volumes. Again noted are  surgical clips in the right lower neck or upper chest region. Heart size is upper limits of normal but stable. IMPRESSION: Increased densities in the left hilum and left lower chest. Findings are concerning for pneumonia. Followup PA and lateral chest X-ray is recommended in 3-4  weeks following trial of antibiotic therapy to ensure resolution and exclude underlying malignancy. Electronically Signed   By: Markus Daft M.D.   On: 12/15/2021 11:50   DG Swallowing Func-Speech Pathology  Result Date: 11/22/2021 Table formatting from the original result was not included. Objective Swallowing Evaluation: Type of Study: Bedside Swallow Evaluation  Patient Details Name: Daniel Ochoa MRN: [id number] Date of Birth: 1940-11-04 Today's Date: 11/22/2021 Time: SLP Start Time (ACUTE ONLY): 1015 -SLP Stop Time (ACUTE ONLY): 1045 SLP Time Calculation (min) (ACUTE ONLY): 30 min Past Medical History: Past Medical History: Diagnosis Date  Acute deep vein thrombosis (DVT) of tibial vein of right lower extremity ([place]) 04/13/2014  Acute pulmonary embolism ([place]) 04/15/2014  Arthritis   Chronic fatigue   Chronic kidney disease, stage IV (severe) ([facility])   Complication of anesthesia   one surgery took 4 people to hold him down 2000  Constipation   Diabetic peripheral neuropathy ([place]) 10/26/2015  DM (diabetes mellitus), type 2, uncontrolled, with renal complications   [facility]  DVT (deep venous thrombosis) ([place])   RLE DVT with intermediate risk VQ scan for PE 03/2014  Dyspnea   Essential hypertension   Fibromyalgia   Hypothyroidism   Hypoxia 04/13/2014  Neuropathy   Obesity   OSA on CPAP   Pulmonary embolism ([place]) 04/13/2014  VQ 04/13/14 intermediate assoc with R DVT > on coumadin since Echo 04/14/14 > PA peak pressure: 89 mm Hg (S).  Past Surgical History: Past Surgical History: Procedure Laterality Date  AV FISTULA PLACEMENT Left 04/18/2017  Procedure: ARTERIOVENOUS (AV) FISTULA CREATION;  Surgeon: Angelia Mould, MD;  Location: [place];  Service: Vascular;  Laterality: Left;  [place] Right 11/14/2020  Procedure: BASCILIC VEIN TRANSPOSITION 1ST STAGE RIGHT;  Surgeon: Angelia Mould, MD;  Location: [place];  Service: Vascular;  Laterality: Right;  [place] Right 01/05/2021  Procedure: RIGHT SECOND STAGE [place];  Surgeon: Serafina Mitchell, MD;  [facility];  Service: Vascular;  Laterality: Right;  CERVICAL LAMINECTOMY    LIGATION OF ARTERIOVENOUS  FISTULA Left 06/01/2021  Procedure: LIGATION OF LEFT ARM FISTULA;  Surgeon: Serafina Mitchell, MD;  [facility];  Service: Vascular;  Laterality: Left;  LUMBAR FUSION    x 2  ROTATOR CUFF REPAIR Right   THYROIDECTOMY, PARTIAL   HPI: 81 y/o male presented to ED from SNF (after having been at SNF only few days) with chest congestion, cough and found to have pna.  Pt [facility] discharged [facility] after admission for concern for TIA = due to 2 days of slurred speech and weakness. Unable to obtain MRI due to kyphosis per notes. CT head unremarkable. Patient with recent UTI x 2-3 weeks ago.  PMH: DM type 2, Alzheimer's, Afib, CKD stage IV, DVT, unspecified skin cancer, constipation, peripheral neuropathy, chronic fatigue, fibromyalgia, OEA.  Swallow evaluation conducted during prior admit without evidence of dysphagia and regular/thin diet was advised.  CXR showed increased densities in left hilum and left lower chest.   Wife admits pt has been coughing on occasion with liquids more than foods over the last four months.  She states pt mostly slept at home prior to SNF admit due to his medical diagnoses. He remains a full code at this time.  Subjective: pt awake in flouro chair, RN present with pt  Recommendations for follow up therapy are one component of a multi-disciplinary discharge planning process, led by the attending physician.  Recommendations may be  updated based on patient status, additional functional criteria and insurance  authorization. Assessment / Plan / Recommendation Clinical Impressions 11/22/2021 Clinical Impression Patient presents with moderately severe oropharyngeal dysphagia with sensorimotor deficits.  Impaired oral transiting/bolus propulsion noted due to weakness.  Pharyngeal swallow trigger variable - with thin and nectar liquids at pyriform sinus which allowed aspiration during the swallow.  2nd bolus of pudding *later in the study* triggered at vallecular space at 2 seconds and cracker triggered at 5 seconds. Pt impaired pharyngeal motility -allows vallecular and pyriform sinus retention, that mixed with secretions.  Cues to swallow not consistently effective - however when cued to throat clear or cough, pt produced cough and reflexively swallowed. He however does not fully clear retention in pharynx.   Significant aspiration with thin and nectar via cup/straw noted - with cough *likely when aspirates reached the carina* but cough was not effective to clear.  Postures not tested due to patients excessive limited head ROM.    Pt is elevated aspiration and malnutrition risk due to his level of dysphagia. Currently - recommend consider palliative consult to establish [place] given pts recent hospital stay, progressive neuro diagnosis and deconditioning.  At this time, recommend consider CREAMY puree and nectar liquid diet with strict precautions to mitigate aspiration.  Recommend pt be allowed ice chips and tsps of thin water any time.  He only coughed during the MBS when he aspirated - thus if he is coughing with intake, he is definitively aspirating.  Frequent small meals may help maximize nutrition with least risk. Skilled SLP included determining effective compensation strategies and pt education.  Recommended pt work on strengthening his cough and hock expectoration strength for maximal airway protection. Per prior clinical swallow evaluation, pt's swallow function currently appears significantly worse - ? source? SLP  Visit Diagnosis Dysphagia, oropharyngeal phase (R13.12) Attention and concentration deficit following -- Frontal lobe and executive function deficit following -- Impact on safety and function Moderate aspiration risk;Risk for inadequate nutrition/hydration   Treatment Recommendations 11/22/2021 Treatment Recommendations Therapy as outlined in treatment plan below   Prognosis 11/22/2021 Prognosis for Safe Diet Advancement Guarded Barriers to Reach Goals Severity of deficits;Cognitive deficits;Other (Comment) Barriers/Prognosis Comment -- Diet Recommendations 11/22/2021 SLP Diet Recommendations Dysphagia 1 (Puree) solids;Nectar thick liquid;Ice chips PRN after oral care Liquid Administration via [place];No straw Medication Administration -- Compensations Slow rate;Small sips/bites;Other (Comment) Postural Changes --   Other Recommendations 11/22/2021 Recommended Consults -- Oral Care Recommendations Oral care QID Other Recommendations Order thickener from pharmacy Follow Up Recommendations Skilled nursing-short term rehab (<3 hours/day) Assistance recommended at discharge Frequent or constant Supervision/Assistance Functional Status Assessment Patient has had a recent decline in their functional status and demonstrates the ability to make significant improvements in function in a reasonable and predictable amount of time. Frequency and Duration  11/22/2021 Speech Therapy Frequency (ACUTE ONLY) min 2x/week Treatment Duration 1 week   Oral Phase 11/22/2021 Oral Phase Impaired Oral - Pudding Teaspoon -- Oral - Pudding Cup -- Oral - Honey Teaspoon Weak lingual manipulation;Reduced posterior propulsion Oral - Honey Cup -- Oral - Nectar Teaspoon Weak lingual manipulation;Reduced posterior propulsion Oral - Nectar Cup Weak lingual manipulation;Reduced posterior propulsion Oral - Nectar Straw Weak lingual manipulation;Reduced posterior propulsion Oral - Thin Teaspoon Weak lingual manipulation;Reduced posterior propulsion Oral - Thin Cup  Weak lingual manipulation;Reduced posterior propulsion Oral - Thin Straw Weak lingual manipulation;Reduced posterior propulsion Oral - Puree Weak lingual manipulation;Reduced posterior propulsion Oral - Mech Soft Weak lingual manipulation;Impaired mastication;Reduced posterior propulsion Oral -  Regular -- Oral - Multi-Consistency -- Oral - Pill -- Oral Phase - Comment --  Pharyngeal Phase 11/22/2021 Pharyngeal Phase Impaired Pharyngeal- Pudding Teaspoon -- Pharyngeal -- Pharyngeal- Pudding Cup -- Pharyngeal -- Pharyngeal- Honey Teaspoon -- Pharyngeal -- Pharyngeal- Honey Cup -- Pharyngeal -- Pharyngeal- Nectar Teaspoon Reduced pharyngeal peristalsis;Pharyngeal residue - valleculae;Pharyngeal residue - pyriform;Delayed swallow initiation-pyriform sinuses Pharyngeal Material does not enter airway Pharyngeal- Nectar Cup Delayed swallow initiation-pyriform sinuses;Reduced pharyngeal peristalsis;Pharyngeal residue - valleculae;Pharyngeal residue - pyriform;Penetration/Aspiration during swallow;Significant aspiration (Amount) Pharyngeal Material enters airway, passes BELOW cords without attempt by patient to eject out (silent aspiration) Pharyngeal- Nectar Straw -- Pharyngeal -- Pharyngeal- Thin Teaspoon Delayed swallow initiation-pyriform sinuses;Reduced pharyngeal peristalsis;Pharyngeal residue - valleculae;Pharyngeal residue - pyriform Pharyngeal Material does not enter airway Pharyngeal- Thin Cup Delayed swallow initiation-pyriform sinuses;Reduced pharyngeal peristalsis;Reduced laryngeal elevation;Reduced airway/laryngeal closure Pharyngeal Material enters airway, passes BELOW cords without attempt by patient to eject out (silent aspiration) Pharyngeal- Thin Straw Delayed swallow initiation-pyriform sinuses;Reduced pharyngeal peristalsis;Reduced laryngeal elevation;Reduced airway/laryngeal closure;Significant aspiration (Amount) Pharyngeal Material enters airway, passes BELOW cords without attempt by patient to eject  out (silent aspiration);Material enters airway, passes BELOW cords and not ejected out despite cough attempt by patient Pharyngeal- Puree Pharyngeal residue - valleculae;Reduced pharyngeal peristalsis;Reduced laryngeal elevation;Reduced airway/laryngeal closure Pharyngeal Material does not enter airway Pharyngeal- Mechanical Soft Pharyngeal residue - valleculae;Reduced tongue base retraction;Reduced pharyngeal peristalsis;Reduced laryngeal elevation;Reduced airway/laryngeal closure Pharyngeal Material does not enter airway Pharyngeal- Regular -- Pharyngeal -- Pharyngeal- Multi-consistency -- Pharyngeal -- Pharyngeal- Pill -- Pharyngeal -- Pharyngeal Comment --  Cervical Esophageal Phase  11/22/2021 Cervical Esophageal Phase Impaired Pudding Teaspoon -- Pudding Cup -- Honey Teaspoon -- Honey Cup -- Nectar Teaspoon -- Nectar Cup -- Nectar Straw -- Thin Teaspoon -- Thin Cup -- Thin Straw -- Puree -- Mechanical Soft -- Regular -- Multi-consistency -- Pill -- Cervical Esophageal Comment Upon esophageal sweep, pt appeared with retention and retrograde propulsion without awareness.  MBS does not diagnosis esophageal dysphagia. Daniel Lime, MS [facility] SLP Acute Rehab Services Office [phone] Pager [phone] Daniel Ochoa 11/22/2021, 11:46 AM                     ECHOCARDIOGRAM COMPLETE  Result Date: 11/22/2021    ECHOCARDIOGRAM REPORT   Patient Name:   [facility] [place] Date of Exam: 11/22/2021 Medical Rec #:  [id number]           Height:       73.0 in Accession #:    [id number]          Weight:       241.6 lb Date of Birth:  Nov 04, 1940          BSA:          2.332 m Patient Age:    57 years            BP:           140/39 mmHg Patient Gender: M                   HR:           61 bpm. Exam Location:  Inpatient Procedure: 2D Echo Indications:    Abnormal EKG, Acute respiratory distress  History:        Patient has prior history of Echocardiogram examinations, most                 recent 04/14/2014. Signs/Symptoms:Shortness  of Breath; Risk  Factors:Diabetes and Hypertension.  Sonographer:    Arlyss Gandy Referring Phys: [id number] [place]  Sonographer Comments: Image acquisition challenging due to patient body habitus and supine. IMPRESSIONS  1. Left ventricular ejection fraction, by estimation, is 60 to 65%. The left ventricle has normal function. The left ventricle has no regional wall motion abnormalities. There is mild concentric left ventricular hypertrophy. Left ventricular diastolic parameters are consistent with Grade II diastolic dysfunction (pseudonormalization).  2. Right ventricular systolic function is normal. The right ventricular size is moderately enlarged. There is normal pulmonary artery systolic pressure.  3. Left atrial size was moderately dilated.  4. Right atrial size was mildly dilated.  5. The mitral valve was not well visualized. Trivial mitral valve regurgitation. No evidence of mitral stenosis. Moderate mitral annular calcification.  6. The aortic valve is tricuspid. There is mild calcification of the aortic valve. There is mild thickening of the aortic valve. Aortic valve regurgitation is not visualized. Aortic valve sclerosis is present, with no evidence of aortic valve stenosis. Comparison(s): No significant change from prior study. FINDINGS  Left Ventricle: Left ventricular ejection fraction, by estimation, is 60 to 65%. The left ventricle has normal function. The left ventricle has no regional wall motion abnormalities. The left ventricular internal cavity size was normal in size. There is  mild concentric left ventricular hypertrophy. Left ventricular diastolic parameters are consistent with Grade II diastolic dysfunction (pseudonormalization). Right Ventricle: The right ventricular size is moderately enlarged. Right vetricular wall thickness was not well visualized. Right ventricular systolic function is normal. There is normal pulmonary artery systolic pressure. The tricuspid  regurgitant velocity is 2.39 m/s, and with an assumed right atrial pressure of 8 mmHg, the estimated right ventricular systolic pressure is 01.0 mmHg. Left Atrium: Left atrial size was moderately dilated. Right Atrium: Right atrial size was mildly dilated. Pericardium: There is no evidence of pericardial effusion. Mitral Valve: The mitral valve was not well visualized. There is moderate thickening of the mitral valve leaflet(s). There is moderate calcification of the mitral valve leaflet(s). Moderate mitral annular calcification. Trivial mitral valve regurgitation. No evidence of mitral valve stenosis. MV peak gradient, 5.2 mmHg. The mean mitral valve gradient is 2.0 mmHg. Tricuspid Valve: The tricuspid valve is grossly normal. Tricuspid valve regurgitation is trivial. No evidence of tricuspid stenosis. Aortic Valve: The aortic valve is tricuspid. There is mild calcification of the aortic valve. There is mild thickening of the aortic valve. Aortic valve regurgitation is not visualized. Aortic valve sclerosis is present, with no evidence of aortic valve stenosis. Aortic valve mean gradient measures 3.0 mmHg. Aortic valve peak gradient measures 7.2 mmHg. Aortic valve area, by VTI measures 3.28 cm. Pulmonic Valve: The pulmonic valve was not well visualized. Pulmonic valve regurgitation is not visualized. No evidence of pulmonic stenosis. Aorta: The aortic arch was not well visualized and the aortic root and ascending aorta are structurally normal, with no evidence of dilitation. Venous: The inferior vena cava was not well visualized. IAS/Shunts: The interatrial septum was not well visualized.  LEFT VENTRICLE PLAX 2D LVIDd:         4.80 cm   Diastology LVIDs:         3.05 cm   LV e' medial:    4.68 cm/s LV PW:         1.40 cm   LV E/e' medial:  22.4 LV IVS:        1.40 cm   LV e' lateral:   7.94 cm/s LVOT diam:  2.30 cm   LV E/e' lateral: 13.2 LV SV:         96 LV SV Index:   41 LVOT Area:     4.15 cm  RIGHT  VENTRICLE RV Basal diam:  5.00 cm RV Mid diam:    4.70 cm RV S prime:     12.40 cm/s TAPSE (M-mode): 2.0 cm LEFT ATRIUM             Index        RIGHT ATRIUM           Index LA diam:        4.00 cm 1.72 cm/m   RA Area:     18.80 cm LA Vol (A2C):   96.5 ml 41.39 ml/m  RA Volume:   56.00 ml  24.02 ml/m LA Vol (A4C):   95.8 ml 41.09 ml/m LA Biplane Vol: 97.5 ml 41.82 ml/m  AORTIC VALVE AV Area (Vmax):    2.95 cm AV Area (Vmean):   3.28 cm AV Area (VTI):     3.28 cm AV Vmax:           134.00 cm/s AV Vmean:          82.000 cm/s AV VTI:            0.294 m AV Peak Grad:      7.2 mmHg AV Mean Grad:      3.0 mmHg LVOT Vmax:         95.30 cm/s LVOT Vmean:        64.800 cm/s LVOT VTI:          0.232 m LVOT/AV VTI ratio: 0.79  AORTA Ao Root diam: 3.10 cm Ao Asc diam:  3.50 cm MITRAL VALVE                TRICUSPID VALVE MV Area (PHT): 2.54 cm     TR Peak grad:   22.8 mmHg MV Area VTI:   2.07 cm     TR Vmax:        239.00 cm/s MV Peak grad:  5.2 mmHg MV Mean grad:  2.0 mmHg     SHUNTS MV Vmax:       1.14 m/s     Systemic VTI:  0.23 m MV Vmean:      70.2 cm/s    Systemic Diam: 2.30 cm MV Decel Time: 299 msec MV E velocity: 105.00 cm/s MV A velocity: 78.40 cm/s MV E/A ratio:  1.34 Buford Dresser MD Electronically signed by Buford Dresser MD Signature Date/Time: 11/22/2021/1:46:53 PM    Final    VAS [place] LOWER EXTREMITY VENOUS (DVT)  Result Date: 11/22/2021  Lower Venous DVT Study Patient Name:  [facility] [place]  Date of Exam:   11/22/2021 Medical Rec #: [id number]            Accession #:    [id number] Date of Birth: 1940-09-27           Patient Gender: M Patient Age:   35 years Exam Location:  [facility] Procedure:      VAS [place] LOWER EXTREMITY VENOUS (DVT) Referring Phys: Archie Patten HALL --------------------------------------------------------------------------------  Indications: Pain.  Risk Factors: None identified. Limitations: Poor ultrasound/tissue interface, patient positioning and body habitus.  Comparison Study: No prior studies. Performing Technologist: Oliver Hum RVT  Examination Guidelines: A complete evaluation includes B-mode imaging, spectral Doppler, color Doppler, and power Doppler as needed of all accessible portions of each vessel. Bilateral testing is considered  an integral part of a complete examination. Limited examinations for reoccurring indications may be performed as noted. The reflux portion of the exam is performed with the patient in reverse Trendelenburg.  +---------+---------------+---------+-----------+----------+--------------+  RIGHT     Compressibility Phasicity Spontaneity Properties Thrombus Aging  +---------+---------------+---------+-----------+----------+--------------+  CFV       Full            Yes       Yes                                    +---------+---------------+---------+-----------+----------+--------------+  SFJ       Full                                                             +---------+---------------+---------+-----------+----------+--------------+  FV Prox   Full                                                             +---------+---------------+---------+-----------+----------+--------------+  FV Mid    Full                                                             +---------+---------------+---------+-----------+----------+--------------+  FV Distal Full                                                             +---------+---------------+---------+-----------+----------+--------------+  PFV       Full                                                             +---------+---------------+---------+-----------+----------+--------------+  POP       Full            Yes       Yes                                    +---------+---------------+---------+-----------+----------+--------------+  PTV       Full                                                             +---------+---------------+---------+-----------+----------+--------------+  PERO       Full                                                             +---------+---------------+---------+-----------+----------+--------------+   +---------+---------------+---------+-----------+----------+--------------+  LEFT      Compressibility Phasicity Spontaneity Properties Thrombus Aging  +---------+---------------+---------+-----------+----------+--------------+  CFV       Full            Yes       Yes                                    +---------+---------------+---------+-----------+----------+--------------+  SFJ       Full                                                             +---------+---------------+---------+-----------+----------+--------------+  FV Prox   Full                                                             +---------+---------------+---------+-----------+----------+--------------+  FV Mid    Full                                                             +---------+---------------+---------+-----------+----------+--------------+  FV Distal Full                                                             +---------+---------------+---------+-----------+----------+--------------+  PFV       Full                                                             +---------+---------------+---------+-----------+----------+--------------+  POP       Full            Yes       Yes                                    +---------+---------------+---------+-----------+----------+--------------+  PTV       Full                                                             +---------+---------------+---------+-----------+----------+--------------+  PERO      Full                                                             +---------+---------------+---------+-----------+----------+--------------+  Summary: RIGHT: - There is no evidence of deep vein thrombosis in the lower extremity. However, portions of this examination were limited- see technologist comments above.  - No cystic structure found in  the popliteal fossa.  LEFT: - There is no evidence of deep vein thrombosis in the lower extremity. However, portions of this examination were limited- see technologist comments above.  - No cystic structure found in the popliteal fossa.  *See table(s) above for measurements and observations. Electronically signed by Monica Martinez MD on 11/22/2021 at 1:52:01 PM.    Final    VAS [place] UPPER EXTREMITY VENOUS DUPLEX  Result Date: 11/23/2021 UPPER VENOUS STUDY  Patient Name:  [facility] [place]  Date of Exam:   11/23/2021 Medical Rec #: [id number]            Accession #:    [id number] Date of Birth: [birth year]           Patient Gender: M Patient Age:   65 years Exam Location:  [facility] Procedure:      VAS [place] UPPER EXTREMITY VENOUS DUPLEX Referring Phys: Irene Pap --------------------------------------------------------------------------------  Indications: Edema Risk Factors: None identified. Limitations: Poor ultrasound/tissue interface and patient positioning. Comparison Study: No prior studies. Performing Technologist: Oliver Hum RVT  Examination Guidelines: A complete evaluation includes B-mode imaging, spectral Doppler, color Doppler, and power Doppler as needed of all accessible portions of each vessel. Bilateral testing is considered an integral part of a complete examination. Limited examinations for reoccurring indications may be performed as noted.  Right Findings: +----------+------------+---------+-----------+----------+-----------------+  RIGHT      Compressible Phasicity Spontaneous Properties      Summary       +----------+------------+---------+-----------+----------+-----------------+  IJV            Full        Yes        Yes                                   +----------+------------+---------+-----------+----------+-----------------+  Subclavian     Full        Yes        Yes                                    +----------+------------+---------+-----------+----------+-----------------+  Axillary       Full        Yes        Yes                                   +----------+------------+---------+-----------+----------+-----------------+  Brachial       Full        Yes        Yes                                   +----------+------------+---------+-----------+----------+-----------------+  Radial         Full                                                         +----------+------------+---------+-----------+----------+-----------------+  Ulnar          Full                                                         +----------+------------+---------+-----------+----------+-----------------+  Cephalic       Full                                                         +----------+------------+---------+-----------+----------+-----------------+  Basilic        None                                      Age Indeterminate  +----------+------------+---------+-----------+----------+-----------------+ Superficial thrombus detected in the basilic vein is noted to begin in the mid upper arm and extend to the distal forearm.  Left Findings: +----------+------------+---------+-----------+----------+-------+  LEFT       Compressible Phasicity Spontaneous Properties Summary  +----------+------------+---------+-----------+----------+-------+  IJV            Full        Yes        Yes                         +----------+------------+---------+-----------+----------+-------+  Subclavian     Full        Yes        Yes                         +----------+------------+---------+-----------+----------+-------+  Axillary       Full        Yes        Yes                         +----------+------------+---------+-----------+----------+-------+  Brachial       Full        Yes        Yes                         +----------+------------+---------+-----------+----------+-------+  Radial         Full                                                +----------+------------+---------+-----------+----------+-------+  Ulnar          Full                                               +----------+------------+---------+-----------+----------+-------+  Cephalic       None                                       Acute   +----------+------------+---------+-----------+----------+-------+  Basilic  Full                                               +----------+------------+---------+-----------+----------+-------+ Superficial thrombus detected in the cephalic vein is noted to be only in the mid forearm.  Summary:  Right: No evidence of deep vein thrombosis in the upper extremity. Findings consistent with age indeterminate superficial vein thrombosis involving the right basilic vein.  Left: No evidence of deep vein thrombosis in the upper extremity. Findings consistent with acute superficial vein thrombosis involving the left cephalic vein.  *See table(s) above for measurements and observations.    Preliminary      Assessment and Plan:   Acute SOB/Hypoxia/ PNA most likely aspiration on ABX and 02 --has normal EF no RV strain. But elevated ddimer.  Unable to lie flat for VQ and Cr elevated unable to do CTA.  Placed on full strength lovenox.  Hx of PE in 2015 and on coumadin for about 6 months  Elevated troponin 65 due to demand ischemia with current condition would not eva with ischemic eval.  Currently. Hx of brief 1 episode of a fib in past on sleep study one episode of SVT on tele last month with TIA - pt's P waves are difficult to determine at times. Elevated ddimer now 6 with superficial vein thrombus of Lt cephalic vein.  Now on full does lovenox.   CKD-4 with fistula. No HD yet HTN stable holding amlodipine and atenolol 50 BID, BP soft at one point but would resume amlodipine.  OSA/dementia/hypothyroidism/DM-2 per IM Anemia hgb 10.4    Risk Assessment/Risk Scores:                For questions or updates, please contact [place] Please consult [email or web address] for contact info under    Signed, Cecilie Kicks, NP  11/23/2021 11:47 AM

## 2021-11-23 NOTE — Progress Notes (Signed)
ANTICOAGULATION CONSULT NOTE  ? ?Pharmacy Consult for Lovenox ?Indication: Suspected PE ? ?Allergies  ?Allergen Reactions  ? Statins Other (See Comments)  ?  Elevated CK level  ? Lisinopril   ?  Other reaction(s): stopped by pulmonology  to help with breathing  ? Latex Rash  ?  Severe blistery rash  ? ? ?Patient Measurements: ?Height: '6\' 1"'$  (185.4 cm) ?Weight: 109.6 kg (241 lb 10 oz) ?IBW/kg (Calculated) : 79.9 ?Heparin Dosing Weight:  ? ?Vital Signs: ?Temp: 97.9 ?F (36.6 ?C) (03/09 0400) ?Temp Source: Axillary (03/09 0400) ?BP: 147/30 (03/09 0600) ?Pulse Rate: 70 (03/09 0604) ? ?Labs: ?Recent Labs  ?  12/07/2021 ?1050 12/05/2021 ?1742 11/22/21 ?7425 11/23/21 ?0306  ?HGB 11.6*  --  12.0* 10.4*  ?HCT 36.0*  --  38.4* 33.0*  ?PLT 416*  --  397 404*  ?APTT 27  --   --   --   ?LABPROT 14.3  --   --   --   ?INR 1.1  --   --   --   ?CREATININE 2.21*  --  2.46* 2.65*  ?TROPONINIHS 41* 65*  --   --   ? ? ? ?Estimated Creatinine Clearance: 29.3 mL/min (A) (by C-G formula based on SCr of 2.65 mg/dL (H)). ? ? ?Medications:  ?Scheduled:  ? albuterol  2.5 mg Nebulization TID  ? vitamin C  500 mg Oral Daily  ? atenolol  12.5 mg Oral BID  ? chlorhexidine  15 mL Mouth Rinse BID  ? Chlorhexidine Gluconate Cloth  6 each Topical Daily  ? [START ON 11/24/2021] enoxaparin (LOVENOX) injection  110 mg Subcutaneous Q24H  ? famotidine  40 mg Oral QHS  ? insulin aspart  0-15 Units Subcutaneous TID WC  ? insulin aspart  0-5 Units Subcutaneous QHS  ? insulin aspart  3 Units Subcutaneous TID WC  ? levothyroxine  200 mcg Oral Once per day on Mon Tue Wed Thu Fri Sat  ? [START ON 11/26/2021] levothyroxine  300 mcg Oral Once per day on Sun  ? mouth rinse  15 mL Mouth Rinse BID  ? ?Infusions:  ? sodium chloride 10 mL/hr at 11/22/21 1010  ? meropenem (MERREM) IV Stopped (11/22/21 2330)  ? ? ?Assessment: ?26 yoM presented on 3/7 with SOB, HCAP, concern for aspiration.  PMH of anticoagulation for history of DVT/PE, but no longer taking any oral  anticoagulation.  He was started on Lovenox '40mg'$  daily for VTE prophylaxis on admission.  Ddimer 4.7, Dopplers negative for DVT, limited exam.  Pharmacy is now consulted to dose Lovenox for possible PE.   ? ?Today, 11/23/2021:  ?SCr increased to 2.65, CrCl ~ 29 ml/min.  (Baseline CKD-IV) ?CBC: Hgb low/decreased to 10.4 (near baseline), Plt elevated ? ?Goal of Therapy:  ?Anti-Xa level 0.6-1 units/ml 4hrs after LMWH dose given ?Monitor platelets by anticoagulation protocol: Yes ?  ?Plan:  ?Lovenox 1 mg/kg (110 mg) SQ decreased to Q24h ?Follow up renal function, VTE testing, and CBC ? ? ?Gretta Arab PharmD, BCPS ?Clinical Pharmacist ?[facility] [phone] ?11/23/2021 7:10 AM ? ? ?

## 2021-11-23 NOTE — Progress Notes (Signed)
Patient seen and examined. On 12L [place], no distress. Wife feeding him. Peripheral edema. No rhonchi or wheezing. ? ?CXR reviewed- possible pulm edema, esp RUL and LLL pneumonia ? ?Plan: ?Lasix  ?Nocturnal bipap ?CT non-urgent as we are already treating empirically for pneumonia, PE, and pulm edema. Should travel on bipap for his comfort since he hasn't been able to lie flat. ?DNR is appropriate.  ? ?Full consult note to follow. ? ?Julian Hy, DO 11/23/21 7:24 PM ?[facility] ? ?

## 2021-11-24 ENCOUNTER — Inpatient Hospital Stay (HOSPITAL_COMMUNITY): Payer: Medicare Other

## 2021-11-24 DIAGNOSIS — I82612 Acute embolism and thrombosis of superficial veins of left upper extremity: Secondary | ICD-10-CM

## 2021-11-24 DIAGNOSIS — J9601 Acute respiratory failure with hypoxia: Secondary | ICD-10-CM | POA: Diagnosis not present

## 2021-11-24 LAB — CBC WITH DIFFERENTIAL/PLATELET
Abs Immature Granulocytes: 0.25 10*3/uL — ABNORMAL HIGH (ref 0.00–0.07)
Basophils Absolute: 0.1 10*3/uL (ref 0.0–0.1)
Basophils Relative: 0 %
Eosinophils Absolute: 0.3 10*3/uL (ref 0.0–0.5)
Eosinophils Relative: 1 %
HCT: 33.8 % — ABNORMAL LOW (ref 39.0–52.0)
Hemoglobin: 10.6 g/dL — ABNORMAL LOW (ref 13.0–17.0)
Immature Granulocytes: 1 %
Lymphocytes Relative: 4 %
Lymphs Abs: 0.9 10*3/uL (ref 0.7–4.0)
MCH: 29.1 pg (ref 26.0–34.0)
MCHC: 31.4 g/dL (ref 30.0–36.0)
MCV: 92.9 fL (ref 80.0–100.0)
Monocytes Absolute: 1.7 10*3/uL — ABNORMAL HIGH (ref 0.1–1.0)
Monocytes Relative: 7 %
Neutro Abs: 19.3 10*3/uL — ABNORMAL HIGH (ref 1.7–7.7)
Neutrophils Relative %: 87 %
Platelets: 378 10*3/uL (ref 150–400)
RBC: 3.64 MIL/uL — ABNORMAL LOW (ref 4.22–5.81)
RDW: 14.2 % (ref 11.5–15.5)
WBC: 22.5 10*3/uL — ABNORMAL HIGH (ref 4.0–10.5)
nRBC: 0 % (ref 0.0–0.2)

## 2021-11-24 LAB — GLUCOSE, CAPILLARY
Glucose-Capillary: 215 mg/dL — ABNORMAL HIGH (ref 70–99)
Glucose-Capillary: 231 mg/dL — ABNORMAL HIGH (ref 70–99)
Glucose-Capillary: 248 mg/dL — ABNORMAL HIGH (ref 70–99)
Glucose-Capillary: 255 mg/dL — ABNORMAL HIGH (ref 70–99)
Glucose-Capillary: 331 mg/dL — ABNORMAL HIGH (ref 70–99)
Glucose-Capillary: 333 mg/dL — ABNORMAL HIGH (ref 70–99)

## 2021-11-24 LAB — HEPARIN LEVEL (UNFRACTIONATED)
Heparin Unfractionated: <0.1 IU/mL — ABNORMAL LOW (ref 0.30–0.70)
Heparin Unfractionated: 0.13 IU/mL — ABNORMAL LOW (ref 0.30–0.70)

## 2021-11-24 LAB — COMPREHENSIVE METABOLIC PANEL
ALT: 11 U/L (ref 0–44)
AST: 14 U/L — ABNORMAL LOW (ref 15–41)
Albumin: 2.7 g/dL — ABNORMAL LOW (ref 3.5–5.0)
Alkaline Phosphatase: 55 U/L (ref 38–126)
Anion gap: 12 (ref 5–15)
BUN: 101 mg/dL — ABNORMAL HIGH (ref 8–23)
CO2: 24 mmol/L (ref 22–32)
Calcium: 8.6 mg/dL — ABNORMAL LOW (ref 8.9–10.3)
Chloride: 107 mmol/L (ref 98–111)
Creatinine, Ser: 2.7 mg/dL — ABNORMAL HIGH (ref 0.61–1.24)
GFR, Estimated: 23 mL/min — ABNORMAL LOW (ref >60)
Glucose, Bld: 260 mg/dL — ABNORMAL HIGH (ref 70–99)
Potassium: 3.9 mmol/L (ref 3.5–5.1)
Sodium: 143 mmol/L (ref 135–145)
Total Bilirubin: 0.5 mg/dL (ref 0.3–1.2)
Total Protein: 6 g/dL — ABNORMAL LOW (ref 6.5–8.1)

## 2021-11-24 LAB — D-DIMER, QUANTITATIVE: D-Dimer, Quant: 4.54 ug/mL-FEU — ABNORMAL HIGH (ref 0.00–0.50)

## 2021-11-24 LAB — MAGNESIUM: Magnesium: 2.4 mg/dL (ref 1.7–2.4)

## 2021-11-24 LAB — PHOSPHORUS: Phosphorus: 3.9 mg/dL (ref 2.5–4.6)

## 2021-11-24 LAB — BRAIN NATRIURETIC PEPTIDE: B Natriuretic Peptide: 225.6 pg/mL — ABNORMAL HIGH (ref 0.0–100.0)

## 2021-11-24 LAB — PROCALCITONIN: Procalcitonin: 0.36 ng/mL

## 2021-11-24 MED ORDER — HEPARIN BOLUS VIA INFUSION
3000.0000 [IU] | INTRAVENOUS | Status: AC
  Administered 2021-11-24: 3000 [IU] via INTRAVENOUS
  Filled 2021-11-24: qty 3000

## 2021-11-24 MED ORDER — ORAL CARE MOUTH RINSE
15.0000 mL | Freq: Two times a day (BID) | OROMUCOSAL | Status: DC
  Administered 2021-11-24 – 2021-11-25 (×3): 15 mL via OROMUCOSAL

## 2021-11-24 MED ORDER — INSULIN ASPART 100 UNIT/ML IJ SOLN
0.0000 [IU] | Freq: Three times a day (TID) | INTRAMUSCULAR | Status: DC
  Administered 2021-11-25: 15 [IU] via SUBCUTANEOUS
  Administered 2021-11-25: 7 [IU] via SUBCUTANEOUS

## 2021-11-24 MED ORDER — HEPARIN (PORCINE) 25000 UT/250ML-% IV SOLN: 1950.0000 [IU]/h | INTRAVENOUS | Status: DC

## 2021-11-24 MED ORDER — INSULIN ASPART 100 UNIT/ML IJ SOLN
0.0000 [IU] | Freq: Every day | INTRAMUSCULAR | Status: DC
  Administered 2021-11-24: 4 [IU] via SUBCUTANEOUS

## 2021-11-24 MED ORDER — SODIUM CHLORIDE 0.9 % IV SOLN: INTRAVENOUS | Status: DC | PRN

## 2021-11-24 MED ORDER — HEPARIN (PORCINE) 25000 UT/250ML-% IV SOLN
1950.0000 [IU]/h | INTRAVENOUS | Status: DC
  Administered 2021-11-24: 1650 [IU]/h via INTRAVENOUS
  Administered 2021-11-25: 1950 [IU]/h via INTRAVENOUS
  Filled 2021-11-24 (×2): qty 250

## 2021-11-24 NOTE — Progress Notes (Signed)
PROGRESS NOTE  Daniel Ochoa JOA:[id number] DOB: [birth year] DOA: 11/17/2021 PCP: Donnajean Lopes, MD  HPI/Recap of past 24 hours: Daniel Ochoa is a 81 y.o. male with medical history significant of DVT, pulmonary embolism off oral anticoagulation, osteoarthritis, stage 3B chronic kidney disease, type 2 diabetes, diabetic peripheral neuropathy, constipation, hypertension, fibromyalgia, hypothyroidism, obesity, OSA on CPAP, unspecified skin cancer who  was recently discharged from the hospital due to TIA, E. coli UTI, RUE edema, and is returning to [facility] ED due to shortness of breath, hypoxia, productive cough, and generalized weakness.  Work-up revealed left lower lobe HCAP, elevated troponin, as well as concern for aspiration.  No anginal symptoms.  Speech therapist evaluated, post MBS on 11/22/2021, recommended dysphagia 1 diet, pure solids and nectar thick liquids.  On admission was started on empiric IV antibiotics meropenem and IV vancomycin.  MRSA screening test was negative therefore IV vancomycin was discontinued.  Due to worsening clinical picture, ID, pulmonary and cardiology have been consulted and are following.  11/24/2021: Patient was seen and examined at his bedside.  He has no new complaints.  Denies having any pain.  With new dysphagia and concern for possible recent CVA, we will obtain a CT head without contrast.  CKD precludes the use of IV contrast to evaluate his head and neck vasculature to rule out LVO.  Assessment/Plan: Principal Problem:   Acute respiratory failure with hypoxia (HCC) Active Problems:   Diabetes mellitus type 2 with complications (HCC)   Essential hypertension   OSA on CPAP   Chronic kidney disease, stage IV (severe) (HCC)   Alzheimer's disease ([place])   Hypothyroidism   Class 1 obesity  Persistent acute respiratory failure with hypoxia (HCC) secondary to multifocal HCAP with concern for superimposed aspiration pneumonia, POA Admission chest  x-ray suspicious for left lower lobe HCAP, POA. Repeated chest x-ray done on 11/23/2021 showing improvement of left lung infiltrates however showing worsening right side pulmonary infiltrates.   Continue empiric IV antibiotics meropenem, ID consulted 11/23/2021. Follow sputum culture ordered, blood cultures x2 negative to date. Negative MRSA screening test, IV vancomycin DC'd on 11/22/2021 WBC and procalcitonin are downtrending Start incentive spirometer, flutter valve Maintain O2 saturation greater than 92%. Currently on 12L [facility], wean off oxygen supplementation as tolerated. Out of bed to chair every shift, mobilize as tolerated. Upright position with head of bed above 90 degrees during feedings.  Multifocal HCAP with concern for aspiration, POA. Failed swallow evaluation at bedside MBS completed on 11/22/2021 recommendation for dysphagia 1 diet, pure solids and nectar thick liquids.  Continue aspiration precautions Continue IV antibiotics empirically. Continue pulmonary toilet, bronchodilators Upright position with head of bed above 90 degrees during feedings to reduce risk of aspiration.  New dysphagia with concern for recent stroke Evaluated by speech therapist, post MBS on 11/22/2021, recommended dysphagia 1 diet, pure solids and nectar thick liquids.  Per his wife at bedside at home was on solid diet previously. Obtain CT head on 11/24/2021 CKD precludes the use of IV contrast to evaluate his head and neck vasculature to rule out LVO. Continue aspiration precautions Continue management as stated above Followed by speech therapist, continue therapies  Bilateral upper extremity superficial venous thrombosis seen on Doppler ultrasound done on 11/23/2021. Findings consistent with age indeterminate superficial vein thrombosis involving the right basilic vein.  Findings consistent with acute superficial vein thrombosis involving the left cephalic vein.  D-dimer elevated and uptrending 6.18 from  4.72.  Started on full dose Lovenox on  11/22/2021, switched to heparin drip on 11/23/2021 due to concern for pulmonary embolism. D-dimer downtrending  Presumptive pulmonary embolism Unable to obtain a CT angio chest due to renal insufficiency Unable to obtain a VQ scan due to inability to lay down flat We will continue heparin drip for now for 24 hours then switch to Eliquis possibly on 12-12-21.  Recent TIA Recent admission for TIA 11/10/2021, discharged on 11/17/2021, was unable to obtain imaging of head and neck vessels due to renal insufficiency precluding use of IV contrast for CT angio head and neck, and inability to lay down flat for MRI, MRA head and neck.  Elevated troponin suspect demand ischemia in the setting of severe hypoxia High-sensitivity troponin elevated 65 with nonspecific ST-T changes. Denies any chest pain Abnormal 2D echo done on 11/22/2021.  Seen by cardiology.  Appreciate recommendations.  Continue to monitor with telemetry.  Acute on chronic CKD 3B Baseline creatinine appears to be 2.23 with GFR of 30. Creatinine is uptrending 2.65 with GFR 24. Continue to avoid nephrotoxic agents and hypotension. Monitor urine output with strict I's and O's Repeat renal function test in the morning.  Moderate protein calorie malnutrition Albumin 2.8 Moderate muscle mass loss Dietitian consulted to assist with nutritional status Feeding assistance  Chronic diastolic CHF Abnormal findings on 2D echo done on 11/22/2021. LVEF 60 to 65% with grade 2 diastolic dysfunction, the right ventricular size is moderately enlarged. Euvolemic on exam Home diuretics held, defer to cardiology to restart On atenolol 12.5 mg twice daily Continue strict I's and O's and daily weight  Essential hypertension BP is not at goal, elevated. Restarted home Norvasc 10 mg daily Home beta-blocker due to severe hypoxemia and worsening pulmonary infiltrates Continue to closely monitor vital  signs  Prediabetes with hyperglycemia (HCC) Hemoglobin A 6.0% on 11/10/2021. Carbohydrate modified diet. Continue insulin sliding scale.     OSA on CPAP Continue CPAP/BiPAP at bedtime.     Hypothyroidism TSH 1.043 on 12/02/2021. Continue Synthroid 300 mg p.o. daily.    Chronic anxiety Resume home regimen Clonazepam as needed for anxiety.     Class 1 obesity BMI 31 Lifestyle modifications as an outpatient.  Physical debility PT OT recommending SNF Continue therapies with fall precautions. Out of bed to chair with every shift as tolerated.     Critical care time: 65 minutes.      Advance Care Planning:   Code Status: Full Code    Consults: Cardiology, infectious disease, pulmonary.   Family Communication: Updated wife at bedside.   Severity of Illness:       Status is: Inpatient Patient requires at least 2 midnights for further evaluation and treatment of present condition.    Objective: Vitals:   11/24/21 1300 11/24/21 1440 11/24/21 1516 11/24/21 1600  BP: (!) 143/42   (!) 159/42  Pulse: 72   82  Resp: (!) 24   (!) 24  Temp:   98.2 F (36.8 C)   TempSrc:   Oral   SpO2: 93% 93%  94%  Weight:      Height:        Intake/Output Summary (Last 24 hours) at 11/24/2021 1642 Last data filed at 11/24/2021 1628 Gross per 24 hour  Intake 567.21 ml  Output 700 ml  Net -132.79 ml   Filed Weights   12/13/2021 1135 11/20/2021 1451  Weight: 111.6 kg 109.6 kg    Exam:  General: 81 y.o. year-old male frail-appearing in no acute distress.  He is alert.  On  heated high flow nasal cannula.   Cardiovascular: Regular rate and rhythm no rubs or gallops. Respiratory: Mild rales at bases.  No wheezing noted.  Poor inspiratory effort.   Abdomen: Soft monitor normal bowel sounds present. Musculoskeletal: Trace upper and lower extremity edema bilaterally.   Skin: No ulcerative lesions noted. Psychiatry: Mood is appropriate for condition and setting. Neuro: Alert and  awake.   Data Reviewed: CBC: Recent Labs  Lab 12/12/2021 1050 11/22/21 0315 11/23/21 0306 11/24/21 0303  WBC 17.3* 29.8* 20.7* 22.5*  NEUTROABS 14.2* 26.9* 17.4* 19.3*  HGB 11.6* 12.0* 10.4* 10.6*  HCT 36.0* 38.4* 33.0* 33.8*  MCV 91.8 93.9 92.7 92.9  PLT 416* 397 404* 725   Basic Metabolic Panel: Recent Labs  Lab 11/18/2021 1050 11/22/21 0315 11/23/21 0306 11/24/21 0303  NA 137 142 143 143  K 3.2* 4.2 3.6 3.9  CL 103 104 107 107  CO2 '26 26 26 24  '$ GLUCOSE 285* 291* 214* 260*  BUN 82* 78* 91* 101*  CREATININE 2.21* 2.46* 2.65* 2.70*  CALCIUM 8.3* 9.0 8.8* 8.6*  MG  --   --  2.4 2.4  PHOS  --   --  4.6 3.9   GFR: Estimated Creatinine Clearance: 28.8 mL/min (A) (by C-G formula based on SCr of 2.7 mg/dL (H)). Liver Function Tests: Recent Labs  Lab 12/09/2021 1050 11/22/21 0315 11/23/21 0306 11/24/21 0303  AST 16 14* 13* 14*  ALT '16 13 11 11  '$ ALKPHOS 57 51 53 55  BILITOT 0.2* 0.5 0.3 0.5  PROT 6.2* 6.8 6.4* 6.0*  ALBUMIN 2.3* 3.2* 2.8* 2.7*   No results for input(s): LIPASE, AMYLASE in the last 168 hours. No results for input(s): AMMONIA in the last 168 hours. Coagulation Profile: Recent Labs  Lab 11/27/2021 1050  INR 1.1   Cardiac Enzymes: No results for input(s): CKTOTAL, CKMB, CKMBINDEX, TROPONINI in the last 168 hours. BNP (last 3 results) No results for input(s): PROBNP in the last 8760 hours. HbA1C: No results for input(s): HGBA1C in the last 72 hours. CBG: Recent Labs  Lab 11/23/21 2246 11/24/21 0337 11/24/21 0739 11/24/21 1153 11/24/21 1557  GLUCAP 286* 255* 248* 215* 231*   Lipid Profile: No results for input(s): CHOL, HDL, LDLCALC, TRIG, CHOLHDL, LDLDIRECT in the last 72 hours. Thyroid Function Tests: No results for input(s): TSH, T4TOTAL, FREET4, T3FREE, THYROIDAB in the last 72 hours.  Anemia Panel: No results for input(s): VITAMINB12, FOLATE, FERRITIN, TIBC, IRON, RETICCTPCT in the last 72 hours. Urine analysis:    Component Value  Date/Time   COLORURINE STRAW (A) 12/02/2021 1050   APPEARANCEUR CLEAR 11/19/2021 1050   LABSPEC 1.009 12/05/2021 1050   PHURINE 5.0 11/29/2021 1050   GLUCOSEU 50 (A) 11/29/2021 1050   HGBUR NEGATIVE 12/07/2021 1050   BILIRUBINUR NEGATIVE 12/12/2021 1050   KETONESUR NEGATIVE 12/03/2021 1050   PROTEINUR NEGATIVE 11/20/2021 1050   UROBILINOGEN 0.2 04/13/2014 2334   NITRITE NEGATIVE 12/12/2021 1050   LEUKOCYTESUR NEGATIVE 12/14/2021 1050   Sepsis Labs: '@LABRCNTIP'$ (procalcitonin:4,lacticidven:4)  ) Recent Results (from the past 240 hour(s))  Blood Culture (routine x 2)     Status: None (Preliminary result)   Collection Time: 11/20/2021 10:50 AM   Specimen: BLOOD  Result Value Ref Range Status   Specimen Description   Final    BLOOD BLOOD LEFT WRIST Performed at [facility], [place] [address] [place], [place] [address]    Special Requests   Final    BOTTLES DRAWN AEROBIC AND ANAEROBIC Blood Culture results  may not be optimal due to an excessive volume of blood received in culture bottles Performed at [facility] [address], [place], [place] [address]    Culture   Final    NO GROWTH 3 DAYS Performed at [facility], [place] [address] [place], [place] [address]    Report Status PENDING  Incomplete  Resp Panel by RT-PCR (Flu A&B, Covid) Nasopharyngeal Swab     Status: None   Collection Time: 11/19/2021 10:51 AM   Specimen: Nasopharyngeal Swab; Nasopharyngeal(NP) swabs in vial transport medium  Result Value Ref Range Status   SARS Coronavirus 2 by RT PCR NEGATIVE NEGATIVE Final    Comment: (NOTE) SARS-CoV-2 target nucleic acids are NOT DETECTED.  The SARS-CoV-2 RNA is generally detectable in upper respiratory specimens during the acute phase of infection. The lowest concentration of SARS-CoV-2 viral copies this assay can detect is 138 copies/mL. A negative result does not preclude SARS-Cov-2 infection and should not be used as the sole  basis for treatment or other patient management decisions. A negative result may occur with  improper specimen collection/handling, submission of specimen other than nasopharyngeal swab, presence of viral mutation(s) within the areas targeted by this assay, and inadequate number of viral copies(<138 copies/mL). A negative result must be combined with clinical observations, patient history, and epidemiological information. The expected result is Negative.  Fact Sheet for Patients:  EntrepreneurPulse.com.au  Fact Sheet for Healthcare Providers:  IncredibleEmployment.be  This test is no t yet approved or cleared by the [place] FDA and  has been authorized for detection and/or diagnosis of SARS-CoV-2 by FDA under an Emergency Use Authorization (EUA). This EUA will remain  in effect (meaning this test can be used) for the duration of the COVID-19 declaration under Section 564(b)(1) of the Act, 21 U.S.C.section 360bbb-3(b)(1), unless the authorization is terminated  or revoked sooner.       Influenza A by PCR NEGATIVE NEGATIVE Final   Influenza B by PCR NEGATIVE NEGATIVE Final    Comment: (NOTE) The Xpert Xpress SARS-CoV-2/FLU/RSV plus assay is intended as an aid in the diagnosis of influenza from Nasopharyngeal swab specimens and should not be used as a sole basis for treatment. Nasal washings and aspirates are unacceptable for Xpert Xpress SARS-CoV-2/FLU/RSV testing.  Fact Sheet for Patients: EntrepreneurPulse.com.au  Fact Sheet for Healthcare Providers: IncredibleEmployment.be  This test is not yet approved or cleared by the [place] FDA and has been authorized for detection and/or diagnosis of SARS-CoV-2 by FDA under an Emergency Use Authorization (EUA). This EUA will remain in effect (meaning this test can be used) for the duration of the COVID-19 declaration under Section 564(b)(1) of the Act,  21 U.S.C. section 360bbb-3(b)(1), unless the authorization is terminated or revoked.  Performed at [facility], [place] [address], [place], [place] [address]   Blood Culture (routine x 2)     Status: None (Preliminary result)   Collection Time: 11/29/2021 10:55 AM   Specimen: BLOOD  Result Value Ref Range Status   Specimen Description   Final    BLOOD LEFT ANTECUBITAL Performed at [facility] [address], [place], [place] [address]    Special Requests   Final    BOTTLES DRAWN AEROBIC AND ANAEROBIC Blood Culture results may not be optimal due to an excessive volume of blood received in culture bottles Performed at [place] [address] [place], [place] [address]    Culture   Final  NO GROWTH 3 DAYS Performed at [facility], [place] [address] [place], [place] [address]    Report Status PENDING  Incomplete  MRSA Next Gen by PCR, Nasal     Status: None   Collection Time: 12/08/2021  2:05 PM   Specimen: Nasal Mucosa; Nasal Swab  Result Value Ref Range Status   MRSA by PCR Next Gen NOT DETECTED NOT DETECTED Final    Comment: (NOTE) The GeneXpert MRSA Assay (FDA approved for NASAL specimens only), is one component of a comprehensive MRSA colonization surveillance program. It is not intended to diagnose MRSA infection nor to guide or monitor treatment for MRSA infections. Test performance is not FDA approved in patients less than 33 years old. Performed at [facility], [place] [address], [place], [place] [address]       Studies: No results found.  Scheduled Meds:  albuterol  2.5 mg Nebulization TID   amLODipine  10 mg Oral Daily   vitamin C  500 mg Oral Daily   Chlorhexidine Gluconate Cloth  6 each Topical Daily   famotidine  10 mg Oral QHS   feeding supplement (NEPRO CARB STEADY)  237 mL Oral BID BM   insulin aspart  0-15 Units Subcutaneous TID WC   insulin aspart  0-5 Units  Subcutaneous QHS   insulin aspart  3 Units Subcutaneous TID WC   levothyroxine  200 mcg Oral Once per day on Mon Tue Wed Thu Fri Sat   [START ON 11/26/2021] levothyroxine  300 mcg Oral Once per day on Sun   mouth rinse  15 mL Mouth Rinse BID   multivitamin  1 tablet Oral QHS    Continuous Infusions:  sodium chloride 10 mL/hr at 11/22/21 1010   sodium chloride 10 mL/hr at 11/24/21 1053   heparin 1,650 Units/hr (11/24/21 1628)   meropenem (MERREM) IV Stopped (11/24/21 1144)     LOS: 3 days     Kayleen Memos, MD [facility] Pager [phone]  If 7PM-7AM, please contact night-coverage [email or web address] Password [facility] 11/24/2021, 4:42 PM

## 2021-11-24 NOTE — Progress Notes (Signed)
ANTICOAGULATION CONSULT NOTE ? ?Pharmacy Consult for IV Heparin ?Indication: Suspected PE ? ?Allergies  ?Allergen Reactions  ? Statins Other (See Comments)  ?  Elevated CK level  ? Lisinopril   ?  Other reaction(s): stopped by pulmonology  to help with breathing  ? Latex Rash  ?  Severe blistery rash  ? ? ?Patient Measurements: ?Height: '6\' 1"'$  (185.4 cm) ?Weight: 109.6 kg (241 lb 10 oz) ?IBW/kg (Calculated) : 79.9 ?Heparin Dosing Weight: 102.8 kg ? ?Vital Signs: ?Temp: 99.9 ?F (37.7 ?C) (03/10 2048) ?Temp Source: Axillary (03/10 2048) ?BP: 155/39 (03/10 2048) ?Pulse Rate: 81 (03/10 2048) ? ?Labs: ?Recent Labs  ?  11/22/21 ?8250 11/23/21 ?0370 11/24/21 ?0303 11/24/21 ?4888 11/24/21 ?2033  ?HGB 12.0* 10.4* 10.6*  --   --   ?HCT 38.4* 33.0* 33.8*  --   --   ?PLT 397 404* 378  --   --   ?HEPARINUNFRC  --   --   --  <0.10* 0.13*  ?CREATININE 2.46* 2.65* 2.70*  --   --   ? ? ? ?Estimated Creatinine Clearance: 28.8 mL/min (A) (by C-G formula based on SCr of 2.7 mg/dL (H)). ? ? ?Medical History: ?Past Medical History:  ?Diagnosis Date  ? Acute deep vein thrombosis (DVT) of tibial vein of right lower extremity ([place]) 04/13/2014  ? Acute pulmonary embolism ([place]) 04/15/2014  ? Arthritis   ? Chronic fatigue   ? Chronic kidney disease, stage IV (severe) ([place])   ? Complication of anesthesia   ? one surgery took 4 people to hold him down 2000  ? Constipation   ? Diabetic peripheral neuropathy ([place]) 10/26/2015  ? DM (diabetes mellitus), type 2, uncontrolled, with renal complications   ? [facility]  ? DVT (deep venous thrombosis) ([place])   ? RLE DVT with intermediate risk VQ scan for PE 03/2014  ? Dyspnea   ? Essential hypertension   ? Fibromyalgia   ? Hypothyroidism   ? Hypoxia 04/13/2014  ? Neuropathy   ? Obesity   ? OSA on CPAP   ? Pulmonary embolism ([place]) 04/13/2014  ? VQ 04/13/14 intermediate assoc with R DVT > on coumadin since Echo 04/14/14 > PA peak pressure: 89 mm Hg (S).   ? ? ?Medications:  ?Prior to IV heparin, pt was on Lovenox  110 mg subq every 24 hours.  Last Lovenox dose was 110 mg on 3/9 at 0511 ?IV Heparin infusion initiated on 3/10 at 0125 at 1650 units/hr ? ?Assessment: ?22 yoM presented on 3/7 with SOB, HCAP, concern for aspiration.  PMH of anticoagulation for history of DVT/PE, but no longer taking any oral anticoagulation (Warfarin x6 months in 2015).  He was started on Lovenox '40mg'$  daily for VTE prophylaxis on admission.  Ddimer 4.7. Doppler ultrasound 3/9 consistent with acute superficial vein thrombosis involving left cephalic vein as well as right basilic vein.  Pharmacy was initially consulted to dose Lovenox for possible PE on 3/8, and then consulted to transition to Heparin on 3/9.  ? ?Tonight, 11/24/21 ?-Heparin level remains subtherapeutic at 0.13 with heparin infusing at 1650 units/hr ?-No line interruption/issues nor bleeding per nursing ? ? ?Goal of Therapy:  ?Heparin level 0.3-0.7 units/ml ?Monitor platelets by anticoagulation protocol: Yes ?  ?Plan:  ?-Give 3000 unit IV heparin bolus x 1 ?-Increase heparin infusion to 1950 units/hour ?-Check heparin level 8 hours after rate change ?-Monitor daily heparin level, CBC, signs/symptoms of bleeding ? ?Tawnya Crook, PharmD, BCPS ?Clinical Pharmacist ?11/24/2021 9:23 PM ? ? ?

## 2021-11-24 NOTE — Progress Notes (Signed)
? ?NAME:  Nashaun Hillmer, MRN:  [id number], DOB:  August 06, 1941, LOS: 3 ?ADMISSION DATE:  11/26/2021, CONSULTATION DATE:  11/23/2021 ?REFERRING MD:  [place] Crane - [facility], CHIEF COMPLAINT:  Hypoxemia  ? ?History of Present Illness:  ?81 year old man who presented to [facility] 3/7 with SOB. PMHx significant for prior DVT/PE, OA, CKD IV, DM2, OSA on CPAP, dementia, DNR status. Recently admitted for UTI and TIA. ? ?In ED, CXR with L-sided opacities. Empiric HCAP/aspiration PNA coverage was initiated. On 3/9 patient was noted to have increasing O2 needs and was placed on BiPAP. There was concern for possible PE; however, unable to get CTA Chest due to poor renal function and patient unable to tolerate VQ scan due to severe kyphosis. Started on Lovenox for presumptive PE. ABG after BiPAP initiation demonstrated pH 7.47/pCO2 41/pO2 51.  Repeat D-dimer more elevated at 6.18. ? ?PCCM was consulted 3/9PM for increasing respiratory needs. ? ?Pertinent Medical History:  ?Dementia ?OSA ?TIA ?PE ?DVT ?CKD IV ? ?Significant Hospital Events: ?Including procedures, antibiotic start and stop dates in addition to other pertinent events   ?3/7 admitted to [facility] for HCAP  ?3/9 lovenox for presumptive PE ?3/9 BiPAP. Cards consulted for dilated RV. ID was consulted for worse CXR. Abx rec to mero. PCCM consulted for PaO2 51. ?3/10 Slightly improved O2 requirement; 10L [place] HFNC. Intermittently refusing BiPAP due to discomfort r/t kyphosis and anxiety. ? ?Interim History / Subjective:  ?No significant events overnight ?Refused BiPAP due to discomfort/anxiety ?Remains very slow to respond, oriented x 2 (self, hospital) ?Low grade temp (Tmax 100.3) overnight ?Uptrending WBC, continues on meropenem ?Slightly net negative, -567m/24H ? ?Objective:  ?Blood pressure (!) 145/41, pulse 78, temperature 97.7 ?F (36.5 ?C), temperature source Oral, resp. rate 18, height '6\' 1"'$  (1.854 m), weight 109.6 kg, SpO2 97 %. ?   ?FiO2 (%):  [40 %-50 %] 40 %  ? ?Intake/Output  Summary (Last 24 hours) at 11/24/2021 0830 ?Last data filed at 11/24/2021 0600 ?Gross per 24 hour  ?Intake 171.42 ml  ?Output 700 ml  ?Net -528.58 ml  ? ? ?Filed Weights  ? 12/07/2021 1135 11/30/2021 1451  ?Weight: 111.6 kg 109.6 kg  ? ?Physical Examination: ?General: Chronically ill-appearing elderly man, lying in bed, in NAD. ?HEENT: [place]/AT, anicteric sclera, PERRL, moist mucous membranes. ?Neuro: Awake, oriented x 2. Answering questions appropriately but very slow to respond. Responds to verbal stimuli. Following commands intermittently. Moves all 4 extremities spontaneously. Generalized weakness.  ?CV: RRR, no m/g/r. Dilated superficial veins L chest. ?PULM: Breathing even and unlabored on 10L [place] HFNC. Lung fields diminished bilaterally with occasional "snoring" rhonchi, R > L. ?GI: Soft, nontender, nondistended. Normoactive bowel sounds. ?Extremities: Bilateral 1+ symmetric LE edema noted. ?Skin: Warm/dry, no rashes. ? ?Resolved Hospital Problem List:   ? ? ?Assessment & Plan:  ? ?Acute hypoxic respiratory failure due to HCAP, Aspiration PNA. PE is possible. CXR 3/9 worsening R sided PNA. Some improvement in L Lung aeration. ?Concern for pulmonary edema with edema, nodular appearing markings on CXR, elevated BNP. ?History of Alzheimer's dementia with associated dysphagia, POA ?DNR Status ?Hx OSA on CPAP  ?Superficial venous thrombosis - on full dose Lovenox, then changed to heparin gtt. ECHO without RV failure, does show g2dd. On 3/7 trop-I was 41, BNP 195  ?  ?- Continue supplemental O2 support, tolerating [place] HFNC ?- BiPAP PRN + QHS (as tolerated) ?- Patient is DNR; therefore no role for further escalation in care beyond BiPAP (i.e. intubation) ?- Wean O2 for sat >  88% ?- Continue bronchodilators ?- Pulmonary hygiene ?- Continue meropenem ?- Continue heparin gtt for possible PE ?- F/u CT Chest ? ?Best Practice: (right click and "Reselect all SmartList Selections" daily)  ? ?Diet/type: dysphagia diet (see  orders) ?DVT prophylaxis: systemic heparin ?GI prophylaxis: PPI ?Lines: N/A ?Foley:  N/A ?Code Status:  DNR ?Last date of multidisciplinary goals of care discussion [per primary] ? ?Critical care time:   ? ?Lestine Mount, PA-C ?[facility] ?11/24/21 8:31 AM ? ?Please see [email or web address] for pager details. ? ?From 7A-7P if no response, please call [phone] ?After hours, please call ELink [phone] ?

## 2021-11-24 NOTE — Progress Notes (Signed)
Pharmacy Antibiotic Note ? ?Daniel Ochoa is a 81 y.o. male admitted on 11/17/2021. He presented with low oxygen saturation and concern for pneumonia. Patient recently discharged from [facility] on ertapenem daily for UTI, last day of therapy 11/20/21. Pharmacy currently consulted for meropenem dosing for HCAP. ? ?Today, 12/06/2021 ?- Tmax 100.5, WBC remains elevated ?- SCr 2.7  - CKD IV followed outpatient with recent placement of AVF ? ?Plan: ?- Continue Meropenem 1 g q12h based on current renal function ?- Continue to monitor cultures, labs and clinical progress for further antibiotic dosage adjustments or appropriate de-escalation ? ?Height: '6\' 1"'$  (185.4 cm) ?Weight: 109.6 kg (241 lb 10 oz) ?IBW/kg (Calculated) : 79.9 ? ?Temp (24hrs), Avg:99.2 ?F (37.3 ?C), Min:97.7 ?F (36.5 ?C), Max:100.5 ?F (38.1 ?C) ? ?Recent Labs  ?Lab 11/19/2021 ?1050 11/22/21 ?7616 11/23/21 ?0737 11/24/21 ?0303  ?WBC 17.3* 29.8* 20.7* 22.5*  ?CREATININE 2.21* 2.46* 2.65* 2.70*  ?LATICACIDVEN 1.2  --   --   --   ? ?  ?Estimated Creatinine Clearance: 28.8 mL/min (A) (by C-G formula based on SCr of 2.7 mg/dL (H)).   ? ?Allergies  ?Allergen Reactions  ? Statins Other (See Comments)  ?  Elevated CK level  ? Lisinopril   ?  Other reaction(s): stopped by pulmonology  to help with breathing  ? Latex Rash  ?  Severe blistery rash  ? ? ?Antimicrobials this admission: ?Vancomycin 3/7 >> 3/8 ?Meropenem 3/7 >> ? ? ?Microbiology results: ?3/7 BCx: NGTD ?3/7 MRSA PCR: not detected ? ?Thank you for allowing pharmacy to be a part of this patient?s care. ? ?Suzzanne Cloud, PharmD, BCPS ?Clinical Pharmacist ?11/24/2021 10:33 AM ? ? ?

## 2021-11-24 NOTE — Progress Notes (Signed)
Palliative care brief note ? ?I met with Mr. Ellett and his wife last evening. We discussed his desire to continue with current medical interventions and see how he does over the next several days.  Limitation of care is DNR. ? ?We had discussed plan to meet again today around 1130, however, his wife called this morning and she was at the hospital very late and has gone home to rest.  She did not wish to reschedule something today. ? ?I will plan to follow-up to check in on him this weekend. ? ?Micheline Rough, MD ?[facility] ?[phone] ? ?NO CHARGE NOTE ? ? ?

## 2021-11-24 NOTE — Progress Notes (Signed)
Patient refused to go back on Bipap despite education. Increased agitation when talking about this, he is A/O x2 ?

## 2021-11-24 NOTE — Progress Notes (Signed)
Inpatient Diabetes Program Recommendations ? ?AACE/ADA: New Consensus Statement on Inpatient Glycemic Control (2015) ? ?Target Ranges:  Prepandial:   less than 140 mg/dL ?     Peak postprandial:   less than 180 mg/dL (1-2 hours) ?     Critically ill patients:  140 - 180 mg/dL  ? ?Lab Results  ?Component Value Date  ? GLUCAP 248 (H) 11/24/2021  ? HGBA1C 6.0 (H) 11/10/2021  ? ? ?Review of Glycemic Control ? ?  ? Latest Reference Range & Units 11/23/21 07:47 11/23/21 11:30 11/23/21 17:19 11/23/21 22:46 11/24/21 03:37 11/24/21 07:39  ?Glucose-Capillary 70 - 99 mg/dL 236 (H) 243 (H) 271 (H) 286 (H) 255 (H) 248 (H)  ?(H): Data is abnormally high ? ?Diabetes history: DM2 ?Outpatient Diabetes medications: Novolog 0-9 units TID with meals ?Current orders for Inpatient glycemic control: Novolog 0-15 units TID with meals, Novolog 0-5 units QHS, Novolog 3 units TID with meals ?  ?Inpatient Diabetes Program Recommendations:   ?  ?Insulin: Please consider ordering Semglee 10 units Q24H (based on 109.6 kg x 0.1 units). ? ?Will continue to follow while inpatient. ? ?Thank you, ?Reche Dixon, MSN, RN ?Diabetes Coordinator ?Inpatient Diabetes Program ?[phone] (team pager from 8a-5p) ? ? ? ? ? ? ? ?

## 2021-11-24 NOTE — Progress Notes (Addendum)
ANTICOAGULATION CONSULT NOTE - Initial Consult ? ?Pharmacy Consult for IV Heparin ?Indication: Suspected PE ? ?Allergies  ?Allergen Reactions  ? Statins Other (See Comments)  ?  Elevated CK level  ? Lisinopril   ?  Other reaction(s): stopped by pulmonology  to help with breathing  ? Latex Rash  ?  Severe blistery rash  ? ? ?Patient Measurements: ?Height: '6\' 1"'$  (185.4 cm) ?Weight: 109.6 kg (241 lb 10 oz) ?IBW/kg (Calculated) : 79.9 ?Heparin Dosing Weight: 102.8 kg ? ?Vital Signs: ?Temp: 97.7 ?F (36.5 ?C) (03/10 0800) ?Temp Source: Oral (03/10 0800) ?BP: 145/41 (03/10 0600) ?Pulse Rate: 78 (03/10 0600) ? ?Labs: ?Recent Labs  ?  11/23/2021 ?1050 12/05/2021 ?1742 11/22/21 ?3329 11/23/21 ?5188 11/24/21 ?0303 11/24/21 ?0911  ?HGB 11.6*  --  12.0* 10.4* 10.6*  --   ?HCT 36.0*  --  38.4* 33.0* 33.8*  --   ?PLT 416*  --  397 404* 378  --   ?APTT 27  --   --   --   --   --   ?LABPROT 14.3  --   --   --   --   --   ?INR 1.1  --   --   --   --   --   ?HEPARINUNFRC  --   --   --   --   --  <0.10*  ?CREATININE 2.21*  --  2.46* 2.65* 2.70*  --   ?TROPONINIHS 41* 65*  --   --   --   --   ? ? ?Estimated Creatinine Clearance: 28.8 mL/min (A) (by C-G formula based on SCr of 2.7 mg/dL (H)). ? ? ?Medical History: ?Past Medical History:  ?Diagnosis Date  ? Acute deep vein thrombosis (DVT) of tibial vein of right lower extremity ([place]) 04/13/2014  ? Acute pulmonary embolism ([place]) 04/15/2014  ? Arthritis   ? Chronic fatigue   ? Chronic kidney disease, stage IV (severe) ([place])   ? Complication of anesthesia   ? one surgery took 4 people to hold him down 2000  ? Constipation   ? Diabetic peripheral neuropathy ([place]) 10/26/2015  ? DM (diabetes mellitus), type 2, uncontrolled, with renal complications   ? [facility]  ? DVT (deep venous thrombosis) ([place])   ? RLE DVT with intermediate risk VQ scan for PE 03/2014  ? Dyspnea   ? Essential hypertension   ? Fibromyalgia   ? Hypothyroidism   ? Hypoxia 04/13/2014  ? Neuropathy   ? Obesity   ? OSA on CPAP   ?  Pulmonary embolism ([place]) 04/13/2014  ? VQ 04/13/14 intermediate assoc with R DVT > on coumadin since Echo 04/14/14 > PA peak pressure: 89 mm Hg (S).   ? ? ?Medications:  ?Prior to IV heparin, pt was on Lovenox 110 mg subq every 24 hours.  Last Lovenox dose was 110 mg on 3/9 at 0511 ?IV Heparin infusion initiated on 3/10 at 0125 at 1650 units/hr ? ?Assessment: ?19 yoM presented on 3/7 with SOB, HCAP, concern for aspiration.  PMH of anticoagulation for history of DVT/PE, but no longer taking any oral anticoagulation (Warfarin x6 months in 2015).  He was started on Lovenox '40mg'$  daily for VTE prophylaxis on admission.  Ddimer 4.7, Dopplers negative for DVT, limited exam.  Pharmacy was initially consulted to dose Lovenox for possible PE on 3/8, and then consulted to transition to Heparin  ?on 3/9.  ? ?Today 11/24/21, ?Heparin level subtherapeutic at <0.1 with heparin infusing at 1650 units/hr ?  No line interruption/issues nor bleeding per nursing ? ? ?Goal of Therapy:  ?Heparin level 0.3-0.7 units/ml ?Monitor platelets by anticoagulation protocol: Yes ?  ?Plan:  ?Give 3000 units IV heparin bolus x 1 ?Increase heparin infusion to 1950 units/hour ?Check heparin level in 8 hours  ?Monitor daily heparin level, CBC, signs/symptoms of bleeding ? ? ?Royetta Asal, PharmD, BCPS ?Clinical Pharmacist ?[place] ?Please utilize Amion for appropriate phone number to reach the unit pharmacist ([place]) ?11/24/2021 10:10 AM ? ?Addendum: ?At 12:12, the nurse approached me and stated when the heparin bolus was administered, the IV appeared to be leaking around IV site.  She then replaced the IV site.  I restarted the IV heparin at previous rate of 1650 units/hr since this morning's subtherapeutic heparin level might be caused by incomplete absorption/flow rate from leaking IV line at site. ? ?Plan: ?Change heparin infusion to 1650 units/hr ?Check heparin level in 8 hours ?Monitor daily heparin level, CBC, signs/symptoms of  bleeding ? ?Royetta Asal, PharmD, BCPS ?Clinical Pharmacist ?[place] ?Please utilize Amion for appropriate phone number to reach the unit pharmacist ([place]) ?11/24/2021 12:22 PM ? ? ? ? ?

## 2021-11-24 NOTE — Progress Notes (Addendum)
?   ? ? ? ? ?[facility] ? ?Date of Admission:  12/06/2021   Total days of inpatient antibiotics 3 ? ?Principal Problem: ?  Acute respiratory failure with hypoxia ([place]) ?Active Problems: ?  Diabetes mellitus type 2 with complications ([place]) ?  Essential hypertension ?  OSA on CPAP ?  Chronic kidney disease, stage IV (severe) ([place]) ?  Alzheimer's disease ([place]) ?  Hypothyroidism ?  Class 1 obesity ?     ?    ?Assessment: ?89 YM admitted for pneumonia, ID engaged as pt had worsening findings on  imaging.  ?  ?#Right and left lung pneumonia ?#Right lung aspiration pneumonia ?#CPAP qhs at home ?-Initial CXR showed left hilum and lower chest densities concerning for PNA. Repeat CXR on 3/9 showed right upper lung PNA.  ?-Speech consulted and diet changed to dysphagia 1 diet, puree solids and nectar thick liquid. O2 demand is relatively unchanged. Obtained advanced imaging for further characterization, suspect right lung findings are consistent with aspiration event.  ?-CT chest w/o con  showed large infiltrate in RLL and patchy infiltrates in RUL, RML Mri not tolereated due tokyphosis.  ?-CT head showed remote left cerebellar infarct. ?-TTE showed no vegetation, mild aortic valve thickening.  ?-Critical Care following and note that PE is possible.Noted to have b/l upper extremity superficial venous thrombus on 3/9. Pt continues to require 12L [place], not on O2 at home  ?Recommendations:  ?-Sputum Cx ordered ?-Continue meropenem to complete 7 days of antibiotics for hospital acquired pneumonia EOT 3/13. Given remote left cerebellar infarct(not mentioned on CT on 3/1) and dysphagia noted by speech suspect CVA_>dysphagia->aspiration pneumonia. In addition possible PE (pt on heparin gtt) would add to respiratory distress ?-Follow blood Cx ? ?Microbiology:   ?Antibiotics: ?Meropenem 3/7-p ?Vancomycin 3/7 ?  ?Cultures: ?Blood ?3/7 NG ? ?SUBJECTIVE: ?No new complaints. Resting in bed. ? ?Interval: tmax 100.5,  wbc 22.5k ? ?Review of Systems: ?Review of Systems  ?All other systems reviewed and are negative. ? ? ?Scheduled Meds: ? albuterol  2.5 mg Nebulization TID  ? amLODipine  10 mg Oral Daily  ? vitamin C  500 mg Oral Daily  ? Chlorhexidine Gluconate Cloth  6 each Topical Daily  ? famotidine  10 mg Oral QHS  ? feeding supplement (NEPRO CARB STEADY)  237 mL Oral BID BM  ? [START ON 12/05/21] insulin aspart  0-20 Units Subcutaneous TID WC  ? insulin aspart  0-5 Units Subcutaneous QHS  ? insulin aspart  3 Units Subcutaneous TID WC  ? levothyroxine  200 mcg Oral Once per day on Mon Tue Wed Thu Fri Sat  ? [START ON 11/26/2021] levothyroxine  300 mcg Oral Once per day on Sun  ? mouth rinse  15 mL Mouth Rinse BID  ? multivitamin  1 tablet Oral QHS  ? ?Continuous Infusions: ? sodium chloride 10 mL/hr at 11/22/21 1010  ? sodium chloride 10 mL/hr at 11/24/21 1053  ? heparin 1,650 Units/hr (11/24/21 1800)  ? meropenem (MERREM) IV Stopped (11/24/21 1144)  ? ?PRN Meds:.Place/Maintain arterial line **AND** sodium chloride, sodium chloride, acetaminophen **OR** acetaminophen, albuterol, clonazePAM, hydrALAZINE, ipratropium, ondansetron **OR** ondansetron (ZOFRAN) IV, polyvinyl alcohol ?Allergies  ?Allergen Reactions  ? Statins Other (See Comments)  ?  Elevated CK level  ? Lisinopril   ?  Other reaction(s): stopped by pulmonology  to help with breathing  ? Latex Rash  ?  Severe blistery rash  ? ? ?OBJECTIVE: ?Vitals:  ? 11/24/21 1516 11/24/21  1600 11/24/21 1800 11/24/21 2006  ?BP:  (!) 159/42 (!) 159/46 (!) 160/117  ?Pulse:  82 88 85  ?Resp:  (!) 24 (!) 23 (!) 27  ?Temp: 98.2 ?F (36.8 ?C)     ?TempSrc: Oral     ?SpO2:  94% 99% 100%  ?Weight:      ?Height:      ? ?Body mass index is 31.88 kg/m?. ? ?Physical Exam ?Constitutional:   ?   General: He is not in acute distress. ?   Appearance: He is normal weight. He is not toxic-appearing.  ?HENT:  ?   Head: Normocephalic and atraumatic.  ?   Right Ear: External ear normal.  ?   Left Ear:  External ear normal.  ?   Nose: No congestion or rhinorrhea.  ?   Mouth/Throat:  ?   Mouth: Mucous membranes are moist.  ?   Pharynx: Oropharynx is clear.  ?Eyes:  ?   Extraocular Movements: Extraocular movements intact.  ?   Conjunctiva/sclera: Conjunctivae normal.  ?   Pupils: Pupils are equal, round, and reactive to light.  ?Cardiovascular:  ?   Rate and Rhythm: Normal rate and regular rhythm.  ?   Heart sounds: No murmur heard. ?  No friction rub. No gallop.  ?Pulmonary:  ?   Effort: Pulmonary effort is normal.  ?   Breath sounds: Normal breath sounds.  ?Abdominal:  ?   General: Abdomen is flat. Bowel sounds are normal.  ?   Palpations: Abdomen is soft.  ?Musculoskeletal:     ?   General: No swelling. Normal range of motion.  ?   Cervical back: Normal range of motion and neck supple.  ?Skin: ?   General: Skin is warm and dry.  ?Neurological:  ?   General: No focal deficit present.  ?   Mental Status: He is oriented to person, place, and time.  ?Psychiatric:     ?   Mood and Affect: Mood normal.  ? ? ? ? ?Lab Results ?Lab Results  ?Component Value Date  ? WBC 22.5 (H) 11/24/2021  ? HGB 10.6 (L) 11/24/2021  ? HCT 33.8 (L) 11/24/2021  ? MCV 92.9 11/24/2021  ? PLT 378 11/24/2021  ?  ?Lab Results  ?Component Value Date  ? CREATININE 2.70 (H) 11/24/2021  ? BUN 101 (H) 11/24/2021  ? NA 143 11/24/2021  ? K 3.9 11/24/2021  ? CL 107 11/24/2021  ? CO2 24 11/24/2021  ?  ?Lab Results  ?Component Value Date  ? ALT 11 11/24/2021  ? AST 14 (L) 11/24/2021  ? ALKPHOS 55 11/24/2021  ? BILITOT 0.5 11/24/2021  ?  ? ? ? ? ?Laurice Record, MD ?[facility] ?[facility] ?11/24/2021, 8:32 PM  ?

## 2021-11-24 NOTE — Progress Notes (Signed)
Order for CT noted. At this moment, pt on Bipap unable to tolerate flat surface, will reassess in morning. In addition, wife at bedside would also like to hold off on this image if possible, she believes that this will cause him more distress and wants to speak to MD about this in the morning if possible.  ?

## 2021-11-24 NOTE — Consult Note (Signed)
Consultation Note Date: 11/24/2021   Patient Name: Daniel Ochoa  DOB: 1941/01/26  MRN: [id number]  Age / Sex: 81 y.o., male  PCP: Donnajean Lopes, MD Referring Physician: Kayleen Memos, DO  Reason for Consultation: Establishing goals of care  HPI/Patient Profile: 81 y.o. male  with past medical history of A-fib, remote DVT/PE, hypothyroidism, OSA on CPAP, hypertension, diabetes, CKD, AV fistula (not on HD), TIA, dementia admitted on 12/02/2021 with respiratory failure secondary to multifocal pneumonia, HCAP, concern for aspiration, concern for PE.  Palliative consulted for goals of care.  Clinical Assessment and Goals of Care: Palliative care consult received.  Chart reviewed including personal review of pertinent labs and imaging.  I met today with Daniel Ochoa and his wife.  He was awake and alert and off of BiPAP at time of my encounter.  His wife was getting ready to try and go home to get some rest and appeared to be tired.  We discussed clinical course as well as wishes moving forward in regard to advanced directives.  Concepts specific to code status and care plan this hospitalization discussed.  We discussed difference between a aggressive medical intervention path and a palliative, comfort focused care path.  Values and goals of care important to patient and family were attempted to be elicited.   Discussed that, at this time, they are hopeful for improvement with continued interventions but understand that there is real possibility he may not improve.  We discussed plan to continue with current interventions and continue to follow-up to continue discussion based upon his clinical course of the next couple of days.  SUMMARY OF RECOMMENDATIONS   -DNR -Continue current care.  Daniel Ochoa and his wife are both hopeful that he may improve with continuation of current interventions.  Discussed today unknown  of how he is going to do long-term and concerns due to fact he has significant HCAP/aspiration pneumonia with continued need for supplemental oxygen/BiPAP.   -We will continue to follow over the next several days and progress conversation based upon his clinical course.  We discussed plan to meet him tomorrow at 1130.  Code Status/Advance Care Planning: DNR  Prognosis:  Guarded  Discharge Planning: To Be Determined      Primary Diagnoses: Present on Admission:  Acute respiratory failure with hypoxia ([place])  Diabetes mellitus type 2 with complications ([place])  Essential hypertension  Chronic kidney disease, stage IV (severe) (HCC)  Alzheimer's disease ([place])  Class 1 obesity  Hypothyroidism   I have reviewed the medical record, interviewed the patient and family, and examined the patient. The following just want to see.  Are pertinent.  Past Medical History:  Diagnosis Date   Acute deep vein thrombosis (DVT) of tibial vein of right lower extremity ([place]) 04/13/2014   Acute pulmonary embolism (HCC) 04/15/2014   Arthritis    Chronic fatigue    Chronic kidney disease, stage IV (severe) (HCC)    Complication of anesthesia    one surgery took 4 people to hold him down  2000   Constipation    Diabetic peripheral neuropathy ([place]) 10/26/2015   DM (diabetes mellitus), type 2, uncontrolled, with renal complications    [facility]   DVT (deep venous thrombosis) ([place])    RLE DVT with intermediate risk VQ scan for PE 03/2014   Dyspnea    Essential hypertension    Fibromyalgia    Hypothyroidism    Hypoxia 04/13/2014   Neuropathy    Obesity    OSA on CPAP    Pulmonary embolism ([place]) 04/13/2014   VQ 04/13/14 intermediate assoc with R DVT > on coumadin since Echo 04/14/14 > PA peak pressure: 89 mm Hg (S).    Social History   Socioeconomic History   Marital status: Married    Spouse name: Not on file   Number of children: 1   Years of education: Not on file   Highest education level: Not  on file  Occupational History   Not on file  Tobacco Use   Smoking status: Former    Types: Pipe   Smokeless tobacco: Never   Tobacco comments:    quit in 1993  Vaping Use   Vaping Use: Never used  Substance and Sexual Activity   Alcohol use: No   Drug use: No   Sexual activity: Not on file  Other Topics Concern   Not on file  Social History Narrative   Not on file   Social Determinants of Health   Financial Resource Strain: Not on file  Food Insecurity: Not on file  Transportation Needs: Not on file  Physical Activity: Not on file  Stress: Not on file  Social Connections: Not on file   Family History  Problem Relation Age of Onset   Cancer Father    Scheduled Meds:  albuterol  2.5 mg Nebulization TID   amLODipine  10 mg Oral Daily   vitamin C  500 mg Oral Daily   Chlorhexidine Gluconate Cloth  6 each Topical Daily   famotidine  10 mg Oral QHS   feeding supplement (NEPRO CARB STEADY)  237 mL Oral BID BM   insulin aspart  0-15 Units Subcutaneous TID WC   insulin aspart  0-5 Units Subcutaneous QHS   insulin aspart  3 Units Subcutaneous TID WC   levothyroxine  200 mcg Oral Once per day on Mon Tue Wed Thu Fri Sat   [START ON 11/26/2021] levothyroxine  300 mcg Oral Once per day on Sun   mouth rinse  15 mL Mouth Rinse BID   multivitamin  1 tablet Oral QHS   Continuous Infusions:  sodium chloride 10 mL/hr at 11/22/21 1010   heparin 1,650 Units/hr (11/24/21 0600)   meropenem (MERREM) IV Stopped (11/24/21 0000)   PRN Meds:.Place/Maintain arterial line **AND** sodium chloride, acetaminophen **OR** acetaminophen, albuterol, clonazePAM, hydrALAZINE, ipratropium, ondansetron **OR** ondansetron (ZOFRAN) IV, polyvinyl alcohol Medications Prior to Admission:  Prior to Admission medications   Medication Sig Start Date End Date Taking? Authorizing Provider  amLODipine (NORVASC) 5 MG tablet Take 5 mg by mouth daily.  03/18/15  Yes [provider]  atenolol (TENORMIN) 50  MG tablet Take 50 mg by mouth 2 (two) times daily.  01/31/14  Yes [provider]  Calcium-Vitamin D-Vitamin K ([employer] CALCIUM SOFT CHEWS) 650-12.5-40 MG-MCG-MCG CHEW Chew 1-2 tablets by mouth 2 (two) times daily. Take 1 tablet in the morning & take 2 tablets by mouth at night   Yes [provider]  carboxymethylcellulose (REFRESH PLUS) 0.5 % SOLN Place 2 drops  into both eyes 3 (three) times daily as needed (allergy/irritated eyes.).   Yes [provider]  clonazePAM (KLONOPIN) 1 MG tablet Take 1 tablet (1 mg total) by mouth at bedtime. 11/17/21  Yes Patrecia Pour, MD  famotidine (PEPCID) 40 MG tablet Take 40 mg by mouth at bedtime.  03/17/14  Yes [provider]  furosemide (LASIX) 40 MG tablet Take 40-80 mg by mouth See admin instructions. Take 2 tablets (80 mg) by mouth in the morning & take 1 tablet (40 mg) by mouth in the afternoon. 10/20/14  Yes [provider]  insulin aspart (NOVOLOG) 100 UNIT/ML injection Inject 0-9 Units into the skin 3 (three) times daily with meals. 11/17/21  Yes Patrecia Pour, MD  ipratropium (ATROVENT) 0.03 % nasal spray Place 2 sprays into both nostrils daily as needed for rhinitis.   Yes [provider]  SYNTHROID 200 MCG tablet Take 200-300 mcg by mouth See admin instructions. Take 1 tablet (200 mcg) by mouth daily except on Sundays take 1.5 tablets (300 mcg) tablet by mouth 01/12/14  Yes [provider]  vitamin C (ASCORBIC ACID) 250 MG tablet Take 250-500 mg by mouth See admin instructions. Take 1 tablet (250 mg) by mouth in the morning and take 2 tablets (500 mg) by mouth at night   Yes [provider]  Accu-Chek Softclix Lancets lancets USE TO CHECK BLOOD SUGAR UP TO TID E11.29 12/21/19   [provider]  FREESTYLE TEST STRIPS test strip USE AS DIRECTED TO TEST BLOOD SUGAR 5 TIMES PER DAY. DX: E11.65 06/06/18   [provider]  Lancets Misc. (ACCU-CHEK FASTCLIX LANCET) KIT USE TO CHECK BLOOD  SUGAR UP TO TID E11.29 12/23/19   [provider]   Allergies  Allergen Reactions   Statins Other (See Comments)    Elevated CK level   Lisinopril     Other reaction(s): stopped by pulmonology  to help with breathing   Latex Rash    Severe blistery rash   Physical Exam General: Alert, awake, in no acute distress.  chronically ill appearing Heart: Regular rate and rhythm. No murmur appreciated. Lungs: Decreased movement Abdomen: Soft, nontender, nondistended, positive bowel sounds.   Ext: No significant edema Skin: Warm and dry Neuro: Grossly intact, nonfocal.   Vital Signs: BP (!) 145/41    Pulse 78    Temp 97.7 F (36.5 C) (Oral)    Resp 18    Ht _0  (1.854 m)    Wt 109.6 kg    SpO2 97%    BMI 31.88 kg/m  Pain Scale: 0-10   Pain Score: Asleep   SpO2: SpO2: 97 % O2 Device:SpO2: 97 % O2 Flow Rate: .O2 Flow Rate (L/min): 11 L/min  IO: Intake/output summary:  Intake/Output Summary (Last 24 hours) at 11/24/2021 0955 Last data filed at 11/24/2021 0600 Gross per 24 hour  Intake 171.42 ml  Output 700 ml  Net -528.58 ml    LBM: Last BM Date :  (PTA) Baseline Weight: Weight: 111.6 kg Most recent weight: Weight: 109.6 kg     Palliative Assessment/Data:   Flowsheet Rows    Flowsheet Row Most Recent Value  Intake Tab   Referral Department Hospitalist  Unit at Time of Referral Intermediate Care Unit  Palliative Care Primary Diagnosis Sepsis/Infectious Disease  Date Notified 11/22/21  Palliative Care Type New Palliative care  Reason for referral Clarify Goals of Care  Date of Admission 11/20/2021  Date first seen by Palliative Care 11/23/21  #  of days Palliative referral response time 1 Day(s)  # of days IP prior to Palliative referral 1  Clinical Assessment   Palliative Performance Scale Score 30%  Psychosocial & Spiritual Assessment   Palliative Care Outcomes   Patient/Family meeting held? Yes  Who was at the meeting? Patient, wife       Time In:  1700  Time Out: 1800 Time Total: 60 Greater than 50%  of this time was spent counseling and coordinating care related to the above assessment and plan.  Signed by: Micheline Rough, MD   Please contact Palliative Medicine Team phone at [phone] for questions and concerns.  For individual provider: See Shea Evans

## 2021-11-24 NOTE — Evaluation (Signed)
Occupational Therapy Evaluation Patient Details Name: Daniel Ochoa MRN: [id number] DOB: April 03, 1941 Today's Date: 11/24/2021   History of Present Illness Daniel Ochoa is a 81 y.o. male that was recently discharged from the hospital due to TIA, E. coli UTI RUE edema and is returning to the emergency department 12/07/2021 from [facility] due to shortness of breath with hypoxia associated with weakness, productive cough.CXR on 3/9 showed right upper lung PNA. PMH: DVT, pulmonary embolism, osteoarthritis, chronic fatigue, stage IV chronic kidney disease, type 2 diabetes, diabetic peripheral neuropathy, constipation, hypertension, fibromyalgia, hypothyroid, obesity, OSA on CPAP, dementia, unspecified skin cancer   Clinical Impression   Patient from SNF rehab after TIA and UTI, per chart review patient has not stood since 2/21. Patient states he has not been getting in wheelchair, has mostly been in bed. Prior to previous hospitalization spouse reported patient could perform some basic ADL tasks. Patient presents with profound global weakness, significantly limited bilateral shoulder range of motion with 3/5 elbow strength in R UE and 2+/5 elbow strength in L UE, bilateral grip strength of 3/5. Patient needing max A to try and wash his face and total A to utilize oral swab to moisten mouth. When asked patient his goal for therapy states he wants to be able to walk. Did discuss with him due to significant weakness it would be a very long road of recovery but that acute OT with trial therapy to work on his goals.      Recommendations for follow up therapy are one component of a multi-disciplinary discharge planning process, led by the attending physician.  Recommendations may be updated based on patient status, additional functional criteria and insurance authorization.   Follow Up Recommendations  Skilled nursing-short term rehab (<3 hours/day)    Assistance Recommended at Discharge  Frequent or constant Supervision/Assistance  Patient can return home with the following Two people to help with walking and/or transfers;Two people to help with bathing/dressing/bathroom;Assistance with cooking/housework;Assistance with feeding;Direct supervision/assist for medications management;Direct supervision/assist for financial management;Assist for transportation;Help with stairs or ramp for entrance    Functional Status Assessment  Patient has had a recent decline in their functional status and/or demonstrates limited ability to make significant improvements in function in a reasonable and predictable amount of time  Equipment Recommendations  Other (comment) (defer next venue of care)       Precautions / Restrictions Precautions Precautions: Fall Precaution Comments: on 11L HFNC Restrictions Weight Bearing Restrictions: No      Mobility Bed Mobility Overal bed mobility: Needs Assistance             General bed mobility comments: Attempted to have patient assist with correcting posture in bed. Patient is unable to provide any assist to bring trunk to midline with HEAVY lean to R side    Transfers                   General transfer comment: Will need mechanical lift          ADL either performed or assessed with clinical judgement   ADL Overall ADL's : Needs assistance/impaired Eating/Feeding: Total assistance;Bed level Eating/Feeding Details (indicate cue type and reason): Used oral swab to moisten mouth Grooming: Wash/dry face;Maximal assistance;Bed level Grooming Details (indicate cue type and reason): Patient utilize both upper extremities to bring wash cloth to mouth and able to wipe off mouth only needing max A to complete task. Upper Body Bathing: Total assistance;Bed level   Lower Body Bathing: Total  assistance;Bed level   Upper Body Dressing : Total assistance;Bed level   Lower Body Dressing: Total assistance;Bed level     Toilet Transfer  Details (indicate cue type and reason): Unable would need mechanical lift Toileting- Clothing Manipulation and Hygiene: Total assistance;Bed level       Functional mobility during ADLs: Total assistance General ADL Comments: Patient with profound global weakness needing near total assistance for self care tasks.     Vision Baseline Vision/History: 1 Wears glasses              Pertinent Vitals/Pain Pain Assessment Pain Assessment: Faces Faces Pain Scale: Hurts a little bit Pain Location: generalized with bed repositioning Pain Descriptors / Indicators: Grimacing Pain Intervention(s): Monitored during session     Hand Dominance Right   Extremity/Trunk Assessment Upper Extremity Assessment Upper Extremity Assessment: RUE deficits/detail;LUE deficits/detail RUE Deficits / Details: Reports shoulder "problems" R>L. AAROM to approximately 60 degrees of flexion. Elbow flexion limited to ~ 100 degrees, elbow strength 3-/5, grip 3/5 RUE Coordination: decreased fine motor;decreased gross motor LUE Deficits / Details: Shoulder flexion limited ~20 degrees, elbow strength 2+/5, grip 3/5 LUE Coordination: decreased fine motor;decreased gross motor   Lower Extremity Assessment Lower Extremity Assessment: Defer to PT evaluation       Communication Communication Communication: Other (comment) (low phonation at times)   Cognition Arousal/Alertness: Awake/alert Behavior During Therapy: Flat affect Overall Cognitive Status: History of cognitive impairments - at baseline                                 General Comments: Per chart review patient with hx of dementia. Knows he is in the hospital and correct month only. Could follow 1 step cues with increased time.                Home Living Family/patient expects to be discharged to:: Skilled nursing facility                                        Prior Functioning/Environment Prior Level of Function :  Needs assist             Mobility Comments: limited to sitting at SNF per wife, has not stood since 11/07/21 ADLs Comments: Patient reports not getting out of bed at SNF and likely total care for ADLs. Per prior chart review patient could do some basical ADLs in [place] prior to TIA        OT Problem List: Decreased strength;Decreased range of motion;Decreased activity tolerance;Impaired balance (sitting and/or standing);Decreased coordination;Decreased safety awareness;Decreased knowledge of use of DME or AE;Obesity;Impaired UE functional use;Pain;Increased edema;Cardiopulmonary status limiting activity      OT Treatment/Interventions: Self-care/ADL training;DME and/or AE instruction;Patient/family education;Balance training;Therapeutic exercise;Therapeutic activities;Neuromuscular education    OT Goals(Current goals can be found in the care plan section) Acute Rehab OT Goals Patient Stated Goal: "be able to walk" OT Goal Formulation: With patient Time For Goal Achievement: 12/08/21 Potential to Achieve Goals: Fair  OT Frequency: Min 2X/week       AM-PAC OT "6 Clicks" Daily Activity     Outcome Measure Help from another person eating meals?: Total Help from another person taking care of personal grooming?: A Lot Help from another person toileting, which includes using toliet, bedpan, or urinal?: Total Help from another person bathing (including washing, rinsing, drying)?: Total  Help from another person to put on and taking off regular upper body clothing?: Total Help from another person to put on and taking off regular lower body clothing?: Total 6 Click Score: 7   End of Session Equipment Utilized During Treatment: Oxygen Nurse Communication: Other (comment) (IV complete)  Activity Tolerance: Patient limited by fatigue Patient left: in bed;with call bell/phone within reach;with bed alarm set  OT Visit Diagnosis: Other abnormalities of gait and mobility (R26.89);Muscle  weakness (generalized) (M62.81)                Time: 6394-3200 OT Time Calculation (min): 15 min Charges:  OT General Charges $OT Visit: 1 Visit OT Evaluation $OT Eval Low Complexity: [place] OT OT pager: [phone]  Rosemary Holms 11/24/2021, 2:38 PM

## 2021-11-24 NOTE — Progress Notes (Signed)
Progress Note  Patient Name: Daniel Ochoa Date of Encounter: 11/24/2021  [facility] Cardiologist: Larae Grooms, MD   Subjective   No acute events overnight. Denies pain or shortness of breath.  Inpatient Medications    Scheduled Meds:  albuterol  2.5 mg Nebulization TID   amLODipine  10 mg Oral Daily   vitamin C  500 mg Oral Daily   Chlorhexidine Gluconate Cloth  6 each Topical Daily   famotidine  10 mg Oral QHS   feeding supplement (NEPRO CARB STEADY)  237 mL Oral BID BM   insulin aspart  0-15 Units Subcutaneous TID WC   insulin aspart  0-5 Units Subcutaneous QHS   insulin aspart  3 Units Subcutaneous TID WC   levothyroxine  200 mcg Oral Once per day on Mon Tue Wed Thu Fri Sat   [START ON 11/26/2021] levothyroxine  300 mcg Oral Once per day on Sun   mouth rinse  15 mL Mouth Rinse BID   multivitamin  1 tablet Oral QHS   Continuous Infusions:  sodium chloride 10 mL/hr at 11/22/21 1010   sodium chloride 10 mL/hr at 11/24/21 1053   heparin 1,650 Units/hr (11/24/21 1339)   meropenem (MERREM) IV Stopped (11/24/21 1144)   PRN Meds: Place/Maintain arterial line **AND** sodium chloride, sodium chloride, acetaminophen **OR** acetaminophen, albuterol, clonazePAM, hydrALAZINE, ipratropium, ondansetron **OR** ondansetron (ZOFRAN) IV, polyvinyl alcohol   Vital Signs    Vitals:   11/24/21 0900 11/24/21 1000 11/24/21 1119 11/24/21 1200  BP: (!) 148/35 (!) 158/43 (!) 136/45 (!) 141/43  Pulse: 73 68  72  Resp: (!) 24 (!) 23  17  Temp:    97.9 F (36.6 C)  TempSrc:    Oral  SpO2: 96% 100%  94%  Weight:      Height:        Intake/Output Summary (Last 24 hours) at 11/24/2021 1341 Last data filed at 11/24/2021 1219 Gross per 24 hour  Intake 260.4 ml  Output 700 ml  Net -439.6 ml   Last 3 Weights 11/17/2021 12/11/2021 11/10/2021  Weight (lbs) 241 lb 10 oz 246 lb 253 lb 1.4 oz  Weight (kg) 109.6 kg 111.585 kg 114.8 kg      Telemetry    NSR - Personally  Reviewed  ECG    No new since 11/17/2021 - Personally Reviewed  Physical Exam   GEN: No acute distress.   Neck: No JVD Cardiac: RRR, no murmurs, rubs, or gallops.  Respiratory: Normal work of breathing, no rales/rhonchi appreciated GI: Soft, nontender, non-distended  MS: Trivial bilateral LE edema; No deformity. Neuro:  Nonfocal  Psych: Normal affect   Labs    High Sensitivity Troponin:   Recent Labs  Lab 11/10/21 1409 11/10/21 1725 11/17/2021 1050 11/27/2021 1742  TROPONINIHS 31* 18* 41* 65*     Chemistry Recent Labs  Lab 11/22/21 0315 11/23/21 0306 11/24/21 0303  NA 142 143 143  K 4.2 3.6 3.9  CL 104 107 107  CO2 '26 26 24  '$ GLUCOSE 291* 214* 260*  BUN 78* 91* 101*  CREATININE 2.46* 2.65* 2.70*  CALCIUM 9.0 8.8* 8.6*  MG  --  2.4 2.4  PROT 6.8 6.4* 6.0*  ALBUMIN 3.2* 2.8* 2.7*  AST 14* 13* 14*  ALT '13 11 11  '$ ALKPHOS 51 53 55  BILITOT 0.5 0.3 0.5  GFRNONAA 26* 24* 23*  ANIONGAP '12 10 12    '$ Lipids No results for input(s): CHOL, TRIG, HDL, LABVLDL, LDLCALC, CHOLHDL in the last  168 hours.  Hematology Recent Labs  Lab 11/22/21 0315 11/23/21 0306 11/24/21 0303  WBC 29.8* 20.7* 22.5*  RBC 4.09* 3.56* 3.64*  HGB 12.0* 10.4* 10.6*  HCT 38.4* 33.0* 33.8*  MCV 93.9 92.7 92.9  MCH 29.3 29.2 29.1  MCHC 31.3 31.5 31.4  RDW 13.8 14.1 14.2  PLT 397 404* 378   Thyroid  Recent Labs  Lab 12/04/2021 1050  TSH 1.043    BNP Recent Labs  Lab 12/13/2021 1051 11/24/21 0303  BNP 195.4* 225.6*    DDimer  Recent Labs  Lab 11/22/21 0803 11/23/21 0306 11/24/21 0303  DDIMER 4.72* 6.18* 4.54*     Radiology    DG CHEST PORT 1 VIEW  Result Date: 11/23/2021 CLINICAL DATA:  81 year old male with history of hypoxia. EXAM: PORTABLE CHEST 1 VIEW COMPARISON:  Chest x-ray 11/20/2021. FINDINGS: Lung volumes are low. Some improvement in aeration in the left mid to lower lung is noted. However, widespread areas of interstitial prominence and patchy nodular appearing airspace  consolidation is noted throughout the right lung on today's study, most evident in the right upper lobe. No definite pleural effusions. No pneumothorax. No evidence of pulmonary edema. Heart size is within normal limits. The patient is rotated to the right on today's exam, resulting in distortion of the mediastinal contours and reduced diagnostic sensitivity and specificity for mediastinal pathology. Surgical clips project over the right side of the thoracic inlet, likely from prior thyroid surgery. IMPRESSION: 1. Worsening multilobar pneumonia in the right lung, most evident in the right upper lobe. 2. Improving pneumonia in the left lung. Electronically Signed   By: Vinnie Langton M.D.   On: 11/23/2021 06:19   VAS [place] UPPER EXTREMITY VENOUS DUPLEX  Result Date: 11/23/2021 UPPER VENOUS STUDY  Patient Name:  Daniel Ochoa  Date of Exam:   11/23/2021 Medical Rec #: [id number]            Accession #:    [id number] Date of Birth: April 17, 1941           Patient Gender: M Patient Age:   81 years Exam Location:  [facility] Procedure:      VAS [place] UPPER EXTREMITY VENOUS DUPLEX Referring Phys: Irene Pap --------------------------------------------------------------------------------  Indications: Edema Risk Factors: None identified. Limitations: Poor ultrasound/tissue interface and patient positioning. Comparison Study: No prior studies. Performing Technologist: Oliver Hum RVT  Examination Guidelines: A complete evaluation includes B-mode imaging, spectral Doppler, color Doppler, and power Doppler as needed of all accessible portions of each vessel. Bilateral testing is considered an integral part of a complete examination. Limited examinations for reoccurring indications may be performed as noted.  Right Findings: +----------+------------+---------+-----------+----------+-----------------+  RIGHT      Compressible Phasicity Spontaneous Properties      Summary        +----------+------------+---------+-----------+----------+-----------------+  IJV            Full        Yes        Yes                                   +----------+------------+---------+-----------+----------+-----------------+  Subclavian     Full        Yes        Yes                                   +----------+------------+---------+-----------+----------+-----------------+  Axillary       Full        Yes        Yes                                   +----------+------------+---------+-----------+----------+-----------------+  Brachial       Full        Yes        Yes                                   +----------+------------+---------+-----------+----------+-----------------+  Radial         Full                                                         +----------+------------+---------+-----------+----------+-----------------+  Ulnar          Full                                                         +----------+------------+---------+-----------+----------+-----------------+  Cephalic       Full                                                         +----------+------------+---------+-----------+----------+-----------------+  Basilic        None                                      Age Indeterminate  +----------+------------+---------+-----------+----------+-----------------+ Superficial thrombus detected in the basilic vein is noted to begin in the mid upper arm and extend to the distal forearm.  Left Findings: +----------+------------+---------+-----------+----------+-------+  LEFT       Compressible Phasicity Spontaneous Properties Summary  +----------+------------+---------+-----------+----------+-------+  IJV            Full        Yes        Yes                         +----------+------------+---------+-----------+----------+-------+  Subclavian     Full        Yes        Yes                         +----------+------------+---------+-----------+----------+-------+  Axillary       Full        Yes         Yes                         +----------+------------+---------+-----------+----------+-------+  Brachial       Full        Yes        Yes                         +----------+------------+---------+-----------+----------+-------+  Radial         Full                                               +----------+------------+---------+-----------+----------+-------+  Ulnar          Full                                               +----------+------------+---------+-----------+----------+-------+  Cephalic       None                                       Acute   +----------+------------+---------+-----------+----------+-------+  Basilic        Full                                               +----------+------------+---------+-----------+----------+-------+ Superficial thrombus detected in the cephalic vein is noted to be only in the mid forearm.  Summary:  Right: No evidence of deep vein thrombosis in the upper extremity. Findings consistent with age indeterminate superficial vein thrombosis involving the right basilic vein.  Left: No evidence of deep vein thrombosis in the upper extremity. Findings consistent with acute superficial vein thrombosis involving the left cephalic vein.  *See table(s) above for measurements and observations.  Diagnosing physician: Jamelle Haring Electronically signed by Jamelle Haring on 11/23/2021 at 2:36:58 PM.    Final     Cardiac Studies   Echo 11/22/21 1. Left ventricular ejection fraction, by estimation, is 60 to 65%. The  left ventricle has normal function. The left ventricle has no regional  wall motion abnormalities. There is mild concentric left ventricular  hypertrophy. Left ventricular diastolic  parameters are consistent with Grade II diastolic dysfunction  (pseudonormalization).   2. Right ventricular systolic function is normal. The right ventricular  size is moderately enlarged. There is normal pulmonary artery systolic  pressure.   3. Left atrial size was  moderately dilated.   4. Right atrial size was mildly dilated.   5. The mitral valve was not well visualized. Trivial mitral valve  regurgitation. No evidence of mitral stenosis. Moderate mitral annular  calcification.   6. The aortic valve is tricuspid. There is mild calcification of the  aortic valve. There is mild thickening of the aortic valve. Aortic valve  regurgitation is not visualized. Aortic valve sclerosis is present, with  no evidence of aortic valve stenosis.   Comparison(s): No significant change from prior study.  Patient Profile     81 y.o. male with PMH prior DVT/PE, ESRD not yet on dialysis, type II diabetes, OSA whom we are asked to see for enlarged RV at the request of Dr. Nevada Crane.  Assessment & Plan    Moderately enlarged RV, with normal function -I personally read echo, while size is moderately enlarged, there is no evidence of acute right heart failure. RV function is normal. -Already started on anticoagulation as below, given no evidence of right heart failure, limited incremental benefit in VQ unless there is question regarding anticoagulation/duration. Defer to  primary team on this. -no further testing needed from cardiac perspective  No DVT on ultrasound, superficial thrombus noted Question of prior afib (seen on sleep study, declined ILR, none seen on outpatient monitor) -has history of prior DVT/PE. Defer long term anticoagulation strategy to primary team for VTE  ESRD, with AV fistula but not yet on HD -avoid contrast/nephrotoxic meds  Concern for HCAP vs aspiration pneumonia OSA on CPAP Acute hypoxic respiratory failure -management per primary team and PCCM -patient is DNR  [facility] will sign off.   Medication Recommendations:  no changes Other recommendations (labs, testing, etc):  none Follow up as an outpatient:  we will arrange for outpatient follow up with Dr. Irish Lack or a member of his team  For questions or updates, please contact  [facility] Please consult [email or web address] for contact info under        Signed, Buford Dresser, MD  11/24/2021, 1:41 PM

## 2021-11-25 ENCOUNTER — Encounter (HOSPITAL_COMMUNITY): Payer: Medicare Other

## 2021-11-25 ENCOUNTER — Inpatient Hospital Stay (HOSPITAL_COMMUNITY): Payer: Medicare Other

## 2021-11-25 DIAGNOSIS — J189 Pneumonia, unspecified organism: Secondary | ICD-10-CM | POA: Diagnosis not present

## 2021-11-25 DIAGNOSIS — G309 Alzheimer's disease, unspecified: Secondary | ICD-10-CM | POA: Diagnosis not present

## 2021-11-25 DIAGNOSIS — J9601 Acute respiratory failure with hypoxia: Secondary | ICD-10-CM | POA: Diagnosis not present

## 2021-11-25 DIAGNOSIS — Z7189 Other specified counseling: Secondary | ICD-10-CM | POA: Diagnosis not present

## 2021-11-25 LAB — CBC WITH DIFFERENTIAL/PLATELET
Abs Immature Granulocytes: 0.46 10*3/uL — ABNORMAL HIGH (ref 0.00–0.07)
Basophils Absolute: 0.1 10*3/uL (ref 0.0–0.1)
Basophils Relative: 0 %
Eosinophils Absolute: 0.1 10*3/uL (ref 0.0–0.5)
Eosinophils Relative: 1 %
HCT: 36.6 % — ABNORMAL LOW (ref 39.0–52.0)
Hemoglobin: 11.4 g/dL — ABNORMAL LOW (ref 13.0–17.0)
Immature Granulocytes: 2 %
Lymphocytes Relative: 4 %
Lymphs Abs: 1.1 10*3/uL (ref 0.7–4.0)
MCH: 29 pg (ref 26.0–34.0)
MCHC: 31.1 g/dL (ref 30.0–36.0)
MCV: 93.1 fL (ref 80.0–100.0)
Monocytes Absolute: 2 10*3/uL — ABNORMAL HIGH (ref 0.1–1.0)
Monocytes Relative: 7 %
Neutro Abs: 24.2 10*3/uL — ABNORMAL HIGH (ref 1.7–7.7)
Neutrophils Relative %: 86 %
Platelets: 351 10*3/uL (ref 150–400)
RBC: 3.93 MIL/uL — ABNORMAL LOW (ref 4.22–5.81)
RDW: 14.2 % (ref 11.5–15.5)
WBC: 28.1 10*3/uL — ABNORMAL HIGH (ref 4.0–10.5)
nRBC: 0 % (ref 0.0–0.2)

## 2021-11-25 LAB — BASIC METABOLIC PANEL
Anion gap: 13 (ref 5–15)
BUN: 112 mg/dL — ABNORMAL HIGH (ref 8–23)
CO2: 25 mmol/L (ref 22–32)
Calcium: 9 mg/dL (ref 8.9–10.3)
Chloride: 105 mmol/L (ref 98–111)
Creatinine, Ser: 2.89 mg/dL — ABNORMAL HIGH (ref 0.61–1.24)
GFR, Estimated: 21 mL/min — ABNORMAL LOW (ref >60)
Glucose, Bld: 362 mg/dL — ABNORMAL HIGH (ref 70–99)
Potassium: 3.8 mmol/L (ref 3.5–5.1)
Sodium: 143 mmol/L (ref 135–145)

## 2021-11-25 LAB — GLUCOSE, CAPILLARY
Glucose-Capillary: 364 mg/dL — ABNORMAL HIGH (ref 70–99)
Glucose-Capillary: 337 mg/dL — ABNORMAL HIGH (ref 70–99)
Glucose-Capillary: 247 mg/dL — ABNORMAL HIGH (ref 70–99)
Glucose-Capillary: 203 mg/dL — ABNORMAL HIGH (ref 70–99)

## 2021-11-25 MED ORDER — POLYVINYL ALCOHOL 1.4 % OP SOLN
1.0000 [drp] | Freq: Four times a day (QID) | OPHTHALMIC | Status: DC | PRN
  Filled 2021-11-25: qty 15

## 2021-11-25 MED ORDER — HALOPERIDOL 1 MG PO TABS: 0.5000 mg | ORAL_TABLET | ORAL | Status: DC | PRN

## 2021-11-25 MED ORDER — APIXABAN 5 MG PO TABS
5.0000 mg | ORAL_TABLET | Freq: Two times a day (BID) | ORAL | Status: DC
  Administered 2021-11-25: 5 mg via ORAL
  Filled 2021-11-25: qty 1

## 2021-11-25 MED ORDER — DEXMEDETOMIDINE HCL IN NACL 200 MCG/50ML IV SOLN: 0.4000 ug/kg/h | INTRAVENOUS | Status: DC

## 2021-11-25 MED ORDER — HYDROMORPHONE BOLUS VIA INFUSION
1.0000 mg | INTRAVENOUS | Status: DC | PRN
  Administered 2021-11-25 (×2): 1 mg via INTRAVENOUS
  Filled 2021-11-25: qty 1

## 2021-11-25 MED ORDER — LORAZEPAM 2 MG/ML IJ SOLN
1.0000 mg | INTRAMUSCULAR | Status: DC | PRN
  Administered 2021-11-25: 1 mg via INTRAVENOUS
  Filled 2021-11-25: qty 1

## 2021-11-25 MED ORDER — GLYCOPYRROLATE 0.2 MG/ML IJ SOLN
0.4000 mg | INTRAMUSCULAR | Status: DC | PRN
  Administered 2021-11-25: 0.4 mg via INTRAVENOUS
  Filled 2021-11-25 (×2): qty 2

## 2021-11-25 MED ORDER — SODIUM CHLORIDE 0.9 % IV SOLN
0.5000 mg/h | INTRAVENOUS | Status: DC
  Administered 2021-11-25: 0.5 mg/h via INTRAVENOUS
  Filled 2021-11-25: qty 2.5

## 2021-11-25 MED ORDER — INSULIN ASPART 100 UNIT/ML IJ SOLN
10.0000 [IU] | Freq: Once | INTRAMUSCULAR | Status: AC
  Administered 2021-11-25: 10 [IU] via SUBCUTANEOUS

## 2021-11-25 MED ORDER — ACETYLCYSTEINE 20 % IN SOLN
2.0000 mL | Freq: Four times a day (QID) | RESPIRATORY_TRACT | Status: DC
  Administered 2021-11-25: 2 mL via RESPIRATORY_TRACT
  Filled 2021-11-25: qty 4

## 2021-11-25 MED ORDER — BIOTENE DRY MOUTH MT LIQD: 15.0000 mL | OROMUCOSAL | Status: DC | PRN

## 2021-11-25 MED ORDER — HALOPERIDOL LACTATE 2 MG/ML PO CONC: 0.5000 mg | ORAL | Status: DC | PRN

## 2021-11-25 MED ORDER — HALOPERIDOL LACTATE 5 MG/ML IJ SOLN: 0.5000 mg | INTRAMUSCULAR | Status: DC | PRN

## 2021-11-26 LAB — CULTURE, BLOOD (ROUTINE X 2)
Culture: NO GROWTH
Culture: NO GROWTH

## 2021-12-16 NOTE — Death Summary Note (Signed)
DEATH SUMMARY   Patient Details  Name: Daniel Ochoa MRN: [id number] DOB: 01/29/1941 SHF:WYOVZCHY, Ermalene Searing, MD Admission/Discharge Information   Admit Date:  12-09-2021  Date of Death: Date of Death: 12-13-2021  Time of Death: Time of Death: 1950  Length of Stay: 4   Principle Cause of death: Severe multifocal pneumonia, POA  Hospital Diagnoses: Principal Problem:   Acute respiratory failure with hypoxia ([place]) Active Problems:   Diabetes mellitus type 2 with complications ([place])   Essential hypertension   OSA on CPAP   Chronic kidney disease, stage IV (severe) (HCC)   Alzheimer's disease ([place])   Hypothyroidism   Class 1 obesity   Hospital Course: Daniel Ochoa is a 81 y.o. male with medical history significant of DVT, pulmonary embolism off oral anticoagulation, osteoarthritis, severe kyphosis, CKD 3B, type 2 diabetes, diabetic peripheral neuropathy, hypertension, hypothyroidism, obesity, OSA on CPAP, unspecified skin cancer who was recently discharged from [facility] on 11/17/21 to SNF for physical rehab after admission for TIA, E. coli UTI.  He is returning to [facility] ED on 12/09/21 from SNF via EMS due to shortness of breath, acute hypoxia, productive cough, and generalized weakness.     Work-up revealed left lower lobe HCAP initially, severe acute hypoxic respiratory failure, elevated troponin, concern for aspiration with dysphagia, possible pulmonary embolism, bilateral upper extremity superficial venous thrombosis, AKI on CKD 3B, and severe right sided pneumonia.  Palliative care team, ID, pulmonary, and cardiology were consulted.  CT head done on 11/24/2021 revealed remote left cerebral CVA.  CT chest without contrast showed severe right-sided pneumonia.   On 2021-12-13, patient's wife made decision for comfort care only.  On Dec 13, 2021 at 1950 p.m. patient expired.  His wife, palliative care medicine provider Dr. Domingo Cocking, and his bedside RN were present in the room.  Assessment  and Plan: No notes have been filed under this hospital service. Service: Hospitalist  Assessment: -Acute respiratory failure with hypoxia ([place]) secondary to multifocal HCAP, POA. -Multifocal HCAP with concern for aspiration, POA. -Severe right-sided pneumonia, POA -Remote left cerebral CVA, POA -New dysphagia, POA -Bilateral upper extremity superficial venous thrombosis seen on Doppler ultrasound done on 11/23/2021.  -Presumptive pulmonary embolism -Recent TIA -Elevated troponin suspect demand ischemia in the setting of severe hypoxia -Acute on chronic CKD 3B/concern for developing uremia -Moderate protein calorie malnutrition -Chronic diastolic CHF -Essential hypertension -Prediabetes with hyperglycemia (HCC) -OSA on CPAP -Hypothyroidism -Chronic anxiety -Class 1 obesity -Physical debility -Ambulatory dysfunction   Plan: Comfort care only measures.    CODE STATUS: DNR/comfort care.    Consultations: Palliative care medicine, critical care medicine, infectious disease and cardiology.  The results of significant diagnostics from this hospitalization (including imaging, microbiology, ancillary and laboratory) are listed below for reference.   Significant Diagnostic Studies: DG Chest 2 View  Result Date: 11/10/2021 CLINICAL DATA:  Altered mental status EXAM: CHEST - 2 VIEW COMPARISON:  2015 FINDINGS: Shallow inspiration with low lung volumes. Patchy density at the left lung base. No significant pleural effusion. No pneumothorax. Cardiomediastinal contours are within normal limits. No acute osseous abnormality. Surgical clips overlie the right superior mediastinum or lower neck. IMPRESSION: Patchy atelectasis/consolidation at the left lung base. Electronically Signed   By: Macy Mis M.D.   On: 11/10/2021 13:50   DG Shoulder Right  Result Date: 11/10/2021 CLINICAL DATA:  Right elbow and shoulder pain. EXAM: RIGHT SHOULDER - 2+ VIEW COMPARISON:  No pertinent prior exams  available for comparison. FINDINGS: There is normal bony alignment. No  evidence of acute osseous or articular abnormality. Glenohumeral joint osteoarthrosis. Surgical clips within the right lower neck. IMPRESSION: No evidence of acute osseous or articular abnormality. Clinical humeral joint osteoarthrosis. Electronically Signed   By: Kellie Simmering D.O.   On: 11/10/2021 13:50   DG Elbow Complete Right  Result Date: 11/10/2021 CLINICAL DATA:  RIGHT elbow and shoulder pain, altered mental status in a 81 year old male. EXAM: RIGHT ELBOW - COMPLETE 3+ VIEW COMPARISON:  None FINDINGS: No visible fracture. Degenerative changes about the elbow. Question mild elevation of the anterior fat pad but without distension/anterior bowing. No posterior fat pad. IMPRESSION: No visible fracture. Question mild elevation of the anterior fat pad but without anterior bowing of the fat pad. Favored to be related to degenerative changes, difficult to entirely exclude the possibility of occult fracture, most commonly of the radial head in a skeletally mature patient. No posterior fat pad elevation, correlate with any point tenderness over the radial head. Electronically Signed   By: Zetta Bills M.D.   On: 11/10/2021 13:52   CT HEAD WO CONTRAST (5MM)  Result Date: 11/24/2021 CLINICAL DATA:  Neuro deficit, acute, stroke suspected EXAM: CT HEAD WITHOUT CONTRAST TECHNIQUE: Contiguous axial images were obtained from the base of the skull through the vertex without intravenous contrast. RADIATION DOSE REDUCTION: This exam was performed according to the departmental dose-optimization program which includes automated exposure control, adjustment of the mA and/or kV according to patient size and/or use of iterative reconstruction technique. COMPARISON:  CT November 15, 2021. FINDINGS: Motion limited study. Brain: No evidence of acute infarction, hemorrhage, hydrocephalus, extra-axial collection or mass lesion/mass effect. Remote left cerebellar  infarct. Patchy white matter hypoattenuation, nonspecific but compatible with chronic microvascular ischemic disease. Vascular: Calcific intracranial atherosclerosis. No hyperdense vessel identified. Skull: No evidence of acute fracture. Frontal calvarium is incompletely imaged. Prior suboccipital craniectomy. Sinuses/Orbits: The visualized sinuses are clear. Unremarkable orbits. No mastoid effusions. Other: No mastoid effusions. IMPRESSION: 1. No evidence of acute intracranial abnormality on this motion limited study. 2. Remote left cerebellar infarct, chronic microvascular ischemic disease and prior suboccipital craniectomy. Electronically Signed   By: Margaretha Sheffield M.D.   On: 11/24/2021 18:36   CT HEAD WO CONTRAST (5MM)  Result Date: 11/15/2021 CLINICAL DATA:  Neuro deficit EXAM: CT HEAD WITHOUT CONTRAST TECHNIQUE: Contiguous axial images were obtained from the base of the skull through the vertex without intravenous contrast. RADIATION DOSE REDUCTION: This exam was performed according to the departmental dose-optimization program which includes automated exposure control, adjustment of the mA and/or kV according to patient size and/or use of iterative reconstruction technique. COMPARISON:  CT head 11/10/2021 FINDINGS: Limited study due to artifacts and motion. Brain: No acute intracranial hemorrhage, mass effect, or herniation. No extra-axial fluid collections. No evidence of acute territorial infarct. No hydrocephalus. Cortical volume loss and chronic microvascular ischemic changes. Vascular: Calcified plaques in the carotid siphons. Skull: No acute fracture. Hyperostosis frontalis interna. Previous suboccipital craniotomy. Sinuses/Orbits: No acute finding. Other: None. IMPRESSION: Chronic changes with no acute intracranial process identified. Electronically Signed   By: Ofilia Neas M.D.   On: 11/15/2021 14:12   CT HEAD WO CONTRAST (5MM)  Result Date: 11/10/2021 CLINICAL DATA:  Neuro deficit,  acute, stroke suspected left leg weakness / R arm weakness / slurred speech for 2 days EXAM: CT HEAD WITHOUT CONTRAST TECHNIQUE: Contiguous axial images were obtained from the base of the skull through the vertex without intravenous contrast. RADIATION DOSE REDUCTION: This exam was performed according to the  departmental dose-optimization program which includes automated exposure control, adjustment of the mA and/or kV according to patient size and/or use of iterative reconstruction technique. COMPARISON:  None. FINDINGS: Suboptimal evaluation due to artifact. Brain: There is no acute intracranial hemorrhage, mass effect, or edema. Gray-white differentiation is preserved. There is no extra-axial fluid collection. Ventricles and sulci are within normal limits in size and configuration. Patchy low-density in the supratentorial white matter is nonspecific but may reflect mild chronic microvascular ischemic changes. Age-indeterminate small vessel infarct at the right caudothalamic groove. Vascular: There is atherosclerotic calcification at the skull base. Skull: Prior suboccipital craniectomy. Sinuses/Orbits: No acute finding. Other: None. IMPRESSION: Degraded by artifact. No acute intracranial hemorrhage or mass effect. Age-indeterminate small vessel infarct at the right caudothalamic groove. Mild chronic microvascular ischemic changes. Electronically Signed   By: Macy Mis M.D.   On: 11/10/2021 13:21   CT CHEST WO CONTRAST  Result Date: 11/24/2021 CLINICAL DATA:  Pneumonia EXAM: CT CHEST WITHOUT CONTRAST TECHNIQUE: Multidetector CT imaging of the chest was performed following the standard protocol without IV contrast. RADIATION DOSE REDUCTION: This exam was performed according to the departmental dose-optimization program which includes automated exposure control, adjustment of the mA and/or kV according to patient size and/or use of iterative reconstruction technique. COMPARISON:  Chest radiographs done on  11/23/2021 FINDINGS: Cardiovascular: Coronary artery calcifications are seen. There is dense calcification in the mitral annulus. There are scattered calcifications in the thoracic aorta. There is ectasia of main pulmonary artery measuring 3.8 cm suggesting pulmonary arterial hypertension. Mediastinum/Nodes: No significant lymphadenopathy seen. Surgical clips are seen in the right thyroid bed. Lungs/Pleura: There is large infiltrate in the right lower lobe. There are patchy alveolar infiltrates in the right upper lobe and right middle lobe. There are small linear subpleural densities in the left lower lung fields. Breathing motion artifacts limit evaluation of the left lung. There is possible bilateral pleural effusions, more so on the right side. Severe beam hardening artifacts in the lower chest and upper abdomen limit evaluation of pleural effusion. There is no pneumothorax. Upper Abdomen: Unremarkable. Musculoskeletal: Diffuse calcifications are seen in the anterior spinal ligament. Please correlate for possible ankylosing spondylitis. IMPRESSION: There is large infiltrate in the right lower lobe. There are patchy alveolar infiltrates in the right upper lobe and right middle lobe. Findings suggest multifocal pneumonia. Small patchy infiltrate is seen in the posterior left lower lung fields, possibly residual atelectasis/pneumonia. Evaluation for pleural effusions is limited by severe beam hardening artifacts possibly caused by patient's upper extremities within the CT gantry. As far as seen there is no evidence of large pleural effusions. There is ectasia of main pulmonary artery suggesting pulmonary arterial hypertension. Coronary artery calcifications are seen. Other findings as described in the body of the report. Electronically Signed   By: Elmer Picker M.D.   On: 11/24/2021 18:42   DG Chest Port 1 View  Result Date: 12-25-21 CLINICAL DATA:  Respiratory failure with hypoxia.  Pneumonia. EXAM:  PORTABLE CHEST 1 VIEW COMPARISON:  11/23/2021 FINDINGS: Airspace disease in the right lung consistent with history of pneumonia. There is progression since the previous study. Left lung is clear. Probable small right pleural effusion. Heart size is normal for technique. Surgical clips in the base of the neck. IMPRESSION: Increasing airspace infiltration in the right lung with small right pleural effusion, likely pneumonia. Electronically Signed   By: Lucienne Capers M.D.   On: 12/25/2021 02:38   DG CHEST PORT 1 VIEW  Result Date: 11/23/2021  CLINICAL DATA:  81 year old male with history of hypoxia. EXAM: PORTABLE CHEST 1 VIEW COMPARISON:  Chest x-ray 12/06/2021. FINDINGS: Lung volumes are low. Some improvement in aeration in the left mid to lower lung is noted. However, widespread areas of interstitial prominence and patchy nodular appearing airspace consolidation is noted throughout the right lung on today's study, most evident in the right upper lobe. No definite pleural effusions. No pneumothorax. No evidence of pulmonary edema. Heart size is within normal limits. The patient is rotated to the right on today's exam, resulting in distortion of the mediastinal contours and reduced diagnostic sensitivity and specificity for mediastinal pathology. Surgical clips project over the right side of the thoracic inlet, likely from prior thyroid surgery. IMPRESSION: 1. Worsening multilobar pneumonia in the right lung, most evident in the right upper lobe. 2. Improving pneumonia in the left lung. Electronically Signed   By: Vinnie Langton M.D.   On: 11/23/2021 06:19   DG Chest Port 1 View  Result Date: 11/16/2021 CLINICAL DATA:  Questionable sepsis.  Evaluate for abnormality. EXAM: PORTABLE CHEST 1 VIEW COMPARISON:  11/10/2021. FINDINGS: Increased densities in the left hilum and left lower chest. Overall, low lung volumes. Again noted are surgical clips in the right lower neck or upper chest region. Heart size is  upper limits of normal but stable. IMPRESSION: Increased densities in the left hilum and left lower chest. Findings are concerning for pneumonia. Followup PA and lateral chest X-ray is recommended in 3-4 weeks following trial of antibiotic therapy to ensure resolution and exclude underlying malignancy. Electronically Signed   By: Markus Daft M.D.   On: 12/08/2021 11:50   DG Finger Thumb Right  Result Date: 11/15/2021 CLINICAL DATA:  Distal thumb pain and swelling for 3 days. EXAM: RIGHT THUMB 2+V COMPARISON:  None. FINDINGS: Severe thumb carpometacarpal, moderate triscaphe joint, moderate to severe thumb interphalangeal, and mild thumb metacarpophalangeal joint space narrowing. Thumb carpometacarpal and thumb interphalangeal subchondral sclerosis and peripheral osteophytosis. No acute fracture is seen. No dislocation. IMPRESSION: No acute fracture. Osteoarthritis greatest within the thumb carpometacarpal and interphalangeal joints. Electronically Signed   By: Yvonne Kendall M.D.   On: 11/15/2021 11:55   DG Swallowing Func-Speech Pathology  Result Date: 11/22/2021 Table formatting from the original result was not included. Objective Swallowing Evaluation: Type of Study: Bedside Swallow Evaluation  Patient Details Name: Christpher Stogsdill MRN: [id number] Date of Birth: 11/17/1940 Today's Date: 11/22/2021 Time: SLP Start Time (ACUTE ONLY): 1015 -SLP Stop Time (ACUTE ONLY): 1045 SLP Time Calculation (min) (ACUTE ONLY): 30 min Past Medical History: Past Medical History: Diagnosis Date  Acute deep vein thrombosis (DVT) of tibial vein of right lower extremity ([place]) 04/13/2014  Acute pulmonary embolism ([place]) 04/15/2014  Arthritis   Chronic fatigue   Chronic kidney disease, stage IV (severe) (HCC)   Complication of anesthesia   one surgery took 4 people to hold him down 2000  Constipation   Diabetic peripheral neuropathy ([place]) 10/26/2015  DM (diabetes mellitus), type 2, uncontrolled, with renal complications   [facility]   DVT (deep venous thrombosis) ([place])   RLE DVT with intermediate risk VQ scan for PE 03/2014  Dyspnea   Essential hypertension   Fibromyalgia   Hypothyroidism   Hypoxia 04/13/2014  Neuropathy   Obesity   OSA on CPAP   Pulmonary embolism ([place]) 04/13/2014  VQ 04/13/14 intermediate assoc with R DVT > on coumadin since Echo 04/14/14 > PA peak pressure: 89 mm Hg (S).  Past Surgical History: Past Surgical  History: Procedure Laterality Date  AV FISTULA PLACEMENT Left 04/18/2017  Procedure: ARTERIOVENOUS (AV) FISTULA CREATION;  Surgeon: Angelia Mould, MD;  Location: [place];  Service: Vascular;  Laterality: Left;  [place] Right 11/14/2020  Procedure: BASCILIC VEIN TRANSPOSITION 1ST STAGE RIGHT;  Surgeon: Angelia Mould, MD;  Location: [place];  Service: Vascular;  Laterality: Right;  [place] Right 01/05/2021  Procedure: RIGHT SECOND STAGE [place];  Surgeon: Serafina Mitchell, MD;  Location: [facility] OR;  Service: Vascular;  Laterality: Right;  CERVICAL LAMINECTOMY    LIGATION OF ARTERIOVENOUS  FISTULA Left 06/01/2021  Procedure: LIGATION OF LEFT ARM FISTULA;  Surgeon: Serafina Mitchell, MD;  Location: [facility] OR;  Service: Vascular;  Laterality: Left;  LUMBAR FUSION    x 2  ROTATOR CUFF REPAIR Right   THYROIDECTOMY, PARTIAL   HPI: 81 y/o male presented to ED from SNF (after having been at SNF only few days) with chest congestion, cough and found to have pna.  Pt recently discharged from [facility] after admission for concern for TIA = due to 2 days of slurred speech and weakness. Unable to obtain MRI due to kyphosis per notes. CT head unremarkable. Patient with recent UTI x 2-3 weeks ago.  PMH: DM type 2, Alzheimer's, Afib, CKD stage IV, DVT, unspecified skin cancer, constipation, peripheral neuropathy, chronic fatigue, fibromyalgia, OEA.  Swallow evaluation conducted during prior admit without evidence of dysphagia and regular/thin diet was advised.  CXR showed increased  densities in left hilum and left lower chest.   Wife admits pt has been coughing on occasion with liquids more than foods over the last four months.  She states pt mostly slept at home prior to SNF admit due to his medical diagnoses. He remains a full code at this time.  Subjective: pt awake in flouro chair, RN present with pt  Recommendations for follow up therapy are one component of a multi-disciplinary discharge planning process, led by the attending physician.  Recommendations may be updated based on patient status, additional functional criteria and insurance authorization. Assessment / Plan / Recommendation Clinical Impressions 11/22/2021 Clinical Impression Patient presents with moderately severe oropharyngeal dysphagia with sensorimotor deficits.  Impaired oral transiting/bolus propulsion noted due to weakness.  Pharyngeal swallow trigger variable - with thin and nectar liquids at pyriform sinus which allowed aspiration during the swallow.  2nd bolus of pudding *later in the study* triggered at vallecular space at 2 seconds and cracker triggered at 5 seconds. Pt impaired pharyngeal motility -allows vallecular and pyriform sinus retention, that mixed with secretions.  Cues to swallow not consistently effective - however when cued to throat clear or cough, pt produced cough and reflexively swallowed. He however does not fully clear retention in pharynx.   Significant aspiration with thin and nectar via cup/straw noted - with cough *likely when aspirates reached the carina* but cough was not effective to clear.  Postures not tested due to patients excessive limited head ROM.    Pt is elevated aspiration and malnutrition risk due to his level of dysphagia. Currently - recommend consider palliative consult to establish [place] given pts recent hospital stay, progressive neuro diagnosis and deconditioning.  At this time, recommend consider CREAMY puree and nectar liquid diet with strict precautions to mitigate  aspiration.  Recommend pt be allowed ice chips and tsps of thin water any time.  He only coughed during the MBS when he aspirated - thus if he is coughing with intake, he is definitively  aspirating.  Frequent small meals may help maximize nutrition with least risk. Skilled SLP included determining effective compensation strategies and pt education.  Recommended pt work on strengthening his cough and hock expectoration strength for maximal airway protection. Per prior clinical swallow evaluation, pt's swallow function currently appears significantly worse - ? source? SLP Visit Diagnosis Dysphagia, oropharyngeal phase (R13.12) Attention and concentration deficit following -- Frontal lobe and executive function deficit following -- Impact on safety and function Moderate aspiration risk;Risk for inadequate nutrition/hydration   Treatment Recommendations 11/22/2021 Treatment Recommendations Therapy as outlined in treatment plan below   Prognosis 11/22/2021 Prognosis for Safe Diet Advancement Guarded Barriers to Reach Goals Severity of deficits;Cognitive deficits;Other (Comment) Barriers/Prognosis Comment -- Diet Recommendations 11/22/2021 SLP Diet Recommendations Dysphagia 1 (Puree) solids;Nectar thick liquid;Ice chips PRN after oral care Liquid Administration via [place];No straw Medication Administration -- Compensations Slow rate;Small sips/bites;Other (Comment) Postural Changes --   Other Recommendations 11/22/2021 Recommended Consults -- Oral Care Recommendations Oral care QID Other Recommendations Order thickener from pharmacy Follow Up Recommendations Skilled nursing-short term rehab (<3 hours/day) Assistance recommended at discharge Frequent or constant Supervision/Assistance Functional Status Assessment Patient has had a recent decline in their functional status and demonstrates the ability to make significant improvements in function in a reasonable and predictable amount of time. Frequency and Duration  11/22/2021  Speech Therapy Frequency (ACUTE ONLY) min 2x/week Treatment Duration 1 week   Oral Phase 11/22/2021 Oral Phase Impaired Oral - Pudding Teaspoon -- Oral - Pudding Cup -- Oral - Honey Teaspoon Weak lingual manipulation;Reduced posterior propulsion Oral - Honey Cup -- Oral - Nectar Teaspoon Weak lingual manipulation;Reduced posterior propulsion Oral - Nectar Cup Weak lingual manipulation;Reduced posterior propulsion Oral - Nectar Straw Weak lingual manipulation;Reduced posterior propulsion Oral - Thin Teaspoon Weak lingual manipulation;Reduced posterior propulsion Oral - Thin Cup Weak lingual manipulation;Reduced posterior propulsion Oral - Thin Straw Weak lingual manipulation;Reduced posterior propulsion Oral - Puree Weak lingual manipulation;Reduced posterior propulsion Oral - Mech Soft Weak lingual manipulation;Impaired mastication;Reduced posterior propulsion Oral - Regular -- Oral - Multi-Consistency -- Oral - Pill -- Oral Phase - Comment --  Pharyngeal Phase 11/22/2021 Pharyngeal Phase Impaired Pharyngeal- Pudding Teaspoon -- Pharyngeal -- Pharyngeal- Pudding Cup -- Pharyngeal -- Pharyngeal- Honey Teaspoon -- Pharyngeal -- Pharyngeal- Honey Cup -- Pharyngeal -- Pharyngeal- Nectar Teaspoon Reduced pharyngeal peristalsis;Pharyngeal residue - valleculae;Pharyngeal residue - pyriform;Delayed swallow initiation-pyriform sinuses Pharyngeal Material does not enter airway Pharyngeal- Nectar Cup Delayed swallow initiation-pyriform sinuses;Reduced pharyngeal peristalsis;Pharyngeal residue - valleculae;Pharyngeal residue - pyriform;Penetration/Aspiration during swallow;Significant aspiration (Amount) Pharyngeal Material enters airway, passes BELOW cords without attempt by patient to eject out (silent aspiration) Pharyngeal- Nectar Straw -- Pharyngeal -- Pharyngeal- Thin Teaspoon Delayed swallow initiation-pyriform sinuses;Reduced pharyngeal peristalsis;Pharyngeal residue - valleculae;Pharyngeal residue - pyriform Pharyngeal  Material does not enter airway Pharyngeal- Thin Cup Delayed swallow initiation-pyriform sinuses;Reduced pharyngeal peristalsis;Reduced laryngeal elevation;Reduced airway/laryngeal closure Pharyngeal Material enters airway, passes BELOW cords without attempt by patient to eject out (silent aspiration) Pharyngeal- Thin Straw Delayed swallow initiation-pyriform sinuses;Reduced pharyngeal peristalsis;Reduced laryngeal elevation;Reduced airway/laryngeal closure;Significant aspiration (Amount) Pharyngeal Material enters airway, passes BELOW cords without attempt by patient to eject out (silent aspiration);Material enters airway, passes BELOW cords and not ejected out despite cough attempt by patient Pharyngeal- Puree Pharyngeal residue - valleculae;Reduced pharyngeal peristalsis;Reduced laryngeal elevation;Reduced airway/laryngeal closure Pharyngeal Material does not enter airway Pharyngeal- Mechanical Soft Pharyngeal residue - valleculae;Reduced tongue base retraction;Reduced pharyngeal peristalsis;Reduced laryngeal elevation;Reduced airway/laryngeal closure Pharyngeal Material does not enter airway Pharyngeal- Regular -- Pharyngeal -- Pharyngeal- Multi-consistency -- Pharyngeal -- Pharyngeal-  Pill -- Pharyngeal -- Pharyngeal Comment --  Cervical Esophageal Phase  11/22/2021 Cervical Esophageal Phase Impaired Pudding Teaspoon -- Pudding Cup -- Honey Teaspoon -- Honey Cup -- Nectar Teaspoon -- Nectar Cup -- Nectar Straw -- Thin Teaspoon -- Thin Cup -- Thin Straw -- Puree -- Mechanical Soft -- Regular -- Multi-consistency -- Pill -- Cervical Esophageal Comment Upon esophageal sweep, pt appeared with retention and retrograde propulsion without awareness.  MBS does not diagnosis esophageal dysphagia. Kathleen Lime, MS [facility] SLP Acute Rehab Services Office [phone] Pager [phone] Macario Golds 11/22/2021, 11:46 AM                     VAS US DUPLEX DIALYSIS ACCESS (AVF, AVG)  Result Date: 11/16/2021 DIALYSIS ACCESS  Patient Name:  [facility]  Date of Exam:   11/16/2021 Medical Rec #: [id number]            Accession #:    [id number] Date of Birth: [birth year]           Patient Gender: M Patient Age:   33 years Exam Location:  [facility] Procedure:      VAS US DUPLEX DIALYSIS ACCESS (AVF, AVG) Referring Phys: Dagoberto Ligas --------------------------------------------------------------------------------  Reason for Exam: Edema. Access Site: Right Upper Extremity. Access Type: Radial-cephalic AVF. Comparison Study: No prior study Performing Technologist: Maudry Mayhew MHA, RDMS, RVT, RDCS  Examination Guidelines: A complete evaluation includes B-mode imaging, spectral Doppler, color Doppler, and power Doppler as needed of all accessible portions of each vessel. Unilateral testing is considered an integral part of a complete examination. Limited examinations for reoccurring indications may be performed as noted.  Findings: +--------------------+----------+-----------------+--------+  AVF                  PSV (cm/s) Flow Vol (mL/min) Comments  +--------------------+----------+-----------------+--------+  Native artery inflow    433            371                  +--------------------+----------+-----------------+--------+  AVF Anastomosis         766                                 +--------------------+----------+-----------------+--------+  +------------+----------+-------------+----------+--------+  OUTFLOW VEIN PSV (cm/s) Diameter (cm) Depth (cm) Describe  +------------+----------+-------------+----------+--------+  Prox Forearm    122         0.74         1.30              +------------+----------+-------------+----------+--------+  Mid Forearm     227         0.62         0.39              +------------+----------+-------------+----------+--------+  Dist Forearm    530         0.60         0.22              +------------+----------+-------------+----------+--------+   Summary: Patent arteriovenous fistula.  *See table(s) above for measurements and observations.  Diagnosing physician: Jamelle Haring Electronically signed by Jamelle Haring on 11/16/2021 at 3:47:23 PM.    --------------------------------------------------------------------------------   Final    ECHOCARDIOGRAM COMPLETE  Result Date: 11/22/2021    ECHOCARDIOGRAM REPORT   Patient Name:   [facility] Date of Exam: 11/22/2021 Medical Rec #:  [id number]  Height:       73.0 in Accession #:    [id number]          Weight:       241.6 lb Date of Birth:  Jul 11, 1941          BSA:          2.332 m Patient Age:    33 years            BP:           140/39 mmHg Patient Gender: M                   HR:           61 bpm. Exam Location:  Inpatient Procedure: 2D Echo Indications:    Abnormal EKG, Acute respiratory distress  History:        Patient has prior history of Echocardiogram examinations, most                 recent 04/14/2014. Signs/Symptoms:Shortness of Breath; Risk                 Factors:Diabetes and Hypertension.  Sonographer:    Arlyss Gandy Referring Phys: [id number] Parchment  Sonographer Comments: Image acquisition challenging due to patient body habitus and supine. IMPRESSIONS  1. Left ventricular ejection fraction, by estimation, is 60 to 65%. The left ventricle has normal function. The left ventricle has no regional wall motion abnormalities. There is mild concentric left ventricular hypertrophy. Left ventricular diastolic parameters are consistent with Grade II diastolic dysfunction (pseudonormalization).  2. Right ventricular systolic function is normal. The right ventricular size is moderately enlarged. There is normal pulmonary artery systolic pressure.  3. Left atrial size was moderately dilated.  4. Right atrial size was mildly dilated.  5. The mitral valve was not well visualized. Trivial mitral valve regurgitation. No evidence of mitral stenosis. Moderate mitral annular calcification.  6. The aortic valve is tricuspid. There is  mild calcification of the aortic valve. There is mild thickening of the aortic valve. Aortic valve regurgitation is not visualized. Aortic valve sclerosis is present, with no evidence of aortic valve stenosis. Comparison(s): No significant change from prior study. FINDINGS  Left Ventricle: Left ventricular ejection fraction, by estimation, is 60 to 65%. The left ventricle has normal function. The left ventricle has no regional wall motion abnormalities. The left ventricular internal cavity size was normal in size. There is  mild concentric left ventricular hypertrophy. Left ventricular diastolic parameters are consistent with Grade II diastolic dysfunction (pseudonormalization). Right Ventricle: The right ventricular size is moderately enlarged. Right vetricular wall thickness was not well visualized. Right ventricular systolic function is normal. There is normal pulmonary artery systolic pressure. The tricuspid regurgitant velocity is 2.39 m/s, and with an assumed right atrial pressure of 8 mmHg, the estimated right ventricular systolic pressure is 28.3 mmHg. Left Atrium: Left atrial size was moderately dilated. Right Atrium: Right atrial size was mildly dilated. Pericardium: There is no evidence of pericardial effusion. Mitral Valve: The mitral valve was not well visualized. There is moderate thickening of the mitral valve leaflet(s). There is moderate calcification of the mitral valve leaflet(s). Moderate mitral annular calcification. Trivial mitral valve regurgitation. No evidence of mitral valve stenosis. MV peak gradient, 5.2 mmHg. The mean mitral valve gradient is 2.0 mmHg. Tricuspid Valve: The tricuspid valve is grossly normal. Tricuspid valve regurgitation is trivial. No evidence of tricuspid stenosis. Aortic Valve: The aortic valve is tricuspid. There is  mild calcification of the aortic valve. There is mild thickening of the aortic valve. Aortic valve regurgitation is not visualized. Aortic valve sclerosis  is present, with no evidence of aortic valve stenosis. Aortic valve mean gradient measures 3.0 mmHg. Aortic valve peak gradient measures 7.2 mmHg. Aortic valve area, by VTI measures 3.28 cm. Pulmonic Valve: The pulmonic valve was not well visualized. Pulmonic valve regurgitation is not visualized. No evidence of pulmonic stenosis. Aorta: The aortic arch was not well visualized and the aortic root and ascending aorta are structurally normal, with no evidence of dilitation. Venous: The inferior vena cava was not well visualized. IAS/Shunts: The interatrial septum was not well visualized.  LEFT VENTRICLE PLAX 2D LVIDd:         4.80 cm   Diastology LVIDs:         3.05 cm   LV e' medial:    4.68 cm/s LV PW:         1.40 cm   LV E/e' medial:  22.4 LV IVS:        1.40 cm   LV e' lateral:   7.94 cm/s LVOT diam:     2.30 cm   LV E/e' lateral: 13.2 LV SV:         96 LV SV Index:   41 LVOT Area:     4.15 cm  RIGHT VENTRICLE RV Basal diam:  5.00 cm RV Mid diam:    4.70 cm RV S prime:     12.40 cm/s TAPSE (M-mode): 2.0 cm LEFT ATRIUM             Index        RIGHT ATRIUM           Index LA diam:        4.00 cm 1.72 cm/m   RA Area:     18.80 cm LA Vol (A2C):   96.5 ml 41.39 ml/m  RA Volume:   56.00 ml  24.02 ml/m LA Vol (A4C):   95.8 ml 41.09 ml/m LA Biplane Vol: 97.5 ml 41.82 ml/m  AORTIC VALVE AV Area (Vmax):    2.95 cm AV Area (Vmean):   3.28 cm AV Area (VTI):     3.28 cm AV Vmax:           134.00 cm/s AV Vmean:          82.000 cm/s AV VTI:            0.294 m AV Peak Grad:      7.2 mmHg AV Mean Grad:      3.0 mmHg LVOT Vmax:         95.30 cm/s LVOT Vmean:        64.800 cm/s LVOT VTI:          0.232 m LVOT/AV VTI ratio: 0.79  AORTA Ao Root diam: 3.10 cm Ao Asc diam:  3.50 cm MITRAL VALVE                TRICUSPID VALVE MV Area (PHT): 2.54 cm     TR Peak grad:   22.8 mmHg MV Area VTI:   2.07 cm     TR Vmax:        239.00 cm/s MV Peak grad:  5.2 mmHg MV Mean grad:  2.0 mmHg     SHUNTS MV Vmax:       1.14 m/s      Systemic VTI:  0.23 m MV Vmean:      70.2  cm/s    Systemic Diam: 2.30 cm MV Decel Time: 299 msec MV E velocity: 105.00 cm/s MV A velocity: 78.40 cm/s MV E/A ratio:  1.34 Buford Dresser MD Electronically signed by Buford Dresser MD Signature Date/Time: 11/22/2021/1:46:53 PM    Final    [place] RT UPPER EXTREM LTD SOFT TISSUE NON VASCULAR  Result Date: 11/13/2021 CLINICAL DATA:  Pain and swelling.  History of AV fistula. EXAM: ULTRASOUND right UPPER EXTREMITY LIMITED TECHNIQUE: Ultrasound examination of the upper extremity soft tissues was performed in the area of clinical concern. COMPARISON:  None. FINDINGS: Mild inflammation/edema is noted in the soft tissues but no discrete fluid collection to suggest an abscess or hematoma. No soft tissue mass is identified. IMPRESSION: Mild soft tissue edema but no discrete fluid collection or hematoma. Electronically Signed   By: Marijo Sanes M.D.   On: 11/13/2021 20:04   VAS [place] LOWER EXTREMITY VENOUS (DVT)  Result Date: 11/22/2021  Lower Venous DVT Study Patient Name:  [facility]  Date of Exam:   11/22/2021 Medical Rec #: [id number]            Accession #:    [id number] Date of Birth: September 21, 1940           Patient Gender: M Patient Age:   53 years Exam Location:  [facility] Procedure:      VAS [place] LOWER EXTREMITY VENOUS (DVT) Referring Phys: Irene Pap --------------------------------------------------------------------------------  Indications: Pain.  Risk Factors: None identified. Limitations: Poor ultrasound/tissue interface, patient positioning and body habitus. Comparison Study: No prior studies. Performing Technologist: Oliver Hum RVT  Examination Guidelines: A complete evaluation includes B-mode imaging, spectral Doppler, color Doppler, and power Doppler as needed of all accessible portions of each vessel. Bilateral testing is considered an integral part of a complete examination. Limited examinations for reoccurring indications may be  performed as noted. The reflux portion of the exam is performed with the patient in reverse Trendelenburg.  +---------+---------------+---------+-----------+----------+--------------+  RIGHT     Compressibility Phasicity Spontaneity Properties Thrombus Aging  +---------+---------------+---------+-----------+----------+--------------+  CFV       Full            Yes       Yes                                    +---------+---------------+---------+-----------+----------+--------------+  SFJ       Full                                                             +---------+---------------+---------+-----------+----------+--------------+  FV Prox   Full                                                             +---------+---------------+---------+-----------+----------+--------------+  FV Mid    Full                                                             +---------+---------------+---------+-----------+----------+--------------+  FV Distal Full                                                             +---------+---------------+---------+-----------+----------+--------------+  PFV       Full                                                             +---------+---------------+---------+-----------+----------+--------------+  POP       Full            Yes       Yes                                    +---------+---------------+---------+-----------+----------+--------------+  PTV       Full                                                             +---------+---------------+---------+-----------+----------+--------------+  PERO      Full                                                             +---------+---------------+---------+-----------+----------+--------------+   +---------+---------------+---------+-----------+----------+--------------+  LEFT      Compressibility Phasicity Spontaneity Properties Thrombus Aging  +---------+---------------+---------+-----------+----------+--------------+  CFV       Full             Yes       Yes                                    +---------+---------------+---------+-----------+----------+--------------+  SFJ       Full                                                             +---------+---------------+---------+-----------+----------+--------------+  FV Prox   Full                                                             +---------+---------------+---------+-----------+----------+--------------+  FV Mid    Full                                                             +---------+---------------+---------+-----------+----------+--------------+  FV Distal Full                                                             +---------+---------------+---------+-----------+----------+--------------+  PFV       Full                                                             +---------+---------------+---------+-----------+----------+--------------+  POP       Full            Yes       Yes                                    +---------+---------------+---------+-----------+----------+--------------+  PTV       Full                                                             +---------+---------------+---------+-----------+----------+--------------+  PERO      Full                                                             +---------+---------------+---------+-----------+----------+--------------+     Summary: RIGHT: - There is no evidence of deep vein thrombosis in the lower extremity. However, portions of this examination were limited- see technologist comments above.  - No cystic structure found in the popliteal fossa.  LEFT: - There is no evidence of deep vein thrombosis in the lower extremity. However, portions of this examination were limited- see technologist comments above.  - No cystic structure found in the popliteal fossa.  *See table(s) above for measurements and observations. Electronically signed by Monica Martinez MD on 11/22/2021 at 1:52:01 PM.    Final    VAS [place]  UPPER EXTREMITY VENOUS DUPLEX  Result Date: 11/23/2021 UPPER VENOUS STUDY  Patient Name:  [facility]  Date of Exam:   11/23/2021 Medical Rec #: [id number]            Accession #:    [id number] Date of Birth: 09/17/1941           Patient Gender: M Patient Age:   57 years Exam Location:  [facility] Procedure:      VAS [place] UPPER EXTREMITY VENOUS DUPLEX Referring Phys: Irene Pap --------------------------------------------------------------------------------  Indications: Edema Risk Factors: None identified. Limitations: Poor ultrasound/tissue interface and patient positioning. Comparison Study: No prior studies. Performing Technologist: Oliver Hum RVT  Examination Guidelines: A complete evaluation includes B-mode imaging, spectral Doppler, color Doppler, and power Doppler as needed of all accessible portions of each vessel. Bilateral testing is considered an integral part of a complete examination. Limited examinations for reoccurring indications may  be performed as noted.  Right Findings: +----------+------------+---------+-----------+----------+-----------------+  RIGHT      Compressible Phasicity Spontaneous Properties      Summary       +----------+------------+---------+-----------+----------+-----------------+  IJV            Full        Yes        Yes                                   +----------+------------+---------+-----------+----------+-----------------+  Subclavian     Full        Yes        Yes                                   +----------+------------+---------+-----------+----------+-----------------+  Axillary       Full        Yes        Yes                                   +----------+------------+---------+-----------+----------+-----------------+  Brachial       Full        Yes        Yes                                   +----------+------------+---------+-----------+----------+-----------------+  Radial         Full                                                          +----------+------------+---------+-----------+----------+-----------------+  Ulnar          Full                                                         +----------+------------+---------+-----------+----------+-----------------+  Cephalic       Full                                                         +----------+------------+---------+-----------+----------+-----------------+  Basilic        None                                      Age Indeterminate  +----------+------------+---------+-----------+----------+-----------------+ Superficial thrombus detected in the basilic vein is noted to begin in the mid upper arm and extend to the distal forearm.  Left Findings: +----------+------------+---------+-----------+----------+-------+  LEFT       Compressible Phasicity Spontaneous Properties Summary  +----------+------------+---------+-----------+----------+-------+  IJV            Full        Yes  Yes                         +----------+------------+---------+-----------+----------+-------+  Subclavian     Full        Yes        Yes                         +----------+------------+---------+-----------+----------+-------+  Axillary       Full        Yes        Yes                         +----------+------------+---------+-----------+----------+-------+  Brachial       Full        Yes        Yes                         +----------+------------+---------+-----------+----------+-------+  Radial         Full                                               +----------+------------+---------+-----------+----------+-------+  Ulnar          Full                                               +----------+------------+---------+-----------+----------+-------+  Cephalic       None                                       Acute   +----------+------------+---------+-----------+----------+-------+  Basilic        Full                                                +----------+------------+---------+-----------+----------+-------+ Superficial thrombus detected in the cephalic vein is noted to be only in the mid forearm.  Summary:  Right: No evidence of deep vein thrombosis in the upper extremity. Findings consistent with age indeterminate superficial vein thrombosis involving the right basilic vein.  Left: No evidence of deep vein thrombosis in the upper extremity. Findings consistent with acute superficial vein thrombosis involving the left cephalic vein.  *See table(s) above for measurements and observations.  Diagnosing physician: Jamelle Haring Electronically signed by Jamelle Haring on 11/23/2021 at 2:36:58 PM.    Final     Microbiology: Recent Results (from the past 240 hour(s))  Blood Culture (routine x 2)     Status: None   Collection Time: 12/04/2021 10:50 AM   Specimen: BLOOD  Result Value Ref Range Status   Specimen Description   Final    BLOOD BLOOD LEFT WRIST Performed at [facility] [address] [place], [place] [address]    Special Requests   Final    BOTTLES DRAWN AEROBIC AND ANAEROBIC Blood Culture results may not be optimal due to an excessive volume of blood received in culture bottles Performed at [facility], [place] [address], [place], [place] [address]    Culture   Final    NO GROWTH 5 DAYS Performed at [facility], [place] [address] [place], [place] [address]    Report Status 11/26/2021 FINAL  Final  Resp Panel by RT-PCR (Flu A&B, Covid) Nasopharyngeal Swab     Status: None   Collection Time: 11/20/2021 10:51 AM   Specimen: Nasopharyngeal Swab; Nasopharyngeal(NP) swabs in vial transport medium  Result Value Ref Range Status   SARS Coronavirus 2 by RT PCR NEGATIVE NEGATIVE Final    Comment: (NOTE) SARS-CoV-2 target nucleic acids are NOT DETECTED.  The SARS-CoV-2 RNA is generally detectable in upper respiratory specimens during the acute phase of infection. The lowest concentration of  SARS-CoV-2 viral copies this assay can detect is 138 copies/mL. A negative result does not preclude SARS-Cov-2 infection and should not be used as the sole basis for treatment or other patient management decisions. A negative result may occur with  improper specimen collection/handling, submission of specimen other than nasopharyngeal swab, presence of viral mutation(s) within the areas targeted by this assay, and inadequate number of viral copies(<138 copies/mL). A negative result must be combined with clinical observations, patient history, and epidemiological information. The expected result is Negative.  Fact Sheet for Patients:  EntrepreneurPulse.com.au  Fact Sheet for Healthcare Providers:  IncredibleEmployment.be  This test is no t yet approved or cleared by the [place] FDA and  has been authorized for detection and/or diagnosis of SARS-CoV-2 by FDA under an Emergency Use Authorization (EUA). This EUA will remain  in effect (meaning this test can be used) for the duration of the COVID-19 declaration under Section 564(b)(1) of the Act, 21 U.S.C.section 360bbb-3(b)(1), unless the authorization is terminated  or revoked sooner.       Influenza A by PCR NEGATIVE NEGATIVE Final   Influenza B by PCR NEGATIVE NEGATIVE Final    Comment: (NOTE) The Xpert Xpress SARS-CoV-2/FLU/RSV plus assay is intended as an aid in the diagnosis of influenza from Nasopharyngeal swab specimens and should not be used as a sole basis for treatment. Nasal washings and aspirates are unacceptable for Xpert Xpress SARS-CoV-2/FLU/RSV testing.  Fact Sheet for Patients: EntrepreneurPulse.com.au  Fact Sheet for Healthcare Providers: IncredibleEmployment.be  This test is not yet approved or cleared by the [place] FDA and has been authorized for detection and/or diagnosis of SARS-CoV-2 by FDA under an Emergency Use  Authorization (EUA). This EUA will remain in effect (meaning this test can be used) for the duration of the COVID-19 declaration under Section 564(b)(1) of the Act, 21 U.S.C. section 360bbb-3(b)(1), unless the authorization is terminated or revoked.  Performed at [facility], [place] [address] [place], [place] [address]   Blood Culture (routine x 2)     Status: None   Collection Time: 12/04/2021 10:55 AM   Specimen: BLOOD  Result Value Ref Range Status   Specimen Description   Final    BLOOD LEFT ANTECUBITAL Performed at [facility] [address], [place], [place] [address]    Special Requests   Final    BOTTLES DRAWN AEROBIC AND ANAEROBIC Blood Culture results may not be optimal due to an excessive volume of blood received in culture bottles Performed at [facility] [address] [place], [place] [address]    Culture   Final    NO GROWTH 5 DAYS Performed at [facility], [place] [address], [place], [place] [address]  Report Status 11/26/2021 FINAL  Final  MRSA Next Gen by PCR, Nasal     Status: None   Collection Time: 11/26/2021  2:05 PM   Specimen: Nasal Mucosa; Nasal Swab  Result Value Ref Range Status   MRSA by PCR Next Gen NOT DETECTED NOT DETECTED Final    Comment: (NOTE) The GeneXpert MRSA Assay (FDA approved for NASAL specimens only), is one component of a comprehensive MRSA colonization surveillance program. It is not intended to diagnose MRSA infection nor to guide or monitor treatment for MRSA infections. Test performance is not FDA approved in patients less than 51 years old. Performed at [facility], [place] [address], [place], [place] [address]     Time spent: 35 minutes  Signed: Kayleen Memos, DO

## 2021-12-16 NOTE — Progress Notes (Signed)
? ?NAME:  Daniel Ochoa, MRN:  [id number], DOB:  10/06/1940, LOS: 4 ?ADMISSION DATE:  11/23/2021, CONSULTATION DATE:  11/23/2021 ?REFERRING MD:  [place] Crane - [facility], CHIEF COMPLAINT:  Hypoxemia  ? ?History of Present Illness:  ?81 year old man who presented to [facility] 3/7 with SOB. PMHx significant for prior DVT/PE, OA, CKD IV, DM2, OSA on CPAP, dementia, DNR status. Recently admitted for UTI and TIA. ? ?In ED, CXR with L-sided opacities. Empiric HCAP/aspiration PNA coverage was initiated. On 3/9 patient was noted to have increasing O2 needs and was placed on BiPAP. There was concern for possible PE; however, unable to get CTA Chest due to poor renal function and patient unable to tolerate VQ scan due to severe kyphosis. Started on Lovenox for presumptive PE. ABG after BiPAP initiation demonstrated pH 7.47/pCO2 41/pO2 51.  Repeat D-dimer more elevated at 6.18. ? ?PCCM was consulted 3/9PM for increasing respiratory needs. ? ?Pertinent Medical History:  ?Dementia ?OSA ?TIA ?PE ?DVT ?CKD IV ? ?Significant Hospital Events: ?Including procedures, antibiotic start and stop dates in addition to other pertinent events   ?3/7 admitted to [facility] for HCAP  ?3/9 lovenox for presumptive PE ?3/9 BiPAP. Cards consulted for dilated RV. ID was consulted for worse CXR. Abx rec to mero. PCCM consulted for PaO2 51. ?3/10 Slightly improved O2 requirement; 10L [place] HFNC. Intermittently refusing BiPAP due to discomfort r/t kyphosis and anxiety.No significant events overnight. Refused BiPAP due to discomfort/anxiety. Remains very slow to respond, oriented x 2 (self, hospital). Low grade temp (Tmax 100.3) overnight. Uptrending WBC, continues on meropenem. Slightly net negative, -521m/24H ? ?Interim History / Subjective:  ? ? ?303/19/23- - on bipap.  CT scan of the chest shows large right-sided pneumonia with consolidation and collapse.  Currently on BiPAP.  Wife is at the bedside.  She expressed desire to go to comfort care.  She says that at home  patient has been walking and alert and oriented x3 and also has control of bowel and bladder but overall has failure to thrive and progressive weakness.  She says she has multiple medical problems.  Current status of illness is going back [facility]  She feels he is frail.  She feels that putting him through further aggressive care such as intubation chronic critical illness tracheostomy and further rehab is high excessive strain on him and her.  Significant caregiver burden.  She says he is physically and mentally exhausted.  She is expressing desire for comfort and continue DNR.  I did share with her that this is very appropriate. ? ? ? ?Objective:  ?Blood pressure (!) 127/42, pulse 94, temperature 100 ?F (37.8 ?C), temperature source Axillary, resp. rate (!) 38, height '6\' 1"'$  (1.854 m), weight 109.6 kg, SpO2 93 %. ?   ?FiO2 (%):  [75 %] 75 %  ? ?Intake/Output Summary (Last 24 hours) at 303/19/230915 ?Last data filed at 32023/03/190534 ?Gross per 24 hour  ?Intake 1222.35 ml  ?Output 275 ml  ?Net 947.35 ml  ? ?Filed Weights  ? 11/24/2021 1135 11/30/2021 1451  ?Weight: 111.6 kg 109.6 kg  ? ?Physical Examination: ?Frail elderly male with kyphosis.  He is on BiPAP.  He opens his eyes but does not interact.  He has edema diffusely.  He is mildly tachypneic.  Normal heart sounds. ? ?Resolved Hospital Problem List:   ? ? ?Assessment & Plan:  ?History of failure to thrive prior to admission and  present on admission ?Acute hypoxic respiratory failure due to HCAP, Aspiration PNA. PE is possible. CXR 3/9 worsening R sided PNA. Some improvement in L Lung aeration. ?Concern for pulmonary edema with edema, nodular appearing markings on CXR, elevated BNP. ?History of Alzheimer's dementia with associated dysphagia, POA ?DNR Status ?Hx OSA on CPAP  ?Superficial venous thrombosis - on full dose Lovenox, then changed to heparin gtt. ECHO without RV failure, does  show g2dd. On 3/7 trop-I was 41, BNP 195  ? ? ?11/16/2021: Diagnosis severe pneumonia right-sided.  Very difficult recovery for this pneumonia.  It would require intubation bronchoscopy and possible tracheostomy and LTAC.  Wife appropriately so is not aligned with this type of care with that has high morbidity and high mortality and long duration for recovery.  Highly uncertain course.  Opting for comfort ? ?Plan ?- BiPAP ?- Antibiotics ?-DNR ?- Palliative care/primary service to meet with the patient and family and proceed with comfort care ? ?Critical care medicine will sign off ? ?Best Practice: (right click and "Reselect all SmartList Selections" daily)  ?According to the hospitalist ? ? ? ? ? ?[facility]  ? ?The patient Daniel Ochoa is critically ill with multiple organ systems failure and requires high complexity decision making for assessment and support, frequent evaluation and titration of therapies, application of advanced monitoring technologies and extensive interpretation of multiple databases.  ? ?Critical Care Time devoted to patient care services described in this note is  30  Minutes. This time reflects time of care of this signee Dr Brand Males. This critical care time does not reflect procedure time, or teaching time or supervisory time of PA/NP/Med student/Med Resident etc but could involve care discussion time  ? ? ? ?Dr. Brand Males, M.D., F.C.C.P ?Pulmonary and Critical Care Medicine ?Staff Physician ?[facility] ?[facility] ?Pager: [phone], If no answer or between  15:00h - 7:00h: call [phone] ? ?2021/11/28 ?10:00 AM ? ?

## 2021-12-16 NOTE — Progress Notes (Addendum)
Patient's HR, alternating between brady and tachy, a change in rhythm was noticed.  ? ?12 Lead EKG was assessed and showed Afib with ST & T wave abnormality.  ? ?Patient's PO2 reading in the low 80's. HFNC rate increased to 15L, without relief. He was coached on deep breathing and refuses to wear BiPap.  ? ?Wife at bedside.  ? ?CCM called.  ?

## 2021-12-16 NOTE — Progress Notes (Addendum)
eLink Physician-Brief Progress Note ?Patient Name: Daniel Ochoa ?DOB: 03/15/1941 ?MRN: [id number] ? ? ?Date of Service ? Dec 04, 2021  ?HPI/Events of Note ? Notified of atrial fibrillation.  ? ?Pt desaturated into the 80s when he pulled off BIPAP mask.  BIPAP was placed back and O2 sats now in the 90s.  ? ?HR in the 80s-90s.  ?Pt also with decreased urine output.    ?eICU Interventions ? Mitts in place but patient can still pull the mask off.  ?Precedex gtt ordered to allow tolerance of BIPAP.  ?Pt is on heparin gtt and atrial fibrillation currently rate controlled. Continue to monitor for now.  ?Continue to monitor urine output.  ? ? ? ?Intervention Category ?Intermediate Interventions: Arrhythmia - evaluation and management ? ?Elsie Lincoln ?2021/12/04, 1:36 AM ? ?5:18 AM ?Glucose 336.   ? ?Plan> ?Give humalog 10 units now.  ?Start on long acting insulin today.  ?

## 2021-12-16 NOTE — Progress Notes (Signed)
Wasted 40 ml IV dilaudid into Stericycle, witnessed by Lucia Estelle, RN. ?

## 2021-12-16 NOTE — Progress Notes (Signed)
Pt BiPap was reapplied after wife requested mouth care. Hand mitts reapplied.  ? ?Patient was coached on deep breathing and he verbalizes understanding.  ? ?VSS, will continue to monitor.  ?

## 2021-12-16 NOTE — Progress Notes (Signed)
Patient placed on Nonrebreather, O2 sats are now 85. Continues refusing BiPap. ?

## 2021-12-16 NOTE — Progress Notes (Signed)
Patient's wife called out stating he wanted to be off the Bipap. He was repositioned and placed back on the HFNC at 15 liters.  ? ? ?Patient denies any pain, VSS. Will continue to monitor.  ?

## 2021-12-16 NOTE — Progress Notes (Signed)
PROGRESS NOTE  Daniel Ochoa YPP:[id number] DOB: 07/18/1941 DOA: 12/05/2021 PCP: Donnajean Lopes, MD  HPI/Recap of past 24 hours: [name] is a 81 y.o. male with medical history significant of Daniel Ochoa, Daniel Ochoa off oral anticoagulation, Daniel Ochoa, Daniel Daniel Ochoa, Daniel Ochoa, Daniel Ochoa, Daniel Ochoa, Daniel Ochoa, Daniel Ochoa, Daniel Ochoa, Daniel Ochoa on CPAP, unspecified Daniel Ochoa who was recently discharged from [facility] on 11/17/21 to SNF for physical rehab after admission for Daniel Ochoa, E. coli UTI.  He is returning to [facility] ED on 11/26/2021 from SNF via EMS due to shortness of breath, Daniel hypoxia, productive cough, and generalized weakness.    Work-up revealed left lower lobe HCAP initially, Daniel Daniel Ochoa, elevated troponin, concern for aspiration with Ochoa, possible Daniel Ochoa, bilateral upper extremity Daniel Ochoa, AKI on Daniel Ochoa, and Daniel right sided Ochoa.  Palliative care team, ID, Daniel, and cardiology were consulted.  CT head done on 11/24/2021 revealed Daniel Ochoa.  CT chest without contrast showed Daniel right-sided Ochoa.  On 2021/12/08, patient's wife made decision for comfort care only.  Dec 08, 2021: Patient was seen and examined at his bedside.  He is somnolent, on BiPAP, arouses to voices but minimally interactive.  Assessment/Plan: Principal Problem:   Daniel respiratory Ochoa with hypoxia (HCC) Active Problems:   Daniel Ochoa with complications (HCC)   Essential Daniel Ochoa   Daniel Ochoa on CPAP   Chronic kidney Ochoa, stage IV (Daniel) (HCC)   Daniel Ochoa ([place])   Daniel Ochoa   Class 1 Daniel Ochoa  Persistent Daniel respiratory Ochoa with hypoxia (HCC) secondary to multifocal HCAP with concern for superimposed aspiration Ochoa, POA   Multifocal HCAP with concern for aspiration, POA. Daniel right-sided Ochoa, POA   Daniel left  cerebral Ochoa Daniel Ochoa     Bilateral upper extremity Daniel Ochoa seen on Doppler ultrasound done on 11/23/2021.   Presumptive Daniel Ochoa   Recent Daniel Ochoa .  Elevated troponin suspect demand ischemia in the setting of Daniel hypoxia   Daniel on chronic Daniel Ochoa/concern for developing uremia   Moderate protein calorie Daniel Ochoa   Chronic diastolic CHF   Essential Daniel Ochoa   Prediabetes with hyperglycemia (HCC)      Daniel Ochoa on CPAP      Daniel Ochoa     Chronic anxiety      Class 1 Daniel Ochoa   Physical debility Ambulatory dysfunction  Plan: Comfort care only measures.       CODE STATUS: DNR/comfort care.   Consults: Cardiology, infectious Ochoa, Daniel, palliative care team.   Family Communication: Updated his wife at bedside.   Disposition: Likely hospital death.   Objective: Vitals:   12-08-21 1200 12/08/2021 1215 12/08/2021 1300 2021-12-08 1427  BP: (!) 107/46  (!) 113/53   Pulse: 98 73 63   Resp: (!) 31 (!) 30 (!) 35   Temp:  100.3 F (37.9 C)    TempSrc:  Axillary    SpO2: 92% 92% 92% 93%  Weight:      Height:        Intake/Output Summary (Last 24 hours) at Dec 08, 2021 1500 Last data filed at 2021-12-08 0534 Gross per 24 hour  Intake 930.45 ml  Output 275 ml  Net 655.45 ml   Filed Weights   11/19/2021 1135 12/03/2021 1451  Weight: 111.6 kg 109.6 kg    Exam:  General: 81 y.o. year-old male frail-appearing in no Daniel distress.  He is somnolent and minimally interactive.  On BiPAP. Cardiovascular: Regular rate and rhythm Respiratory:  Rales noted mostly on auscultation of right lungs.  Poor inspiratory effort. Abdomen: Bowel sounds present Musculoskeletal: Trace upper and lower extremity edema.  Data Reviewed: CBC: Recent Labs  Lab 11/20/2021 1050 11/22/21 0315 11/23/21 0306 11/24/21 0303 11/29/21 0241  WBC 17.3* 29.8* 20.7* 22.5* 28.1*  NEUTROABS 14.Ochoa* 26.9* 17.4* 19.3* 24.Ochoa*  HGB 11.6* 12.0*  10.4* 10.6* 11.4*  HCT 36.0* 38.4* 33.0* 33.8* 36.6*  MCV 91.8 93.9 92.7 92.9 93.1  PLT 416* 397 404* 378 622   Basic Metabolic Panel: Recent Labs  Lab 11/28/2021 1050 11/22/21 0315 11/23/21 0306 11/24/21 0303 2021/11/29 0241  NA 137 142 143 143 143  K 3.Ochoa* 4.Ochoa 3.6 3.9 3.8  CL 103 104 107 107 105  CO2 '26 26 26 24 25  '$ GLUCOSE 285* 291* 214* 260* 362*  BUN 82* 78* 91* 101* 112*  CREATININE Ochoa.21* Ochoa.46* Ochoa.65* Ochoa.70* Ochoa.89*  CALCIUM 8.3* 9.0 8.8* 8.6* 9.0  MG  --   --  Ochoa.4 Ochoa.4  --   PHOS  --   --  4.6 3.9  --    GFR: Estimated Creatinine Clearance: 26.9 mL/min (A) (by C-G formula based on SCr of Ochoa.89 mg/dL (H)). Liver Function Tests: Recent Labs  Lab 12/11/2021 1050 11/22/21 0315 11/23/21 0306 11/24/21 0303  AST 16 14* 13* 14*  ALT '16 13 11 11  '$ ALKPHOS 57 51 53 55  BILITOT 0.Ochoa* 0.5 0.3 0.5  PROT 6.Ochoa* 6.8 6.4* 6.0*  ALBUMIN Ochoa.3* 3.Ochoa* Ochoa.8* Ochoa.7*   No results for input(s): LIPASE, AMYLASE in the last 168 hours. No results for input(s): AMMONIA in the last 168 hours. Coagulation Profile: Recent Labs  Lab 11/29/2021 1050  INR 1.1   Cardiac Enzymes: No results for input(s): CKTOTAL, CKMB, CKMBINDEX, TROPONINI in the last 168 hours. BNP (last 3 results) No results for input(s): PROBNP in the last 8760 hours. HbA1C: No results for input(s): HGBA1C in the last 72 hours. CBG: Recent Labs  Lab 11/24/21 2032 11/24/21 2332 11-29-21 0408 11/29/2021 0822 11-29-21 1205  GLUCAP 331* 333* 364* 337* 247*   Lipid Profile: No results for input(s): CHOL, HDL, LDLCALC, TRIG, CHOLHDL, LDLDIRECT in the last 72 hours. Thyroid Function Tests: No results for input(s): TSH, T4TOTAL, FREET4, T3FREE, THYROIDAB in the last 72 hours.  Anemia Panel: No results for input(s): VITAMINB12, FOLATE, FERRITIN, TIBC, IRON, RETICCTPCT in the last 72 hours. Urine analysis:    Component Value Date/Time   COLORURINE STRAW (A) 11/26/2021 1050   APPEARANCEUR CLEAR 11/17/2021 1050   LABSPEC 1.009 11/27/2021  1050   PHURINE 5.0 12/07/2021 1050   GLUCOSEU 50 (A) 11/16/2021 1050   HGBUR NEGATIVE 11/19/2021 1050   BILIRUBINUR NEGATIVE 12/03/2021 1050   KETONESUR NEGATIVE 12/09/2021 1050   PROTEINUR NEGATIVE 12/12/2021 1050   UROBILINOGEN 0.Ochoa 04/13/2014 2334   NITRITE NEGATIVE 11/22/2021 1050   LEUKOCYTESUR NEGATIVE 12/04/2021 1050   Sepsis Labs: '@LABRCNTIP'$ (procalcitonin:4,lacticidven:4)  ) Recent Results (from the past 240 hour(s))  Blood Culture (routine x Ochoa)     Status: None (Preliminary result)   Collection Time: 11/29/2021 10:50 AM   Specimen: BLOOD  Result Value Ref Range Status   Specimen Description   Final    BLOOD BLOOD LEFT WRIST Performed at [facility], [place] [address], [place], [place] [address]    Special Requests   Final    BOTTLES DRAWN AEROBIC AND ANAEROBIC Blood Culture results may not be optimal due to an excessive volume of blood received in culture bottles Performed at [facility], [place] [address] [place], [place] [address]    Culture   Final    NO GROWTH 4 DAYS Performed at [facility], [place] [address], [place], [place] [address]    Report Status PENDING  Incomplete  Resp Panel by RT-PCR (Flu A&B, Covid) Nasopharyngeal Swab     Status: None   Collection Time: 11/30/2021 10:51 AM   Specimen: Nasopharyngeal Swab; Nasopharyngeal(NP) swabs in vial transport medium  Result Value Ref Range Status   SARS Coronavirus Ochoa by RT PCR NEGATIVE NEGATIVE Final    Comment: (NOTE) SARS-CoV-Ochoa target nucleic acids are NOT DETECTED.  The SARS-CoV-Ochoa RNA is generally detectable in upper respiratory specimens during the Daniel phase of infection. The lowest concentration of SARS-CoV-Ochoa viral copies this assay can detect is 138 copies/mL. A negative result does not preclude SARS-Cov-Ochoa infection and should not be used as the sole basis for treatment or other patient management decisions. A negative result may occur with  improper specimen  collection/handling, submission of specimen other than nasopharyngeal swab, presence of viral mutation(s) within the areas targeted by this assay, and inadequate number of viral copies(<138 copies/mL). A negative result must be combined with clinical observations, patient history, and epidemiological information. The expected result is Negative.  Fact Sheet for Patients:  EntrepreneurPulse.com.au  Fact Sheet for Healthcare Providers:  IncredibleEmployment.be  This test is no t yet approved or cleared by the [place] FDA and  has been authorized for detection and/or diagnosis of SARS-CoV-Ochoa by FDA under an Emergency Use Authorization (EUA). This EUA will remain  in effect (meaning this test can be used) for the duration of the COVID-19 declaration under Section 564(b)(1) of the Act, 21 U.S.C.section 360bbb-3(b)(1), unless the authorization is terminated  or revoked sooner.       Influenza A by PCR NEGATIVE NEGATIVE Final   Influenza B by PCR NEGATIVE NEGATIVE Final    Comment: (NOTE) The Xpert Xpress SARS-CoV-Ochoa/FLU/RSV plus assay is intended as an aid in the diagnosis of influenza from Nasopharyngeal swab specimens and should not be used as a sole basis for treatment. Nasal washings and aspirates are unacceptable for Xpert Xpress SARS-CoV-Ochoa/FLU/RSV testing.  Fact Sheet for Patients: EntrepreneurPulse.com.au  Fact Sheet for Healthcare Providers: IncredibleEmployment.be  This test is not yet approved or cleared by the [place] FDA and has been authorized for detection and/or diagnosis of SARS-CoV-Ochoa by FDA under an Emergency Use Authorization (EUA). This EUA will remain in effect (meaning this test can be used) for the duration of the COVID-19 declaration under Section 564(b)(1) of the Act, 21 U.S.C. section 360bbb-3(b)(1), unless the authorization is terminated or revoked.  Performed at [facility], [place] [address], [place], [place] [address]   Blood Culture (routine x Ochoa)     Status: None (Preliminary result)   Collection Time: 12/10/2021 10:55 AM   Specimen: BLOOD  Result Value Ref Range Status   Specimen Description   Final    BLOOD LEFT ANTECUBITAL Performed at [place] [address], [place], [place] [address]    Special Requests   Final    BOTTLES DRAWN AEROBIC AND ANAEROBIC Blood Culture results may not be optimal due to an excessive volume of blood received in culture bottles Performed at [place] [address], [place], [place] [address]    Culture   Final    NO GROWTH 4 DAYS Performed at [facility], [place] [address] [place], [place]  [address]    Report Status PENDING  Incomplete  MRSA Next Gen by PCR, Nasal     Status: None   Collection Time: 11/28/2021  Ochoa:05 PM   Specimen: Nasal Mucosa; Nasal Swab  Result Value Ref Range Status   MRSA by PCR Next Gen NOT DETECTED NOT DETECTED Final    Comment: (NOTE) The GeneXpert MRSA Assay (FDA approved for NASAL specimens only), is one component of a comprehensive MRSA colonization surveillance program. It is not intended to diagnose MRSA infection nor to guide or monitor treatment for MRSA infections. Test performance is not FDA approved in patients less than 41 years old. Performed at [facility], [place] [address] [place], [place] [address]       Studies: CT HEAD WO CONTRAST (5MM)  Result Date: 11/24/2021 CLINICAL DATA:  Neuro deficit, Daniel, stroke suspected EXAM: CT HEAD WITHOUT CONTRAST TECHNIQUE: Contiguous axial images were obtained from the base of the skull through the vertex without intravenous contrast. RADIATION DOSE REDUCTION: This exam was performed according to the departmental dose-optimization program which includes automated exposure control, adjustment of the mA and/or kV according to patient size and/or use of  iterative reconstruction technique. COMPARISON:  CT November 15, 2021. FINDINGS: Motion limited study. Brain: No evidence of Daniel infarction, hemorrhage, hydrocephalus, extra-axial collection or mass lesion/mass effect. Daniel left cerebellar infarct. Patchy white matter hypoattenuation, nonspecific but compatible with chronic microvascular ischemic Ochoa. Vascular: Calcific intracranial atherosclerosis. No hyperdense vessel identified. Skull: No evidence of Daniel fracture. Frontal calvarium is incompletely imaged. Prior suboccipital craniectomy. Sinuses/Orbits: The visualized sinuses are clear. Unremarkable orbits. No mastoid effusions. Other: No mastoid effusions. IMPRESSION: 1. No evidence of Daniel intracranial abnormality on this motion limited study. Ochoa. Daniel left cerebellar infarct, chronic microvascular ischemic Ochoa and prior suboccipital craniectomy. Electronically Signed   By: Margaretha Sheffield M.D.   On: 11/24/2021 18:36   CT CHEST WO CONTRAST  Result Date: 11/24/2021 CLINICAL DATA:  Ochoa EXAM: CT CHEST WITHOUT CONTRAST TECHNIQUE: Multidetector CT imaging of the chest was performed following the standard protocol without IV contrast. RADIATION DOSE REDUCTION: This exam was performed according to the departmental dose-optimization program which includes automated exposure control, adjustment of the mA and/or kV according to patient size and/or use of iterative reconstruction technique. COMPARISON:  Chest radiographs done on 11/23/2021 FINDINGS: Cardiovascular: Coronary artery calcifications are seen. There is dense calcification in the mitral annulus. There are scattered calcifications in the thoracic aorta. There is ectasia of main Daniel artery measuring 3.8 cm suggesting Daniel arterial Daniel Ochoa. Mediastinum/Nodes: No significant lymphadenopathy seen. Surgical clips are seen in the right thyroid bed. Lungs/Pleura: There is large infiltrate in the right lower lobe. There are patchy  alveolar infiltrates in the right upper lobe and right middle lobe. There are small linear subpleural densities in the left lower lung fields. Breathing motion artifacts limit evaluation of the left lung. There is possible bilateral pleural effusions, more so on the right side. Daniel beam hardening artifacts in the lower chest and upper abdomen limit evaluation of pleural effusion. There is no pneumothorax. Upper Abdomen: Unremarkable. Musculoskeletal: Diffuse calcifications are seen in the anterior spinal ligament. Please correlate for possible ankylosing spondylitis. IMPRESSION: There is large infiltrate in the right lower lobe. There are patchy alveolar infiltrates in the right upper lobe and right middle lobe. Findings suggest multifocal Ochoa. Small patchy infiltrate is seen in the posterior left lower lung fields, possibly residual atelectasis/Ochoa. Evaluation for pleural effusions is limited by Daniel beam hardening artifacts  possibly caused by patient's upper extremities within the CT gantry. As far as seen there is no evidence of large pleural effusions. There is ectasia of main Daniel artery suggesting Daniel arterial Daniel Ochoa. Coronary artery calcifications are seen. Other findings as described in the body of the report. Electronically Signed   By: Elmer Picker M.D.   On: 11/24/2021 18:42   DG Chest Port 1 View  Result Date: December 13, 2021 CLINICAL DATA:  Respiratory Ochoa with hypoxia.  Ochoa. EXAM: PORTABLE CHEST 1 VIEW COMPARISON:  11/23/2021 FINDINGS: Airspace Ochoa in the right lung consistent with history of Ochoa. There is progression since the previous study. Left lung is clear. Probable small right pleural effusion. Daniel size is normal for technique. Surgical clips in the base of the neck. IMPRESSION: Increasing airspace infiltration in the right lung with small right pleural effusion, likely Ochoa. Electronically Signed   By: Lucienne Capers M.D.    On: 13-Dec-2021 02:38    Scheduled Meds:  acetylcysteine  Ochoa mL Nebulization QID   albuterol  Ochoa.5 mg Nebulization TID   amLODipine  10 mg Oral Daily   apixaban  5 mg Oral BID   vitamin C  500 mg Oral Daily   Chlorhexidine Gluconate Cloth  6 each Topical Daily   famotidine  10 mg Oral QHS   feeding supplement (NEPRO CARB STEADY)  237 mL Oral BID BM   insulin aspart  0-20 Units Subcutaneous TID WC   insulin aspart  0-5 Units Subcutaneous QHS   insulin aspart  3 Units Subcutaneous TID WC   levothyroxine  200 mcg Oral Once per day on Mon Tue Wed Thu Fri Sat   [START ON 11/26/2021] levothyroxine  300 mcg Oral Once per day on Sun   mouth rinse  15 mL Mouth Rinse BID   multivitamin  1 tablet Oral QHS    Continuous Infusions:  sodium chloride 10 mL/hr at 11/22/21 1010   sodium chloride 10 mL/hr at 11/24/21 1053   HYDROmorphone 0.5 mg/hr (2021-12-13 1304)   meropenem (MERREM) IV Stopped (12/13/2021 1109)     LOS: 4 days     Kayleen Memos, MD [facility] Pager [phone]  If 7PM-7AM, please contact night-coverage [email or web address] Password [email or web address] 12-13-2021, 3:00 PM

## 2021-12-16 NOTE — Progress Notes (Signed)
ANTICOAGULATION CONSULT NOTE ? ?Pharmacy Consult for IV Heparin>apixaban ?Indication: Suspected PE ? ?Allergies  ?Allergen Reactions  ? Statins Other (See Comments)  ?  Elevated CK level  ? Lisinopril   ?  Other reaction(s): stopped by pulmonology  to help with breathing  ? Latex Rash  ?  Severe blistery rash  ? ? ?Patient Measurements: ?Height: '6\' 1"'$  (185.4 cm) ?Weight: 109.6 kg (241 lb 10 oz) ?IBW/kg (Calculated) : 79.9 ?Heparin Dosing Weight: 102.8 kg ? ?Vital Signs: ?Temp: 100.5 ?F (38.1 ?C) (03/11 0419) ?Temp Source: Axillary (03/11 0419) ?BP: 131/45 (03/11 0425) ?Pulse Rate: 100 (03/11 0425) ? ?Labs: ?Recent Labs  ?  11/23/21 ?0306 11/24/21 ?0303 11/24/21 ?4431 11/24/21 ?2033 14-Dec-2021 ?0241  ?HGB 10.4* 10.6*  --   --  11.4*  ?HCT 33.0* 33.8*  --   --  36.6*  ?PLT 404* 378  --   --  351  ?HEPARINUNFRC  --   --  <0.10* 0.13*  --   ?CREATININE 2.65* 2.70*  --   --  2.89*  ? ? ? ?Estimated Creatinine Clearance: 26.9 mL/min (A) (by C-G formula based on SCr of 2.89 mg/dL (H)). ? ? ?Medical History: ?Past Medical History:  ?Diagnosis Date  ? Acute deep vein thrombosis (DVT) of tibial vein of right lower extremity ([place]) 04/13/2014  ? Acute pulmonary embolism ([place]) 04/15/2014  ? Arthritis   ? Chronic fatigue   ? Chronic kidney disease, stage IV (severe) ([place])   ? Complication of anesthesia   ? one surgery took 4 people to hold him down 2000  ? Constipation   ? Diabetic peripheral neuropathy ([place]) 10/26/2015  ? DM (diabetes mellitus), type 2, uncontrolled, with renal complications   ? [facility]  ? DVT (deep venous thrombosis) ([place])   ? RLE DVT with intermediate risk VQ scan for PE 03/2014  ? Dyspnea   ? Essential hypertension   ? Fibromyalgia   ? Hypothyroidism   ? Hypoxia 04/13/2014  ? Neuropathy   ? Obesity   ? OSA on CPAP   ? Pulmonary embolism ([place]) 04/13/2014  ? VQ 04/13/14 intermediate assoc with R DVT > on coumadin since Echo 04/14/14 > PA peak pressure: 89 mm Hg (S).   ? ? ?Medications:  ?Prior to IV heparin, pt  was on Lovenox 110 mg subq every 24 hours.  Last Lovenox dose was 110 mg on 3/9 at 0511 ?IV Heparin infusion initiated on 3/10 at 0125 at 1650 units/hr ? ?Assessment: ?6 yoM presented on 3/7 with SOB, HCAP, concern for aspiration.  PMH of anticoagulation for history of DVT/PE, but no longer taking any oral anticoagulation (Warfarin x6 months in 2015).  He was started on Lovenox '40mg'$  daily for VTE prophylaxis on admission.  Ddimer 4.7. Doppler ultrasound 3/9 consistent with acute superficial vein thrombosis involving left cephalic vein as well as right basilic vein.  Pharmacy was initially consulted to dose Lovenox for possible PE on 3/8, and then consulted to transition to Heparin on 3/9. now consulted to dose apixaban ? ? ?Goal of Therapy:  ?Heparin level 0.3-0.7 units/ml ?Monitor platelets by anticoagulation protocol: Yes ?  ?Plan:  ?-stop heparin drip, start apixaban '5mg'$  po twice daily ?- pharmacy to provide education ? ?Dolly Rias RPh ?12-14-21, 5:26 AM ? ? ? ?

## 2021-12-16 NOTE — Progress Notes (Signed)
Chaplain responded to request from RN for family support.  Spouse Romie Minus in the room and sitting off to the side.  She welcomed the visit.  Romie Minus share about their almost 60 years together and the places they have lived and the adventures they have had.  Patient has deal with a number of health issues over the years and they have had the talk about what he would want at the end.  Romie Minus knows moving to comfort care is the right choice, but it is still hard.  She has worked hard in her life to care for him, but has no bitterness because of the love she has for him.  She knows eventually going home alone will be hard.  They identify as Christians.  They have one son in [place] and 2 grandchildren.  Until today it has been 8 years since they have spoken. Romie Minus called and updated him on the patient's condition.  Chaplain offered ways for Romie Minus to be with him and talk to him.  Chaplain prayed at bedside.  Chaplain available as needed. ?Chaplain Katherene Ponto ? ? ? Dec 10, 2021 1142  ?Clinical Encounter Type  ?Visited With Patient and family together;Health care provider  ?Visit Type Spiritual support;Critical Care  ?Referral From Nurse  ?Consult/Referral To Chaplain  ?Spiritual Encounters  ?Spiritual Needs Emotional;Prayer  ?Stress Factors  ?Family Stress Factors Family relationships;Health changes  ? ? ?

## 2021-12-16 NOTE — Progress Notes (Signed)
Patient required significant coaching to take his morning medication. He was able to pass them without any signs of aspiration.  ? ?Wife at bedside, all questions answered.   ?

## 2021-12-16 NOTE — Progress Notes (Signed)
? ?                                                                                                                                                     ?                                                   ?Daily Progress Note  ? ?Patient Name: Daniel Ochoa       Date: 11/26/2021 ?DOB: 05/21/1941  Age: 81 y.o. MRN#: [id number] ?Attending Physician: No att. providers found ?Primary Care Physician: [name], [name], MD ?Admit Date: 12/14/2021 ? ?Reason for Consultation/Follow-up: Establishing goals of care ? ?Subjective: ?Received notification that patient's wife has been inquiring about transition to comfort care. ? ?I met today with Daniel Ochoa.  We discussed clinical course as well as wishes moving forward in regard to care plan this hospitalization discussed.  We discussed difference between a aggressive medical intervention path and a palliative, comfort focused care path.  Values and goals of care important to patient and family were attempted to be elicited. ?  ?She reports that she thinks that Daniel Ochoa is reaching a point where he is suffering without realistic chance of improvement.  With this in mind, she states that his wish would be to transition to comfort care moving forward. ? ?Length of Stay: 4 ? ?Physical Exam      ?General: Somnolent ?Heart: Tachycardic  ?lungs: BiPAP in place.  Coarse.   ?Abdomen: Soft, nontender, nondistended, positive bowel sounds.   ?Ext: Lower extremity and upper extremity edema ?Skin: Warm and dry ? ? ?Vital Signs: BP (!) 119/43   Pulse (!) 51   Temp 97.6 ?F (36.4 ?C)   Resp (!) 3   Ht 6' 1" (1.854 m)   Wt 109.6 kg   SpO2 (!) 89%   BMI 31.88 kg/m?  ?SpO2: SpO2: (!) 89 % ?O2 Device: O2 Device: Room Air ?O2 Flow Rate: O2 Flow Rate (L/min): 8 L/min ? ?Intake/output summary:  ?Intake/Output Summary (Last 24 hours) at 11/26/2021 0952 ?Last data filed at 11/19/2021 1950 ?Gross per 24 hour  ?Intake 10.75 ml  ?Output --  ?Net 10.75 ml  ? ?LBM: Last BM Date :  (PTA) ?Baseline  Weight: Weight: 111.6 kg ?Most recent weight: Weight: 109.6 kg ? ?     ?Palliative Assessment/Data: ? ? ? ?Flowsheet Rows   ? ?Flowsheet Row Most Recent Value  ?Intake Tab   ?Referral Department Hospitalist  ?Unit at Time of Referral Intermediate Care Unit  ?Palliative Care Primary Diagnosis Sepsis/Infectious Disease  ?Date Notified 11/22/21  ?Palliative Care Type New Palliative care  ?Reason for referral Clarify Goals of Care  ?Date   of Admission 12/11/2021  ?Date first seen by Palliative Care 11/23/21  ?# of days Palliative referral response time 1 Day(s)  ?# of days IP prior to Palliative referral 1  ?Clinical Assessment   ?Palliative Performance Scale Score 30%  ?Psychosocial & Spiritual Assessment   ?Palliative Care Outcomes   ?Patient/Family meeting held? Yes  ?Who was at the meeting? Patient, wife  ? ?  ? ? ?Patient Active Problem List  ? Diagnosis Date Noted  ? Acute respiratory failure with hypoxia ([place]) 11/29/2021  ? Class 1 obesity 12/11/2021  ? TIA (transient ischemic attack) 11/10/2021  ? Constipation 11/03/2021  ? Body mass index (BMI) 36.0-36.9, adult 11/03/2021  ? Age-related cataract 11/03/2021  ? Degeneration of lumbar intervertebral disc 11/03/2021  ? Diabetic retinopathy associated with type 2 diabetes mellitus ([place]) 11/03/2021  ? Diastolic dysfunction 24/23/5361  ? ESRD (end stage renal disease) ([place]) 08/07/2021  ? Low back pain 12/17/2020  ? Weakness 04/11/2020  ? Paroxysmal atrial fibrillation ([place]) 02/22/2020  ? Right foot drop 02/22/2020  ? Shoulder joint pain 02/22/2020  ? Sedative, hypnotic or anxiolytic dependence, uncomplicated ([place]) 44/31/5400  ? Lipoprotein deficiency disorder 02/17/2019  ? Muscle weakness 09/30/2018  ? Long term (current) use of insulin ([place]) 01/23/2018  ? Encounter for general adult medical examination without abnormal findings 01/07/2018  ? Alzheimer's disease ([place]) 04/23/2017  ? Diabetic renal disease ([place]) 04/23/2017  ? Hypothyroidism 04/23/2017  ? Morbid obesity  ([place]) 04/23/2017  ? Neck pain 04/23/2017  ? Peripheral venous insufficiency 04/23/2017  ? Retinal disorder 04/23/2017  ? Diabetic peripheral neuropathy ([place]) 10/26/2015  ? Dyspnea 07/23/2014  ? Acute pulmonary embolism ([place]) 04/15/2014  ? Hypoxia 04/13/2014  ? Acute deep vein thrombosis (DVT) of tibial vein of right lower extremity ([place]) 04/13/2014  ? Pulmonary embolism ([place]) 04/13/2014  ? Diabetes mellitus type 2 with complications ([place])   ? Essential hypertension   ? OSA on CPAP   ? Chronic kidney disease, stage IV (severe) ([place])   ? ? ?Palliative Care Assessment & Plan  ? ?Patient Profile: ?81 y.o. male  with past medical history of A-fib, remote DVT/PE, hypothyroidism, OSA on CPAP, hypertension, diabetes, CKD, AV fistula (not on HD), TIA, dementia admitted on 11/17/2021 with respiratory failure secondary to multifocal pneumonia, HCAP, concern for aspiration, concern for PE.  Has been dependent on BiPAP to maintain sats. ? ?Recommendations/Plan: ?DNR/DNI ?Overall, his wife believes that Daniel Ochoa's desire would be to focus on his comfort moving forward.  She is going home now to take care of their dog but then is going to return to the hospital.  We discussed plan when she comes back will be to remove BiPAP and focus on comfort from that point forward. ?Pain/shortness of breath: We will plan for continuous infusion of Dilaudid to ensure comfort moving forward ?Anxiety: Ativan as needed ?Secretions: Robinul as needed ?Anticipate his prognosis will likely be limited number of hours to possibly days at best. ? ?Goals of Care and Additional Recommendations: ?Limitations on Scope of Treatment: Full Comfort Care ? ?Code Status: ?Code Status History   ? ? Date Active Date Inactive Code Status Order ID Comments User Context  ? 12/17/21 1523 11/26/2021 0355 DNR [id number]  Micheline Rough, MD Inpatient  ? 11/22/2021 1241 12-17-2021 1523 DNR [id number]  Kayleen Memos, DO Inpatient  ? 11/28/2021 1321 11/22/2021 1241 Full Code [id number]   Reubin Milan, MD ED  ? 11/10/2021 1715 11/18/2021 0837 Full Code [id number]  Little Ishikawa, MD ED  ?  04/13/2014 1827 04/16/2014 1842 Full Code [id number]  Modena Jansky, MD Inpatient  ? ? Questions for Most Recent Historical Code Status (Order [id number])   ? ? Question Answer  ? In the event of cardiac or respiratory ARREST Do not call a ?code blue?  ? In the event of cardiac or respiratory ARREST Do not perform Intubation, CPR, defibrillation or ACLS  ? In the event of cardiac or respiratory ARREST Use medication by any route, position, wound care, and other measures to relive pain and suffering. May use oxygen, suction and manual treatment of airway obstruction as needed for comfort.  ? ?  ?  ? ?  ? ?  ?  ? ?Advance Directive Documentation   ? ?Flowsheet Row Most Recent Value  ?Type of Advance Directive Healthcare Power of Attorney  ?Pre-existing out of facility DNR order (yellow form or pink MOST form) --  ?"MOST" Form in Place? --  ? ?  ? ? ?Prognosis: ? Hours - Days ? ?Discharge Planning: ?Anticipated Hospital Death ? ?Care plan was discussed with wife, RN, Dr. Nevada Crane, Dr. Chase Caller ? ?Thank you for allowing the Palliative Medicine Team to assist in the care of this patient. ? ?Micheline Rough, MD ? ?Please contact Palliative Medicine Team phone at [phone] for questions and concerns.  ? ? ? ? ? ?

## 2021-12-16 NOTE — Progress Notes (Signed)
Death Pronouncement Note ? ?I was present with patient, his wife, and RN as his breathing continued to space out and he stopped breathing.  Asystole noted on monitor. ? ?Exam:  ?Pupils fixed and dilated. ?Does not respond to noxious stimuli. ?No corneal reflex. ?No palpable central or peripheral pulses. ?No auscultatable breath or heart sounds for greater than one minute. ? ?Time of death: 1950 ? ?Support provided to wife at the bedside. ? ?RN has information regarding funeral home. ? ?Micheline Rough, MD ?[facility] Team ?[phone] ? ? ?

## 2021-12-16 DEATH — deceased

## 2022-01-12 ENCOUNTER — Ambulatory Visit: Payer: Medicare Other | Admitting: Nurse Practitioner

## 2022-02-02 ENCOUNTER — Ambulatory Visit: Payer: Medicare Other | Admitting: Podiatry

## 2022-10-09 IMAGING — CT CT HEAD W/O CM
3 of 5 series · 15 of 47 positions shown, 18 images · non-contrast
Comparison: CT November 15, 2021.

CLINICAL DATA: Neuro deficit, acute, stroke suspected



[Series 5: thorax · axial · 0.80mm/px · z∈[+811,+1019]mm · 9 of 122 slices shown, 12 images]
[im 9/122  brain]
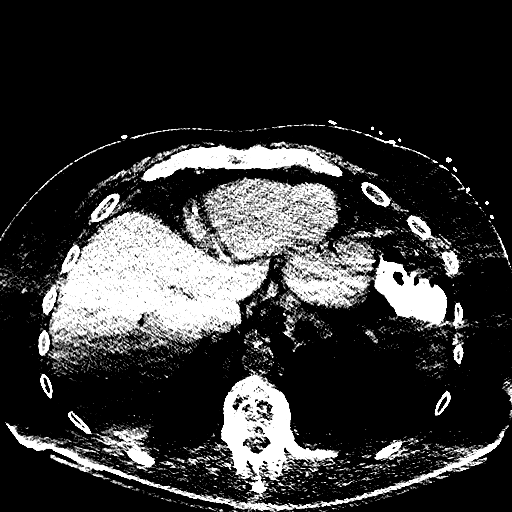
[im 9/122  bone]
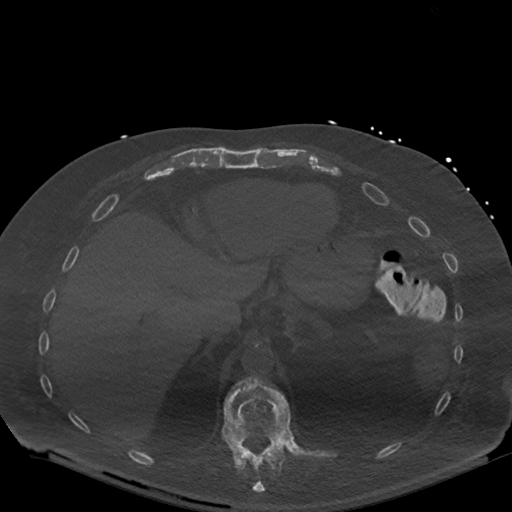
[im 25/122  brain]
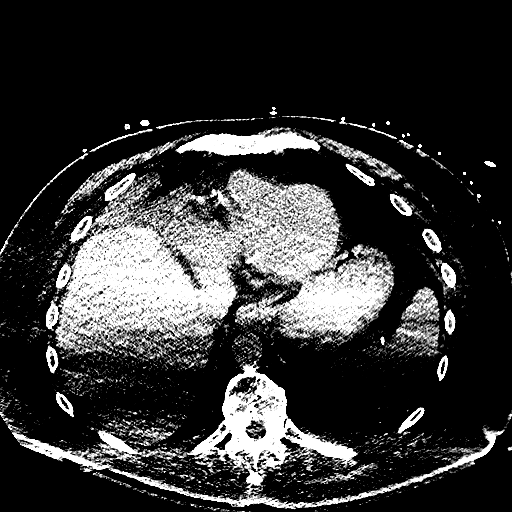
[im 33/122  brain]
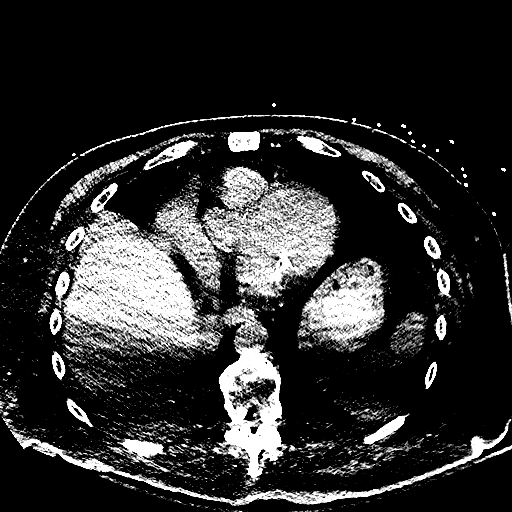
[im 49/122  brain]
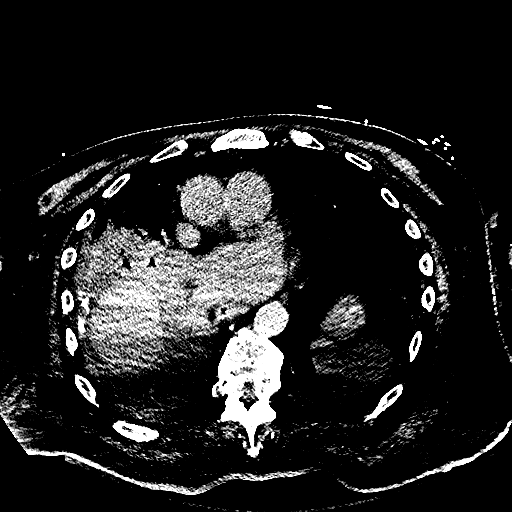
[im 65/122  brain]
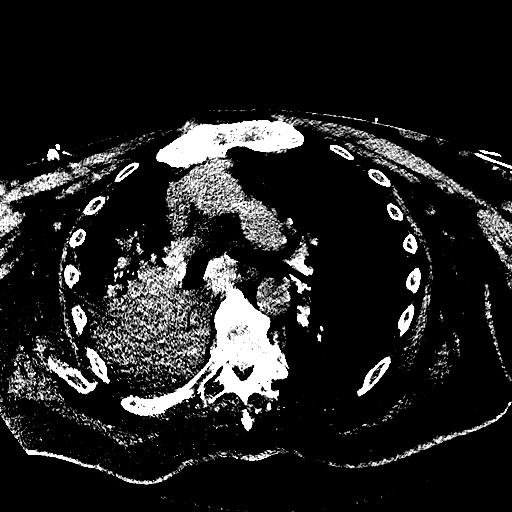
[im 65/122  bone]
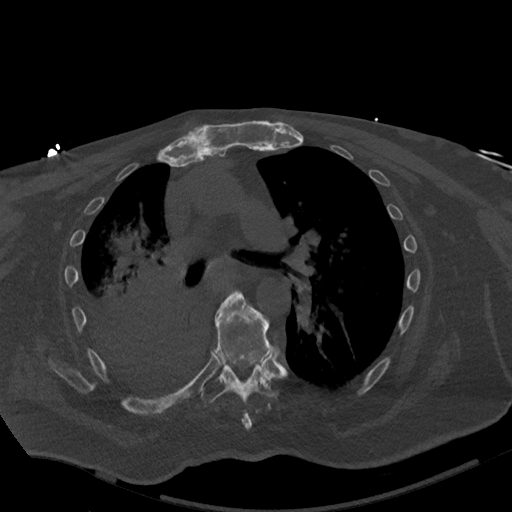
[im 73/122  brain]
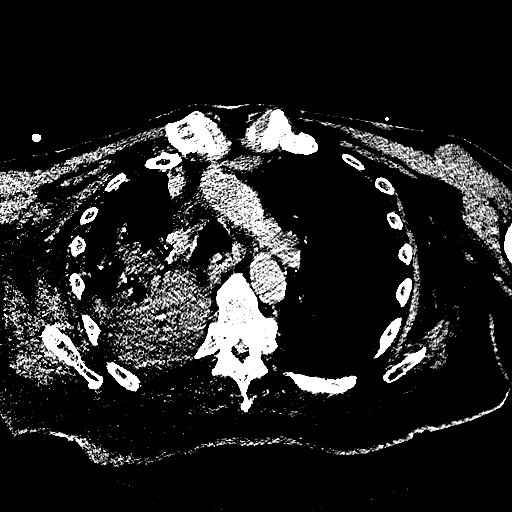
[im 89/122  brain]
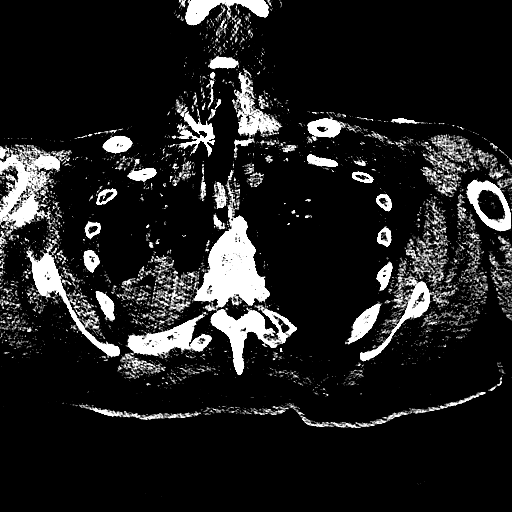
[im 97/122  brain]
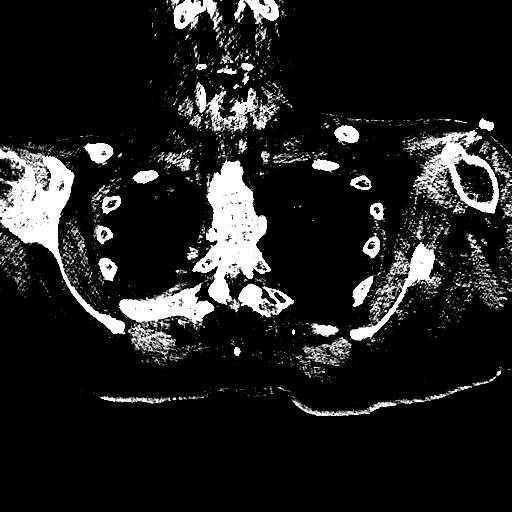
[im 113/122  brain]
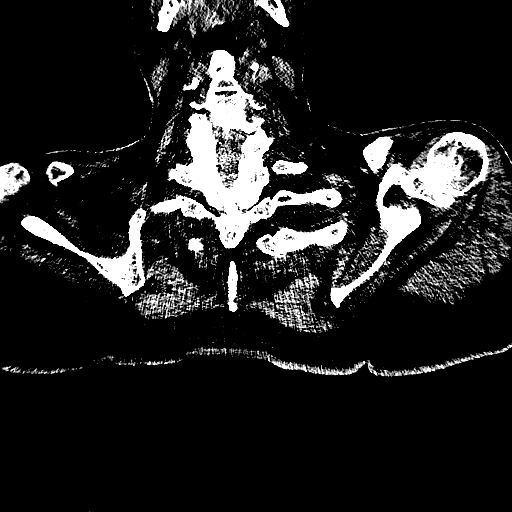
[im 113/122  bone]
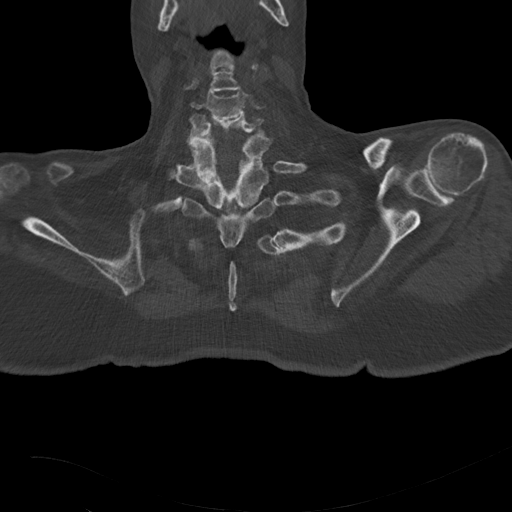

[Series 8: coronal soft tissue · coronal · 0.31mm/px · 3 of 67 slices shown]
[im 23/67  brain]
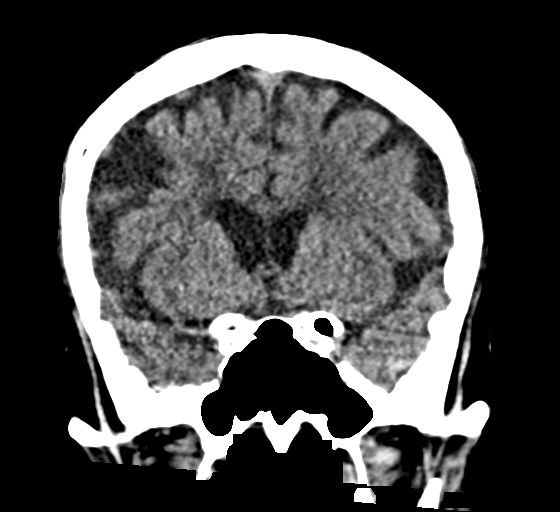
[im 30/67  brain]
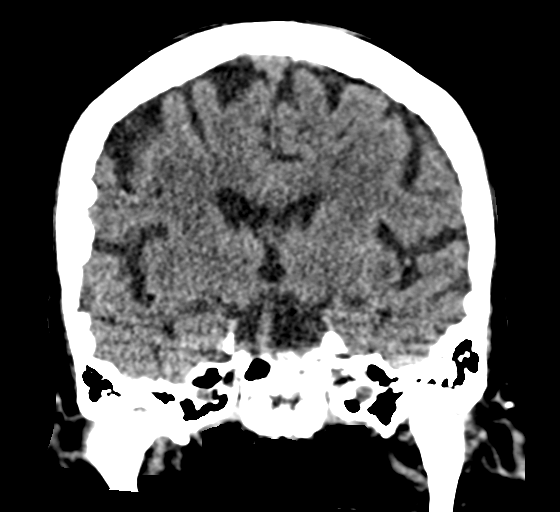
[im 37/67  brain]
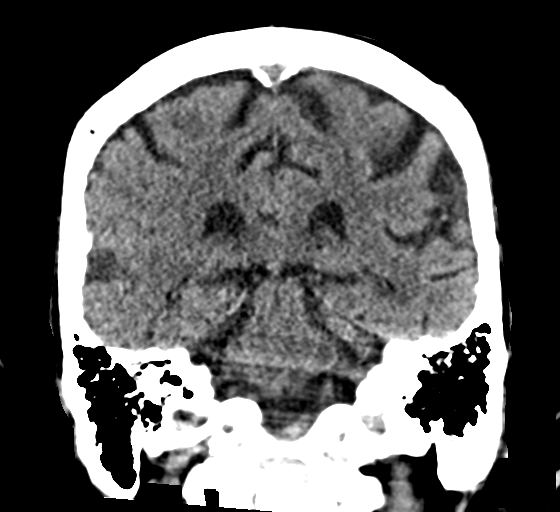

[Series 9: sagittal soft tissue · sagittal · 0.37mm/px · 3 of 54 slices shown]
[im 18/54  brain]
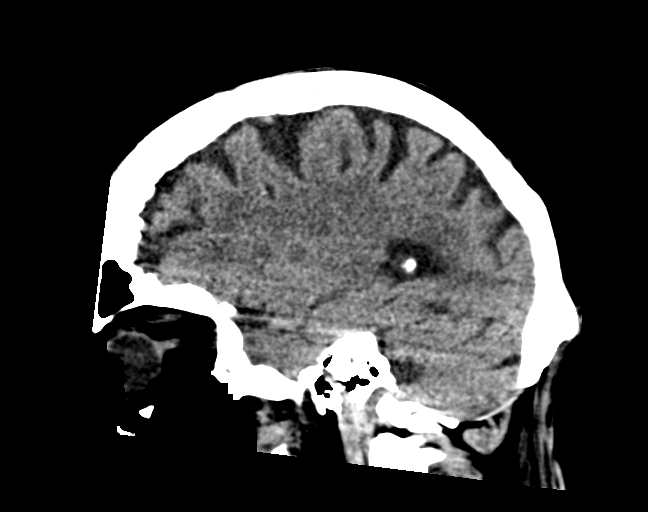
[im 27/54  brain]
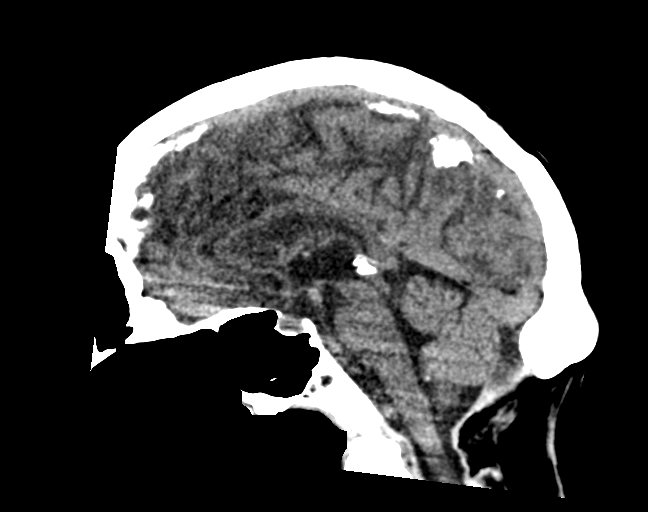
[im 36/54  brain]
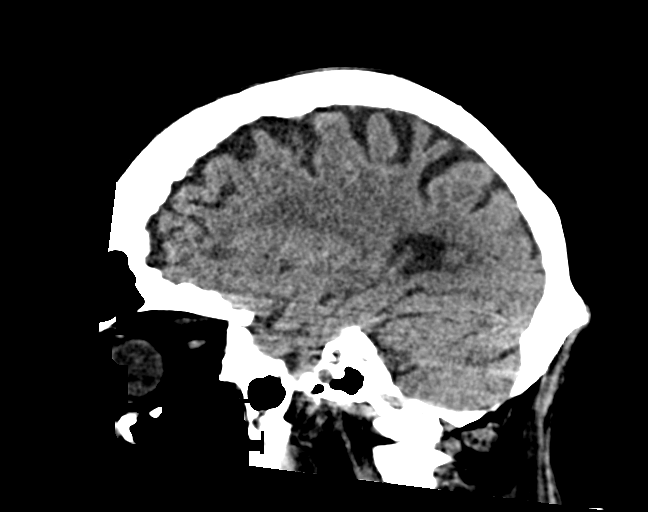

[15 of 47 positions shown; findings below may reference images not displayed]

FINDINGS: Motion limited study.

Brain: No evidence of acute infarction, hemorrhage, hydrocephalus,
extra-axial collection or mass lesion/mass effect. Remote left
cerebellar infarct. Patchy white matter hypoattenuation, nonspecific
but compatible with chronic microvascular ischemic disease.

Vascular: Calcific intracranial atherosclerosis. No hyperdense
vessel identified.

Skull: No evidence of acute fracture. Frontal calvarium is
incompletely imaged. Prior suboccipital craniectomy.

Sinuses/Orbits: The visualized sinuses are clear. Unremarkable
orbits. No mastoid effusions.

Other: No mastoid effusions.
IMPRESSION: 1. No evidence of acute intracranial abnormality on this motion
limited study.
2. Remote left cerebellar infarct, chronic microvascular ischemic
disease and prior suboccipital craniectomy.

## 2022-10-10 IMAGING — DX DG CHEST 1V PORT
1 series · 1 of 1 positions shown · non-contrast
Comparison: 11/23/2021

CLINICAL DATA: Respiratory failure with hypoxia.  Pneumonia.

EXAM:
PORTABLE CHEST 1 VIEW

[chest ap]
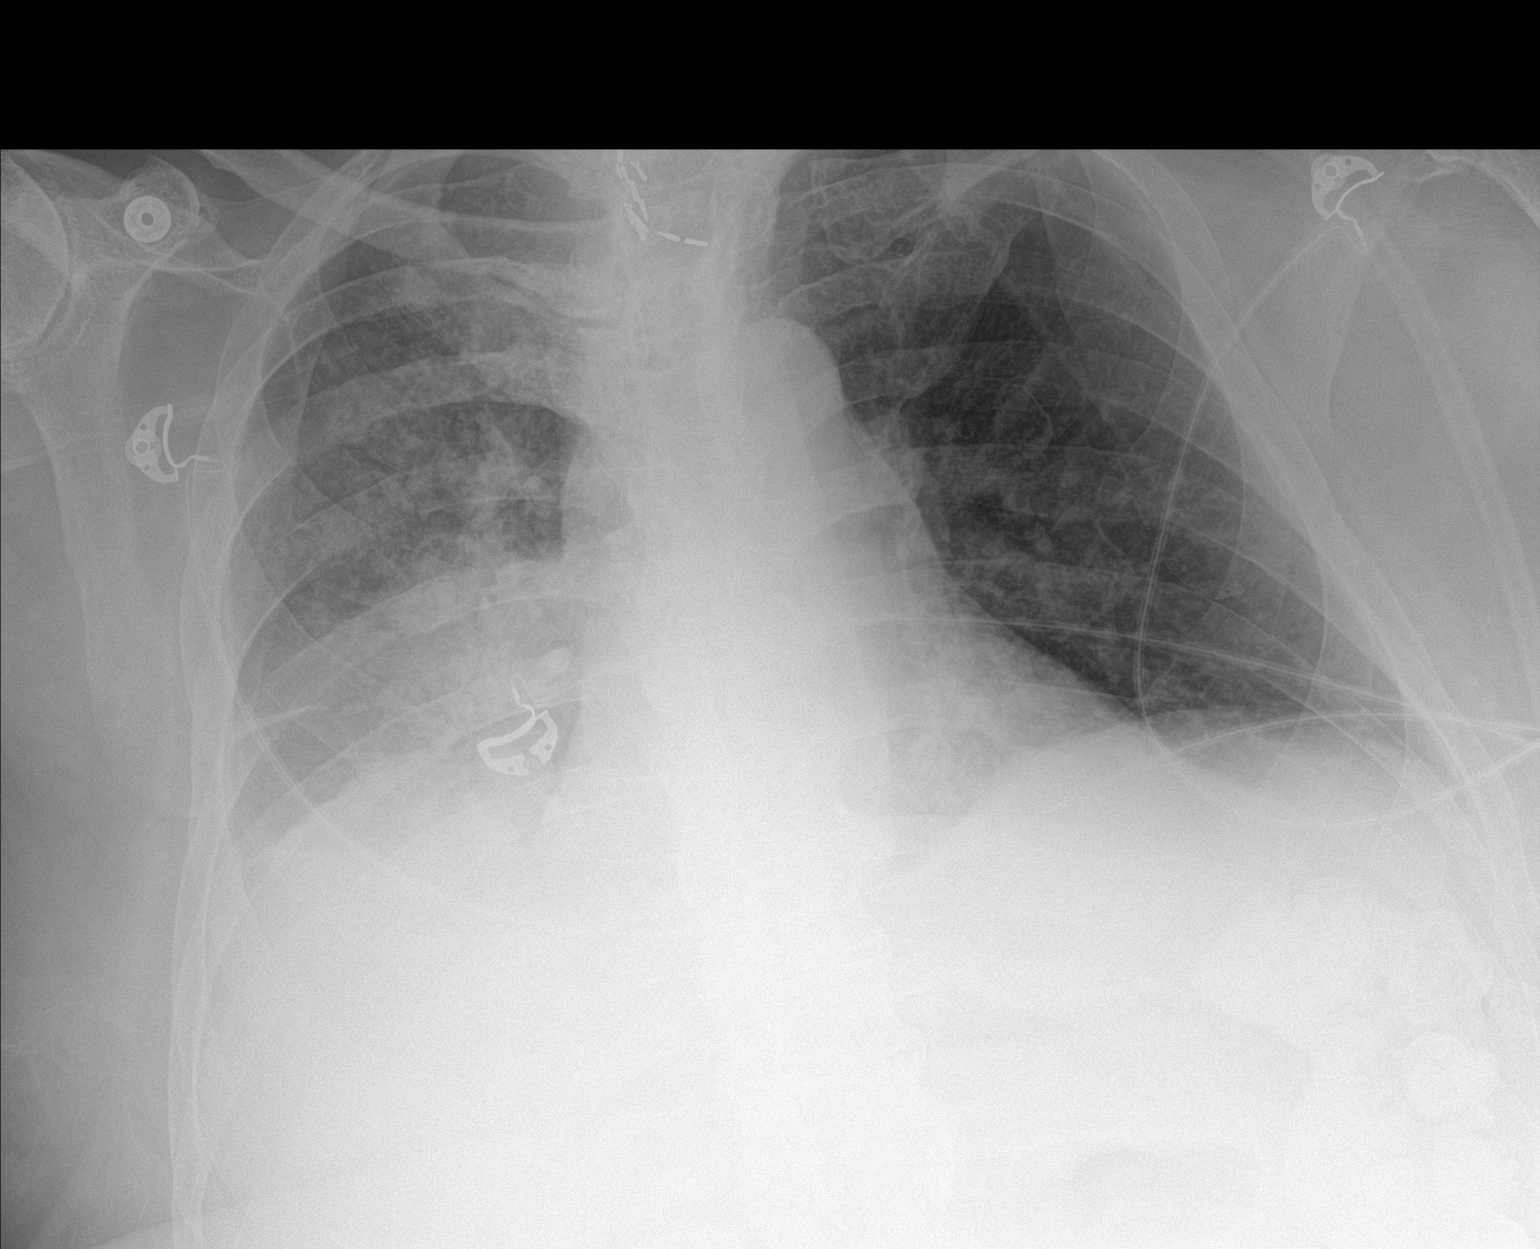

[1 of 1 positions shown; findings below may reference images not displayed]

FINDINGS: Airspace disease in the right lung consistent with history of
pneumonia. There is progression since the previous study. Left lung
is clear. Probable small right pleural effusion. Heart size is
normal for technique. Surgical clips in the base of the neck.
IMPRESSION: Increasing airspace infiltration in the right lung with small right
pleural effusion, likely pneumonia.
